# Patient Record
Sex: Female | Born: 1935
Health system: Southern US, Community
[De-identification: ages and names within clinical notes are randomized; demographics above are authoritative.]

## PROBLEM LIST (undated history)

## (undated) DIAGNOSIS — I739 Peripheral vascular disease, unspecified: Secondary | ICD-10-CM

## (undated) DIAGNOSIS — I509 Heart failure, unspecified: Secondary | ICD-10-CM

## (undated) DIAGNOSIS — I499 Cardiac arrhythmia, unspecified: Secondary | ICD-10-CM

## (undated) DIAGNOSIS — E119 Type 2 diabetes mellitus without complications: Secondary | ICD-10-CM

## (undated) DIAGNOSIS — I10 Essential (primary) hypertension: Secondary | ICD-10-CM

## (undated) DIAGNOSIS — H9190 Unspecified hearing loss, unspecified ear: Secondary | ICD-10-CM

## (undated) DIAGNOSIS — I82409 Acute embolism and thrombosis of unspecified deep veins of unspecified lower extremity: Secondary | ICD-10-CM

## (undated) DIAGNOSIS — T8859XA Other complications of anesthesia, initial encounter: Secondary | ICD-10-CM

## (undated) HISTORY — PX: SHOULDER SURGERY: SHX246

## (undated) HISTORY — DX: Acute embolism and thrombosis of unspecified deep veins of unspecified lower extremity: I82.409

## (undated) HISTORY — DX: Heart failure, unspecified: I50.9

## (undated) HISTORY — DX: Cardiac arrhythmia, unspecified: I49.9

## (undated) HISTORY — DX: Peripheral vascular disease, unspecified: I73.9

## (undated) HISTORY — PX: KNEE SURGERY: SHX244

---

## 2016-03-28 LAB — COLOGUARD

## 2017-12-20 ENCOUNTER — Encounter: Payer: Self-pay | Admitting: Emergency Medicine

## 2017-12-20 DIAGNOSIS — Z87891 Personal history of nicotine dependence: Secondary | ICD-10-CM | POA: Diagnosis not present

## 2017-12-20 DIAGNOSIS — Y929 Unspecified place or not applicable: Secondary | ICD-10-CM | POA: Diagnosis not present

## 2017-12-20 DIAGNOSIS — S0101XA Laceration without foreign body of scalp, initial encounter: Secondary | ICD-10-CM | POA: Insufficient documentation

## 2017-12-20 DIAGNOSIS — Y999 Unspecified external cause status: Secondary | ICD-10-CM | POA: Diagnosis not present

## 2017-12-20 DIAGNOSIS — I10 Essential (primary) hypertension: Secondary | ICD-10-CM | POA: Diagnosis not present

## 2017-12-20 DIAGNOSIS — Y9301 Activity, walking, marching and hiking: Secondary | ICD-10-CM | POA: Diagnosis not present

## 2017-12-20 DIAGNOSIS — S0990XA Unspecified injury of head, initial encounter: Secondary | ICD-10-CM | POA: Diagnosis present

## 2017-12-20 DIAGNOSIS — E119 Type 2 diabetes mellitus without complications: Secondary | ICD-10-CM | POA: Insufficient documentation

## 2017-12-20 DIAGNOSIS — W0110XA Fall on same level from slipping, tripping and stumbling with subsequent striking against unspecified object, initial encounter: Secondary | ICD-10-CM | POA: Insufficient documentation

## 2017-12-20 NOTE — ED Triage Notes (Signed)
Patient fell today  ~7pm by tripping over something in the bathroom.  Patient has hematoma on left side of head that bleeding is controlled at this time.  Pt denies headache.  Her neighbor is a Marine scientist that put a steri-strip on her wound and told her to go to the ER if it started bleeding again.  Around 10pm it started bleeding and they are here for evaluation.

## 2017-12-21 ENCOUNTER — Encounter: Payer: Self-pay | Admitting: Emergency Medicine

## 2017-12-21 ENCOUNTER — Emergency Department
Admission: EM | Admit: 2017-12-21 | Discharge: 2017-12-21 | Disposition: A | Discharge status: Home / Self Care | Payer: Medicare Other | Attending: Emergency Medicine | Admitting: Emergency Medicine

## 2017-12-21 DIAGNOSIS — S0990XA Unspecified injury of head, initial encounter: Secondary | ICD-10-CM

## 2017-12-21 DIAGNOSIS — S0101XA Laceration without foreign body of scalp, initial encounter: Secondary | ICD-10-CM

## 2017-12-21 DIAGNOSIS — W19XXXA Unspecified fall, initial encounter: Secondary | ICD-10-CM

## 2017-12-21 HISTORY — DX: Essential (primary) hypertension: I10

## 2017-12-21 HISTORY — DX: Type 2 diabetes mellitus without complications: E11.9

## 2017-12-21 HISTORY — DX: Unspecified hearing loss, unspecified ear: H91.90

## 2017-12-21 NOTE — Discharge Instructions (Signed)

## 2017-12-21 NOTE — ED Provider Notes (Signed)
[facility] Emergency Department Provider Note  ____________________________________________   First MD Initiated Contact with Patient 12/21/17 0038     (approximate)  I have reviewed the triage vital signs and the nursing notes.   HISTORY  Chief Complaint Fall    HPI Heidi Whitaker is a 83 y.o. female who is generally healthy for her age with no cardiac history and on no anticoagulation who fall about 6 hours ago that resulted in a small laceration on her scalp.  She is not certain about the exact circumstances of the fall, but it seemed to be a mechanical fall in the bathroom while she was getting up from the toilet.  She denies losing consciousness.  She does not have a headache or neck pain, and had some initial bleeding from the laceration that was fixed when a neighbor put a Steri-Strip on.  However after it started bleeding again her family brought her for evaluation.  The patient has been ambulatory without difficulty, eating and drinking, and denies any symptoms specifically including chest pain, shortness of breath, nausea, vomiting, abdominal pain, headache, changes, and neck pain.  They only came in because the wound started to bleed again.  Symptoms are mild, onset was acute, and it occurred about 6 hours ago.  Nothing in particular makes the symptoms better nor worse.  Past Medical History:  Diagnosis Date  . Diabetes mellitus without complication ([place])   . Hard of hearing   . Hypertension     There are no active problems to display for this patient.   Past Surgical History:  Procedure Laterality Date  . CESAREAN SECTION    . KNEE SURGERY    . SHOULDER SURGERY      Prior to Admission medications   Not on File    Allergies Patient has no known allergies.  History reviewed. No pertinent family history.  Social History Social History   Tobacco Use  . Smoking status: Former Research scientist (life sciences)  . Smokeless tobacco: Never Used  Substance  Use Topics  . Alcohol use: No    Frequency: Never  . Drug use: No    Review of Systems Constitutional: No fever/chills Eyes: No visual changes. Cardiovascular: Denies chest pain. Respiratory: Denies shortness of breath. Gastrointestinal: No abdominal pain.  No nausea, no vomiting.   Musculoskeletal: Slight pain in scalp but no global headache.  Negative for neck pain.  Negative for back pain. Integumentary: Small laceration to left side of scalp Neurological: Negative for headaches, focal weakness or numbness.   ____________________________________________   PHYSICAL EXAM:  VITAL SIGNS: ED Triage Vitals [12/20/17 2323]  Enc Vitals Group     BP (!) 139/57     Pulse Rate (!) 58     Resp 18     Temp (!) 97.5 F (36.4 C)     Temp Source Oral     SpO2 95 %     Weight      Height      Head Circumference      Peak Flow      Pain Score 0     Pain Loc      Pain Edu?      Excl. in [place]?     Constitutional: Alert and oriented. Well appearing and in no acute distress. Eyes: Conjunctivae are normal.  Head: 1 cm superficial laceration to the left side of the crown of her scalp.  Using a small amount of blood with a slight surrounding hematoma, minimal tenderness to  palpation. Nose: No congestion/rhinnorhea. Neck: No stridor.  No meningeal signs.  No cervical spine tenderness to palpation. Cardiovascular: Normal rate, regular rhythm. Good peripheral circulation.  Respiratory: Normal respiratory effort.  No retractions.  Musculoskeletal: No evidence of acute injury to her extremities, no gross deformities, ambulating without difficulty Neurologic:  Normal speech and language. No gross focal neurologic deficits are appreciated.  Skin:  Skin is warm, dry and intact except for laceration as described in head exam above Psychiatric: Mood and affect are normal. Speech and behavior are normal.  ____________________________________________   LABS (all labs ordered are listed, but only  abnormal results are displayed)  Labs Reviewed - No data to display ____________________________________________  EKG  None - EKG not ordered by ED physician ____________________________________________  RADIOLOGY   No results found.  ____________________________________________   PROCEDURES  Critical Care performed: No   Procedure(s) performed:   Marland KitchenMarland KitchenLaceration Repair Date/Time: 12/21/2017 12:59 AM Performed by: Hinda Kehr, MD Authorized by: Hinda Kehr, MD   Consent:    Consent obtained:  Verbal   Consent given by:  Patient Anesthesia (see MAR for exact dosages):    Anesthesia method:  None Laceration details:    Location:  Scalp   Scalp location:  Crown (left side of crown)   Length (cm):  1 Repair type:    Repair type:  Simple Exploration:    Contaminated: no   Treatment:    Area cleansed with:  Soap and water   Amount of cleaning:  Standard   Irrigation solution:  Tap water   Visualized foreign bodies/material removed: no   Skin repair:    Repair method:  Tissue adhesive Approximation:    Approximation:  Close Post-procedure details:    Dressing:  Open (no dressing)   Patient tolerance of procedure:  Tolerated well, no immediate complications     ____________________________________________   INITIAL IMPRESSION / ASSESSMENT AND PLAN / ED COURSE  As part of my medical decision making, I reviewed the following data within the [facility] notes reviewed and incorporated    Differential diagnosis includes, but is not limited to, acute intracranial injury/bleed secondary to her head, fall could be due to cardiogenic syncope, vasovagal episode, etc.  However the patient has been very well-appearing and asymptomatic for 6 hours since the fall.  She is on no anticoagulation and has no signs or symptoms of concussion with a GCS of 15.  The only reason they came in is because the wound continues to ooze some blood.  I cleaned  it off with soap and water, dried it, and applied some skin adhesive to try to keep it protected and from bleeding anymore.  We discussed head CT and/or cervical spine CT but I find no clinical reason to obtain any imaging and the patient and her family agree.  I gave my usual and customary return precautions.     ____________________________________________  FINAL CLINICAL IMPRESSION(S) / ED DIAGNOSES  Final diagnoses:  Fall, initial encounter  Minor head injury, initial encounter  Laceration of scalp, initial encounter     MEDICATIONS GIVEN DURING THIS VISIT:  Medications - No data to display   ED Discharge Orders    None       Note:  This document was prepared using Dragon voice recognition software and may include unintentional dictation errors.    Hinda Kehr, MD 12/21/17 [phone]

## 2017-12-21 NOTE — ED Notes (Signed)

## 2018-07-27 ENCOUNTER — Ambulatory Visit: Payer: Medicare Other | Admitting: Internal Medicine

## 2018-08-11 ENCOUNTER — Telehealth: Payer: Self-pay

## 2018-08-11 NOTE — Telephone Encounter (Signed)
Copied from [place] [id number]. Topic: Appointment Scheduling - Scheduling Inquiry for Clinic >> Aug 11, 2018 12:50 PM Margot Ables wrote: Pts daughter Reather Laurence MRN [id number] is wanting to trade her appt 08/12/18 w/Dr. Nicki Reaper. Ms. Draughon is scheduled to see Dr. Terese Door 09/30/18 as a new pt. The issue is the patient has 1 week left of her medications and her old doctor [place] has already "dismissed" her and "archived" her records. I did explain Dr. Nicki Reaper is not taking new pts. Jackelyn Poling is requesting call back from [place] to discuss. Call back # [phone].

## 2018-08-11 NOTE — Telephone Encounter (Signed)
Could she see the NP Lauren because we would need to get her medications in the chart and find out health history, really need to get more information as to what type of medications.

## 2018-08-12 ENCOUNTER — Encounter: Payer: Self-pay | Admitting: Family

## 2018-08-12 ENCOUNTER — Ambulatory Visit (INDEPENDENT_AMBULATORY_CARE_PROVIDER_SITE_OTHER): Payer: Medicare HMO | Admitting: Family

## 2018-08-12 VITALS — BP 146/64 | HR 69 | Temp 98.0°F | Resp 16 | Wt 181.5 lb

## 2018-08-12 DIAGNOSIS — R195 Other fecal abnormalities: Secondary | ICD-10-CM | POA: Diagnosis not present

## 2018-08-12 DIAGNOSIS — I10 Essential (primary) hypertension: Secondary | ICD-10-CM | POA: Diagnosis not present

## 2018-08-12 MED ORDER — METOPROLOL SUCCINATE ER 100 MG PO TB24: 100.0000 mg | ORAL_TABLET | Freq: Every day | ORAL | 1 refills | Status: DC

## 2018-08-12 NOTE — Assessment & Plan Note (Signed)
BP slightly elevated today.  We will go ahead and refill her metoprolol succinate.  She is establishing with Dr. Donia Pounds in October.  I advised her to continue to monitor her blood pressure and ensure that it is well controlled in the interim.

## 2018-08-12 NOTE — Assessment & Plan Note (Addendum)
Patient had records from prior PCP with her today; her prior PCP noted Cologuard positive in [place] . Patient declines GI referral today. Advised her to continue to have this conversation with Dr Aundra Dubin once she establishes in October 2019.

## 2018-08-12 NOTE — Progress Notes (Signed)
Subjective:    Patient ID: Heidi Whitaker, female    DOB: 1929/10/24, 84 y.o.   MRN: [id number]  CC: Heidi Whitaker is a 84 y.o. female who presents today to establish care.    HPI: Came from [place] and thought she had enough of all her prescriptions.  Had issue with [employer] and [employer].   HTN- Has 2 more weeks of metoprolol succinate ER 100 QD ; however which prompted visit today. She plans to establish care in our clinic in October.   Denies exertional chest pain or pressure, numbness or tingling radiating to left arm or jaw, palpitations, dizziness, frequent headaches, changes in vision, or shortness of breath.   DM- on glimepiride. Last a1c 7.6 05/2018.   Former smoker    No longer doing mammogram  Insomnia- takes trazodone as needed.   Cologuard positive 2017; declines GI referral.   CKD- crt 1.01 05/2018      HISTORY:  Past Medical History:  Diagnosis Date  . Diabetes mellitus without complication ([place])   . Hard of hearing   . Hypertension    Past Surgical History:  Procedure Laterality Date  . CESAREAN SECTION    . KNEE SURGERY    . SHOULDER SURGERY     No family history on file.  Allergies: Patient has no known allergies. Current Outpatient Medications on File Prior to Visit  Medication Sig Dispense Refill  . aspirin 81 MG chewable tablet Chew 81 mg by mouth daily.    . clobetasol cream (TEMOVATE) 0.05 % clobetasol 0.05 % topical cream    . glimepiride (AMARYL) 2 MG tablet Take 2 mg by mouth daily.    Marland Kitchen lisinopril (PRINIVIL,ZESTRIL) 20 MG tablet Take 10 mg by mouth daily.    Marland Kitchen lovastatin (MEVACOR) 20 MG tablet Take 20 mg by mouth daily.    . traZODone (DESYREL) 50 MG tablet Take 50 mg by mouth once.     No current facility-administered medications on file prior to visit.     Social History   Tobacco Use  . Smoking status: Former Research scientist (life sciences)  . Smokeless tobacco: Never Used  Substance Use Topics  . Alcohol use: No    Frequency: Never  .  Drug use: No    Review of Systems  Constitutional: Negative for chills and fever.  Respiratory: Negative for cough.   Cardiovascular: Negative for chest pain and palpitations.  Gastrointestinal: Negative for nausea and vomiting.      Objective:    BP (!) 146/64 (BP Location: Left Arm, Patient Position: Sitting, Cuff Size: Normal)   Pulse 69   Temp 98 F (36.7 C) (Oral)   Resp 16   Wt 181 lb 8 oz (82.3 kg)   SpO2 96%  BP Readings from Last 3 Encounters:  08/12/18 (!) 146/64  12/21/17 (!) 142/64   Wt Readings from Last 3 Encounters:  08/12/18 181 lb 8 oz (82.3 kg)    Physical Exam  Constitutional: She appears well-developed and well-nourished.  Eyes: Conjunctivae are normal.  Cardiovascular: Normal rate, regular rhythm, normal heart sounds and normal pulses.  Pulmonary/Chest: Effort normal and breath sounds normal. She has no wheezes. She has no rhonchi. She has no rales.  Neurological: She is alert.  Skin: Skin is warm and dry.  Psychiatric: She has a normal mood and affect. Her speech is normal and behavior is normal. Thought content normal.  Vitals reviewed.      Assessment & Plan:   Problem List Items Addressed This  Visit      Cardiovascular and Mediastinum   HTN (hypertension) - Primary    BP slightly elevated today.  We will go ahead and refill her metoprolol succinate.  She is establishing with Dr. Donia Pounds in October.  I advised her to continue to monitor her blood pressure and ensure that it is well controlled in the interim.       Relevant Medications   lisinopril (PRINIVIL,ZESTRIL) 20 MG tablet   lovastatin (MEVACOR) 20 MG tablet   aspirin 81 MG chewable tablet   metoprolol succinate (TOPROL-XL) 100 MG 24 hr tablet     Other   Positive colorectal cancer screening using Cologuard test    Patient had records from prior PCP with her today; her prior PCP noted Cologuard positive in [place] . Patient declines GI referral today. Advised her to continue  to have this conversation with Dr Aundra Dubin once she establishes in October 2019.           I have discontinued Orlean Borum's atenolol. I am also having her maintain her traZODone, glimepiride, lisinopril, lovastatin, clobetasol cream, aspirin, and metoprolol succinate.   Meds ordered this encounter  Medications  . DISCONTD: metoprolol succinate (TOPROL-XL) 100 MG 24 hr tablet    Sig: Take 1 tablet (100 mg total) by mouth daily. Take with or immediately following a meal.    Dispense:  90 tablet    Refill:  1  . metoprolol succinate (TOPROL-XL) 100 MG 24 hr tablet    Sig: Take 1 tablet (100 mg total) by mouth daily. Take with or immediately following a meal.    Dispense:  90 tablet    Refill:  1    Order Specific Question:   Supervising Provider    Answer:   Crecencio Mc [[id number]]    Return precautions given.   Risks, benefits, and alternatives of the medications and treatment plan prescribed today were discussed, and patient expressed understanding.   Education regarding symptom management and diagnosis given to patient on AVS.  Continue to follow with Burnard Hawthorne, FNP for routine health maintenance.   Heidi Whitaker and I agreed with plan.   Mable Paris, FNP

## 2018-08-12 NOTE — Patient Instructions (Signed)
Sent in metoprolol and also given you a paper copy  Please let me know if you need anything   Please also consider GI referral since your cologuard was positive

## 2018-08-17 NOTE — Progress Notes (Signed)
Called patient to follow up , she states she hasn't go blood pressure medication yet , it is not route.

## 2018-08-22 ENCOUNTER — Telehealth: Payer: Self-pay | Admitting: Family

## 2018-08-22 NOTE — Telephone Encounter (Signed)
Please call pt again Does she have toprol now?  How are BPs?

## 2018-08-22 NOTE — Telephone Encounter (Signed)
-----   Message from Johna Sheriff, [place] sent at 08/17/2018 12:10 PM EDT -----   ----- Message ----- From: Burnard Hawthorne, FNP Sent: 08/12/2018   4:14 PM EDT To: Johna Sheriff, [place] call next week- ensure pt has gotten metoprolol and also ask BP has been running. Ensure less than 140/90- if not, she needs sooner appt.

## 2018-09-06 NOTE — Telephone Encounter (Signed)
Spoke to daughter Jackelyn Poling patient did receive blood pressure medication. Daughter does have blood pressure machine to check blood pressure, however patient is refusing to let her check blood pressure. She has tried to explain the importance to her of monitoring blood pressure but she still refuses to let her check blood pressure. I informed daughter that I had called previously but patient didn't understand me .

## 2018-09-07 NOTE — Telephone Encounter (Signed)
Call daughter  Please ensure she comes to appt with [facility] in October  Reiterate checking BP; it was 146/64. Goal for her is less than 140/90 which she is close too however not quite  Ensure daughter aware of symptoms which are red flag and require emergency room or 911:  exertional chest pain or pressure, numbness or tingling radiating to left arm or jaw, palpitations, dizziness, frequent headaches, changes in vision, or shortness of breath.

## 2018-09-12 NOTE — Telephone Encounter (Signed)
Called and spoke with  Jackelyn Poling daughter per [facility] and advised for patient to keep appointment with Dr Aundra Dubin in October.  Patient is still refusing for her to check her blood pressure again advised her to keep trying to check blood pressure. Advised of symptoms red flag to go to ER as listed below.   Patients daughter Jackelyn Poling verbalized understanding.

## 2018-09-30 ENCOUNTER — Ambulatory Visit (INDEPENDENT_AMBULATORY_CARE_PROVIDER_SITE_OTHER): Payer: Medicare HMO | Admitting: Internal Medicine

## 2018-09-30 ENCOUNTER — Encounter: Payer: Self-pay | Admitting: Internal Medicine

## 2018-09-30 VITALS — BP 130/70 | HR 64 | Temp 98.6°F | Ht 68.0 in | Wt 181.1 lb

## 2018-09-30 DIAGNOSIS — K529 Noninfective gastroenteritis and colitis, unspecified: Secondary | ICD-10-CM | POA: Diagnosis not present

## 2018-09-30 DIAGNOSIS — Z23 Encounter for immunization: Secondary | ICD-10-CM

## 2018-09-30 DIAGNOSIS — H6123 Impacted cerumen, bilateral: Secondary | ICD-10-CM

## 2018-09-30 DIAGNOSIS — N183 Chronic kidney disease, stage 3 unspecified: Secondary | ICD-10-CM

## 2018-09-30 DIAGNOSIS — R040 Epistaxis: Secondary | ICD-10-CM | POA: Insufficient documentation

## 2018-09-30 DIAGNOSIS — R269 Unspecified abnormalities of gait and mobility: Secondary | ICD-10-CM | POA: Diagnosis not present

## 2018-09-30 DIAGNOSIS — E785 Hyperlipidemia, unspecified: Secondary | ICD-10-CM | POA: Diagnosis not present

## 2018-09-30 DIAGNOSIS — Z1159 Encounter for screening for other viral diseases: Secondary | ICD-10-CM

## 2018-09-30 DIAGNOSIS — E1122 Type 2 diabetes mellitus with diabetic chronic kidney disease: Secondary | ICD-10-CM | POA: Insufficient documentation

## 2018-09-30 DIAGNOSIS — Z0184 Encounter for antibody response examination: Secondary | ICD-10-CM

## 2018-09-30 DIAGNOSIS — G4700 Insomnia, unspecified: Secondary | ICD-10-CM | POA: Insufficient documentation

## 2018-09-30 DIAGNOSIS — L309 Dermatitis, unspecified: Secondary | ICD-10-CM | POA: Diagnosis not present

## 2018-09-30 DIAGNOSIS — I10 Essential (primary) hypertension: Secondary | ICD-10-CM | POA: Diagnosis not present

## 2018-09-30 DIAGNOSIS — D72829 Elevated white blood cell count, unspecified: Secondary | ICD-10-CM

## 2018-09-30 DIAGNOSIS — Z974 Presence of external hearing-aid: Secondary | ICD-10-CM | POA: Insufficient documentation

## 2018-09-30 DIAGNOSIS — E559 Vitamin D deficiency, unspecified: Secondary | ICD-10-CM

## 2018-09-30 DIAGNOSIS — Z1329 Encounter for screening for other suspected endocrine disorder: Secondary | ICD-10-CM

## 2018-09-30 MED ORDER — LISINOPRIL 10 MG PO TABS: 10.0000 mg | ORAL_TABLET | Freq: Every day | ORAL | 3 refills | Status: DC

## 2018-09-30 MED ORDER — CLOBETASOL PROPIONATE 0.05 % EX CREA: TOPICAL_CREAM | Freq: Two times a day (BID) | CUTANEOUS | 0 refills | Status: DC

## 2018-09-30 MED ORDER — CARBAMIDE PEROXIDE 6.5 % OT SOLN: 5.0000 [drp] | Freq: Two times a day (BID) | OTIC | 11 refills | Status: DC

## 2018-09-30 MED ORDER — TRAZODONE HCL 50 MG PO TABS: 50.0000 mg | ORAL_TABLET | Freq: Every evening | ORAL | 3 refills | Status: DC | PRN

## 2018-09-30 MED ORDER — METOPROLOL SUCCINATE ER 100 MG PO TB24: 100.0000 mg | ORAL_TABLET | Freq: Every day | ORAL | 3 refills | Status: DC

## 2018-09-30 MED ORDER — GLIMEPIRIDE 2 MG PO TABS: 2.0000 mg | ORAL_TABLET | Freq: Every day | ORAL | 3 refills | Status: DC

## 2018-09-30 MED ORDER — LOVASTATIN 20 MG PO TABS: 20.0000 mg | ORAL_TABLET | Freq: Every day | ORAL | 3 refills | Status: DC

## 2018-09-30 NOTE — Progress Notes (Signed)
Chief Complaint  Patient presents with  . Transitions Of Care   TOC moved from [place] lives with daughter and her husband Gust Rung   1. DM 2 A1C 6.7 05/31/18 on amaryl 2 mg qd  2. HTN/hld controlled on lis 10 mg qd, Toprol XL 100 mg qd mecavor 20 mg qhs  3 c/o nosebleeds x 2 recently nothing tried 4. Cerumen impaction b/l ears and wearing hearing aids both ears  5. Chronic diarrhea x 2 years 3-4 x per day brown loose and stool leakage. She has tried prn imodium with some relief at times w/u 12/2017 neg Cdiff and stool culture and O&P pt declines to see GI for now   Review of Systems  Constitutional: Negative for weight loss.  HENT: Positive for hearing loss and nosebleeds.        +ear wax b/l ears    Eyes: Negative for blurred vision.  Respiratory: Negative for shortness of breath.   Cardiovascular: Negative for chest pain.  Gastrointestinal: Positive for diarrhea.  Skin: Negative for rash.  Neurological: Negative for headaches.  Psychiatric/Behavioral: Positive for memory loss. Negative for depression.   Past Medical History:  Diagnosis Date  . Diabetes mellitus without complication ([place])   . Hard of hearing   . Hypertension    Past Surgical History:  Procedure Laterality Date  . CESAREAN SECTION    . KNEE SURGERY     left 2017/2018  . SHOULDER SURGERY     right   History reviewed. No pertinent family history. Social History   Socioeconomic History  . Marital status: Widowed    Spouse name: Not on file  . Number of children: Not on file  . Years of education: Not on file  . Highest education level: Not on file  Occupational History  . Not on file  Social Needs  . Financial resource strain: Not on file  . Food insecurity:    Worry: Not on file    Inability: Not on file  . Transportation needs:    Medical: Not on file    Non-medical: Not on file  Tobacco Use  . Smoking status: Former Research scientist (life sciences)  . Smokeless tobacco: Never Used  Substance and Sexual Activity  .  Alcohol use: No    Frequency: Never  . Drug use: No  . Sexual activity: Not on file  Lifestyle  . Physical activity:    Days per week: Not on file    Minutes per session: Not on file  . Stress: Not on file  Relationships  . Social connections:    Talks on phone: Not on file    Gets together: Not on file    Attends religious service: Not on file    Active member of club or organization: Not on file    Attends meetings of clubs or organizations: Not on file    Relationship status: Not on file  . Intimate partner violence:    Fear of current or ex partner: Not on file    Emotionally abused: Not on file    Physically abused: Not on file    Forced sexual activity: Not on file  Other Topics Concern  . Not on file  Social History Narrative   Retired Pharmacist, hospital    Current Meds  Medication Sig  . aspirin 81 MG chewable tablet Chew 81 mg by mouth daily.  . clobetasol cream (TEMOVATE) 0.05 % clobetasol 0.05 % topical cream  . glimepiride (AMARYL) 2 MG tablet Take 2 mg by  mouth daily.  Marland Kitchen lisinopril (PRINIVIL,ZESTRIL) 20 MG tablet Take 10 mg by mouth daily.  Marland Kitchen lovastatin (MEVACOR) 20 MG tablet Take 20 mg by mouth daily.  . metoprolol succinate (TOPROL-XL) 100 MG 24 hr tablet Take 1 tablet (100 mg total) by mouth daily. Take with or immediately following a meal.  . traZODone (DESYREL) 50 MG tablet Take 50 mg by mouth once.   No Known Allergies No results found for this or any previous visit (from the past 2160 hour(s)). Objective  Body mass index is 27.54 kg/m. Wt Readings from Last 3 Encounters:  09/30/18 181 lb 1.9 oz (82.2 kg)  08/12/18 181 lb 8 oz (82.3 kg)   Temp Readings from Last 3 Encounters:  09/30/18 98.6 F (37 C) (Oral)  08/12/18 98 F (36.7 C) (Oral)  12/20/17 (!) 97.5 F (36.4 C) (Oral)   BP Readings from Last 3 Encounters:  09/30/18 130/70  08/12/18 (!) 146/64  12/21/17 (!) 142/64   Pulse Readings from Last 3 Encounters:  09/30/18 64  08/12/18 69  12/21/17  80    Physical Exam  Constitutional: She is oriented to person, place, and time. Vital signs are normal. She appears well-developed and well-nourished. She is cooperative.  HENT:  Head: Normocephalic and atraumatic.  Mouth/Throat: Oropharynx is clear and moist and mucous membranes are normal.  B/l cerumen impaction   Eyes: Pupils are equal, round, and reactive to light. Conjunctivae are normal.  Cardiovascular: Normal rate, regular rhythm and normal heart sounds.  Pulmonary/Chest: Effort normal and breath sounds normal.  Neurological: She is alert and oriented to person, place, and time. Gait normal.  Skin: Skin is warm, dry and intact.  Psychiatric: She has a normal mood and affect. Her speech is normal and behavior is normal. Judgment and thought content normal. Cognition and memory are normal.  Nursing note and vitals reviewed.   Assessment   1. DM 2 A1C 6.7 05/31/18 on amaryl 2 mg qd  With CKD 3 Cr 1.0 and GFR 52.6 05/31/18 labs  2. HTN/HLD controlled on lis 10 mg qd  3 c/o nosebleeds x 2 recently  4. Cerumen impaction b/l ears and wearing hearing aids both ears  5. Chronic diarrhea infectious w/u neg 01/24/18 ? IBS D, lymphocytic colitis  6. HM Plan   1. sch fasting labs  Cont meds Eye exam due 5 or 05/2019  Check feet at f/u  on ACEI and statin  2. Cont meds  3. Disc ENT pt declines for now given info  4. Removed with currette b/l ear wax  Rx debrox drops b/l ears maintenance  5. Disc prn immodium  Will mail diet list and disc pre and probiotics  See letter from today with suggestions I.e increase fiber avoid lactose to see if helps  6.  sch fasting labs   Had flu shot today  utd prevnar pna 23 due 01/01/2019  Tdap and shingrix consider will disc in future again  Check MMR status  Never had colonoscopy cologuard + 03/28/16 see chart declined colonoscopy  No pap  mammo out of age window  dexa 01/21/14 normal   WBC 11.5 noted 05/31/18 labs   Reviewed CT ab/pelvis 02/11/18  b/l atrophic kidneys kidney right side and b/l kidney scarring, 1.3 cm gallstone, diverticulosis, aortic calcifications, MV calcification.   Rx rolling walker with seat and brakes today for abnormal gait  Provider: Dr. Olivia Mackie [facility]

## 2018-09-30 NOTE — Progress Notes (Signed)
Pre visit review using our clinic review tool, if applicable. No additional management support is needed unless otherwise documented below in the visit note. 

## 2018-09-30 NOTE — Patient Instructions (Addendum)
Nosebleed, Adult A nosebleed is when blood comes out of the nose. Nosebleeds are common. Usually, they are not a sign of a serious condition. Nosebleeds can happen if a small blood vessel in your nose starts to bleed or if the lining of your nose (mucous membrane) cracks. They are commonly caused by:  Allergies.  Colds.  Picking your nose.  Blowing your nose too hard.  An injury from sticking an object into your nose or getting hit in the nose.  Dry or cold air.  Less common causes of nosebleeds include:  Toxic fumes.  Something abnormal in the nose or in the air-filled spaces in the bones of the face (sinuses).  Growths in the nose, such as polyps.  Medicines or conditions that cause blood to clot slowly.  Certain illnesses or procedures that irritate or dry out the nasal passages.  Follow these instructions at home: When you have a nosebleed:  Sit down and tilt your head slightly forward.  Use a clean towel or tissue to pinch your nostrils under the bony part of your nose. After 10 minutes, let go of your nose and see if bleeding starts again. Do not release pressure before that time. If there is still bleeding, repeat the pinching and holding for 10 minutes until the bleeding stops.  Do not place tissues or gauze in the nose to stop bleeding.  Avoid lying down and avoid tilting your head backward. That may make blood collect in the throat and cause gagging or coughing.  Use a nasal spray decongestant to help with a nosebleed as told by your health care provider.  Do not use petroleum jelly or mineral oil in your nose. It can drip into your lungs. After a nosebleed:  Avoid blowing your nose or sniffing for a number of hours.  Avoid straining, lifting, or bending at the waist for several days. You may resume other normal activities as you are able.  Use saline spray or a humidifier as told by your health care provider.  Aspirinand blood thinners make bleeding  more likely. If you are prescribed these medicines and you suffer from nosebleeds: ? Ask your health care provider if you should stop taking the medicines or if you should adjust the dose. ? Do not stop taking medicines that your health care provider has recommended unless told by your health care provider.  If your nosebleed was caused by dry mucous membranes, use over-the-counter saline nasal spray or gel. This will keep the mucous membranes moist and allow them to heal. If you must use a lubricant: ? Choose one that is water-soluble. ? Use only as much as you need and use it only as often as needed. ? Do not lie down until several hours after you use it. Contact a health care provider if:  You have a fever.  You get nosebleeds often or more often than usual.  You bruise very easily.  You have a nosebleed from having something stuck in your nose.  You have bleeding in your mouth.  You vomit or cough up brown material.  You have a nosebleed after you start a new medicine. Get help right away if:  You have a nosebleed after a fall or a head injury.  Your nosebleed does not go away after 20 minutes.  You feel dizzy or weak.  You have unusual bleeding from other parts of your body.  You have unusual bruising on other parts of your body.  You become sweaty.  You vomit blood. This information is not intended to replace advice given to you by your health care provider. Make sure you discuss any questions you have with your health care provider. Document Released: 09/23/2005 Document Revised: 08/13/2016 Document Reviewed: 06/30/2016 Elsevier Interactive Patient Education  2018 [place], Adult The ears produce a substance called earwax that helps keep bacteria out of the ear and protects the skin in the ear canal. Occasionally, earwax can build up in the ear and cause discomfort or hearing loss. What increases the risk? This condition is more likely to  develop in people who:  Are female.  Are elderly.  Naturally produce more earwax.  Clean their ears often with cotton swabs.  Use earplugs often.  Use in-ear headphones often.  Wear hearing aids.  Have narrow ear canals.  Have earwax that is overly thick or sticky.  Have eczema.  Are dehydrated.  Have excess hair in the ear canal.  What are the signs or symptoms? Symptoms of this condition include:  Reduced or muffled hearing.  A feeling of fullness in the ear or feeling that the ear is plugged.  Fluid coming from the ear.  Ear pain.  Ear itch.  Ringing in the ear.  Coughing.  An obvious piece of earwax that can be seen inside the ear canal.  How is this diagnosed? This condition may be diagnosed based on:  Your symptoms.  Your medical history.  An ear exam. During the exam, your health care provider will look into your ear with an instrument called an otoscope.  You may have tests, including a hearing test. How is this treated? This condition may be treated by:  Using ear drops to soften the earwax.  Having the earwax removed by a health care provider. The health care provider may: ? Flush the ear with water. ? Use an instrument that has a loop on the end (curette). ? Use a suction device.  Surgery to remove the wax buildup. This may be done in severe cases.  Follow these instructions at home:  Take over-the-counter and prescription medicines only as told by your health care provider.  Do not put any objects, including cotton swabs, into your ear. You can clean the opening of your ear canal with a washcloth or facial tissue.  Follow instructions from your health care provider about cleaning your ears. Do not over-clean your ears.  Drink enough fluid to keep your urine clear or pale yellow. This will help to thin the earwax.  Keep all follow-up visits as told by your health care provider. If earwax builds up in your ears often or if you use  hearing aids, consider seeing your health care provider for routine, preventive ear cleanings. Ask your health care provider how often you should schedule your cleanings.  If you have hearing aids, clean them according to instructions from the manufacturer and your health care provider. Contact a health care provider if:  You have ear pain.  You develop a fever.  You have blood, pus, or other fluid coming from your ear.  You have hearing loss.  You have ringing in your ears that does not go away.  Your symptoms do not improve with treatment.  You feel like the room is spinning (vertigo). Summary  Earwax can build up in the ear and cause discomfort or hearing loss.  The most common symptoms of this condition include reduced or muffled hearing and a feeling of fullness in the ear  or feeling that the ear is plugged.  This condition may be diagnosed based on your symptoms, your medical history, and an ear exam.  This condition may be treated by using ear drops to soften the earwax or by having the earwax removed by a health care provider.  Do not put any objects, including cotton swabs, into your ear. You can clean the opening of your ear canal with a washcloth or facial tissue. This information is not intended to replace advice given to you by your health care provider. Make sure you discuss any questions you have with your health care provider. Document Released: 01/21/2005 Document Revised: 02/24/2017 Document Reviewed: 02/24/2017 Elsevier Interactive Patient Education  [employer].

## 2018-10-04 ENCOUNTER — Other Ambulatory Visit (INDEPENDENT_AMBULATORY_CARE_PROVIDER_SITE_OTHER): Payer: Medicare HMO

## 2018-10-04 DIAGNOSIS — I10 Essential (primary) hypertension: Secondary | ICD-10-CM | POA: Diagnosis not present

## 2018-10-04 DIAGNOSIS — E559 Vitamin D deficiency, unspecified: Secondary | ICD-10-CM | POA: Diagnosis not present

## 2018-10-04 DIAGNOSIS — N183 Chronic kidney disease, stage 3 (moderate): Secondary | ICD-10-CM

## 2018-10-04 DIAGNOSIS — E1122 Type 2 diabetes mellitus with diabetic chronic kidney disease: Secondary | ICD-10-CM | POA: Diagnosis not present

## 2018-10-04 DIAGNOSIS — Z1159 Encounter for screening for other viral diseases: Secondary | ICD-10-CM | POA: Diagnosis not present

## 2018-10-04 DIAGNOSIS — D72829 Elevated white blood cell count, unspecified: Secondary | ICD-10-CM | POA: Diagnosis not present

## 2018-10-04 DIAGNOSIS — K529 Noninfective gastroenteritis and colitis, unspecified: Secondary | ICD-10-CM

## 2018-10-04 DIAGNOSIS — Z1329 Encounter for screening for other suspected endocrine disorder: Secondary | ICD-10-CM

## 2018-10-04 DIAGNOSIS — Z0184 Encounter for antibody response examination: Secondary | ICD-10-CM

## 2018-10-04 LAB — LIPID PANEL
Cholesterol: 124 mg/dL (ref 0–200)
HDL: 37.6 mg/dL — AB (ref >39.00)
LDL Cholesterol: 63 mg/dL (ref 0–99)
NonHDL: 86.89
Total CHOL/HDL Ratio: 3
Triglycerides: 119 mg/dL (ref 0.0–149.0)
VLDL: 23.8 mg/dL (ref 0.0–40.0)

## 2018-10-04 LAB — COMPREHENSIVE METABOLIC PANEL
ALT: 6 U/L (ref 0–35)
AST: 12 U/L (ref 0–37)
Albumin: 3.7 g/dL (ref 3.5–5.2)
Alkaline Phosphatase: 46 U/L (ref 39–117)
BUN: 20 mg/dL (ref 6–23)
CHLORIDE: 106 meq/L (ref 96–112)
CO2: 25 meq/L (ref 19–32)
CREATININE: 0.99 mg/dL (ref 0.40–1.20)
Calcium: 9.4 mg/dL (ref 8.4–10.5)
GFR: 56.42 mL/min — ABNORMAL LOW (ref >60.00)
GLUCOSE: 126 mg/dL — AB (ref 70–99)
Potassium: 4 mEq/L (ref 3.5–5.1)
SODIUM: 140 meq/L (ref 135–145)
Total Bilirubin: 0.6 mg/dL (ref 0.2–1.2)
Total Protein: 6.8 g/dL (ref 6.0–8.3)

## 2018-10-04 LAB — VITAMIN D 25 HYDROXY (VIT D DEFICIENCY, FRACTURES): VITD: 25.72 ng/mL — ABNORMAL LOW (ref 30.00–100.00)

## 2018-10-04 LAB — CBC WITH DIFFERENTIAL/PLATELET
BASOS ABS: 0.1 10*3/uL (ref 0.0–0.1)
Basophils Relative: 0.5 % (ref 0.0–3.0)
Eosinophils Absolute: 1.3 10*3/uL — ABNORMAL HIGH (ref 0.0–0.7)
Eosinophils Relative: 12.1 % — ABNORMAL HIGH (ref 0.0–5.0)
HCT: 38 % (ref 36.0–46.0)
HEMOGLOBIN: 12.8 g/dL (ref 12.0–15.0)
Lymphocytes Relative: 32.8 % (ref 12.0–46.0)
Lymphs Abs: 3.5 10*3/uL (ref 0.7–4.0)
MCHC: 33.6 g/dL (ref 30.0–36.0)
MCV: 89.6 fl (ref 78.0–100.0)
MONO ABS: 1.3 10*3/uL — AB (ref 0.1–1.0)
Monocytes Relative: 11.8 % (ref 3.0–12.0)
NEUTROS ABS: 4.6 10*3/uL (ref 1.4–7.7)
Neutrophils Relative %: 42.8 % — ABNORMAL LOW (ref 43.0–77.0)
PLATELETS: 278 10*3/uL (ref 150.0–400.0)
RBC: 4.25 Mil/uL (ref 3.87–5.11)
RDW: 14 % (ref 11.5–15.5)
WBC: 10.8 10*3/uL — AB (ref 4.0–10.5)

## 2018-10-04 LAB — TSH: TSH: 3.27 u[IU]/mL (ref 0.35–4.50)

## 2018-10-04 LAB — HEMOGLOBIN A1C: HEMOGLOBIN A1C: 6.6 % — AB (ref 4.6–6.5)

## 2018-10-05 LAB — MICROALBUMIN / CREATININE URINE RATIO
Creatinine, Urine: 90 mg/dL (ref 20–275)
MICROALB UR: 6 mg/dL
MICROALB/CREAT RATIO: 67 ug/mg{creat} — AB (ref <30)

## 2018-10-05 LAB — URINALYSIS, ROUTINE W REFLEX MICROSCOPIC
BACTERIA UA: NONE SEEN /HPF
Bilirubin Urine: NEGATIVE
Glucose, UA: NEGATIVE
HYALINE CAST: NONE SEEN /LPF
KETONES UR: NEGATIVE
Nitrite: NEGATIVE
Specific Gravity, Urine: 1.012 (ref 1.001–1.03)

## 2018-10-05 LAB — MEASLES/MUMPS/RUBELLA IMMUNITY: Rubella: 22.4 index

## 2018-10-13 ENCOUNTER — Other Ambulatory Visit: Payer: Self-pay | Admitting: Internal Medicine

## 2018-10-13 DIAGNOSIS — R197 Diarrhea, unspecified: Secondary | ICD-10-CM

## 2018-10-13 DIAGNOSIS — K529 Noninfective gastroenteritis and colitis, unspecified: Secondary | ICD-10-CM

## 2018-10-13 MED ORDER — DIPHENOXYLATE-ATROPINE 2.5-0.025 MG PO TABS: 1.0000 | ORAL_TABLET | Freq: Four times a day (QID) | ORAL | 0 refills | Status: DC | PRN

## 2018-10-19 ENCOUNTER — Encounter: Payer: Self-pay | Admitting: *Deleted

## 2018-12-15 ENCOUNTER — Encounter: Payer: Self-pay | Admitting: Gastroenterology

## 2018-12-15 ENCOUNTER — Other Ambulatory Visit: Payer: Self-pay

## 2018-12-15 ENCOUNTER — Ambulatory Visit (INDEPENDENT_AMBULATORY_CARE_PROVIDER_SITE_OTHER): Payer: Medicare HMO | Admitting: Gastroenterology

## 2018-12-15 VITALS — BP 152/74 | HR 60 | Resp 17 | Ht 68.0 in | Wt 187.8 lb

## 2018-12-15 DIAGNOSIS — K529 Noninfective gastroenteritis and colitis, unspecified: Secondary | ICD-10-CM | POA: Diagnosis not present

## 2018-12-15 NOTE — Progress Notes (Signed)
Heidi Darby, MD [address]  [place]  [place], [place] [address]  Main: [phone]  Fax: [phone]    Gastroenterology Consultation  Referring Provider:     McLean-Scocuzza, Olivia Mackie * Primary Care Physician:  McLean-Scocuzza, Nino Glow, MD Primary Gastroenterologist:  Dr. Cephas Whitaker Reason for Consultation:     Chronic diarrhea        HPI:   Heidi Whitaker is a 84 y.o. pleasant Caucasian female referred by Dr. Terese Door, Nino Glow, MD  for consultation & management of chronic diarrhea.  Patient is hard of hearing but relatively healthy with no major medical comorbidities, functionally independent and accompanied by her daughter today.  Patient reports approximately 2 years history of nonbloody diarrhea, 4-5 times daily, with episodes of fecal incontinence.  She denies rectal bleeding, hematochezia, abdominal pain or cramps, bloating or distention or weight loss.  She had stool studies including C. difficile, stool cultures and ova and parasites performed by her PCP in 12/2017 which were unremarkable.  Patient tried Imodium which did not help.  She was prescribed Lomotil and tried only 2/day as she was afraid of leading to constipation.  She never had a colonoscopy as she was not willing to undergo due to prep.  Patient is currently living with her daughter.  Diarrhea is significantly affecting her quality of life.  No evidence of anemia, CMP normal, TSH normal.  Hemoglobin A1c 6.6 Her daughter reported that she had a positive Cologuard test on 03/28/2016, and patient did not want to undergo colonoscopy at that time  NSAIDs: None  Antiplts/Anticoagulants/Anti thrombotics: None  GI Procedures: None  Past Medical History:  Diagnosis Date  . Diabetes mellitus without complication ([place])   . Hard of hearing   . Hypertension     Past Surgical History:  Procedure Laterality Date  . CESAREAN SECTION    . KNEE SURGERY     left 2017/2018  . SHOULDER SURGERY     right    Current Outpatient Medications:  .  aspirin 81 MG chewable tablet, Chew 81 mg by mouth daily., Disp: , Rfl:  .  carbamide peroxide (DEBROX) 6.5 % OTIC solution, Place 5 drops into both ears 2 (two) times daily. X 4-7 days prn (Patient not taking: Reported on 12/15/2018), Disp: 15 mL, Rfl: 11 .  clobetasol cream (TEMOVATE) 0.05 %, Apply topically 2 (two) times daily. Not face or underarms use as needed (Patient not taking: Reported on 12/15/2018), Disp: 90 g, Rfl: 0 .  diphenoxylate-atropine (LOMOTIL) 2.5-0.025 MG tablet, Take 1 tablet by mouth 4 (four) times daily as needed for diarrhea or loose stools., Disp: 40 tablet, Rfl: 0 .  glimepiride (AMARYL) 2 MG tablet, Take 1 tablet (2 mg total) by mouth daily with breakfast. (Patient not taking: Reported on 12/15/2018), Disp: 90 tablet, Rfl: 3 .  lisinopril (PRINIVIL,ZESTRIL) 10 MG tablet, Take 1 tablet (10 mg total) by mouth daily. Do not cut in 1/2 dose reduced to 10 mg daily, Disp: 90 tablet, Rfl: 3 .  lovastatin (MEVACOR) 20 MG tablet, Take 1 tablet (20 mg total) by mouth daily at 6 PM., Disp: 90 tablet, Rfl: 3 .  metoprolol succinate (TOPROL-XL) 100 MG 24 hr tablet, Take 1 tablet (100 mg total) by mouth daily. Take with or immediately following a meal., Disp: 90 tablet, Rfl: 3 .  traZODone (DESYREL) 50 MG tablet, Take 1 tablet (50 mg total) by mouth at bedtime as needed for sleep. (Patient not taking: Reported on 12/15/2018), Disp:  90 tablet, Rfl: 3  No family history on file.   Social History   Tobacco Use  . Smoking status: Former Research scientist (life sciences)  . Smokeless tobacco: Never Used  Substance Use Topics  . Alcohol use: No    Frequency: Never  . Drug use: No    Allergies as of 12/15/2018  . (No Known Allergies)    Review of Systems:    All systems reviewed and negative except where noted in HPI.   Physical Exam:  BP (!) 152/74 (BP Location: Left Arm, Patient Position: Sitting, Cuff Size: Large)   Pulse 60   Resp 17   Ht 5\' 8"   (1.727 m)   Wt 187 lb 12.8 oz (85.2 kg)   BMI 28.55 kg/m  No LMP recorded. Patient is postmenopausal.  General:   Alert,  Well-developed, well-nourished, pleasant and cooperative in NAD Head:  Normocephalic and atraumatic. Eyes:  Sclera clear, no icterus.   Conjunctiva pink. Ears:  Normal auditory acuity. Nose:  No deformity, discharge, or lesions. Mouth:  No deformity or lesions,oropharynx pink & moist. Neck:  Supple; no masses or thyromegaly. Lungs:  Respirations even and unlabored.  Clear throughout to auscultation.   No wheezes, crackles, or rhonchi. No acute distress. Heart:  Regular rate and rhythm; no murmurs, clicks, rubs, or gallops. Abdomen:  Normal bowel sounds. Soft, non-tender and non-distended without masses, hepatosplenomegaly or hernias noted.  No guarding or rebound tenderness.   Rectal: Not performed Msk:  Symmetrical without gross deformities. Good, equal movement & strength bilaterally. Pulses:  Normal pulses noted. Extremities:  No clubbing or edema.  No cyanosis. Neurologic:  Alert and oriented x3;  grossly normal neurologically. Skin:  Intact without significant lesions or rashes. No jaundice. Lymph Nodes:  No significant cervical adenopathy. Psych:  Alert and cooperative. Normal mood and affect.  Imaging Studies: No abdominal imaging  Assessment and Plan:   [name] is a 84 y.o. Caucasian female with no significant past medical history, functionally independent with 2 years history of nonbloody diarrhea with no other constitutional symptoms and no associated GI symptoms.  Differentials include microscopic colitis or irritable bowel syndrome or lactose intolerance or bacterial overgrowth or pancreatic insufficiency or less likely inflammatory bowel disease or overflow diarrhea from colonic obstruction.  I recommend patient to undergo EGD and colonoscopy.  Initially, patient was hesitant to undergo but I tried to convince her the significance of  colonoscopy to rule out colon cancer which sometimes can also lead to chronic diarrhea.  She finally agreed to undergo endoscopic evaluation.  Further work-up Check pancreatic fecal elastase, fecal calprotectin levels Check H. pylori breath test, TTG IgA, total IgA EGD and colonoscopy with biopsies  In the meantime, Encouraged her to increase Lomotil to 4 times daily and see if it helps with diarrhea Discontinue fiber as it has no role in management of chronic diarrhea Avoid dairy products Trial of probiotics   Follow up in 4-6 weeks after above work-up   Heidi Darby, MD

## 2018-12-16 LAB — TISSUE TRANSGLUTAMINASE, IGA: Transglutaminase IgA: <2 U/mL (ref 0–3)

## 2018-12-16 LAB — IGA: IgA/Immunoglobulin A, Serum: 116 mg/dL (ref 64–422)

## 2018-12-17 LAB — H. PYLORI BREATH TEST: H pylori Breath Test: NEGATIVE

## 2019-01-03 ENCOUNTER — Telehealth: Payer: Self-pay | Admitting: Gastroenterology

## 2019-01-03 NOTE — Telephone Encounter (Signed)
PT  Daughter left vm regarding pt upcoming procedure 01/09/19 pt is getting cold feet and they have some questions please call pt daughter

## 2019-01-03 NOTE — Telephone Encounter (Signed)
Spoke with pt's daughter and as of now pt will continue on with procedure, will call if patient changes her mind.

## 2019-01-04 ENCOUNTER — Telehealth: Payer: Self-pay | Admitting: Gastroenterology

## 2019-01-04 NOTE — Telephone Encounter (Signed)
[name] called & has questions about the diet for the colonoscopy scheduled for 01-09-2019. She has more questions like if she has to take her hearing aids out & dentures out at the time of the procedure? Please call today or tomorrow so they can cancel if the patient decides to cancel she doesn't have to pay the cancellation fee.

## 2019-01-09 ENCOUNTER — Encounter
Admission: RE | Disposition: A | Discharge status: Home / Self Care | Payer: Self-pay | Source: Home / Self Care | Attending: Gastroenterology

## 2019-01-09 ENCOUNTER — Ambulatory Visit: Payer: Medicare HMO | Admitting: Certified Registered"

## 2019-01-09 ENCOUNTER — Ambulatory Visit
Admission: RE | Admit: 2019-01-09 | Discharge: 2019-01-09 | Disposition: A | Discharge status: Home / Self Care | Payer: Medicare HMO | Attending: Gastroenterology | Admitting: Gastroenterology

## 2019-01-09 DIAGNOSIS — Z7984 Long term (current) use of oral hypoglycemic drugs: Secondary | ICD-10-CM | POA: Diagnosis not present

## 2019-01-09 DIAGNOSIS — L538 Other specified erythematous conditions: Secondary | ICD-10-CM | POA: Insufficient documentation

## 2019-01-09 DIAGNOSIS — K2950 Unspecified chronic gastritis without bleeding: Secondary | ICD-10-CM | POA: Diagnosis not present

## 2019-01-09 DIAGNOSIS — K5669 Other partial intestinal obstruction: Secondary | ICD-10-CM | POA: Diagnosis not present

## 2019-01-09 DIAGNOSIS — K6289 Other specified diseases of anus and rectum: Secondary | ICD-10-CM | POA: Diagnosis not present

## 2019-01-09 DIAGNOSIS — K5730 Diverticulosis of large intestine without perforation or abscess without bleeding: Secondary | ICD-10-CM | POA: Insufficient documentation

## 2019-01-09 DIAGNOSIS — I129 Hypertensive chronic kidney disease with stage 1 through stage 4 chronic kidney disease, or unspecified chronic kidney disease: Secondary | ICD-10-CM | POA: Diagnosis not present

## 2019-01-09 DIAGNOSIS — K648 Other hemorrhoids: Secondary | ICD-10-CM | POA: Insufficient documentation

## 2019-01-09 DIAGNOSIS — H9190 Unspecified hearing loss, unspecified ear: Secondary | ICD-10-CM | POA: Insufficient documentation

## 2019-01-09 DIAGNOSIS — E1122 Type 2 diabetes mellitus with diabetic chronic kidney disease: Secondary | ICD-10-CM | POA: Diagnosis not present

## 2019-01-09 DIAGNOSIS — K529 Noninfective gastroenteritis and colitis, unspecified: Secondary | ICD-10-CM | POA: Diagnosis not present

## 2019-01-09 DIAGNOSIS — I10 Essential (primary) hypertension: Secondary | ICD-10-CM | POA: Diagnosis not present

## 2019-01-09 DIAGNOSIS — Z79899 Other long term (current) drug therapy: Secondary | ICD-10-CM | POA: Insufficient documentation

## 2019-01-09 DIAGNOSIS — N189 Chronic kidney disease, unspecified: Secondary | ICD-10-CM | POA: Diagnosis not present

## 2019-01-09 DIAGNOSIS — D120 Benign neoplasm of cecum: Secondary | ICD-10-CM

## 2019-01-09 DIAGNOSIS — K228 Other specified diseases of esophagus: Secondary | ICD-10-CM | POA: Diagnosis not present

## 2019-01-09 DIAGNOSIS — Z87891 Personal history of nicotine dependence: Secondary | ICD-10-CM | POA: Insufficient documentation

## 2019-01-09 DIAGNOSIS — Z7982 Long term (current) use of aspirin: Secondary | ICD-10-CM | POA: Insufficient documentation

## 2019-01-09 DIAGNOSIS — D128 Benign neoplasm of rectum: Secondary | ICD-10-CM | POA: Insufficient documentation

## 2019-01-09 DIAGNOSIS — K449 Diaphragmatic hernia without obstruction or gangrene: Secondary | ICD-10-CM | POA: Diagnosis not present

## 2019-01-09 DIAGNOSIS — K3189 Other diseases of stomach and duodenum: Secondary | ICD-10-CM | POA: Diagnosis not present

## 2019-01-09 DIAGNOSIS — E119 Type 2 diabetes mellitus without complications: Secondary | ICD-10-CM | POA: Diagnosis not present

## 2019-01-09 DIAGNOSIS — E785 Hyperlipidemia, unspecified: Secondary | ICD-10-CM | POA: Diagnosis not present

## 2019-01-09 DIAGNOSIS — Z7689 Persons encountering health services in other specified circumstances: Secondary | ICD-10-CM | POA: Diagnosis not present

## 2019-01-09 DIAGNOSIS — K259 Gastric ulcer, unspecified as acute or chronic, without hemorrhage or perforation: Secondary | ICD-10-CM | POA: Insufficient documentation

## 2019-01-09 DIAGNOSIS — K644 Residual hemorrhoidal skin tags: Secondary | ICD-10-CM | POA: Diagnosis not present

## 2019-01-09 DIAGNOSIS — K317 Polyp of stomach and duodenum: Secondary | ICD-10-CM | POA: Diagnosis not present

## 2019-01-09 DIAGNOSIS — D490 Neoplasm of unspecified behavior of digestive system: Secondary | ICD-10-CM

## 2019-01-09 DIAGNOSIS — K5790 Diverticulosis of intestine, part unspecified, without perforation or abscess without bleeding: Secondary | ICD-10-CM | POA: Diagnosis not present

## 2019-01-09 HISTORY — PX: ESOPHAGOGASTRODUODENOSCOPY (EGD) WITH PROPOFOL: SHX5813

## 2019-01-09 HISTORY — PX: COLONOSCOPY WITH PROPOFOL: SHX5780

## 2019-01-09 LAB — GLUCOSE, CAPILLARY
Glucose-Capillary: 109 mg/dL — ABNORMAL HIGH (ref 70–99)
Glucose-Capillary: 105 mg/dL — ABNORMAL HIGH (ref 70–99)

## 2019-01-09 SURGERY — ESOPHAGOGASTRODUODENOSCOPY (EGD) WITH PROPOFOL
Anesthesia: General

## 2019-01-09 MED ORDER — PROPOFOL 10 MG/ML IV BOLUS
INTRAVENOUS | Status: DC | PRN
  Administered 2019-01-09: 30 mg via INTRAVENOUS
  Administered 2019-01-09: 40 mg via INTRAVENOUS
  Administered 2019-01-09: 30 mg via INTRAVENOUS

## 2019-01-09 MED ORDER — LIDOCAINE HCL (CARDIAC) PF 100 MG/5ML IV SOSY
PREFILLED_SYRINGE | INTRAVENOUS | Status: DC | PRN
  Administered 2019-01-09: 60 mg via INTRATRACHEAL

## 2019-01-09 MED ORDER — SPOT INK MARKER SYRINGE KIT
PACK | SUBMUCOSAL | Status: DC | PRN
  Administered 2019-01-09: 3 mL via SUBMUCOSAL

## 2019-01-09 MED ORDER — PHENYLEPHRINE HCL 10 MG/ML IJ SOLN
INTRAMUSCULAR | Status: DC | PRN
  Administered 2019-01-09: 100 ug via INTRAVENOUS
  Administered 2019-01-09: 50 ug via INTRAVENOUS

## 2019-01-09 MED ORDER — PROPOFOL 10 MG/ML IV BOLUS
INTRAVENOUS | Status: AC
  Filled 2019-01-09: qty 40

## 2019-01-09 MED ORDER — EPHEDRINE SULFATE 50 MG/ML IJ SOLN
INTRAMUSCULAR | Status: DC | PRN
  Administered 2019-01-09 (×3): 5 mg via INTRAVENOUS

## 2019-01-09 MED ORDER — LIDOCAINE HCL (PF) 2 % IJ SOLN
INTRAMUSCULAR | Status: AC
  Filled 2019-01-09: qty 10

## 2019-01-09 MED ORDER — EPHEDRINE SULFATE 50 MG/ML IJ SOLN
INTRAMUSCULAR | Status: AC
  Filled 2019-01-09: qty 1

## 2019-01-09 MED ORDER — SODIUM CHLORIDE 0.9 % IV SOLN
INTRAVENOUS | Status: DC
  Administered 2019-01-09: 1000 mL via INTRAVENOUS

## 2019-01-09 MED ORDER — PROPOFOL 500 MG/50ML IV EMUL
INTRAVENOUS | Status: DC | PRN
  Administered 2019-01-09: 100 ug/kg/min via INTRAVENOUS

## 2019-01-09 NOTE — Op Note (Signed)
[facility] Gastroenterology Patient Name: Heidi Whitaker Procedure Date: 01/09/2019 9:44 AM MRN: [id number] Account #: [id number] Date of Birth: 07/23/1929 Admit Type: Outpatient Age: 85 Room: [facility] ENDO ROOM 2 Gender: Female Note Status: Finalized Procedure:            Colonoscopy Indications:          This is the patient's first colonoscopy, , Chronic                        diarrhea, Clinically significant diarrhea of                        unexplained origin, Positive Cologuard test in 2017 Providers:            Lin Landsman MD, MD Referring MD:         Nino Glow Mclean-Scocuzza MD, MD (Referring MD) Medicines:            Monitored Anesthesia Care Complications:        No immediate complications. Estimated blood loss:                        Minimal. Procedure:            Pre-Anesthesia Assessment:                       - Prior to the procedure, a History and Physical was                        performed, and patient medications and allergies were                        reviewed. The patient is competent. The risks and                        benefits of the procedure and the sedation options and                        risks were discussed with the patient. All questions                        were answered and informed consent was obtained.                        Patient identification and proposed procedure were                        verified by the physician, the nurse, the                        anesthesiologist, the anesthetist and the technician in                        the pre-procedure area in the procedure room in the                        endoscopy suite. Mental Status Examination: alert and                        oriented. Airway Examination: normal oropharyngeal  airway and neck mobility. Respiratory Examination:                        clear to auscultation. CV Examination: normal.                        Prophylactic  Antibiotics: The patient does not require                        prophylactic antibiotics. Prior Anticoagulants: The                        patient has taken no previous anticoagulant or                        antiplatelet agents. ASA Grade Assessment: III - A                        patient with severe systemic disease. After reviewing                        the risks and benefits, the patient was deemed in                        satisfactory condition to undergo the procedure. The                        anesthesia plan was to use monitored anesthesia care                        (MAC). Immediately prior to administration of                        medications, the patient was re-assessed for adequacy                        to receive sedatives. The heart rate, respiratory rate,                        oxygen saturations, blood pressure, adequacy of                        pulmonary ventilation, and response to care were                        monitored throughout the procedure. The physical status                        of the patient was re-assessed after the procedure.                       After obtaining informed consent, the colonoscope was                        passed under direct vision. Throughout the procedure,                        the patient's blood pressure, pulse, and oxygen  saturations were monitored continuously. The                        Colonoscope was introduced through the anus and                        advanced to the the terminal ileum, with identification                        of the appendiceal orifice and IC valve. The                        colonoscopy was performed without difficulty. The                        patient tolerated the procedure well. The quality of                        the bowel preparation was adequate to identify polyps 6                        mm and larger in size. Findings:      The perianal exam findings include  erythematous skin, likely from yeast       infection.      The terminal ileum appeared normal.      A greater than 50 mm polyp was found in the cecum, occupying atleast 50%       of the circumference of the mucosal surface. The polyp was granular       lateral spreading. Polypectomy was not attempted due to polyp size (too       large to be excised). This lesion was not biopsied      A frond-like/villous and polypoid partially obstructing large mass was       found in the mid rectum, aboout 15cm from anal verge. The mass was       partially circumferential (involving two-thirds of the lumen       circumference). No bleeding was present. This was biopsied with a cold       forceps for histology. Area was tattooed with an injection of Spot       (carbon black).      Normal mucosa was found in the sigmoid colon, in the descending colon,       in the transverse colon and in the ascending colon. Biopsies for       histology were taken with a cold forceps for evaluation of microscopic       colitis.      Many diverticula were found in the sigmoid colon. Impression:           - Erythematous skin, likely from yeast infection found                        on perianal exam.                       - The examined portion of the ileum was normal.                       - One greater than 50 mm polyp in the cecum. Resection  not attempted.                       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 Biopsied. Tattooed.                       - Normal mucosa in the sigmoid colon, in the descending                        colon, in the transverse colon and in the ascending                        colon. Biopsied.                       - Diverticulosis in the sigmoid colon.                       - Non-bleeding external and internal hemorrhoids. Recommendation:       - Discharge patient to home (with escort).                       -  Resume previous diet today.                       - Continue present medications.                       - Await pathology results.                       - Referral to oncology pending path results                       - Refer to advanced endoscopist for EMR/ESD of the                        cecal polyp Procedure Code(s):    --- Professional ---                       [phone], Colonoscopy, flexible; with directed submucosal                        injection(s), any substance                       [id number], Colonoscopy, flexible; with biopsy, single or                        multiple Diagnosis Code(s):    --- Professional ---                       K64.8, Other hemorrhoids                       D12.0, Benign neoplasm of cecum                       K56.690, Other partial intestinal obstruction  D49.0, Neoplasm of unspecified behavior of digestive                        system                       K52.9, Noninfective gastroenteritis and colitis,                        unspecified                       R19.7, Diarrhea, unspecified                       R19.5, Other fecal abnormalities                       K57.30, Diverticulosis of large intestine without                        perforation or abscess without bleeding CPT copyright 2018 American Medical [employer]. All rights reserved. The codes documented in this report are preliminary and upon coder review may  be revised to meet current compliance requirements. Dr. Ulyess Mort Lin Landsman MD, MD 01/09/2019 10:50:03 AM This report has been signed electronically. Number of Addenda: 0 Note Initiated On: 01/09/2019 9:44 AM Scope Withdrawal Time: 0 hours 21 minutes 8 seconds  Total Procedure Duration: 0 hours 24 minutes 28 seconds       [facility]

## 2019-01-09 NOTE — H&P (Signed)
Cephas Darby, MD [address]  [place], [place] [address]  Main: [phone]  Fax: [phone] Pager: [phone]  Primary Care Physician:  McLean-Scocuzza, Nino Glow, MD Primary Gastroenterologist:  Dr. Cephas Darby  Pre-Procedure History & Physical: HPI:  Heidi Whitaker is a 85 y.o. female is here for an endoscopy and colonoscopy.   Past Medical History:  Diagnosis Date  . Diabetes mellitus without complication ([facility])   . Hard of hearing   . Hypertension     Past Surgical History:  Procedure Laterality Date  . CESAREAN SECTION    . KNEE SURGERY     left 2017/2018  . SHOULDER SURGERY     right    Prior to Admission medications   Medication Sig Start Date End Date Taking? Authorizing Provider  aspirin 81 MG chewable tablet Chew 81 mg by mouth daily.   Yes [provider]  carbamide peroxide (DEBROX) 6.5 % OTIC solution Place 5 drops into both ears 2 (two) times daily. X 4-7 days prn 09/30/18  Yes McLean-Scocuzza, Nino Glow, MD  clobetasol cream (TEMOVATE) 0.05 % Apply topically 2 (two) times daily. Not face or underarms use as needed 09/30/18  Yes McLean-Scocuzza, Nino Glow, MD  diphenoxylate-atropine (LOMOTIL) 2.5-0.025 MG tablet Take 1 tablet by mouth 4 (four) times daily as needed for diarrhea or loose stools. 10/13/18  Yes McLean-Scocuzza, Nino Glow, MD  glimepiride (AMARYL) 2 MG tablet Take 1 tablet (2 mg total) by mouth daily with breakfast. 09/30/18  Yes McLean-Scocuzza, Nino Glow, MD  lisinopril (PRINIVIL,ZESTRIL) 10 MG tablet Take 1 tablet (10 mg total) by mouth daily. Do not cut in 1/2 dose reduced to 10 mg daily 09/30/18  Yes McLean-Scocuzza, Nino Glow, MD  lovastatin (MEVACOR) 20 MG tablet Take 1 tablet (20 mg total) by mouth daily at 6 PM. 09/30/18  Yes McLean-Scocuzza, Nino Glow, MD  metoprolol succinate (TOPROL-XL) 100 MG 24 hr tablet Take 1 tablet (100 mg total) by mouth daily. Take with or immediately following a meal. 09/30/18  Yes  McLean-Scocuzza, Nino Glow, MD  traZODone (DESYREL) 50 MG tablet Take 1 tablet (50 mg total) by mouth at bedtime as needed for sleep. 09/30/18  Yes McLean-Scocuzza, Nino Glow, MD    Allergies as of 12/15/2018  . (No Known Allergies)    No family history on file.  Social History   Socioeconomic History  . Marital status: Widowed    Spouse name: Not on file  . Number of children: Not on file  . Years of education: Not on file  . Highest education level: Not on file  Occupational History  . Not on file  Social Needs  . Financial resource strain: Not on file  . Food insecurity:    Worry: Not on file    Inability: Not on file  . Transportation needs:    Medical: Not on file    Non-medical: Not on file  Tobacco Use  . Smoking status: Former Research scientist (life sciences)  . Smokeless tobacco: Never Used  Substance and Sexual Activity  . Alcohol use: No    Frequency: Never  . Drug use: No  . Sexual activity: Not on file  Lifestyle  . Physical activity:    Days per week: Not on file    Minutes per session: Not on file  . Stress: Not on file  Relationships  . Social connections:    Talks on phone: Not on file    Gets together: Not on file  Attends religious service: Not on file    Active member of club or organization: Not on file    Attends meetings of clubs or organizations: Not on file    Relationship status: Not on file  . Intimate partner violence:    Fear of current or ex partner: Not on file    Emotionally abused: Not on file    Physically abused: Not on file    Forced sexual activity: Not on file  Other Topics Concern  . Not on file  Social History Narrative   Retired Pharmacist, hospital    2 daughters lives with [name]    1 son deceased    Widowed    Moved from [place]: See HPI, otherwise negative ROS  Physical Exam: BP (!) 163/84   Pulse 63   Temp (!) 96.6 F (35.9 C) (Tympanic)   Resp 20   Ht 5\' 8"  (1.727 m)   Wt 81.6 kg   SpO2 96%   BMI 27.37 kg/m  General:    Alert,  pleasant and cooperative in NAD Head:  Normocephalic and atraumatic. Neck:  Supple; no masses or thyromegaly. Lungs:  Clear throughout to auscultation.    Heart:  Regular rate and rhythm. Abdomen:  Soft, nontender and nondistended. Normal bowel sounds, without guarding, and without rebound.   Neurologic:  Alert and  oriented x4;  grossly normal neurologically.  Impression/Plan: DARIAN ACE is here for an endoscopy and colonoscopy to be performed for chronic diarrhea  Risks, benefits, limitations, and alternatives regarding  endoscopy and colonoscopy have been reviewed with the patient.  Questions have been answered.  All parties agreeable.   Sherri Sear, MD  01/09/2019, 9:04 AM

## 2019-01-09 NOTE — Anesthesia Preprocedure Evaluation (Addendum)
Anesthesia Evaluation  Patient identified by MRN, date of birth, ID band Patient awake    Reviewed: Allergy & Precautions, H&P , NPO status , Patient's Chart, lab work & pertinent test results  Airway Mallampati: II       Dental  (+) Edentulous Lower, Edentulous Upper   Pulmonary neg pulmonary ROS, former smoker,           Cardiovascular hypertension,      Neuro/Psych negative neurological ROS  negative psych ROS   GI/Hepatic negative GI ROS, Neg liver ROS,   Endo/Other  diabetes  Renal/GU CRFRenal disease  negative genitourinary   Musculoskeletal   Abdominal   Peds  Hematology negative hematology ROS (+)   Anesthesia Other Findings Past Medical History: No date: Diabetes mellitus without complication ([facility]) No date: Hard of hearing No date: Hypertension  Past Surgical History: No date: CESAREAN SECTION No date: KNEE SURGERY     Comment:  left 2017/2018 No date: SHOULDER SURGERY     Comment:  right  BMI    Body Mass Index:  27.37 kg/m      Reproductive/Obstetrics negative OB ROS                            Anesthesia Physical Anesthesia Plan  ASA: III  Anesthesia Plan: General   Post-op Pain Management:    Induction:   PONV Risk Score and Plan: Propofol infusion and TIVA  Airway Management Planned: Natural Airway and Nasal Cannula  Additional Equipment:   Intra-op Plan:   Post-operative Plan:   Informed Consent: I have reviewed the patients History and Physical, chart, labs and discussed the procedure including the risks, benefits and alternatives for the proposed anesthesia with the patient or authorized representative who has indicated his/her understanding and acceptance.   Dental Advisory Given  Plan Discussed with: Anesthesiologist  Anesthesia Plan Comments:         Anesthesia Quick Evaluation

## 2019-01-09 NOTE — Transfer of Care (Signed)
Immediate Anesthesia Transfer of Care Note  Patient: Heidi Whitaker  Procedure(s) Performed: ESOPHAGOGASTRODUODENOSCOPY (EGD) WITH PROPOFOL (N/A ) COLONOSCOPY WITH PROPOFOL (N/A )  Patient Location: Endoscopy Unit  Anesthesia Type:General  Level of Consciousness: awake  Airway & Oxygen Therapy: Patient Spontanous Breathing and Patient connected to nasal cannula oxygen  Post-op Assessment: Report given to RN and Post -op Vital signs reviewed and stable  Post vital signs: stable  Last Vitals:  Vitals Value Taken Time  BP    Temp    Pulse 76 01/09/2019 10:51 AM  Resp 25 01/09/2019 10:51 AM  SpO2 97 % 01/09/2019 10:51 AM  Vitals shown include unvalidated device data.  Last Pain:  Vitals:   01/09/19 0859  TempSrc: Tympanic  PainSc: 0-No pain         Complications: No apparent anesthesia complications

## 2019-01-09 NOTE — Op Note (Signed)
[facility] Patient Name: Heidi Whitaker Procedure Date: 01/09/2019 9:45 AM MRN: [id number] Account #: [id number] Date of Birth: [birth year] Admit Type: Outpatient Age: 85 Room: [facility] ENDO ROOM 2 Gender: Female Note Status: Finalized Procedure:            Upper GI endoscopy Indications:          Diarrhea Providers:            Lin Landsman MD, MD Referring MD:         Nino Glow Mclean-Scocuzza MD, MD (Referring MD) Medicines:            Monitored Anesthesia Care Complications:        No immediate complications. Estimated blood loss:                        Minimal. Procedure:            Pre-Anesthesia Assessment:                       - Prior to the procedure, a History and Physical was                        performed, and patient medications and allergies were                        reviewed. The patient is competent. The risks and                        benefits of the procedure and the sedation options and                        risks were discussed with the patient. All questions                        were answered and informed consent was obtained.                        Patient identification and proposed procedure were                        verified by the physician, the nurse, the                        anesthesiologist, the anesthetist and the technician in                        the pre-procedure area in the procedure room in the                        endoscopy suite. Mental Status Examination: alert and                        oriented. Airway Examination: normal oropharyngeal                        airway and neck mobility. Respiratory Examination:                        clear to auscultation. CV Examination: normal.  Prophylactic Antibiotics: The patient does not require                        prophylactic antibiotics. Prior Anticoagulants: The                        patient has taken no previous  anticoagulant or                        antiplatelet agents. ASA Grade Assessment: III - A                        patient with severe systemic disease. After reviewing                        the risks and benefits, the patient was deemed in                        satisfactory condition to undergo the procedure. The                        anesthesia plan was to use monitored anesthesia care                        (MAC). Immediately prior to administration of                        medications, the patient was re-assessed for adequacy                        to receive sedatives. The heart rate, respiratory rate,                        oxygen saturations, blood pressure, adequacy of                        pulmonary ventilation, and response to care were                        monitored throughout the procedure. The physical status                        of the patient was re-assessed after the procedure.                       After obtaining informed consent, the endoscope was                        passed under direct vision. Throughout the procedure,                        the patient's blood pressure, pulse, and oxygen                        saturations were monitored continuously. The Endoscope                        was introduced through the mouth, and advanced to the  second part of duodenum. The upper GI endoscopy was                        accomplished without difficulty. The patient tolerated                        the procedure fairly well. Findings:      The duodenal bulb and second portion of the duodenum were normal.      A few dispersed, 5 mm non-bleeding erosions were found in the prepyloric       region of the stomach. There were no stigmata of recent bleeding. One       erosion had raised edges, likely granulation tissue, this was located in       prepylorus, on lesser curve. Biopsies were taken with a cold forceps for       histology.      The gastric  body was normal. Biopsies were taken from antrum and body       with a cold forceps for Helicobacter pylori testing.      The cardia and gastric fundus were normal on retroflexion.      A small hiatal hernia was present.      Esophagogastric landmarks were identified: the gastroesophageal junction       was found at 30 cm from the incisors.      Localized abnormal appearing raised mucosa was found right below the       gastroesophageal junction in the hernial sac. Biopsies were taken with a       cold forceps for histology.      The examined esophagus was otherwise normal. Impression:           - Normal duodenal bulb and second portion of the                        duodenum.                       - Non-bleeding erosive gastropathy. Biopsied.                       - Normal gastric body. Biopsied.                       - Small hiatal hernia.                       - Esophagogastric landmarks identified.                       - Mucosal changes in the esophagus. Biopsied.                       - Normal esophagus. Recommendation:       - Await pathology results.                       - Use Prilosec (omeprazole) 20 mg PO BID for 4 weeks.                       - Proceed with colonoscopy as scheduled                       See colonoscopy report Procedure Code(s):    ---  Professional ---                       [phone], Esophagogastroduodenoscopy, flexible, transoral;                        with biopsy, single or multiple Diagnosis Code(s):    --- Professional ---                       K31.89, Other diseases of stomach and duodenum                       K44.9, Diaphragmatic hernia without obstruction or                        gangrene                       K22.8, Other specified diseases of esophagus                       R19.7, Diarrhea, unspecified CPT copyright 2018 American Medical Association. All rights reserved. The codes documented in this report are preliminary and upon coder review may   be revised to meet current compliance requirements. Dr. Ulyess Mort Lin Landsman MD, MD 01/09/2019 10:13:54 AM This report has been signed electronically. Number of Addenda: 0 Note Initiated On: 01/09/2019 9:45 AM      [facility]

## 2019-01-09 NOTE — Anesthesia Post-op Follow-up Note (Signed)
Anesthesia QCDR form completed.        

## 2019-01-10 NOTE — Anesthesia Postprocedure Evaluation (Signed)
Anesthesia Post Note  Patient: Heidi Whitaker  Procedure(s) Performed: ESOPHAGOGASTRODUODENOSCOPY (EGD) WITH PROPOFOL (N/A ) COLONOSCOPY WITH PROPOFOL (N/A )  Patient location during evaluation: PACU Anesthesia Type: General Level of consciousness: awake and alert Pain management: pain level controlled Vital Signs Assessment: post-procedure vital signs reviewed and stable Respiratory status: spontaneous breathing, nonlabored ventilation and respiratory function stable Cardiovascular status: blood pressure returned to baseline and stable Postop Assessment: no apparent nausea or vomiting Anesthetic complications: no     Last Vitals:  Vitals:   01/09/19 1058 01/09/19 1130  BP: 124/83 (!) 153/86  Pulse:    Resp:    Temp:    SpO2:      Last Pain:  Vitals:   01/10/19 0907  TempSrc:   PainSc: 0-No pain                 Durenda Hurt

## 2019-01-11 ENCOUNTER — Encounter: Payer: Self-pay | Admitting: Gastroenterology

## 2019-01-11 ENCOUNTER — Other Ambulatory Visit: Payer: Self-pay

## 2019-01-11 ENCOUNTER — Telehealth: Payer: Self-pay | Admitting: Gastroenterology

## 2019-01-11 DIAGNOSIS — C20 Malignant neoplasm of rectum: Secondary | ICD-10-CM

## 2019-01-11 LAB — SURGICAL PATHOLOGY

## 2019-01-11 NOTE — Telephone Encounter (Signed)
Communicated pathology results of the upper endoscopy and colonoscopy with patient's daughter.  The large polypoid sessile lesion in the rectum that was biopsied came back as tubulovillous adenoma with no evidence of dysplasia or malignancy.  This lesion is partially obstructing the rectum and not amenable for endoscopic removal.  Increased risk for perforation and bleeding Rest of the biopsies were unremarkable  My recommendations are CT abdomen and pelvis with contrast Refer to colorectal surgery in [facility] to evaluate for resection of the rectal lesion as well as LSP in the cecum  [name] expressed understanding of the plan  Cephas Darby, MD [address] [place]  [place], [place] [address]  Main: [phone]  Fax: [phone] Pager: [phone]

## 2019-01-11 NOTE — Progress Notes (Signed)
CT has been ordered and pt and daughter has been notified and verbalized understanding.

## 2019-01-16 ENCOUNTER — Other Ambulatory Visit: Payer: Self-pay

## 2019-01-16 DIAGNOSIS — D490 Neoplasm of unspecified behavior of digestive system: Secondary | ICD-10-CM

## 2019-01-19 ENCOUNTER — Ambulatory Visit
Admission: RE | Admit: 2019-01-19 | Discharge: 2019-01-19 | Disposition: A | Discharge status: Home / Self Care | Payer: Medicare HMO | Source: Ambulatory Visit | Attending: Gastroenterology | Admitting: Gastroenterology

## 2019-01-19 DIAGNOSIS — C20 Malignant neoplasm of rectum: Secondary | ICD-10-CM | POA: Diagnosis not present

## 2019-01-19 DIAGNOSIS — K8020 Calculus of gallbladder without cholecystitis without obstruction: Secondary | ICD-10-CM | POA: Diagnosis not present

## 2019-01-19 DIAGNOSIS — K5730 Diverticulosis of large intestine without perforation or abscess without bleeding: Secondary | ICD-10-CM | POA: Diagnosis not present

## 2019-01-19 LAB — POCT I-STAT CREATININE: Creatinine, Ser: 1.1 mg/dL — ABNORMAL HIGH (ref 0.44–1.00)

## 2019-01-19 MED ORDER — IOPAMIDOL (ISOVUE-300) INJECTION 61%
85.0000 mL | Freq: Once | INTRAVENOUS | Status: AC | PRN
  Administered 2019-01-19: 85 mL via INTRAVENOUS

## 2019-01-20 ENCOUNTER — Other Ambulatory Visit: Payer: Self-pay

## 2019-01-24 DIAGNOSIS — K635 Polyp of colon: Secondary | ICD-10-CM | POA: Diagnosis not present

## 2019-01-24 DIAGNOSIS — D128 Benign neoplasm of rectum: Secondary | ICD-10-CM | POA: Diagnosis not present

## 2019-01-31 ENCOUNTER — Ambulatory Visit: Payer: Medicare HMO | Admitting: Gastroenterology

## 2019-02-09 ENCOUNTER — Ambulatory Visit: Payer: Medicare HMO | Admitting: Internal Medicine

## 2019-02-09 DIAGNOSIS — K635 Polyp of colon: Secondary | ICD-10-CM | POA: Diagnosis not present

## 2019-02-09 DIAGNOSIS — D128 Benign neoplasm of rectum: Secondary | ICD-10-CM | POA: Diagnosis not present

## 2019-02-12 ENCOUNTER — Other Ambulatory Visit: Payer: Self-pay | Admitting: Internal Medicine

## 2019-02-12 ENCOUNTER — Telehealth: Payer: Self-pay | Admitting: Internal Medicine

## 2019-02-12 ENCOUNTER — Encounter: Payer: Self-pay | Admitting: Internal Medicine

## 2019-02-12 DIAGNOSIS — I10 Essential (primary) hypertension: Secondary | ICD-10-CM

## 2019-02-12 DIAGNOSIS — N183 Chronic kidney disease, stage 3 (moderate): Principal | ICD-10-CM

## 2019-02-12 DIAGNOSIS — E1122 Type 2 diabetes mellitus with diabetic chronic kidney disease: Secondary | ICD-10-CM

## 2019-02-12 MED ORDER — LISINOPRIL 20 MG PO TABS: 20.0000 mg | ORAL_TABLET | Freq: Every day | ORAL | 1 refills | Status: DC

## 2019-02-12 NOTE — Telephone Encounter (Signed)
Please call patient increase lisinopril 10 mg to 20 mg daily her blood pressure has been elevated since Ive seen her.   She has 10 mg so can take 2 of 10 mg pills=20 mg  When she gets her new pill it will be for 20 mg and she will need to only take 1 pill   [place]

## 2019-02-13 DIAGNOSIS — K635 Polyp of colon: Secondary | ICD-10-CM | POA: Diagnosis not present

## 2019-02-13 DIAGNOSIS — D128 Benign neoplasm of rectum: Secondary | ICD-10-CM | POA: Diagnosis not present

## 2019-02-13 DIAGNOSIS — K580 Irritable bowel syndrome with diarrhea: Secondary | ICD-10-CM | POA: Diagnosis not present

## 2019-02-13 DIAGNOSIS — R159 Full incontinence of feces: Secondary | ICD-10-CM | POA: Diagnosis not present

## 2019-02-13 NOTE — Telephone Encounter (Signed)
[name] called and is advised of message below

## 2019-02-14 ENCOUNTER — Other Ambulatory Visit: Payer: Self-pay | Admitting: Surgery

## 2019-02-14 ENCOUNTER — Ambulatory Visit: Payer: Self-pay | Admitting: Surgery

## 2019-02-14 DIAGNOSIS — D120 Benign neoplasm of cecum: Principal | ICD-10-CM

## 2019-02-14 DIAGNOSIS — K635 Polyp of colon: Secondary | ICD-10-CM

## 2019-02-15 ENCOUNTER — Ambulatory Visit: Admission: RE | Admit: 2019-02-15 | Payer: Medicare HMO | Source: Ambulatory Visit

## 2019-02-15 DIAGNOSIS — E1122 Type 2 diabetes mellitus with diabetic chronic kidney disease: Secondary | ICD-10-CM | POA: Diagnosis not present

## 2019-02-15 DIAGNOSIS — I208 Other forms of angina pectoris: Secondary | ICD-10-CM | POA: Diagnosis not present

## 2019-02-15 DIAGNOSIS — E669 Obesity, unspecified: Secondary | ICD-10-CM | POA: Diagnosis not present

## 2019-02-15 DIAGNOSIS — N183 Chronic kidney disease, stage 3 (moderate): Secondary | ICD-10-CM | POA: Diagnosis not present

## 2019-02-15 DIAGNOSIS — Z6830 Body mass index (BMI) 30.0-30.9, adult: Secondary | ICD-10-CM | POA: Diagnosis not present

## 2019-02-15 DIAGNOSIS — E785 Hyperlipidemia, unspecified: Secondary | ICD-10-CM | POA: Diagnosis not present

## 2019-02-15 DIAGNOSIS — I10 Essential (primary) hypertension: Secondary | ICD-10-CM | POA: Diagnosis not present

## 2019-02-15 DIAGNOSIS — R0602 Shortness of breath: Secondary | ICD-10-CM | POA: Diagnosis not present

## 2019-02-15 DIAGNOSIS — Z01818 Encounter for other preprocedural examination: Secondary | ICD-10-CM | POA: Diagnosis not present

## 2019-02-16 ENCOUNTER — Ambulatory Visit
Admission: RE | Admit: 2019-02-16 | Discharge: 2019-02-16 | Disposition: A | Discharge status: Home / Self Care | Payer: Medicare HMO | Source: Ambulatory Visit | Attending: Surgery | Admitting: Surgery

## 2019-02-16 DIAGNOSIS — K6289 Other specified diseases of anus and rectum: Secondary | ICD-10-CM | POA: Diagnosis not present

## 2019-02-16 DIAGNOSIS — D120 Benign neoplasm of cecum: Secondary | ICD-10-CM | POA: Insufficient documentation

## 2019-02-16 DIAGNOSIS — K635 Polyp of colon: Secondary | ICD-10-CM

## 2019-02-16 DIAGNOSIS — C19 Malignant neoplasm of rectosigmoid junction: Secondary | ICD-10-CM | POA: Diagnosis not present

## 2019-02-16 MED ORDER — GADOBUTROL 1 MMOL/ML IV SOLN
8.0000 mL | Freq: Once | INTRAVENOUS | Status: AC | PRN
  Administered 2019-02-16: 8 mL via INTRAVENOUS

## 2019-02-22 DIAGNOSIS — I208 Other forms of angina pectoris: Secondary | ICD-10-CM | POA: Diagnosis not present

## 2019-02-22 DIAGNOSIS — R0602 Shortness of breath: Secondary | ICD-10-CM | POA: Diagnosis not present

## 2019-02-22 DIAGNOSIS — Z01818 Encounter for other preprocedural examination: Secondary | ICD-10-CM | POA: Diagnosis not present

## 2019-03-06 DIAGNOSIS — D128 Benign neoplasm of rectum: Secondary | ICD-10-CM | POA: Diagnosis not present

## 2019-03-10 NOTE — Progress Notes (Signed)
03-10-19 Please send surgery orders. Pt is scheduled for her PAT appointment on 03-13-19. Thank you.

## 2019-03-10 NOTE — Patient Instructions (Addendum)
Heidi Whitaker  03/10/2019   Your procedure is scheduled on: 03-15-19    Report to [facility] Main  Entrance    Report to Admitting at 6:30 AM    Call this number if you have problems the morning of surgery [phone]    Remember: DRINK 2 PRESURGERY ENSURE DRINKS THE NIGHT BEFORE SURGERY AT  1000 PM AND 1 PRESURGERY DRINK THE DAY OF THE PROCEDURE 3 HOURS PRIOR TO SCHEDULED SURGERY. NO SOLIDS AFTER MIDNIGHT THE DAY PRIOR TO THE SURGERY. NOTHING BY MOUTH EXCEPT CLEAR LIQUIDS UNTIL THREE HOURS PRIOR TO SCHEDULED SURGERY. PLEASE FINISH PRESURGERY ENSURE DRINK PER SURGEON ORDER 3 HOURS PRIOR TO SCHEDULED SURGERY TIME WHICH NEEDS TO BE COMPLETED AT 5:30 AM.    CLEAR LIQUID DIET   Foods Allowed                                                                     Foods Excluded  Coffee and tea, regular and decaf                             liquids that you cannot  Plain Jell-O in any flavor                                             see through such as: Fruit ices (not with fruit pulp)                                     milk, soups, orange juice  Iced Popsicles                                    All solid food Carbonated beverages, regular and diet                                    Cranberry, grape and apple juices Sports drinks like Gatorade Lightly seasoned clear broth or consume(fat free) Sugar, honey syrup  Sample Menu Breakfast                                Lunch                                     Supper Cranberry juice                    Beef broth                            Chicken broth Jell-O  Grape juice                           Apple juice Coffee or tea                        Jell-O                                      Popsicle                                                Coffee or tea                        Coffee or  tea  _____________________________________________________________________     BRUSH YOUR TEETH MORNING OF SURGERY AND RINSE YOUR MOUTH OUT, NO CHEWING GUM CANDY OR MINTS.     Take these medicines the morning of surgery with A SIP OF WATER: Metoprolol (Toprol XL)   DO NOT TAKE ANY DIABETIC MEDICATIONS DAY OF YOUR SURGERY                               You may not have any metal on your body including hair pins and              piercings  Do not wear jewelry, make-up, lotions, powders or perfumes, deodorant             Do not wear nail polish.  Do not shave  48 hours prior to surgery.           Do not bring valuables to the hospital. [place].  Contacts, dentures or bridgework may not be worn into surgery.  Leave suitcase in the car. After surgery it may be brought to your room.     Patients discharged the day of surgery will not be allowed to drive home. IF YOU ARE HAVING SURGERY AND GOING HOME THE SAME DAY, YOU MUST HAVE AN ADULT TO DRIVE YOU HOME AND BE WITH YOU FOR 24 HOURS. YOU MAY GO HOME BY TAXI OR UBER OR ORTHERWISE, BUT AN ADULT MUST ACCOMPANY YOU HOME AND STAY WITH YOU FOR 24 HOURS.    Special Instructions: Please follow your surgery prep, per your surgeon's instructions              Please read over the following fact sheets you were given: _____________________________________________________________________  How to Manage Your Diabetes Before and After Surgery  Why is it important to control my blood sugar before and after surgery? . Improving blood sugar levels before and after surgery helps healing and can limit problems. . A way of improving blood sugar control is eating a healthy diet by: o  Eating less sugar and carbohydrates o  Increasing activity/exercise o  Talking with your doctor about reaching your blood sugar goals . High blood sugars (greater than 180 mg/dL) can raise your risk of infections and slow  your recovery, so you will need to focus on controlling your diabetes during the weeks before  surgery. . Make sure that the doctor who takes care of your diabetes knows about your planned surgery including the date and location.  How do I manage my blood sugar before surgery? . Check your blood sugar at least 4 times a day, starting 2 days before surgery, to make sure that the level is not too high or low. o Check your blood sugar the morning of your surgery when you wake up and every 2 hours until you get to the Short Stay unit. . If your blood sugar is less than 70 mg/dL, you will need to treat for low blood sugar: o Do not take insulin. o Treat a low blood sugar (less than 70 mg/dL) with  cup of clear juice (cranberry or apple), 4 glucose tablets, OR glucose gel. o Recheck blood sugar in 15 minutes after treatment (to make sure it is greater than 70 mg/dL). If your blood sugar is not greater than 70 mg/dL on recheck, call [phone] for further instructions. . Report your blood sugar to the short stay nurse when you get to Short Stay.  . If you are admitted to the hospital after surgery: o Your blood sugar will be checked by the staff and you will probably be given insulin after surgery (instead of oral diabetes medicines) to make sure you have good blood sugar levels. o The goal for blood sugar control after surgery is 80-180 mg/dL.   WHAT DO I DO ABOUT MY DIABETES MEDICATION?  Marland Kitchen Do not take oral diabetes medicines (pills) the morning of surgery.  . THE DAY BEFORE SURGERY, take only your morning dose of Glimepiride       Reviewed and Endorsed by [facility] Patient Education Committee, August 2015           [facility] - Preparing for Surgery Before surgery, you can play an important role.  Because skin is not sterile, your skin needs to be as free of germs as possible.  You can reduce the number of germs on your skin by washing with CHG (chlorahexidine gluconate) soap before  surgery.  CHG is an antiseptic cleaner which kills germs and bonds with the skin to continue killing germs even after washing. Please DO NOT use if you have an allergy to CHG or antibacterial soaps.  If your skin becomes reddened/irritated stop using the CHG and inform your nurse when you arrive at Short Stay. Do not shave (including legs and underarms) for at least 48 hours prior to the first CHG shower.  You may shave your face/neck. Please follow these instructions carefully:  1.  Shower with CHG Soap the night before surgery and the  morning of Surgery.  2.  If you choose to wash your hair, wash your hair first as usual with your  normal  shampoo.  3.  After you shampoo, rinse your hair and body thoroughly to remove the  shampoo.                           4.  Use CHG as you would any other liquid soap.  You can apply chg directly  to the skin and wash                       Gently with a scrungie or clean washcloth.  5.  Apply the CHG Soap to your body ONLY FROM THE NECK DOWN.   Do not use on face/ open  Wound or open sores. Avoid contact with eyes, ears mouth and genitals (private parts).                       Wash face,  Genitals (private parts) with your normal soap.             6.  Wash thoroughly, paying special attention to the area where your surgery  will be performed.  7.  Thoroughly rinse your body with warm water from the neck down.  8.  DO NOT shower/wash with your normal soap after using and rinsing off  the CHG Soap.                9.  Pat yourself dry with a clean towel.            10.  Wear clean pajamas.            11.  Place clean sheets on your bed the night of your first shower and do not  sleep with pets. Day of Surgery : Do not apply any lotions/deodorants the morning of surgery.  Please wear clean clothes to the hospital/surgery center.  FAILURE TO FOLLOW THESE INSTRUCTIONS MAY RESULT IN THE CANCELLATION OF YOUR SURGERY PATIENT  SIGNATURE_________________________________  NURSE SIGNATURE__________________________________  ________________________________________________________________________  WHAT IS A BLOOD TRANSFUSION? Blood Transfusion Information  A transfusion is the replacement of blood or some of its parts. Blood is made up of multiple cells which provide different functions.  Red blood cells carry oxygen and are used for blood loss replacement.  White blood cells fight against infection.  Platelets control bleeding.  Plasma helps clot blood.  Other blood products are available for specialized needs, such as hemophilia or other clotting disorders. BEFORE THE TRANSFUSION  Who gives blood for transfusions?   Healthy volunteers who are fully evaluated to make sure their blood is safe. This is blood bank blood. Transfusion therapy is the safest it has ever been in the practice of medicine. Before blood is taken from a donor, a complete history is taken to make sure that person has no history of diseases nor engages in risky social behavior (examples are intravenous drug use or sexual activity with multiple partners). The donor's travel history is screened to minimize risk of transmitting infections, such as malaria. The donated blood is tested for signs of infectious diseases, such as HIV and hepatitis. The blood is then tested to be sure it is compatible with you in order to minimize the chance of a transfusion reaction. If you or a relative donates blood, this is often done in anticipation of surgery and is not appropriate for emergency situations. It takes many days to process the donated blood. RISKS AND COMPLICATIONS Although transfusion therapy is very safe and saves many lives, the main dangers of transfusion include:   Getting an infectious disease.  Developing a transfusion reaction. This is an allergic reaction to something in the blood you were given. Every precaution is taken to prevent  this. The decision to have a blood transfusion has been considered carefully by your caregiver before blood is given. Blood is not given unless the benefits outweigh the risks. AFTER THE TRANSFUSION  Right after receiving a blood transfusion, you will usually feel much better and more energetic. This is especially true if your red blood cells have gotten low (anemic). The transfusion raises the level of the red blood cells which carry oxygen, and this usually causes an energy increase.  The nurse administering the transfusion will monitor you carefully for complications. HOME CARE INSTRUCTIONS  No special instructions are needed after a transfusion. You may find your energy is better. Speak with your caregiver about any limitations on activity for underlying diseases you may have. SEEK MEDICAL CARE IF:   Your condition is not improving after your transfusion.  You develop redness or irritation at the intravenous (IV) site. SEEK IMMEDIATE MEDICAL CARE IF:  Any of the following symptoms occur over the next 12 hours:  Shaking chills.  You have a temperature by mouth above 102 F (38.9 C), not controlled by medicine.  Chest, back, or muscle pain.  People around you feel you are not acting correctly or are confused.  Shortness of breath or difficulty breathing.  Dizziness and fainting.  You get a rash or develop hives.  You have a decrease in urine output.  Your urine turns a dark color or changes to pink, red, or brown. Any of the following symptoms occur over the next 10 days:  You have a temperature by mouth above 102 F (38.9 C), not controlled by medicine.  Shortness of breath.  Weakness after normal activity.  The white part of the eye turns yellow (jaundice).  You have a decrease in the amount of urine or are urinating less often.  Your urine turns a dark color or changes to pink, red, or brown. Document Released: 12/11/2000 Document Revised: 03/07/2012 Document  Reviewed: 07/30/2008 [facility] Patient Information 2014 [place], [place].  _______________________________________________________________________

## 2019-03-13 ENCOUNTER — Encounter (HOSPITAL_COMMUNITY)
Admission: RE | Admit: 2019-03-13 | Discharge: 2019-03-13 | Disposition: A | Discharge status: Home / Self Care | Payer: Medicare HMO | Source: Ambulatory Visit | Attending: Surgery | Admitting: Surgery

## 2019-03-13 ENCOUNTER — Encounter (HOSPITAL_COMMUNITY): Payer: Self-pay

## 2019-03-13 ENCOUNTER — Other Ambulatory Visit: Payer: Self-pay

## 2019-03-13 ENCOUNTER — Ambulatory Visit: Payer: Self-pay | Admitting: General Surgery

## 2019-03-13 DIAGNOSIS — Z01818 Encounter for other preprocedural examination: Secondary | ICD-10-CM | POA: Insufficient documentation

## 2019-03-13 DIAGNOSIS — I10 Essential (primary) hypertension: Secondary | ICD-10-CM | POA: Insufficient documentation

## 2019-03-13 LAB — COMPREHENSIVE METABOLIC PANEL
ALT: 12 U/L (ref 0–44)
AST: 19 U/L (ref 15–41)
Albumin: 4 g/dL (ref 3.5–5.0)
Alkaline Phosphatase: 46 U/L (ref 38–126)
Anion gap: 7 (ref 5–15)
BUN: 22 mg/dL (ref 8–23)
CHLORIDE: 105 mmol/L (ref 98–111)
CO2: 26 mmol/L (ref 22–32)
Calcium: 9.6 mg/dL (ref 8.9–10.3)
Creatinine, Ser: 1.06 mg/dL — ABNORMAL HIGH (ref 0.44–1.00)
GFR calc Af Amer: 55 mL/min — ABNORMAL LOW (ref >60)
GFR calc non Af Amer: 47 mL/min — ABNORMAL LOW (ref >60)
Glucose, Bld: 99 mg/dL (ref 70–99)
Potassium: 4.6 mmol/L (ref 3.5–5.1)
Sodium: 138 mmol/L (ref 135–145)
Total Bilirubin: 0.9 mg/dL (ref 0.3–1.2)
Total Protein: 7.4 g/dL (ref 6.5–8.1)

## 2019-03-13 LAB — CBC WITH DIFFERENTIAL/PLATELET
Abs Immature Granulocytes: 0.04 10*3/uL (ref 0.00–0.07)
Basophils Absolute: 0.1 10*3/uL (ref 0.0–0.1)
Basophils Relative: 0 %
Eosinophils Absolute: 0.6 10*3/uL — ABNORMAL HIGH (ref 0.0–0.5)
Eosinophils Relative: 5 %
HCT: 44.7 % (ref 36.0–46.0)
Hemoglobin: 13.7 g/dL (ref 12.0–15.0)
Immature Granulocytes: 0 %
Lymphocytes Relative: 29 %
Lymphs Abs: 3.5 10*3/uL (ref 0.7–4.0)
MCH: 29.2 pg (ref 26.0–34.0)
MCHC: 30.6 g/dL (ref 30.0–36.0)
MCV: 95.3 fL (ref 80.0–100.0)
MONOS PCT: 12 %
Monocytes Absolute: 1.4 10*3/uL — ABNORMAL HIGH (ref 0.1–1.0)
NEUTROS PCT: 54 %
Neutro Abs: 6.3 10*3/uL (ref 1.7–7.7)
Platelets: 280 10*3/uL (ref 150–400)
RBC: 4.69 MIL/uL (ref 3.87–5.11)
RDW: 13.9 % (ref 11.5–15.5)
WBC: 11.8 10*3/uL — ABNORMAL HIGH (ref 4.0–10.5)
nRBC: 0 % (ref 0.0–0.2)

## 2019-03-13 LAB — HEMOGLOBIN A1C
Hgb A1c MFr Bld: 6.6 % — ABNORMAL HIGH (ref 4.8–5.6)
Mean Plasma Glucose: 142.72 mg/dL

## 2019-03-13 LAB — GLUCOSE, CAPILLARY: Glucose-Capillary: 90 mg/dL (ref 70–99)

## 2019-03-13 LAB — ABO/RH: ABO/RH(D): O POS

## 2019-03-13 NOTE — Progress Notes (Addendum)
03/01/19 Cardiac Clearance on chart From Dr. [name]  02-15-19 EKG on chart  02-12-19 Surgical Clearance from [place] on chart

## 2019-03-13 NOTE — Progress Notes (Addendum)
PCP: Karlyn Agee Scocuzza  CARDIOLOGIST: Lujean Amel  INFO IN Epic: Current Labs  INFO ON CHART:  03/01/19 Cardiac Clearance on chart From Dr. [name]  02-15-19 EKG on chart  02-12-19 Surgical Clearance from [place] on chart  BLOOD THINNERS AND LAST DOSES: ____________________________________  PATIENT SYMPTOMS AT TIME OF PREOP:  Hx of HTN, DM, L BBB

## 2019-03-15 ENCOUNTER — Encounter (HOSPITAL_COMMUNITY): Admission: RE | Payer: Self-pay | Source: Home / Self Care

## 2019-03-15 ENCOUNTER — Inpatient Hospital Stay (HOSPITAL_COMMUNITY): Admission: RE | Admit: 2019-03-15 | Payer: Medicare HMO | Source: Home / Self Care | Admitting: Surgery

## 2019-03-15 LAB — TYPE AND SCREEN
ABO/RH(D): O POS
Antibody Screen: NEGATIVE

## 2019-03-15 SURGERY — RESECTION, RECTUM, LOW ANTERIOR, ROBOT-ASSISTED
Anesthesia: General

## 2019-04-25 ENCOUNTER — Ambulatory Visit: Payer: Medicare HMO | Admitting: Internal Medicine

## 2019-05-10 ENCOUNTER — Ambulatory Visit: Payer: Self-pay | Admitting: Surgery

## 2019-05-10 NOTE — Patient Instructions (Signed)
Heidi Whitaker   Your procedure is scheduled on: 05/17/19  Report to [facility] Main  Entrance Report to admitting at 9:00 AM   YOU NEED TO HAVE A COVID 19 TEST ON_5/15/20______,  THIS TEST MUST BE DONE BEFORE SURGERY, COME TO [place] ENTRANCE BETWEEN THE HOURS OF 900 AM AND 300 PM ON YOUR COVID TEST DATE.   Call this number if you have problems the morning of surgery [phone]   NO SOLID FOOD AFTER MIDNIGHT THE NIGHT PRIOR TO SURGERY.  NOTHING BY MOUTH EXCEPT CLEAR LIQUIDS UNTIL 3 HOURS PRIOR TO [place] SURGERY.  PLEASE FINISH G2  DRINK PER SURGEON ORDER 3 HOURS PRIOR TO SCHEDULED SURGERY TIME WHICH NEEDS TO BE COMPLETED AT 06:00 ____________. Marland Kitchen BRUSH YOUR TEETH MORNING OF SURGERY AND RINSE YOUR MOUTH OUT, NO CHEWING GUM CANDY OR MINTS.     Take these medicines the morning of surgery with A SIP OF WATER: Topprol [facility] - Preparing for Surgery Before surgery, you can play an important role.  Because skin is not sterile, your skin needs to be as free of germs as possible.  You can reduce the number of germs on your skin by washing with CHG (chlorahexidine gluconate) soap before surgery.  CHG is an antiseptic cleaner which kills germs and bonds with the skin to continue killing germs even after washing. Please DO NOT use if you have an allergy to CHG or antibacterial soaps.  If your skin becomes reddened/irritated stop using the CHG and inform your nurse when you arrive at Short Stay. Do not shave (including legs and underarms) for at least 48 hours prior to the first CHG shower.  You may shave your face/neck. Please follow these instructions carefully:  1.  Shower with CHG Soap the night before surgery and the  morning of Surgery.  2.  If you choose to wash your hair, wash your hair first as usual with your  normal  shampoo.  3.  After you shampoo, rinse your hair and body thoroughly to remove the  shampoo.                            4.  Use CHG as you would any other liquid soap.  You can apply chg directly  to the skin and wash                       Gently with a scrungie or clean washcloth.  5.  Apply the CHG Soap to your body ONLY FROM THE NECK DOWN.   Do not use on face/ open                           Wound or open sores. Avoid contact with eyes, ears mouth and genitals (private parts).                       Wash face,  Genitals (private parts) with your normal soap.             6.  Wash thoroughly, paying special attention to the area where your surgery  will be performed.  7.  Thoroughly rinse your body with warm water from the neck down.  8.  DO NOT shower/wash with your normal soap after using  and rinsing off  the CHG Soap.                9.  Pat yourself dry with a clean towel.            10.  Wear clean pajamas.            11.  Place clean sheets on your bed the night of your first shower and do not  sleep with pets. Day of Surgery : Do not apply any lotions/deodorants the morning of surgery.  Please wear clean clothes to the hospital/surgery center.  FAILURE TO FOLLOW THESE INSTRUCTIONS MAY RESULT IN THE CANCELLATION OF YOUR SURGERY PATIENT SIGNATURE_________________________________  NURSE SIGNATURE__________________________________  ________________________________________________________________________                                 Dennis Bast may not have any metal on your body including hair pins and              piercings  Do not wear jewelry, make-up, lotions, powders or perfumes, deodorant             Do not wear nail polish.  Do not shave  48 hours prior to surgery.              Men may shave face and neck.   Do not bring valuables to the hospital. [place].  Contacts, dentures or bridgework may not be worn into surgery.  Leave suitcase in the car. After surgery it may be brought to your room.     Patients discharged the day of surgery  will not be allowed to drive home. IF YOU ARE HAVING SURGERY AND GOING HOME THE SAME DAY, YOU MUST HAVE AN ADULT TO DRIVE YOU HOME AND BE WITH YOU FOR 24 HOURS. YOU MAY GO HOME BY TAXI OR UBER OR ORTHERWISE, BUT AN ADULT MUST ACCOMPANY YOU HOME AND STAY WITH YOU FOR 24 HOURS.  Name and phone number of your driver:  Special Instructions: N/A              Please read over the following fact sheets you were given: _____________________________________________________________________

## 2019-05-10 NOTE — H&P (Signed)
CC: Referred by Dr. Marius Ditch for endoscopically unresectable polyps in cecum and rectum.  HPI: Heidi Whitaker is a very pleasant 70yoF with hx of HTN, HLD, DM whom presented to Dr. Marius Ditch for evaluation of chronic diarrhea physical for the past couple of years. She also had a history of a positive cologuard test in 2017. She underwent colonoscopy on 01/09/2019 which was notable for: 1. Endoscopically unresectable polyp in the cecum-this was not biopsied or tattooed 2. endoscopically unresectable polyp in the rectum which was frond-like/villous and polypoid. This was read as being 15 cm from the anal verge and partially circumferential. this was however tattooed and biopsied. pathology returned tubulovillous adenoma without dysplasia or malignancy identified.  She that underwent CT scans of the abdomen and pelviswhich did not visualize the lesion seen in her colon and rectum. Diverticulosis noted. Gallstones noted.  Aside from diarrhea, she denies any symptoms. She has no history of abdominal pain, nausea, vomiting. She does note that her stool has been mucousy for the past couple of years. Her diarrhea also began suddenly approximately 2-3 years ago. She has no history of diarrhea prior to all this.  INTERVAL HX She obtained cardiac clearance. She underwent MRI pelvis for this rectal polypoid mass. This demonstrated findings of probable invasion of the muscularis propria and was read as possible T3N0/1 rectosigmoid mass. Given these findings in the distance up in the rectum at the rectosigmoid, there were concerns for doing a transanal excision. Potentially, the need for intraperitoneal involvement. This was discussed with my partner, Dr. Johney Maine, who is also present for her evaluation today. We presented her case at multidisciplinary tumor board conference. The consensus recommendation is to proceed with low anterior resection and given a cecal polyp, segmental colectomy of the sigmoid colon as  well. She returns today for follow-up. She denies any complaints today. She reports doing well. She is here today with her daughter  PMH: HTN (well controlled on oral antihypertensive); HLD (well controlled on statin), DM (well controlled on oral hypoglycemics)  PSH: C-section x3 via low midline incisions.  FHx: Denies FHx of malignancy  Social: Denies use of tobacco/EtOH/drugs. She is retired.  ROS: A comprehensive 10 system review of systems was completed with the patient and pertinent findings as noted above.  The patient is a 85 year old female.   Allergies Heidi Whitaker, [place]; 03/06/2019 9:35 AM) No Known Drug Allergies [01/24/2019]: Allergies Reconciled   Medication History Heidi Whitaker, CMA; 03/06/2019 9:35 AM) Metoprolol Succinate ER (100MG  Tablet ER 24HR, Oral) Active. Aspirin (81MG  Tablet, Oral) Active. Carbamide Peroxide (6.5% Solution, Otic) Active. Clobetasol Propionate (0.05% Cream, External) Active. Lomotil (2.5-0.025MG  Tablet, Oral) Active. Glimepiride (2MG  Tablet, Oral) Active. Lovastatin (20MG  Tablet, Oral) Active. Metoprolol Succinate (100MG  Tablet ER, Oral) Active. Desyrel (50MG  Tablet, Oral) Active. Lisinopril (20MG  Tablet, Oral) Active. Medications Reconciled    Review of Systems Heidi Gave M. Shonda Mandarino MD; 03/06/2019 10:17 AM) General Not Present- Chills and Fever. Skin Not Present- Coarse Hair and Cracked Lips. Respiratory Not Present- Decreased Exercise Tolerance and Dyspnea. Cardiovascular Not Present- Chest Pain and Shortness of Breath. Gastrointestinal Present- Chronic diarrhea. Not Present- Abdominal Pain, Nausea and Vomiting. Neurological Not Present- Decreased Memory and Difficulty Speaking. Psychiatric Not Present- Anxiety and Depression. Endocrine Not Present- Appetite Changes and Cold Intolerance. Hematology Not Present- Blood Clots and Blood Thinners. All other systems negative  Vitals Heidi Whitaker CMA; 03/06/2019 9:35  AM) 03/06/2019 9:35 AM Weight: 191.8 lb Height: 68in Body Surface Area: 2.01 m Body Mass Index: 29.16 kg/m  Temp.: 21F  Pulse: 68 (Regular)  BP: 160/70 (Sitting, Left Arm, Standard)       Physical Exam Heidi Gave M. Tinika Bucknam MD; 03/06/2019 10:18 AM) The physical exam findings are as follows: Note:Constitutional: No acute distress; conversant; no deformities Eyes: Moist conjunctiva; no lid lag; anicteric sclerae; pupils equal round and reactive to light Neck: Trachea midline; no palpable thyromegaly Lungs: Normal respiratory effort; no tactile fremitus CV: Regular rate and rhythm; no palpable thrill; no pitting edema GI: Abdomen soft, nontender, nondistended; no palpable hepatosplenomegaly Psychiatric: Appropriate affect; alert and oriented 3 Lymphatic: No palpable cervical or axillary lymphadenopathy  General Mental Status-Alert. General Appearance-Cooperative. Orientation-Oriented X4. Posture-Normal posture.  Integumentary Global Assessment Upon inspection and palpation of skin surfaces of the - Head/Face: no rashes, ulcers, lesions or evidence of photo damage. No palpable nodules or masses and Neck: no visible lesions or palpable masses.  Head and Neck Head-normocephalic, atraumatic with no lesions or palpable masses. Face Global Assessment - atraumatic. Thyroid Gland Characteristics - normal size and consistency.  Eye Eyeball - Bilateral-Extraocular movements intact. Sclera/Conjunctiva - Bilateral-No scleral icterus, No Discharge.  ENMT Nose and Sinuses External Inspection of the Nose - no deformities observed, no swelling present.  Chest and Lung Exam Palpation Palpation of the chest reveals - Non-tender. Auscultation Breath sounds - Normal.  Cardiovascular Auscultation Rhythm - Regular. Heart Sounds - S1 WNL and S2 WNL. Carotid arteries - No Carotid bruit.  Abdomen Inspection Inspection of the abdomen reveals - No Visible  peristalsis, No Abnormal pulsations and No Paradoxical movements. Palpation/Percussion Palpation and Percussion of the abdomen reveal - Soft, Non Tender, No Rebound tenderness, No Rigidity (guarding), No hepatosplenomegaly and No Palpable abdominal masses.  Female Genitourinary Sexual Maturity Tanner 5 - Adult hair pattern. Note: No vaginal bleeding nor discharge. no inguinal lymphadenopathy. No inguinal hernias.   Rectal Anorectal Exam External - Note: pruritus ani. Internal - Note: no palpable masses or dilated hemorrhoid tissue, small amount of fecal debris.  Peripheral Vascular Upper Extremity Palpation - Pulses bilaterally normal. Lower Extremity Palpation - Edema - Bilateral - No edema.  Neurologic Neurologic evaluation reveals -normal sensation and normal coordination.  Neuropsychiatric Mental status exam performed with findings of-able to articulate well with normal speech/language, rate, volume and coherence and thought content normal with ability to perform basic computations and apply abstract reasoning.  Musculoskeletal Normal Exam - Bilateral-Upper Extremity Strength Normal and Lower Extremity Strength Normal.  Lymphatic Head & Neck  General Head & Neck Lymphatics: Bilateral - Description - No Localized lymphadenopathy. Axillary  General Axillary Region: Bilateral - Description - No Localized lymphadenopathy. Femoral & Inguinal  Generalized Femoral & Inguinal Lymphatics: Left - Description - No Localized lymphadenopathy. Right - Description - No Localized lymphadenopathy.    Assessment & Plan Heidi Gave M. Marlena Barbato MD; 03/06/2019 10:22 AM) RECTAL ADENOMA (D12.8) Story: Ms. Hannon is a very pleasant 66yoF with hx of HTN, HLD, DM here today for re-evaluation of endoscopically unresectable polyp in cecum as well as a large polyp (bx thus far has shown TVA) in the rectum. Impression: -The anatomy and physiology of the GI tract was discussed at length  with the patient. The pathophysiology of colon cancers and polyps was discussed at length with associated pictures again today. Her daughter and Dr. Johney Maine was present as well. -We discussed robotic/laparoscopic and potential open techniques. We discussed sigmoid colectomy/low anterior section as well as ileocecectomy. We discussed on table colonoscopy. -The planned procedures, material risks (including, but not limited to, pain, bleeding, infection, scarring,  need for blood transfusion, damage to surrounding structures- blood vessels/nerves/viscus/organs, damage to ureter, urine leak, leak from anastomosis, need for additional procedures, need for stoma which may be permanent, hernia, DVT/PE, recurrence, discharge to skilled nursing facilities/rehab, pneumonia, heart attack, stroke, death) benefits and alternatives to surgery were discussed at length. The patient's and her daughter's questions were answered to their satisfaction, they voiced understanding and elected to proceed with surgery. Additionally, we discussed typical postoperative expectations and the recovery process.  Signed electronically by Ileana Roup, MD (03/06/2019 10:22 AM)

## 2019-05-11 ENCOUNTER — Other Ambulatory Visit (HOSPITAL_COMMUNITY)
Admission: RE | Admit: 2019-05-11 | Discharge: 2019-05-11 | Disposition: A | Discharge status: Home / Self Care | Payer: Medicare HMO | Source: Ambulatory Visit | Attending: Surgery | Admitting: Surgery

## 2019-05-11 ENCOUNTER — Ambulatory Visit: Payer: Self-pay | Admitting: Surgery

## 2019-05-11 ENCOUNTER — Encounter (HOSPITAL_COMMUNITY): Payer: Self-pay

## 2019-05-11 ENCOUNTER — Encounter (HOSPITAL_COMMUNITY)
Admission: RE | Admit: 2019-05-11 | Discharge: 2019-05-11 | Disposition: A | Discharge status: Home / Self Care | Payer: Medicare HMO | Source: Ambulatory Visit | Attending: Surgery | Admitting: Surgery

## 2019-05-11 ENCOUNTER — Other Ambulatory Visit: Payer: Self-pay

## 2019-05-11 DIAGNOSIS — Z1159 Encounter for screening for other viral diseases: Secondary | ICD-10-CM | POA: Diagnosis not present

## 2019-05-11 DIAGNOSIS — I447 Left bundle-branch block, unspecified: Secondary | ICD-10-CM | POA: Insufficient documentation

## 2019-05-11 DIAGNOSIS — K639 Disease of intestine, unspecified: Secondary | ICD-10-CM | POA: Insufficient documentation

## 2019-05-11 DIAGNOSIS — R001 Bradycardia, unspecified: Secondary | ICD-10-CM | POA: Diagnosis not present

## 2019-05-11 DIAGNOSIS — Z01818 Encounter for other preprocedural examination: Secondary | ICD-10-CM | POA: Diagnosis not present

## 2019-05-11 DIAGNOSIS — K629 Disease of anus and rectum, unspecified: Secondary | ICD-10-CM | POA: Diagnosis not present

## 2019-05-11 LAB — APTT: aPTT: 27 seconds (ref 24–36)

## 2019-05-11 LAB — CBC WITH DIFFERENTIAL/PLATELET
Abs Immature Granulocytes: 0.03 10*3/uL (ref 0.00–0.07)
Basophils Absolute: 0 10*3/uL (ref 0.0–0.1)
Basophils Relative: 0 %
Eosinophils Absolute: 0.9 10*3/uL — ABNORMAL HIGH (ref 0.0–0.5)
Eosinophils Relative: 8 %
HCT: 41.7 % (ref 36.0–46.0)
Hemoglobin: 13.6 g/dL (ref 12.0–15.0)
Immature Granulocytes: 0 %
Lymphocytes Relative: 32 %
Lymphs Abs: 3.6 10*3/uL (ref 0.7–4.0)
MCH: 31 pg (ref 26.0–34.0)
MCHC: 32.6 g/dL (ref 30.0–36.0)
MCV: 95 fL (ref 80.0–100.0)
Monocytes Absolute: 1.4 10*3/uL — ABNORMAL HIGH (ref 0.1–1.0)
Monocytes Relative: 13 %
Neutro Abs: 5.3 10*3/uL (ref 1.7–7.7)
Neutrophils Relative %: 47 %
Platelets: 275 10*3/uL (ref 150–400)
RBC: 4.39 MIL/uL (ref 3.87–5.11)
RDW: 13.3 % (ref 11.5–15.5)
WBC: 11.2 10*3/uL — ABNORMAL HIGH (ref 4.0–10.5)
nRBC: 0 % (ref 0.0–0.2)

## 2019-05-11 LAB — COMPREHENSIVE METABOLIC PANEL
ALT: 12 U/L (ref 0–44)
AST: 17 U/L (ref 15–41)
Albumin: 4 g/dL (ref 3.5–5.0)
Alkaline Phosphatase: 44 U/L (ref 38–126)
Anion gap: 6 (ref 5–15)
BUN: 21 mg/dL (ref 8–23)
CO2: 28 mmol/L (ref 22–32)
Calcium: 9.7 mg/dL (ref 8.9–10.3)
Chloride: 105 mmol/L (ref 98–111)
Creatinine, Ser: 1 mg/dL (ref 0.44–1.00)
GFR calc Af Amer: 59 mL/min — ABNORMAL LOW (ref >60)
GFR calc non Af Amer: 51 mL/min — ABNORMAL LOW (ref >60)
Glucose, Bld: 81 mg/dL (ref 70–99)
Potassium: 4.7 mmol/L (ref 3.5–5.1)
Sodium: 139 mmol/L (ref 135–145)
Total Bilirubin: 0.5 mg/dL (ref 0.3–1.2)
Total Protein: 7.2 g/dL (ref 6.5–8.1)

## 2019-05-11 LAB — PROTIME-INR
INR: 1 (ref 0.8–1.2)
Prothrombin Time: 13.2 seconds (ref 11.4–15.2)

## 2019-05-11 LAB — HEMOGLOBIN A1C
Hgb A1c MFr Bld: 6.7 % — ABNORMAL HIGH (ref 4.8–5.6)
Mean Plasma Glucose: 145.59 mg/dL

## 2019-05-11 LAB — GLUCOSE, CAPILLARY: Glucose-Capillary: 93 mg/dL (ref 70–99)

## 2019-05-11 MED ORDER — CHLORHEXIDINE GLUCONATE CLOTH 2 % EX PADS
6.0000 | MEDICATED_PAD | Freq: Once | CUTANEOUS | Status: DC
  Filled 2019-05-11: qty 6

## 2019-05-11 NOTE — Progress Notes (Signed)
PCP:Dr. Mclean-Scocazza  CARDIOLOGIST:Dr. Lujean Amel  INFO IN Epic:cardiac clearance 02/22/19,CT pelvis 02/22/19  INFO ON CHART:labs EKG.  Please review.  BLOOD THINNERS AND LAST DOSES:ASA stopped 05/11/19. Per Dr. Dema Severin office ____________________________________  PATIENT SYMPTOMS AT TIME OF PREOP:none Pt was confused about what meds to stop and which to take prior to surgery.  Daughter talked with office and myself about meds and PAT visit. Instructions for bowel prep and ERAS drinks were reviewed. Pt is diabetic so family told to give her G2 drinks not the pre surgery ensure. Family was told for her to continue taking Toprol XL up until DOS and am with sip of water on the AM of surgery and to hold her oral diabetic med.  The ostomy nurse will visit with patient on the day of surgery. Email was sent 05/11/19.

## 2019-05-12 LAB — NOVEL CORONAVIRUS, NAA (HOSP ORDER, SEND-OUT TO REF LAB; TAT 18-24 HRS): SARS-CoV-2, NAA: NOT DETECTED

## 2019-05-12 LAB — CEA: CEA: 3.2 ng/mL (ref 0.0–4.7)

## 2019-05-12 NOTE — Anesthesia Preprocedure Evaluation (Addendum)
Anesthesia Evaluation  Patient identified by MRN, date of birth, ID band Patient awake    Reviewed: Allergy & Precautions, H&P , NPO status , Patient's Chart, lab work & pertinent test results, reviewed documented beta blocker date and time   Airway Mallampati: I  TM Distance: >3 FB Neck ROM: full    Dental no notable dental hx. (+) Edentulous Upper, Edentulous Lower, Upper Dentures, Lower Dentures   Pulmonary former smoker,    Pulmonary exam normal breath sounds clear to auscultation       Cardiovascular Exercise Tolerance: Good hypertension, Pt. on medications and Pt. on home beta blockers negative cardio ROS   Rhythm:regular Rate:Normal  EKG: 05/11/2019 Rate 54 bpm Sinus bradycardia  Left axis deviation  Left bundle branch block   CV: Stress Test 02/22/2019 LVEF= 66 %  FINDINGS: Regional wall motion: demonstrates Normal overall left ventricular  function of the Global walls. The overall quality of the study is good.  Artifacts noted: yes Left ventricular cavity: normal.  Perfusion Analysis: SPECT images demonstrate small perfusion abnormality  of mild intensity is present in the apical region on the stress images.   Neuro/Psych negative neurological ROS  negative psych ROS   GI/Hepatic negative GI ROS, Neg liver ROS,   Endo/Other  negative endocrine ROSdiabetes, Type 2  Renal/GU CRFRenal disease  negative genitourinary   Musculoskeletal   Abdominal   Peds  Hematology negative hematology ROS (+)   Anesthesia Other Findings   Reproductive/Obstetrics negative OB ROS                          Anesthesia Physical Anesthesia Plan  ASA: III  Anesthesia Plan: General   Post-op Pain Management:    Induction: Intravenous  PONV Risk Score and Plan: 3 and Treatment may vary due to age or medical condition and Ondansetron  Airway Management Planned: Oral ETT and  LMA  Additional Equipment:   Intra-op Plan:   Post-operative Plan: Extubation in OR  Informed Consent: I have reviewed the patients History and Physical, chart, labs and discussed the procedure including the risks, benefits and alternatives for the proposed anesthesia with the patient or authorized representative who has indicated his/her understanding and acceptance.     Dental Advisory Given  Plan Discussed with: CRNA, Anesthesiologist and Surgeon  Anesthesia Plan Comments: (See PAT note 05/11/2019, Konrad Felix, PA-C)       Anesthesia Quick Evaluation

## 2019-05-12 NOTE — Progress Notes (Signed)
Anesthesia Chart Review   Case:  [id number] Date/Time:  05/17/19 1045   Procedures:      XI ROBOTIC ASSISTED LOWER ANTERIOR RESECTION (N/A )     LAPAROSCOPIC ILEOCECETOMY (N/A )     COLONOSCOPY (N/A )   Anesthesia type:  General   Pre-op diagnosis:  CECAL POLYP, RECTOSIGMOID MASS   Location:  [facility] ROOM 02 / [facility]   Surgeon:  Ileana Roup, MD      DISCUSSION: 85 yo former smoker with h/o HTN, DM II, LBBB, cecal polyp, rectosigmoid mass scheduled for above procedure 05/17/19 with Dr. Nadeen Landau.   Clearance received from cardiologist, Dr. Lujean Amel, 03/01/2019 which states, "Is at mild risk for cardiovascular complications from her upcoming procedure.  I would recommend that the patient continue their Beta Blocker (if on one) peroperatively including taking it with a small sip of water the morning of the surgery.  I would also recommend dilligent hemodynamic and cardiopulmonary monitoring."  Pt can proceed with planned procedure barring acute status change.  VS: BP (!) 158/70 (BP Location: Left Arm)   Pulse (!) 59   Temp 36.7 C (Oral)   Resp 18   Ht 5\' 4"  (1.626 m)   Wt 87.3 kg   SpO2 95%   BMI 33.04 kg/m   PROVIDERS: McLean-Scocuzza, Nino Glow, MD is PCP   Lujean Amel, MD is Cardiologist  LABS: Labs reviewed: Acceptable for surgery. (all labs ordered are listed, but only abnormal results are displayed)  Labs Reviewed  CBC WITH DIFFERENTIAL/PLATELET - Abnormal; Notable for the following components:      Result Value   WBC 11.2 (*)    Monocytes Absolute 1.4 (*)    Eosinophils Absolute 0.9 (*)    All other components within normal limits  COMPREHENSIVE METABOLIC PANEL - Abnormal; Notable for the following components:   GFR calc non Af Amer 51 (*)    GFR calc Af Amer 59 (*)    All other components within normal limits  HEMOGLOBIN A1C - Abnormal; Notable for the following components:   Hgb A1c MFr Bld 6.7 (*)    All other components within normal limits   NOVEL CORONAVIRUS, NAA (HOSPITAL ORDER, SEND-OUT TO REF LAB)  APTT  CEA  PROTIME-INR  GLUCOSE, CAPILLARY  TYPE AND SCREEN     IMAGES:   EKG: 05/11/2019 Rate 54 bpm Sinus bradycardia  Left axis deviation  Left bundle branch block   CV: Stress Test 02/22/2019 LVEF= 66 %  FINDINGS: Regional wall motion: demonstrates Normal overall left ventricular  function of the Global walls. The overall quality of the study is good.  Artifacts noted: yes Left ventricular cavity: normal.  Perfusion Analysis: SPECT images demonstrate small perfusion abnormality  of mild intensity is present in the apical region on the stress images. Past Medical History:  Diagnosis Date  . Diabetes mellitus without complication ([place])   . Hard of hearing   . Hypertension     Past Surgical History:  Procedure Laterality Date  . CESAREAN SECTION    . COLONOSCOPY WITH PROPOFOL N/A 01/09/2019   Procedure: COLONOSCOPY WITH PROPOFOL;  Surgeon: Lin Landsman, MD;  Location: [facility] ENDOSCOPY;  Service: Gastroenterology;  Laterality: N/A;  . ESOPHAGOGASTRODUODENOSCOPY (EGD) WITH PROPOFOL N/A 01/09/2019   Procedure: ESOPHAGOGASTRODUODENOSCOPY (EGD) WITH PROPOFOL;  Surgeon: Lin Landsman, MD;  [facility] END[facility]COPY;  Service: Gastroenterology;  Laterality: N/A;  . KNEE SURGERY     left 2017/2018  . SHOULDER SURGERY  right    MEDICATIONS: . aspirin 81 MG chewable tablet  . carbamide peroxide (DEBROX) 6.5 % OTIC solution  . clobetasol cream (TEMOVATE) 0.05 %  . diphenoxylate-atropine (LOMOTIL) 2.5-0.025 MG tablet  . glimepiride (AMARYL) 2 MG tablet  . lisinopril (PRINIVIL,ZESTRIL) 20 MG tablet  . lovastatin (MEVACOR) 20 MG tablet  . metoprolol succinate (TOPROL-XL) 100 MG 24 hr tablet  . traZODone (DESYREL) 50 MG tablet  . VITAMIN D PO   No current facility-administered medications for this encounter.     Maia Plan [facility] Pre-Surgical Testing [phone] 05/12/19  2:21 PM

## 2019-05-16 MED ORDER — BUPIVACAINE LIPOSOME 1.3 % IJ SUSP
20.0000 mL | Freq: Once | INTRAMUSCULAR | Status: DC
  Filled 2019-05-16: qty 20

## 2019-05-17 ENCOUNTER — Encounter (HOSPITAL_COMMUNITY): Payer: Self-pay | Admitting: *Deleted

## 2019-05-17 ENCOUNTER — Inpatient Hospital Stay (HOSPITAL_COMMUNITY): Payer: Medicare HMO | Admitting: Physician Assistant

## 2019-05-17 ENCOUNTER — Inpatient Hospital Stay (HOSPITAL_COMMUNITY): Payer: Medicare HMO | Admitting: Anesthesiology

## 2019-05-17 ENCOUNTER — Inpatient Hospital Stay (HOSPITAL_COMMUNITY)
Admission: RE | Admit: 2019-05-17 | Discharge: 2019-05-23 | DRG: 331 | Disposition: A | Discharge status: Home Health | Payer: Medicare HMO | Attending: Surgery | Admitting: Surgery

## 2019-05-17 ENCOUNTER — Encounter (HOSPITAL_COMMUNITY)
Admission: RE | Disposition: A | Discharge status: Home Health | Payer: Self-pay | Source: Home / Self Care | Attending: Surgery

## 2019-05-17 DIAGNOSIS — K6289 Other specified diseases of anus and rectum: Secondary | ICD-10-CM | POA: Diagnosis present

## 2019-05-17 DIAGNOSIS — K8020 Calculus of gallbladder without cholecystitis without obstruction: Secondary | ICD-10-CM | POA: Diagnosis not present

## 2019-05-17 DIAGNOSIS — I447 Left bundle-branch block, unspecified: Secondary | ICD-10-CM | POA: Diagnosis present

## 2019-05-17 DIAGNOSIS — D649 Anemia, unspecified: Secondary | ICD-10-CM | POA: Diagnosis not present

## 2019-05-17 DIAGNOSIS — Z79899 Other long term (current) drug therapy: Secondary | ICD-10-CM | POA: Diagnosis not present

## 2019-05-17 DIAGNOSIS — I10 Essential (primary) hypertension: Secondary | ICD-10-CM

## 2019-05-17 DIAGNOSIS — E1122 Type 2 diabetes mellitus with diabetic chronic kidney disease: Secondary | ICD-10-CM | POA: Diagnosis present

## 2019-05-17 DIAGNOSIS — N183 Chronic kidney disease, stage 3 (moderate): Secondary | ICD-10-CM | POA: Diagnosis present

## 2019-05-17 DIAGNOSIS — K529 Noninfective gastroenteritis and colitis, unspecified: Secondary | ICD-10-CM | POA: Diagnosis present

## 2019-05-17 DIAGNOSIS — K5730 Diverticulosis of large intestine without perforation or abscess without bleeding: Secondary | ICD-10-CM | POA: Diagnosis present

## 2019-05-17 DIAGNOSIS — Z1159 Encounter for screening for other viral diseases: Secondary | ICD-10-CM | POA: Diagnosis not present

## 2019-05-17 DIAGNOSIS — Z6834 Body mass index (BMI) 34.0-34.9, adult: Secondary | ICD-10-CM

## 2019-05-17 DIAGNOSIS — R001 Bradycardia, unspecified: Secondary | ICD-10-CM | POA: Diagnosis not present

## 2019-05-17 DIAGNOSIS — N189 Chronic kidney disease, unspecified: Secondary | ICD-10-CM | POA: Diagnosis not present

## 2019-05-17 DIAGNOSIS — E1169 Type 2 diabetes mellitus with other specified complication: Secondary | ICD-10-CM

## 2019-05-17 DIAGNOSIS — E785 Hyperlipidemia, unspecified: Secondary | ICD-10-CM | POA: Diagnosis not present

## 2019-05-17 DIAGNOSIS — D127 Benign neoplasm of rectosigmoid junction: Principal | ICD-10-CM | POA: Diagnosis present

## 2019-05-17 DIAGNOSIS — E669 Obesity, unspecified: Secondary | ICD-10-CM

## 2019-05-17 DIAGNOSIS — I129 Hypertensive chronic kidney disease with stage 1 through stage 4 chronic kidney disease, or unspecified chronic kidney disease: Secondary | ICD-10-CM | POA: Diagnosis not present

## 2019-05-17 DIAGNOSIS — D72828 Other elevated white blood cell count: Secondary | ICD-10-CM | POA: Diagnosis present

## 2019-05-17 DIAGNOSIS — Z87891 Personal history of nicotine dependence: Secondary | ICD-10-CM

## 2019-05-17 DIAGNOSIS — D72829 Elevated white blood cell count, unspecified: Secondary | ICD-10-CM | POA: Diagnosis not present

## 2019-05-17 DIAGNOSIS — R32 Unspecified urinary incontinence: Secondary | ICD-10-CM | POA: Diagnosis present

## 2019-05-17 DIAGNOSIS — D120 Benign neoplasm of cecum: Secondary | ICD-10-CM | POA: Diagnosis present

## 2019-05-17 DIAGNOSIS — H9190 Unspecified hearing loss, unspecified ear: Secondary | ICD-10-CM | POA: Diagnosis present

## 2019-05-17 DIAGNOSIS — I4891 Unspecified atrial fibrillation: Secondary | ICD-10-CM | POA: Diagnosis present

## 2019-05-17 HISTORY — PX: COLONOSCOPY: SHX5424

## 2019-05-17 HISTORY — PX: XI ROBOTIC ASSISTED LOWER ANTERIOR RESECTION: SHX6558

## 2019-05-17 LAB — GLUCOSE, CAPILLARY
Glucose-Capillary: 120 mg/dL — ABNORMAL HIGH (ref 70–99)
Glucose-Capillary: 200 mg/dL — ABNORMAL HIGH (ref 70–99)
Glucose-Capillary: 138 mg/dL — ABNORMAL HIGH (ref 70–99)

## 2019-05-17 LAB — CBC WITH DIFFERENTIAL/PLATELET
Abs Immature Granulocytes: 0.1 10*3/uL — ABNORMAL HIGH (ref 0.00–0.07)
Basophils Absolute: 0 10*3/uL (ref 0.0–0.1)
Basophils Relative: 0 %
Eosinophils Absolute: 0 10*3/uL (ref 0.0–0.5)
Eosinophils Relative: 0 %
HCT: 39.3 % (ref 36.0–46.0)
Hemoglobin: 12.2 g/dL (ref 12.0–15.0)
Immature Granulocytes: 1 %
Lymphocytes Relative: 8 %
Lymphs Abs: 1.5 10*3/uL (ref 0.7–4.0)
MCH: 29.5 pg (ref 26.0–34.0)
MCHC: 31 g/dL (ref 30.0–36.0)
MCV: 95.2 fL (ref 80.0–100.0)
Monocytes Absolute: 1.9 10*3/uL — ABNORMAL HIGH (ref 0.1–1.0)
Monocytes Relative: 11 %
Neutro Abs: 14.1 10*3/uL — ABNORMAL HIGH (ref 1.7–7.7)
Neutrophils Relative %: 80 %
Platelets: 236 10*3/uL (ref 150–400)
RBC: 4.13 MIL/uL (ref 3.87–5.11)
RDW: 13.2 % (ref 11.5–15.5)
WBC: 17.6 10*3/uL — ABNORMAL HIGH (ref 4.0–10.5)
nRBC: 0 % (ref 0.0–0.2)

## 2019-05-17 LAB — TYPE AND SCREEN
ABO/RH(D): O POS
Antibody Screen: NEGATIVE

## 2019-05-17 LAB — PHOSPHORUS: Phosphorus: 3.7 mg/dL (ref 2.5–4.6)

## 2019-05-17 LAB — COMPREHENSIVE METABOLIC PANEL
ALT: 11 U/L (ref 0–44)
AST: 19 U/L (ref 15–41)
Albumin: 3.7 g/dL (ref 3.5–5.0)
Alkaline Phosphatase: 39 U/L (ref 38–126)
Anion gap: 7 (ref 5–15)
BUN: 19 mg/dL (ref 8–23)
CO2: 25 mmol/L (ref 22–32)
Calcium: 8.8 mg/dL — ABNORMAL LOW (ref 8.9–10.3)
Chloride: 105 mmol/L (ref 98–111)
Creatinine, Ser: 1.04 mg/dL — ABNORMAL HIGH (ref 0.44–1.00)
GFR calc Af Amer: 56 mL/min — ABNORMAL LOW (ref >60)
GFR calc non Af Amer: 48 mL/min — ABNORMAL LOW (ref >60)
Glucose, Bld: 192 mg/dL — ABNORMAL HIGH (ref 70–99)
Potassium: 4.1 mmol/L (ref 3.5–5.1)
Sodium: 137 mmol/L (ref 135–145)
Total Bilirubin: 0.9 mg/dL (ref 0.3–1.2)
Total Protein: 6.6 g/dL (ref 6.5–8.1)

## 2019-05-17 LAB — MAGNESIUM: Magnesium: 2.1 mg/dL (ref 1.7–2.4)

## 2019-05-17 LAB — MRSA PCR SCREENING: MRSA by PCR: NEGATIVE

## 2019-05-17 LAB — TROPONIN I: Troponin I: <0.03 ng/mL (ref <0.03)

## 2019-05-17 SURGERY — RESECTION, RECTUM, LOW ANTERIOR, ROBOT-ASSISTED
Anesthesia: General | Site: Abdomen

## 2019-05-17 MED ORDER — SODIUM CHLORIDE 0.9 % IV SOLN: 2.0000 g | INTRAVENOUS | Status: DC

## 2019-05-17 MED ORDER — ALUM & MAG HYDROXIDE-SIMETH 200-200-20 MG/5ML PO SUSP: 30.0000 mL | Freq: Four times a day (QID) | ORAL | Status: DC | PRN

## 2019-05-17 MED ORDER — ONDANSETRON HCL 4 MG/2ML IJ SOLN: 4.0000 mg | Freq: Once | INTRAMUSCULAR | Status: DC | PRN

## 2019-05-17 MED ORDER — POLYETHYLENE GLYCOL 3350 17 GM/SCOOP PO POWD: 1.0000 | Freq: Once | ORAL | Status: DC

## 2019-05-17 MED ORDER — SODIUM CHLORIDE 0.9 % IR SOLN
Status: DC | PRN
  Administered 2019-05-17 (×2): 1000 mL

## 2019-05-17 MED ORDER — ASPIRIN 81 MG PO CHEW
81.0000 mg | CHEWABLE_TABLET | Freq: Every day | ORAL | Status: DC
  Administered 2019-05-18 – 2019-05-23 (×6): 81 mg via ORAL
  Filled 2019-05-17 (×6): qty 1

## 2019-05-17 MED ORDER — ROCURONIUM BROMIDE 10 MG/ML (PF) SYRINGE
PREFILLED_SYRINGE | INTRAVENOUS | Status: AC
  Filled 2019-05-17: qty 10

## 2019-05-17 MED ORDER — ROCURONIUM BROMIDE 10 MG/ML (PF) SYRINGE
PREFILLED_SYRINGE | INTRAVENOUS | Status: DC | PRN
  Administered 2019-05-17: 50 mg via INTRAVENOUS
  Administered 2019-05-17: 20 mg via INTRAVENOUS
  Administered 2019-05-17: 10 mg via INTRAVENOUS
  Administered 2019-05-17: 20 mg via INTRAVENOUS

## 2019-05-17 MED ORDER — METOPROLOL TARTRATE 5 MG/5ML IV SOLN
INTRAVENOUS | Status: DC | PRN
  Administered 2019-05-17: 2 mg via INTRAVENOUS
  Administered 2019-05-17: 1 mg via INTRAVENOUS

## 2019-05-17 MED ORDER — SODIUM CHLORIDE 0.9 % IV SOLN
2.0000 g | INTRAVENOUS | Status: AC
  Administered 2019-05-17: 2 g via INTRAVENOUS
  Filled 2019-05-17: qty 2

## 2019-05-17 MED ORDER — GABAPENTIN 300 MG PO CAPS
300.0000 mg | ORAL_CAPSULE | ORAL | Status: AC
  Administered 2019-05-17: 300 mg via ORAL
  Filled 2019-05-17: qty 1

## 2019-05-17 MED ORDER — LACTATED RINGERS IV SOLN
INTRAVENOUS | Status: DC
  Administered 2019-05-17: 10:00:00 via INTRAVENOUS

## 2019-05-17 MED ORDER — ORAL CARE MOUTH RINSE
15.0000 mL | Freq: Two times a day (BID) | OROMUCOSAL | Status: DC
  Administered 2019-05-18 – 2019-05-23 (×4): 15 mL via OROMUCOSAL

## 2019-05-17 MED ORDER — INSULIN ASPART 100 UNIT/ML ~~LOC~~ SOLN: 0.0000 [IU] | Freq: Every day | SUBCUTANEOUS | Status: DC

## 2019-05-17 MED ORDER — PRAVASTATIN SODIUM 20 MG PO TABS
20.0000 mg | ORAL_TABLET | Freq: Every day | ORAL | Status: DC
  Administered 2019-05-18 – 2019-05-22 (×5): 20 mg via ORAL
  Filled 2019-05-17 (×5): qty 1

## 2019-05-17 MED ORDER — DIPHENHYDRAMINE HCL 12.5 MG/5ML PO ELIX: 12.5000 mg | ORAL_SOLUTION | Freq: Four times a day (QID) | ORAL | Status: DC | PRN

## 2019-05-17 MED ORDER — TRAZODONE HCL 50 MG PO TABS: 50.0000 mg | ORAL_TABLET | Freq: Every evening | ORAL | Status: DC | PRN

## 2019-05-17 MED ORDER — OXYCODONE HCL 5 MG/5ML PO SOLN: 5.0000 mg | Freq: Once | ORAL | Status: DC | PRN

## 2019-05-17 MED ORDER — PHENYLEPHRINE 40 MCG/ML (10ML) SYRINGE FOR IV PUSH (FOR BLOOD PRESSURE SUPPORT)
PREFILLED_SYRINGE | INTRAVENOUS | Status: DC | PRN
  Administered 2019-05-17: 60 ug via INTRAVENOUS
  Administered 2019-05-17: 160 ug via INTRAVENOUS

## 2019-05-17 MED ORDER — TRAMADOL HCL 50 MG PO TABS: 50.0000 mg | ORAL_TABLET | Freq: Four times a day (QID) | ORAL | Status: DC | PRN

## 2019-05-17 MED ORDER — HYDRALAZINE HCL 20 MG/ML IJ SOLN: 10.0000 mg | INTRAMUSCULAR | Status: DC | PRN

## 2019-05-17 MED ORDER — OXYCODONE HCL 5 MG PO TABS: 5.0000 mg | ORAL_TABLET | Freq: Once | ORAL | Status: DC | PRN

## 2019-05-17 MED ORDER — LACTATED RINGERS IV SOLN
INTRAVENOUS | Status: DC
  Administered 2019-05-18 – 2019-05-19 (×2): via INTRAVENOUS

## 2019-05-17 MED ORDER — ESMOLOL HCL 100 MG/10ML IV SOLN
INTRAVENOUS | Status: DC | PRN
  Administered 2019-05-17: 50 mg via INTRAVENOUS

## 2019-05-17 MED ORDER — ONDANSETRON HCL 4 MG/2ML IJ SOLN: 4.0000 mg | Freq: Four times a day (QID) | INTRAMUSCULAR | Status: DC | PRN

## 2019-05-17 MED ORDER — ACETAMINOPHEN 325 MG PO TABS
650.0000 mg | ORAL_TABLET | Freq: Four times a day (QID) | ORAL | Status: DC
  Administered 2019-05-17 – 2019-05-23 (×20): 650 mg via ORAL
  Filled 2019-05-17 (×20): qty 2

## 2019-05-17 MED ORDER — ACETAMINOPHEN 325 MG PO TABS: 325.0000 mg | ORAL_TABLET | ORAL | Status: DC | PRN

## 2019-05-17 MED ORDER — ALVIMOPAN 12 MG PO CAPS
12.0000 mg | ORAL_CAPSULE | ORAL | Status: AC
  Administered 2019-05-17: 12 mg via ORAL
  Filled 2019-05-17: qty 1

## 2019-05-17 MED ORDER — LISINOPRIL 10 MG PO TABS: 20.0000 mg | ORAL_TABLET | Freq: Every day | ORAL | Status: DC

## 2019-05-17 MED ORDER — EPHEDRINE SULFATE 50 MG/ML IJ SOLN
INTRAMUSCULAR | Status: DC | PRN
  Administered 2019-05-17: 5 mg via INTRAVENOUS

## 2019-05-17 MED ORDER — FENTANYL CITRATE (PF) 100 MCG/2ML IJ SOLN
INTRAMUSCULAR | Status: AC
  Filled 2019-05-17: qty 2

## 2019-05-17 MED ORDER — LIDOCAINE 2% (20 MG/ML) 5 ML SYRINGE
INTRAMUSCULAR | Status: AC
  Filled 2019-05-17: qty 5

## 2019-05-17 MED ORDER — METOPROLOL SUCCINATE ER 100 MG PO TB24
100.0000 mg | ORAL_TABLET | Freq: Every day | ORAL | Status: DC
  Administered 2019-05-18 – 2019-05-23 (×6): 100 mg via ORAL
  Filled 2019-05-17 (×3): qty 1
  Filled 2019-05-17: qty 4
  Filled 2019-05-17 (×2): qty 1

## 2019-05-17 MED ORDER — INSULIN ASPART 100 UNIT/ML ~~LOC~~ SOLN
0.0000 [IU] | Freq: Three times a day (TID) | SUBCUTANEOUS | Status: DC
  Administered 2019-05-18: 2 [IU] via SUBCUTANEOUS
  Administered 2019-05-19 (×2): 1 [IU] via SUBCUTANEOUS
  Administered 2019-05-20 – 2019-05-22 (×4): 2 [IU] via SUBCUTANEOUS
  Administered 2019-05-22 – 2019-05-23 (×2): 1 [IU] via SUBCUTANEOUS

## 2019-05-17 MED ORDER — BUPIVACAINE-EPINEPHRINE (PF) 0.25% -1:200000 IJ SOLN
INTRAMUSCULAR | Status: AC
  Filled 2019-05-17: qty 30

## 2019-05-17 MED ORDER — HEPARIN SODIUM (PORCINE) 5000 UNIT/ML IJ SOLN
5000.0000 [IU] | Freq: Three times a day (TID) | INTRAMUSCULAR | Status: DC
  Administered 2019-05-18 – 2019-05-23 (×15): 5000 [IU] via SUBCUTANEOUS
  Filled 2019-05-17 (×16): qty 1

## 2019-05-17 MED ORDER — MEPERIDINE HCL 50 MG/ML IJ SOLN: 6.2500 mg | INTRAMUSCULAR | Status: DC | PRN

## 2019-05-17 MED ORDER — ALVIMOPAN 12 MG PO CAPS: 12.0000 mg | ORAL_CAPSULE | ORAL | Status: DC

## 2019-05-17 MED ORDER — OLOPATADINE HCL 0.1 % OP SOLN
1.0000 [drp] | Freq: Two times a day (BID) | OPHTHALMIC | Status: DC | PRN
  Administered 2019-05-18: 1 [drp] via OPHTHALMIC
  Filled 2019-05-17: qty 5

## 2019-05-17 MED ORDER — DIPHENHYDRAMINE HCL 50 MG/ML IJ SOLN: 12.5000 mg | Freq: Four times a day (QID) | INTRAMUSCULAR | Status: DC | PRN

## 2019-05-17 MED ORDER — NEOMYCIN SULFATE 500 MG PO TABS: 1000.0000 mg | ORAL_TABLET | ORAL | Status: DC

## 2019-05-17 MED ORDER — PROPOFOL 10 MG/ML IV BOLUS
INTRAVENOUS | Status: AC
  Filled 2019-05-17: qty 20

## 2019-05-17 MED ORDER — FENTANYL CITRATE (PF) 100 MCG/2ML IJ SOLN
25.0000 ug | INTRAMUSCULAR | Status: DC | PRN
  Administered 2019-05-17: 25 ug via INTRAVENOUS

## 2019-05-17 MED ORDER — SUGAMMADEX SODIUM 200 MG/2ML IV SOLN
INTRAVENOUS | Status: DC | PRN
  Administered 2019-05-17: 200 mg via INTRAVENOUS

## 2019-05-17 MED ORDER — ACETAMINOPHEN 160 MG/5ML PO SOLN: 325.0000 mg | ORAL | Status: DC | PRN

## 2019-05-17 MED ORDER — LACTATED RINGERS IV SOLN
INTRAVENOUS | Status: DC
  Administered 2019-05-17: 19:00:00 via INTRAVENOUS

## 2019-05-17 MED ORDER — HYDROMORPHONE HCL 1 MG/ML IJ SOLN: 0.5000 mg | INTRAMUSCULAR | Status: DC | PRN

## 2019-05-17 MED ORDER — ENSURE SURGERY PO LIQD
237.0000 mL | Freq: Two times a day (BID) | ORAL | Status: DC
  Administered 2019-05-22 – 2019-05-23 (×2): 237 mL via ORAL
  Filled 2019-05-17 (×12): qty 237

## 2019-05-17 MED ORDER — BUPIVACAINE LIPOSOME 1.3 % IJ SUSP
INTRAMUSCULAR | Status: DC | PRN
  Administered 2019-05-17: 20 mL

## 2019-05-17 MED ORDER — CHLORHEXIDINE GLUCONATE CLOTH 2 % EX PADS
6.0000 | MEDICATED_PAD | Freq: Every day | CUTANEOUS | Status: DC
  Administered 2019-05-17 – 2019-05-23 (×5): 6 via TOPICAL

## 2019-05-17 MED ORDER — METRONIDAZOLE 500 MG PO TABS: 1000.0000 mg | ORAL_TABLET | ORAL | Status: DC

## 2019-05-17 MED ORDER — ONDANSETRON HCL 4 MG PO TABS: 4.0000 mg | ORAL_TABLET | Freq: Four times a day (QID) | ORAL | Status: DC | PRN

## 2019-05-17 MED ORDER — ALVIMOPAN 12 MG PO CAPS
12.0000 mg | ORAL_CAPSULE | Freq: Two times a day (BID) | ORAL | Status: DC
  Administered 2019-05-18 (×2): 12 mg via ORAL
  Filled 2019-05-17 (×2): qty 1

## 2019-05-17 MED ORDER — HEPARIN SODIUM (PORCINE) 5000 UNIT/ML IJ SOLN
5000.0000 [IU] | Freq: Once | INTRAMUSCULAR | Status: AC
  Administered 2019-05-17: 5000 [IU] via SUBCUTANEOUS
  Filled 2019-05-17: qty 1

## 2019-05-17 MED ORDER — ACETAMINOPHEN 500 MG PO TABS
1000.0000 mg | ORAL_TABLET | ORAL | Status: AC
  Administered 2019-05-17: 1000 mg via ORAL
  Filled 2019-05-17: qty 2

## 2019-05-17 MED ORDER — BUPIVACAINE-EPINEPHRINE (PF) 0.25% -1:200000 IJ SOLN
INTRAMUSCULAR | Status: DC | PRN
  Administered 2019-05-17: 30 mL via PERINEURAL

## 2019-05-17 MED ORDER — FENTANYL CITRATE (PF) 100 MCG/2ML IJ SOLN
INTRAMUSCULAR | Status: DC | PRN
  Administered 2019-05-17 (×2): 50 ug via INTRAVENOUS
  Administered 2019-05-17 (×2): 25 ug via INTRAVENOUS
  Administered 2019-05-17 (×3): 50 ug via INTRAVENOUS

## 2019-05-17 MED ORDER — PROPOFOL 10 MG/ML IV BOLUS
INTRAVENOUS | Status: DC | PRN
  Administered 2019-05-17: 40 mg via INTRAVENOUS
  Administered 2019-05-17: 100 mg via INTRAVENOUS

## 2019-05-17 MED ORDER — LIDOCAINE 2% (20 MG/ML) 5 ML SYRINGE
INTRAMUSCULAR | Status: DC | PRN
  Administered 2019-05-17: 60 mg via INTRAVENOUS

## 2019-05-17 SURGICAL SUPPLY — 99 items
APPLIER CLIP 5 13 M/L LIGAMAX5 (MISCELLANEOUS)
APPLIER CLIP ROT 10 11.4 M/L (STAPLE)
BLADE EXTENDED COATED 6.5IN (ELECTRODE) ×3 IMPLANT
CANNULA REDUC XI 12-8 STAPL (CANNULA) ×1
CANNULA REDUCER 12-8 DVNC XI (CANNULA) ×2 IMPLANT
CELLS DAT CNTRL 66122 CELL SVR (MISCELLANEOUS) IMPLANT
CHLORAPREP W/TINT 26 (MISCELLANEOUS) ×3 IMPLANT
CLIP APPLIE 5 13 M/L LIGAMAX5 (MISCELLANEOUS) IMPLANT
CLIP APPLIE ROT 10 11.4 M/L (STAPLE) IMPLANT
CLIP VESOLOCK LG 6/CT PURPLE (CLIP) IMPLANT
CLIP VESOLOCK MED LG 6/CT (CLIP) IMPLANT
COVER SURGICAL LIGHT HANDLE (MISCELLANEOUS) ×6 IMPLANT
COVER TIP SHEARS 8 DVNC (MISCELLANEOUS) ×2 IMPLANT
COVER TIP SHEARS 8MM DA VINCI (MISCELLANEOUS) ×1
COVER WAND RF STERILE (DRAPES) IMPLANT
DECANTER SPIKE VIAL GLASS SM (MISCELLANEOUS) ×3 IMPLANT
DERMABOND ADVANCED (GAUZE/BANDAGES/DRESSINGS) ×1
DERMABOND ADVANCED .7 DNX12 (GAUZE/BANDAGES/DRESSINGS) ×2 IMPLANT
DEVICE TROCAR PUNCTURE CLOSURE (ENDOMECHANICALS) IMPLANT
DRAIN CHANNEL 19F RND (DRAIN) IMPLANT
DRAPE ARM DVNC X/XI (DISPOSABLE) ×8 IMPLANT
DRAPE COLUMN DVNC XI (DISPOSABLE) ×2 IMPLANT
DRAPE DA VINCI XI ARM (DISPOSABLE) ×4
DRAPE DA VINCI XI COLUMN (DISPOSABLE) ×1
DRAPE SURG IRRIG POUCH 19X23 (DRAPES) ×3 IMPLANT
DRSG OPSITE POSTOP 4X10 (GAUZE/BANDAGES/DRESSINGS) IMPLANT
DRSG OPSITE POSTOP 4X6 (GAUZE/BANDAGES/DRESSINGS) IMPLANT
DRSG OPSITE POSTOP 4X8 (GAUZE/BANDAGES/DRESSINGS) IMPLANT
DRSG TEGADERM 2-3/8X2-3/4 SM (GAUZE/BANDAGES/DRESSINGS) ×15 IMPLANT
DRSG TEGADERM 4X4.75 (GAUZE/BANDAGES/DRESSINGS) ×3 IMPLANT
ELECT PENCIL ROCKER SW 15FT (MISCELLANEOUS) ×3 IMPLANT
ELECT REM PT RETURN 15FT ADLT (MISCELLANEOUS) ×3 IMPLANT
ENDOLOOP SUT PDS II  0 18 (SUTURE)
ENDOLOOP SUT PDS II 0 18 (SUTURE) IMPLANT
EVACUATOR SILICONE 100CC (DRAIN) IMPLANT
GAUZE SPONGE 2X2 8PLY STRL LF (GAUZE/BANDAGES/DRESSINGS) ×2 IMPLANT
GAUZE SPONGE 4X4 12PLY STRL (GAUZE/BANDAGES/DRESSINGS) IMPLANT
GLOVE BIO SURGEON STRL SZ7.5 (GLOVE) ×9 IMPLANT
GLOVE INDICATOR 8.0 STRL GRN (GLOVE) ×9 IMPLANT
GOWN STRL REUS W/TWL XL LVL3 (GOWN DISPOSABLE) ×15 IMPLANT
GRASPER SUT TROCAR 14GX15 (MISCELLANEOUS) ×3 IMPLANT
HOLDER FOLEY CATH W/STRAP (MISCELLANEOUS) ×3 IMPLANT
IRRIG SUCT STRYKERFLOW 2 WTIP (MISCELLANEOUS)
IRRIGATION SUCT STRKRFLW 2 WTP (MISCELLANEOUS) IMPLANT
KIT PROCEDURE DA VINCI SI (MISCELLANEOUS)
KIT PROCEDURE DVNC SI (MISCELLANEOUS) IMPLANT
KIT TURNOVER KIT A (KITS) IMPLANT
NEEDLE INSUFFLATION 14GA 120MM (NEEDLE) ×3 IMPLANT
PACK CARDIOVASCULAR III (CUSTOM PROCEDURE TRAY) ×3 IMPLANT
PACK COLON (CUSTOM PROCEDURE TRAY) ×3 IMPLANT
PAD POSITIONING PINK XL (MISCELLANEOUS) ×3 IMPLANT
PORT LAP GEL ALEXIS MED 5-9CM (MISCELLANEOUS) ×3 IMPLANT
PROTECTOR NERVE ULNAR (MISCELLANEOUS) ×6 IMPLANT
RTRCTR WOUND ALEXIS 18CM MED (MISCELLANEOUS)
SCISSORS LAP 5X35 DISP (ENDOMECHANICALS) ×3 IMPLANT
SEAL CANN UNIV 5-8 DVNC XI (MISCELLANEOUS) ×8 IMPLANT
SEAL XI 5MM-8MM UNIVERSAL (MISCELLANEOUS) ×4
SEALER VESSEL DA VINCI XI (MISCELLANEOUS) ×1
SEALER VESSEL EXT DVNC XI (MISCELLANEOUS) ×2 IMPLANT
SLEEVE ADV FIXATION 5X100MM (TROCAR) IMPLANT
SOLUTION ELECTROLUBE (MISCELLANEOUS) ×3 IMPLANT
SPONGE GAUZE 2X2 STER 10/PKG (GAUZE/BANDAGES/DRESSINGS) ×1
STAPLER 45 BLU RELOAD XI (STAPLE) IMPLANT
STAPLER 45 BLUE RELOAD XI (STAPLE)
STAPLER 45 GREEN RELOAD XI (STAPLE) ×2
STAPLER 45 GRN RELOAD XI (STAPLE) ×4 IMPLANT
STAPLER CANNULA SEAL DVNC XI (STAPLE) ×2 IMPLANT
STAPLER CANNULA SEAL XI (STAPLE) ×1
STAPLER SHEATH (SHEATH) ×1
STAPLER SHEATH ENDOWRIST DVNC (SHEATH) ×2 IMPLANT
SURGILUBE 2OZ TUBE FLIPTOP (MISCELLANEOUS) ×3 IMPLANT
SUT MNCRL AB 4-0 PS2 18 (SUTURE) ×3 IMPLANT
SUT NOVA NAB DX-16 0-1 5-0 T12 (SUTURE) ×3 IMPLANT
SUT PDS AB 1 CTX 36 (SUTURE) IMPLANT
SUT PDS AB 1 TP1 96 (SUTURE) IMPLANT
SUT PROLENE 0 CT 2 (SUTURE) IMPLANT
SUT PROLENE 2 0 KS (SUTURE) IMPLANT
SUT PROLENE 2 0 SH DA (SUTURE) IMPLANT
SUT SILK 2 0 (SUTURE)
SUT SILK 2 0 SH CR/8 (SUTURE) IMPLANT
SUT SILK 2-0 18XBRD TIE 12 (SUTURE) IMPLANT
SUT SILK 3 0 (SUTURE) ×1
SUT SILK 3 0 SH CR/8 (SUTURE) ×3 IMPLANT
SUT SILK 3-0 18XBRD TIE 12 (SUTURE) ×2 IMPLANT
SUT V-LOC BARB 180 2/0GR6 GS22 (SUTURE)
SUT VIC AB 3-0 SH 18 (SUTURE) IMPLANT
SUT VIC AB 3-0 SH 27 (SUTURE)
SUT VIC AB 3-0 SH 27XBRD (SUTURE) IMPLANT
SUT VICRYL 0 UR6 27IN ABS (SUTURE) ×3 IMPLANT
SUTURE V-LC BRB 180 2/0GR6GS22 (SUTURE) IMPLANT
SYR 10ML LL (SYRINGE) ×3 IMPLANT
SYS LAPSCP GELPORT 120MM (MISCELLANEOUS)
SYSTEM LAPSCP GELPORT 120MM (MISCELLANEOUS) IMPLANT
TAPE UMBILICAL COTTON 1/8X30 (MISCELLANEOUS) ×3 IMPLANT
TOWEL OR NON WOVEN STRL DISP B (DISPOSABLE) ×3 IMPLANT
TRAY FOLEY MTR SLVR 16FR STAT (SET/KITS/TRAYS/PACK) ×3 IMPLANT
TROCAR ADV FIXATION 5X100MM (TROCAR) ×3 IMPLANT
TUBING CONNECTING 10 (TUBING) ×6 IMPLANT
TUBING INSUFFLATION 10FT LAP (TUBING) ×3 IMPLANT

## 2019-05-17 NOTE — Progress Notes (Signed)
Confirmed with MD that the COVID test done on 05/11/19 was still valid and MD confirmed that she did not need to be retested for this admission

## 2019-05-17 NOTE — Anesthesia Postprocedure Evaluation (Signed)
Anesthesia Post Note  Patient: Heidi Whitaker  Procedure(s) Performed: XI ROBOTIC ASSISTED LOWER ANTERIOR RESECTION WITH END COLOSTOMY (N/A Abdomen) FLEXIBLE SIGMOIDOSCOPY (N/A )     Patient location during evaluation: PACU Anesthesia Type: General Level of consciousness: awake and alert Pain management: pain level controlled Vital Signs Assessment: post-procedure vital signs reviewed and stable Respiratory status: spontaneous breathing, nonlabored ventilation, respiratory function stable and patient connected to nasal cannula oxygen Cardiovascular status: blood pressure returned to baseline and stable Postop Assessment: no apparent nausea or vomiting Anesthetic complications: no    Last Vitals:  Vitals:   05/17/19 1700 05/17/19 1715  BP: 100/69 (!) 120/56  Pulse: 72 71  Resp: 13 14  Temp:    SpO2: 98% 99%    Last Pain:  Vitals:   05/17/19 1715  TempSrc:   PainSc: 7                  [name]

## 2019-05-17 NOTE — Transfer of Care (Signed)
Immediate Anesthesia Transfer of Care Note  Patient: Heidi Whitaker  Procedure(s) Performed: XI ROBOTIC ASSISTED LOWER ANTERIOR RESECTION WITH END COLOSTOMY (N/A Abdomen) FLEXIBLE SIGMOIDOSCOPY (N/A )  Patient Location: PACU  Anesthesia Type:General  Level of Consciousness: awake and patient cooperative  Airway & Oxygen Therapy: Patient Spontanous Breathing and Patient connected to face mask  Post-op Assessment: Report given to RN and Post -op Vital signs reviewed and stable  Post vital signs: Reviewed and stable  Last Vitals:  Vitals Value Taken Time  BP 92/75 05/17/2019  4:49 PM  Temp    Pulse 72 05/17/2019  4:52 PM  Resp 11 05/17/2019  4:52 PM  SpO2 97 % 05/17/2019  4:52 PM  Vitals shown include unvalidated device data.  Last Pain:  Vitals:   05/17/19 0914  TempSrc: Oral         Complications: No apparent anesthesia complications

## 2019-05-17 NOTE — H&P (Signed)
CC: Here today for surgery  HPI: Heidi Whitaker is an 85 y.o. female who is here for Heidi Whitaker is a very pleasant 8yoF with hx of HTN, HLD, DM whom presented to Dr. Marius Ditch for evaluation of chronic diarrhea physical for the past couple of years. She also had a history of a positive cologuard test in 2017. She underwent colonoscopy on 01/09/2019 which was notable for: 1. Endoscopically unresectable polyp in the cecum-this was not biopsied or tattooed 2. endoscopically unresectable polyp in the rectum which was frond-like/villous and polypoid. This was read as being 15 cm from the anal verge and partially circumferential. this was however tattooed and biopsied. pathology returned tubulovillous adenoma without dysplasia or malignancy identified.  She that underwent CT scans of the abdomen and pelviswhich did not visualize the lesion seen in her colon and rectum. Diverticulosis noted. Gallstones noted.  Aside from diarrhea, she denied any symptoms related to this. She has no history of abdominal pain, nausea, vomiting. She does note that her stool has been mucousy for the past couple of years. Her diarrhea also began suddenly approximately 2-3 years ago. She has no history of diarrhea prior to all this.  She obtained cardiac clearance. She underwent MRI pelvis for this rectal polypoid mass. This demonstrated findings of probable invasion of the muscularis propria and was read as possible T3N0/1 rectosigmoid mass. Given these findings in the distance up in the rectum at the rectosigmoid, there were concerns for doing a transanal excision. Potentially, the need for intraperitoneal involvement. This was discussed with my partner, Dr. Johney Maine, who was present for her evaluation in the office. We presented her case at multidisciplinary tumor board conference. The consensus recommendation was to proceed with low anterior resection and NOT treat with neoadjuvant therapy. She was seen back  in the office with her daughter  PMH: HTN (well controlled on oral antihypertensive); HLD (well controlled on statin), DM (well controlled on oral hypoglycemics)  PSH: C-section x3 via low midline incisions.  FHx: Denies FHx of malignancy  Social: Denies use of tobacco/EtOH/drugs. She is retired.  ROS: A comprehensive 10 system review of systems was completed with the patient and pertinent findings as noted above.  Past Medical History:  Diagnosis Date  . Diabetes mellitus without complication ([place])   . Hard of hearing   . Hypertension     Past Surgical History:  Procedure Laterality Date  . CESAREAN SECTION    . COLONOSCOPY WITH PROPOFOL N/A 01/09/2019   Procedure: COLONOSCOPY WITH PROPOFOL;  Surgeon: Lin Landsman, MD;  Location: [facility] ENDOSCOPY;  Service: Gastroenterology;  Laterality: N/A;  . ESOPHAGOGASTRODUODENOSCOPY (EGD) WITH PROPOFOL N/A 01/09/2019   Procedure: ESOPHAGOGASTRODUODENOSCOPY (EGD) WITH PROPOFOL;  Surgeon: Lin Landsman, MD;  Location: [facility] ENDOSCOPY;  Service: Gastroenterology;  Laterality: N/A;  . KNEE SURGERY     left 2017/2018  . SHOULDER SURGERY     right    History reviewed. No pertinent family history.  Social:  reports that she has quit smoking. She quit after 4.00 years of use. She has never used smokeless tobacco. She reports that she does not drink alcohol or use drugs.  Allergies: No Known Allergies  Medications: I have reviewed the patient's current medications.  No results found for this or any previous visit (from the past 48 hour(s)).  No results found.  ROS - all of the below systems have been reviewed with the patient and positives are indicated with bold text General: chills, fever or night sweats Eyes:  blurry vision or double vision ENT: epistaxis or sore throat Allergy/Immunology: itchy/watery eyes or nasal congestion Hematologic/Lymphatic: bleeding problems, blood clots or swollen lymph nodes Endocrine:  temperature intolerance or unexpected weight changes Breast: new or changing breast lumps or nipple discharge Resp: cough, shortness of breath, or wheezing CV: chest pain or dyspnea on exertion GI: as per HPI GU: dysuria, trouble voiding, or hematuria MSK: joint pain or joint stiffness Neuro: TIA or stroke symptoms Derm: pruritus and skin lesion changes Psych: anxiety and depression  PE Blood pressure (!) 174/85, pulse 66, temperature 98.1 F (36.7 C), temperature source Oral, resp. rate 18, SpO2 98 %. Constitutional: NAD; conversant; no deformities Eyes: Moist conjunctiva; no lid lag; anicteric; PERRL Neck: Trachea midline; no thyromegaly Lungs: Normal respiratory effort; no tactile fremitus CV: RRR; no palpable thrills; no pitting edema GI: Abd soft, NT/ND; no palpable hepatosplenomegaly MSK: Normal gait; no clubbing/cyanosis Psychiatric: Appropriate affect; alert and oriented x3 Lymphatic: No palpable cervical or axillary lymphadenopathy  No results found for this or any previous visit (from the past 48 hour(s)).  No results found.   A/P: Heidi Whitaker is a very pleasant 80yoF with hx of HTN, HLD, DM here today for surgery for endoscopically unresectable polyp large polyp (bx thus far has shown TVA) in the rectum. She also has a carpeting polyp in her cecum  -I have had now multiple long discussions with her daughter, Heidi Whitaker and Heidi Whitaker. She has experienced incontinence of liquid stool and gas. She has had significant perianal excoriation/skin breakdown from all of this. We discussed that this component of her function will likely decline following a LAR and her incontinence will potentially worsen even if related to some degree to mucus. We also discussed potential for anastomotic complications in the pelvis which may necessitate an ileostomy. An alternative would be a permanent end colostomy which may actually improve her quality of life relative to proctectomy and  potential for LAR syndrome given her incontinence issues with liquid stool and gas. They are both in favor of an end colostomy for all of these reasons. We also discussed chemoXRT as a potential alternative with goal of organ preservation. She remains concerned about shuttling back and forth daily and the quality of life following all this if in fact she does not experience a complete response. -We discussed again the carpeting polypoid lesion in her cecum and performing a joint ileocecectomy. Both Debbie and Lundynn expressed that their goals at this point are to maintain as much quality of life as possible and if it's not causing issues to keep things in place. We discussed that this could contain foci of cancer as it was not biopsied and could additionally undergo malignant degeneration in future if it hasn't already. They both expressed understanding of this. We discussed the procedure of ileocecectomy, material risks of added procedure (including but not limited to pain, bleeding, infection, scarring, need for blood transfusion, damage to surrounding structures- blood vessels/nerves/viscus/organs, damage to ureter, urine leak, leak from anastomosis, need for additional procedures, need for ileostomy if a leak occurred, worsening of pre-existing medical conditions, AKI/renal issues, hernia, recurrence, pneumonia, heart attack, stroke, death - similar to complications of LAR). We discussed alternatives as noted above and risks therein. After considering these options have voiced that they would like the ileocecectomy NOT be performed today. They would like to observe this at this time recognizing risk of malignant transformation, spread, and potential need for additional surgeries down the road. -We will plan to proceed  with robotic-assisted low anterior resection with probable end colostomy. -The procedure as noted, material risks benefits and alternatives have been discussed again today. The patient and her  daughter's questions have been answered to their satisfaction, they voiced understanding and elected to proceed. -Debbie requested we update her at completion on her cell - [place]. Dema Severin, M.D. [facility]

## 2019-05-17 NOTE — Consult Note (Signed)
Medical Consultation   Heidi Whitaker  YPP:[id number]  DOB: 09/09/1929  DOA: 05/17/2019  PCP: McLean-Scocuzza, Nino Glow, MD   Outpatient Specialists:  Dr. Marius Ditch  Requesting physician: Dr. Nadeen Landau  Reason for consultation: Medical Management of Comorbid Conditions including HTN, HLD, Diabetes Mellitus   History of Present Illness:  Heidi Whitaker is an 85 y.o. female with a past medical history significant for but not limited to hypertension, hyperlipidemia, diabetes mellitus type 2 on oral hypoglycemics, along with obesity and chronic diarrhea who presented for an elective surgical procedure today.  Patient has been having chronic diarrhea for the past couple years and had a history of a positive Cologuard test in 2017 and underwent a colonoscopy earlier in January which was notable for an endoscopic clearly unresectable polyp in the cecum and endoscopic unresectable polyp in the rectum which was frond-like/villous and polypoid.  She underwent CT scans of the abdomen pelvis did not visualize lesion seen her colon and rectum.  She denied any abdominal pain, nausea or vomiting besides having chronic diarrhea and stated that her stools been mucousy in the past couple years.  She underwent cardiac clearance and underwent MRI pelvis for rectal polypoid mass and this demonstrated findings of probable invasion of the muscularis propria and was read as a rectosigmoid T3N 0/1 mass.  She presented to the general surgeon's office and the consensus was to proceed with a low anterior resection and not treated with neoadjuvant therapy.  He underwent a robotic assisted low anterior resection with a end colostomy today and was seen in the PACU at the request of Dr. Dema Severin for medical management for comorbid conditions.  After she had surgical intervention she underwent transient atrial fibrillation with RVR which was abated by a dose of IV metoprolol.  She is going to the stepdown  unit in stable condition.  Review of Systems:  Review of Systems  Constitutional: Negative.   HENT: Positive for hearing loss.   Eyes: Negative.   Respiratory: Negative.   Cardiovascular: Negative.   Gastrointestinal: Positive for abdominal pain.  Genitourinary: Negative.   Musculoskeletal: Negative.   Skin: Negative.   Neurological: Negative.   Endo/Heme/Allergies: Negative.   Psychiatric/Behavioral: Negative.   All other systems reviewed and are negative.  As per HPI otherwise 10 point review of systems negative.   Past Medical History: Past Medical History:  Diagnosis Date   Diabetes mellitus without complication ([place])    Hard of hearing    Hypertension    Past Surgical History: Past Surgical History:  Procedure Laterality Date   CESAREAN SECTION     COLONOSCOPY WITH PROPOFOL N/A 01/09/2019   Procedure: COLONOSCOPY WITH PROPOFOL;  Surgeon: Lin Landsman, MD;  Location: [facility] ENDOSCOPY;  Service: Gastroenterology;  Laterality: N/A;   ESOPHAGOGASTRODUODENOSCOPY (EGD) WITH PROPOFOL N/A 01/09/2019   Procedure: ESOPHAGOGASTRODUODENOSCOPY (EGD) WITH PROPOFOL;  Surgeon: Lin Landsman, MD;  Location: [facility] ENDOSCOPY;  Service: Gastroenterology;  Laterality: N/A;   KNEE SURGERY     left 2017/2018   SHOULDER SURGERY     right   Allergies:  No Known Allergies  Social History:  reports that she has quit smoking. She quit after 4.00 years of use. She has never used smokeless tobacco. She reports that she does not drink alcohol or use drugs.  Family History: Attempted but patient is too drowsy to respond and extremely hadr of hearing.  Physical Exam: Vitals:  05/17/19 0914 05/17/19 1649 05/17/19 1700  BP: (!) 174/85 92/75 100/69  Pulse: 66 73 72  Resp: 18 19 13   Temp: 98.1 F (36.7 C) (!) 97.5 F (36.4 C)   TempSrc: Oral    SpO2: 98% 97% 98%    Constitutional: Elderly Fatigued appearing obese Caucasian female who is awake but appears very drowsy not  in any acute distress. Eyes Anicteric sclera,  ENMT: external ears and nose appear normal, Extremely hard of Hearing. Lips appears normal, oropharynx mucosa, tongue, posterior pharynx appear normal  Neck: neck appears normal, no masses, normal ROM, no thyromegaly, no JVD  CVS: S1-S2 clear, no murmur rubs or gallops, no LE edema, normal pedal pulses  Respiratory:  Diminished to auscultation bilaterally, no wheezing, rales or rhonchi. Respiratory effort normal. No accessory muscle use. Wearing Supplemental O2 via [place] Abdomen: soft nontender, nondistended, normal bowel sounds, no hepatosplenomegaly, no hernias  Musculoskeletal: No cyanosis, clubbing or edema noted bilaterally Neuro: Cranial nerves II-XII intact, Psych: judgement and insight appear impaired, stable mood and affect, drowsy mental status Skin: Has a new colostomy in place.  Data reviewed:  I have personally reviewed following labs and imaging studies Labs:  CBC: Recent Labs  Lab 05/11/19 1054  WBC 11.2*  NEUTROABS 5.3  HGB 13.6  HCT 41.7  MCV 95.0  PLT 914    Basic Metabolic Panel: Recent Labs  Lab 05/11/19 1054  NA 139  K 4.7  CL 105  CO2 28  GLUCOSE 81  BUN 21  CREATININE 1.00  CALCIUM 9.7   GFR Estimated Creatinine Clearance: 42.4 mL/min (by C-G formula based on SCr of 1 mg/dL). Liver Function Tests: Recent Labs  Lab 05/11/19 1054  AST 17  ALT 12  ALKPHOS 44  BILITOT 0.5  PROT 7.2  ALBUMIN 4.0   No results for input(s): LIPASE, AMYLASE in the last 168 hours. No results for input(s): AMMONIA in the last 168 hours. Coagulation profile Recent Labs  Lab 05/11/19 1054  INR 1.0    Cardiac Enzymes: No results for input(s): CKTOTAL, CKMB, CKMBINDEX, TROPONINI in the last 168 hours. BNP: Invalid input(s): POCBNP CBG: Recent Labs  Lab 05/11/19 1017 05/17/19 0927 05/17/19 1648  GLUCAP 93 120* 200*   D-Dimer No results for input(s): DDIMER in the last 72 hours. Hgb A1c No results for  input(s): HGBA1C in the last 72 hours. Lipid Profile No results for input(s): CHOL, HDL, LDLCALC, TRIG, CHOLHDL, LDLDIRECT in the last 72 hours. Thyroid function studies No results for input(s): TSH, T4TOTAL, T3FREE, THYROIDAB in the last 72 hours.  Invalid input(s): FREET3 Anemia work up No results for input(s): VITAMINB12, FOLATE, FERRITIN, TIBC, IRON, RETICCTPCT in the last 72 hours. Urinalysis    Component Value Date/Time   COLORURINE YELLOW 10/04/2018 0818   APPEARANCEUR CLEAR 10/04/2018 0818   LABSPEC 1.012 10/04/2018 0818   PHURINE < OR = 5.0 10/04/2018 0818   GLUCOSEU NEGATIVE 10/04/2018 0818   HGBUR 1+ (A) 10/04/2018 0818   KETONESUR NEGATIVE 10/04/2018 0818   PROTEINUR TRACE (A) 10/04/2018 0818   NITRITE NEGATIVE 10/04/2018 0818   LEUKOCYTESUR 3+ (A) 10/04/2018 0818   Microbiology Recent Results (from the past 240 hour(s))  Novel Coronavirus, NAA (hospital order; send-out to ref lab)     Status: None   Collection Time: 05/11/19 11:43 AM  Result Value Ref Range Status   SARS-CoV-2, NAA NOT DETECTED NOT DETECTED Final    Comment: (NOTE) This test was developed and its performance characteristics determined by [employer].  This test has not been FDA cleared or approved. This test has been authorized by FDA under an Emergency Use Authorization (EUA). This test is only authorized for the duration of time the declaration that circumstances exist justifying the authorization of the emergency use of in vitro diagnostic tests for detection of SARS-CoV-2 virus and/or diagnosis of COVID-19 infection under section 564(b)(1) of the Act, 21 U.S.C. 381OFB-5(Z)(0), unless the authorization is terminated or revoked sooner. When diagnostic testing is negative, the possibility of a false negative result should be considered in the context of a patient's recent exposures and the presence of clinical signs and symptoms consistent with COVID-19. An individual without symptoms  of COVID-19 and who is not shedding SARS-CoV-2 virus would expect to have a negative (not detected) result in this assay. Performed  At: [facility] [address] [place], [place] [id number] Rush Farmer MD UM:[id number]    [place]  Final    Comment: Performed at [facility], [place] [address] [place], [place] [address]   Inpatient Medications:   Scheduled Meds:  bupivacaine liposome  20 mL Infiltration Once   Continuous Infusions:  lactated ringers 50 mL/hr at 05/17/19 7619   Radiological Exams on Admission: No results found.  Impression/Recommendations Active Problems:   Rectal mass  Rectal Mass s/p Robotic LOA with End Colostomy -POD0 -Further Care per General Surgery including Pain Control with Oxyxodone and Fentanyl  -DVT Prophylaxis per Surgery   Hypertension -Blood Pressure appears Stable currently at 108/59 -Antihypertensives have been held but are planning to be resumed in AM -Currently Holding Lisinopril and to be resumed in AM  -Metoprolol 100 mg po Daily to be resumed in AM per General Surgery -Continue to Monitor BP carefully  HLD -Agree with Resuming Lovastatin substitution with Pravastatin 20 mg po Daily  Diabetes Mellitus Type 2 -Currently holding Glimperide 1 tab (2 mg po Daily) -Agree with Starting on   Transient A Fib with RVR now in NSR -Converted to NSR after given a dose of IV Metoprolol -Continue to Monitor on Telemetry -Likely related to Surgical Intervention -CHA2DS2-VASc of 5 (Age, Sex, HTN, Diabetes) so is at a high risk for CVA -Since it was related to Surgical intervention and short-lived will hold off on Starting Anticoagulation currently, especially since she just had surgery -IF patient goes back into Atrial Fibrillation will need Anticoagulation -Cycle Cardiac Troponin's  -Will need to review ECHO done for Pre-operative Clearance -Will need to Request Records from [facility]    -Recent Stress Test showed LVEF of 66%; Read as intermediate to Low risk Scan as there was borderline normal myocardial perfusion as there was a small area of apical defect possibly apical thinning and no clear evidence of ischemia  -Will hold off on Anticoagulation currently and patient will need to follow up with Cardiology as an outpatient for further workup -Resume Home Metoprolol in AM  -If goes back into Atrial Fibrillation here will need Inpatient ECHO and Cardiac Consultation.   Obesity -Estimated body mass index is 33.04 kg/m as calculated from the following:   Height as of 05/11/19: 5\' 4"  (1.626 m).   Weight as of 05/11/19: 87.3 kg. -Weight Loss and Dietary Counseling Given -Will need Nutritionist Evaluation when Diet is advanced.   Thank you for this consultation.  Our [facility] hospitalist team will follow the patient with you.  Time Spent: [place] D.O. [facility] 05/17/2019, 5:10 PM

## 2019-05-17 NOTE — Op Note (Addendum)
05/17/2019  4:46 PM  PATIENT:  Heidi Whitaker  85 y.o. female  Patient Care Team: McLean-Scocuzza, Nino Glow, MD as PCP - General (Internal Medicine)  PRE-OPERATIVE DIAGNOSIS: Rectal mass  POST-OPERATIVE DIAGNOSIS:  Same  PROCEDURE: 1. Robotic assisted low anterior resection with end colostomy 2. Flexible sigmoidoscopy  SURGEON:  Sharon [place]ie Dillingham, MD  ASSISTANT: Leighton Ruff, MD  ANESTHESIA:   general  COUNTS:  Sponge, needle and instrument counts were reported correct x2 at the conclusion of the operation.  EBL: 50 mL  DRAINS: None  SPECIMEN: Rectum and distal sigmoid colon  COMPLICATIONS: None  FINDINGS: Flex sig used to confirm location of tattoo and lesion in rectum. The cecum appeared normal. The liver and peritoneum appeared normal. She had significant skin breakdown around the anus, buttocks and vagina related to her liquid incontinence. The distal margin was grossly 3 cm. Given her advanced age, incontinence (including substantial skin breakdown) and concerns for toleration of any anastomotic leaks, the decision was made to proceed with end colostomy as previously discussed.  DISPOSITION: PACU in satisfactory condition  INDICATION: Heidi Whitaker is an 85 y.o. female who is here for Heidi Whitaker is a very pleasant 48yoF with hx of HTN, HLD, DM whom presented to Dr. Marius Ditch for evaluation of chronic diarrhea physical for the past couple of years. She also had a history of a positive cologuard test in 2017. She underwent colonoscopy on 01/09/2019 which was notable for: 1. Endoscopically unresectable polyp in the cecum-this was not biopsied or tattooed 2. endoscopically unresectable polyp in the rectum which was frond-like/villous and polypoid. This was read as being 15 cm from the anal verge and partially circumferential. this was however tattooed and biopsied. pathology returned tubulovillous adenoma without dysplasia or malignancy identified.  She that  underwent CT scans of the abdomen and pelviswhich did not visualize the lesion seen in her colon and rectum. Diverticulosis noted. Gallstones noted.  Aside from diarrhea, she denied any symptoms related to this. She has no history of abdominal pain, nausea, vomiting. She does note that her stool has been mucousy for the past couple of years. Her diarrhea also began suddenly approximately 2-3 years ago. She has no history of diarrhea prior to all this.  She obtained cardiac clearance. She underwent MRI pelvis for this rectal polypoid mass. This demonstrated findings of probable invasion of the muscularis propria and was read as possible T3N0/1 rectosigmoid mass. Given these findings in the distance up in the rectum at the rectosigmoid, there were concerns for doing a transanal excision. Potentially, the need for intraperitoneal involvement. We presented her case at multidisciplinary tumor board conference. The consensus recommendation was to proceed with low anterior resection and NOT treat with neoadjuvant therapy. She was seen back in the office with her daughter. Discussions ultimately regarding final plans. Ultimately, we planned robotic assisted LAR with end colostomy. The lesion in the cecum was planned for observation after discussing surgical options - focusing on minimizing further procedural related risks - please refer to documentation elsewhere for details regarding this discussion.  DESCRIPTION: The patient was identified in preop holding and taken to the OR where she was placed on the operating room table. SCDs were placed. General endotracheal anesthesia was induced without difficulty. She was then positioned in lithotomy with Allen stirrups. Arms were tucked. Pressure points were padded. A foley catheter was placed under sterile conditions. She was then prepped and draped in the usual sterile fashion. A surgical timeout was performed indicating the  correct patient, procedure,  positioning and need for preoperative antibiotics.   An OG tube was in place and to suction by anesthesia.  A Veress needle was used to access the peritoneal cavity at Palmer's point.  Intraperitoneal location was confirmed by aspiration and saline drop test.  The peritoneal cavity then insufflated CO2 to a maximum pressure of 15 mmHg.  In the right upper quadrant, an 8 mm robotic trocar was carefully placed.  The laparoscope was inserted in the peritoneal cavity and intraperitoneal inspection failed no evidence of trocar site or Veress needle site complications.  Bilateral transversus abdominis plane blocks were then created using a dilute mixture of Exparel and Marcaine.  Following this, 3 additional 8 mm trochars were placed across the abdomen under direct visualization.  A 5 mm Hassan trocar was placed in the right lower quadrant.  An additional 12 mm robotic trochars placed at the planned colostomy site.  She was then placed in Trendelenburg.  Small bowel was swept out of the pelvis.  The sigmoid and rectum were inspected.  Initially, no tattoo was identifiable.  For this reason, the decision was made to perform a flexible sigmoidoscopy  The sigmoid/descending junction was then gently occluded.  I then went below and performed a flexible sigmoidoscopy to confirm location of the lesion.  Sigmoidoscope was inserted into the rectum and passed under direct visualization up into the mid sigmoid colon.  This was then carefully withdrawn.  Within the proximal rectum, this lesion is clear identified.  There was evidence of tattoo approximately 3 cm distal to this lesion.  This was fairly subtle.  The rectum was desufflated and the sigmoidoscope was removed.  I then scrubbed back in.  The robot was docked.  I then went to the console.  The rectosigmoid colon was grasped and elevated anteriorly.  The peritoneum overlying the sacral promontory the right side was incised.  The avascular plane between the fascia  propria of the rectum and the presacral fascia was identified.  This plane was developed and into the pelvis.  Working posteriorly first, the hypogastric nerves were cleared identified and preserved.  After developing this plane into the midportion of the pelvis, attention was turned to identification of the IMA pedicle and left ureter.  The peritoneum overlying the pedicle was incised.  The left ureter was not clearly identifiable in this location as she had a fairly wide pelvis.  For this reason, a lateral to medial approach was selected.  The rectosigmoid colon was then retracted medially.  The intersigmoid fossa was identified -peritoneum at this location was incised.  The sigmoid was then reflected medially.  The left ureter was clearly identified and preserved free of injury.  The sigmoid mesocolon was carefully dissected free of the left ureter until the entire IMA pedicle and then medialized and the left ureter was well out of our plane of dissection.  The gonadal vessels were lateral to all this and also preserved free of injury.  The IMA pedicle was then circumferentially dissected.  Approximately 1 cm from its takeoff from the aorta, it was ligated using the vessel sealer.  The IMA pedicle was then observed and noted to be hemostatic.  The remaining sigmoid and proximal half of the descending colon was then mobilized along the Lareina Espino line of Toldt.  Following this, the mesentery was reflected medially and completely mobilized.  Given the anticipated colostomy, splenic flexure was left in situ.  Attention was then turned to continuing the mesorectal dissection.  Working posteriorly first, the avascular plane between the fascia propria of the rectum and presacral fascia was developed.  Again protecting the hypogastric nerves.  Plan continue down into the deep pelvis.  The tattoo was identifiable.  The peritoneum laterally was then incised.  Care again taken to protect both the left and right ureters during  this dissection.  After completing dissection laterally, the anterior portion of the peritoneal reflection was incised.  This plane dissection had continued from the lateral portion onto the anterior portion.  Care was taken to preserve the vagina free of injury.  The polypoid lesion was located in the proximal portion of the rectum.  The tattoo was noted to be at least 3 cm below the lesion.  A tattoo was therefore selected as the site for the distal margin.  The mesorectum of this level was then cleared using the vessel sealer.  The rectum at this level had been skeletonized.  This was then divided using 2 firings of the 45 mm robotic stapler with a green load.  The stump was inspected and noted to be intact and hemostatic.  Attention was then turned to the proximal point of division.  The IMA pedicle was identified and preserved as part of the specimen.  The mesentery was divided using the vessel sealer out to the level of the planned point of transection. There was a strong pulse in the marginal at this level. This corresponded to the proximal sigmoid colon. The mesentery was reinspected and noted to be hemostatic. The pelvis was hemostatic. At this point, attention was turned to extraction of the specimen.  The robot was undocked. The 12 mm trocar was removed.  The disc of skin at this location was excised.  The subcutaneous tissue was excised at this location as well.  The rectus muscle was identified and the trocar fasciotomy was extended medially and laterally.  An Alexis wound protector was placed at the site. The specimen was eviscerated. The proximal point of transection identified. A clamp was applied at this level and the specimen transected and passed off, open end is proximal. The abdomen was reinsufflated and colostomy orientation confirmed such that there was no twisting or tension which there was none.  Trochars were removed under direct visualization.  Port sites were hemostatic.  The abdomen  had been desufflated.  Trocar sites were then closed using 4-0 Monocryl subcuticular sutures and Dermabond applied.  Two sutures were placed laterally in the rectus fascia at the colostomy site using #1 novafil as the colostomy site was somewhat loose. Following this, it was appropriately snug but not constricting. The colostomy was then partially Brooked using 3-0 vicryl sutures.  An ostomy appliance was then placed.  She was then awakened from anesthesia, extubated, and transferred to a stretcher for transport to PACU in satisfactory condition.

## 2019-05-17 NOTE — Anesthesia Procedure Notes (Signed)
Procedure Name: Intubation Date/Time: 05/17/2019 12:38 PM Performed by: Claudia Desanctis, CRNA Pre-anesthesia Checklist: Patient identified, Emergency Drugs available, Suction available and Patient being monitored Patient Re-evaluated:Patient Re-evaluated prior to induction Oxygen Delivery Method: Circle system utilized Preoxygenation: Pre-oxygenation with 100% oxygen Induction Type: IV induction Ventilation: Mask ventilation without difficulty Laryngoscope Size: 2 and Miller Grade View: Grade I Tube type: Oral Tube size: 7.0 mm Number of attempts: 1 Airway Equipment and Method: Stylet Placement Confirmation: ETT inserted through vocal cords under direct vision,  positive ETCO2 and breath sounds checked- equal and bilateral Secured at: 21 cm Tube secured with: Tape Dental Injury: Teeth and Oropharynx as per pre-operative assessment

## 2019-05-17 NOTE — Consult Note (Signed)
[facility] Nurse requested for preoperative stoma site marking  Discussed surgical procedure and stoma creation with patient.  Explained role of the [place] nurse team. Demonstrated pouch appearance.  Answered patient questions.   Examined patient sitting on the bedside  in order to place the marking in the patient's visual field, away from any creases or abdominal contour issues and within the rectus muscle.  Attempted to mark below the patient's belt line. There are significant creases which occur above and below the stoma site markings when patient sits forward, and should be avoided if possible.  Marked for colostomy in the LLQ  __5__ cm to the left of the umbilicus and ___1_QX below the umbilicus.  Marked for ileostomy in the RLQ  _5___cm to the right of the umbilicus and  __4__ cm above/below the umbilicus.  Patient's abdomen cleansed with CHG wipes at site markings, allowed to air dry prior to marking. Pt plans for surgery to occur this am.   [facility] Nurse team will follow up on Fri with patient after surgery for continue ostomy care and teaching if ostomy creation is performed.   Julien Girt MSN, RN, [place], [place], [place]

## 2019-05-18 ENCOUNTER — Encounter (HOSPITAL_COMMUNITY): Payer: Self-pay | Admitting: Surgery

## 2019-05-18 ENCOUNTER — Other Ambulatory Visit: Payer: Self-pay

## 2019-05-18 DIAGNOSIS — N183 Chronic kidney disease, stage 3 (moderate): Secondary | ICD-10-CM

## 2019-05-18 DIAGNOSIS — D72829 Elevated white blood cell count, unspecified: Secondary | ICD-10-CM

## 2019-05-18 DIAGNOSIS — D649 Anemia, unspecified: Secondary | ICD-10-CM

## 2019-05-18 LAB — CBC WITH DIFFERENTIAL/PLATELET
Abs Immature Granulocytes: 0.05 10*3/uL (ref 0.00–0.07)
Basophils Absolute: 0 10*3/uL (ref 0.0–0.1)
Basophils Relative: 0 %
Eosinophils Absolute: 0.1 10*3/uL (ref 0.0–0.5)
Eosinophils Relative: 0 %
HCT: 37.7 % (ref 36.0–46.0)
Hemoglobin: 11.6 g/dL — ABNORMAL LOW (ref 12.0–15.0)
Immature Granulocytes: 0 %
Lymphocytes Relative: 18 %
Lymphs Abs: 2.7 10*3/uL (ref 0.7–4.0)
MCH: 29.4 pg (ref 26.0–34.0)
MCHC: 30.8 g/dL (ref 30.0–36.0)
MCV: 95.4 fL (ref 80.0–100.0)
Monocytes Absolute: 1.9 10*3/uL — ABNORMAL HIGH (ref 0.1–1.0)
Monocytes Relative: 13 %
Neutro Abs: 10.5 10*3/uL — ABNORMAL HIGH (ref 1.7–7.7)
Neutrophils Relative %: 69 %
Platelets: 224 10*3/uL (ref 150–400)
RBC: 3.95 MIL/uL (ref 3.87–5.11)
RDW: 13.2 % (ref 11.5–15.5)
WBC: 15.3 10*3/uL — ABNORMAL HIGH (ref 4.0–10.5)
nRBC: 0 % (ref 0.0–0.2)

## 2019-05-18 LAB — GLUCOSE, CAPILLARY
Glucose-Capillary: 105 mg/dL — ABNORMAL HIGH (ref 70–99)
Glucose-Capillary: 167 mg/dL — ABNORMAL HIGH (ref 70–99)
Glucose-Capillary: 101 mg/dL — ABNORMAL HIGH (ref 70–99)
Glucose-Capillary: 114 mg/dL — ABNORMAL HIGH (ref 70–99)

## 2019-05-18 LAB — COMPREHENSIVE METABOLIC PANEL
ALT: 12 U/L (ref 0–44)
AST: 18 U/L (ref 15–41)
Albumin: 3.2 g/dL — ABNORMAL LOW (ref 3.5–5.0)
Alkaline Phosphatase: 37 U/L — ABNORMAL LOW (ref 38–126)
Anion gap: 6 (ref 5–15)
BUN: 17 mg/dL (ref 8–23)
CO2: 25 mmol/L (ref 22–32)
Calcium: 8.5 mg/dL — ABNORMAL LOW (ref 8.9–10.3)
Chloride: 105 mmol/L (ref 98–111)
Creatinine, Ser: 1.1 mg/dL — ABNORMAL HIGH (ref 0.44–1.00)
GFR calc Af Amer: 52 mL/min — ABNORMAL LOW (ref >60)
GFR calc non Af Amer: 45 mL/min — ABNORMAL LOW (ref >60)
Glucose, Bld: 140 mg/dL — ABNORMAL HIGH (ref 70–99)
Potassium: 4.7 mmol/L (ref 3.5–5.1)
Sodium: 136 mmol/L (ref 135–145)
Total Bilirubin: 0.8 mg/dL (ref 0.3–1.2)
Total Protein: 5.9 g/dL — ABNORMAL LOW (ref 6.5–8.1)

## 2019-05-18 LAB — MAGNESIUM: Magnesium: 2 mg/dL (ref 1.7–2.4)

## 2019-05-18 LAB — TROPONIN I
Troponin I: <0.03 ng/mL (ref <0.03)
Troponin I: <0.03 ng/mL (ref <0.03)

## 2019-05-18 LAB — PHOSPHORUS: Phosphorus: 3.9 mg/dL (ref 2.5–4.6)

## 2019-05-18 NOTE — Consult Note (Signed)
WOC follow-up: Pt had colostomy surgery performed yesterday. Progress notes indicate ostomy pouch is intact with a good seal and no stool or flatus. [facility] team is working remotely today and will plan to demonstrate pouch change and perform first post-op teaching session tomorrow, 5/22. Julien Girt MSN, RN, [place], [place], [place]

## 2019-05-18 NOTE — Evaluation (Signed)
Physical Therapy Evaluation Patient Details Name: Heidi Whitaker MRN: [id number] DOB: Apr 07, 1929 Today's Date: 05/18/2019   History of Present Illness  s/p colostomy due to rectal mass.  PMH:  HTN and DM  Clinical Impression  Pt s/p colostomy and presenting with functional mobility limitations 2* generalized weakness, post op pain and mild balance deficits.  Pt should progress to dc home with family assist.    Follow Up Recommendations Home health PT;Supervision/Assistance - 24 hour    Equipment Recommendations  None recommended by PT    Recommendations for Other Services       Precautions / Restrictions Precautions Precautions: Fall Restrictions Weight Bearing Restrictions: No      Mobility  Bed Mobility Overal bed mobility: Needs Assistance Bed Mobility: Supine to Sit     Supine to sit: Min assist;+2 for physical assistance;+2 for safety/equipment;Mod assist     General bed mobility comments: attempted rolling, but pt had too much pain.  Brought HOB up and mod A for sitting EOB  Transfers Overall transfer level: Needs assistance Equipment used: Rolling walker (2 wheeled) Transfers: Sit to/from Stand Sit to Stand: Min assist;+2 safety/equipment         General transfer comment: assist to rise and steady from raised bed.  Took a few steps to step to chair  Ambulation/Gait Ambulation/Gait assistance: Min assist;+2 physical assistance;+2 safety/equipment Gait Distance (Feet): 4 Feet Assistive device: Rolling walker (2 wheeled) Gait Pattern/deviations: Step-to pattern;Decreased step length - right;Decreased step length - left;Shuffle;Trunk flexed Gait velocity: decr   General Gait Details: cues for posture and position from [employer]            Wheelchair Mobility    Modified Rankin (Stroke Patients Only)       Balance Overall balance assessment: Needs assistance Sitting-balance support: No upper extremity supported;Feet supported Sitting  balance-Leahy Scale: Fair     Standing balance support: Bilateral upper extremity supported Standing balance-Leahy Scale: Poor                               Pertinent Vitals/Pain Pain Assessment: Faces Faces Pain Scale: Hurts whole lot Pain Location: abdomen with movement Pain Descriptors / Indicators: Aching Pain Intervention(s): Limited activity within patient's tolerance;Monitored during session    [place] expects to be discharged to:: Private residence Living Arrangements: Children Available Help at Discharge: Family Type of Home: House Home Access: Stairs to enter Entrance Stairs-Rails: Right;Left;Can reach both Entrance Stairs-Number of Steps: 4-5 can reach both handrails Home Layout: Two level Home Equipment: Walker - 2 wheels;Cane - single point;Toilet riser;Grab bars - tub/shower Additional Comments: daughter home during the day.  Son in Sports coach and grandson also there    Prior Function Level of Independence: Independent               Journalist, newspaper        Extremity/Trunk Assessment   Upper Extremity Assessment Upper Extremity Assessment: Generalized weakness    Lower Extremity Assessment Lower Extremity Assessment: Generalized weakness       Communication   Communication: HOH  Cognition Arousal/Alertness: Awake/alert Behavior During Therapy: WFL for tasks assessed/performed Overall Cognitive Status: Within Functional Limits for tasks assessed                                        General Comments  Exercises     Assessment/Plan    PT Assessment Patient needs continued PT services  PT Problem List Decreased strength;Decreased activity tolerance;Decreased balance;Decreased mobility;Decreased knowledge of use of DME;Pain       PT Treatment Interventions DME instruction;Gait training;Stair training;Functional mobility training;Therapeutic activities;Therapeutic exercise;Patient/family  education    PT Goals (Current goals can be found in the Care Plan section)  Acute Rehab PT Goals Patient Stated Goal: less pain; return to PLOF PT Goal Formulation: With patient Time For Goal Achievement: 06/01/19 Potential to Achieve Goals: Good    Frequency Min 3X/week   Barriers to discharge        Co-evaluation PT/OT/SLP Co-Evaluation/Treatment: Yes Reason for Co-Treatment: For patient/therapist safety PT goals addressed during session: Mobility/safety with mobility OT goals addressed during session: ADL's and self-care       AM-PAC PT "6 Clicks" Mobility  Outcome Measure Help needed turning from your back to your side while in a flat bed without using bedrails?: A Lot Help needed moving from lying on your back to sitting on the side of a flat bed without using bedrails?: A Lot Help needed moving to and from a bed to a chair (including a wheelchair)?: A Lot Help needed standing up from a chair using your arms (e.g., wheelchair or bedside chair)?: A Little Help needed to walk in hospital room?: A Little Help needed climbing 3-5 steps with a railing? : A Lot 6 Click Score: 14    End of Session Equipment Utilized During Treatment: Gait belt Activity Tolerance: Patient limited by pain Patient left: in chair;with call bell/phone within reach;with chair alarm set Nurse Communication: Mobility status PT Visit Diagnosis: Unsteadiness on feet (R26.81);Muscle weakness (generalized) (M62.81);Pain;Difficulty in walking, not elsewhere classified (R26.2) Pain - part of body: (abdomen)    Time: 1105-1130 PT Time Calculation (min) (ACUTE ONLY): 25 min   Charges:   PT Evaluation $PT Eval Low Complexity: 1 Low          Debe Coder PT Acute Rehabilitation Services Pager [phone] Office [phone]   Tacari Repass 05/18/2019, 2:04 PM

## 2019-05-18 NOTE — Progress Notes (Signed)
PROGRESS NOTE    Heidi Whitaker  DDU:[id number] DOB: 05-07-1929 DOA: 05/17/2019 PCP: McLean-Scocuzza, Nino Glow, MD  Brief Narrative:  Heidi Whitaker is an 85 y.o. female with a past medical history significant for but not limited to hypertension, hyperlipidemia, diabetes mellitus type 2 on oral hypoglycemics, along with obesity and chronic diarrhea who presented for an elective surgical procedure today.  Patient has been having chronic diarrhea for the past couple years and had a history of a positive Cologuard test in 2017 and underwent a colonoscopy earlier in January which was notable for an endoscopic clearly unresectable polyp in the cecum and endoscopic unresectable polyp in the rectum which was frond-like/villous and polypoid.  She underwent CT scans of the abdomen pelvis did not visualize lesion seen her colon and rectum.  She denied any abdominal pain, nausea or vomiting besides having chronic diarrhea and stated that her stools been mucousy in the past couple years.  She underwent cardiac clearance and underwent MRI pelvis for rectal polypoid mass and this demonstrated findings of probable invasion of the muscularis propria and was read as a rectosigmoid T3N 0/1 mass.  She presented to the general surgeon's office and the consensus was to proceed with a low anterior resection and not treated with neoadjuvant therapy.  She underwent a robotic assisted low anterior resection with a end colostomy yesterday and was seen in the PACU at the request of Dr. Dema Severin for medical management for comorbid conditions.  After she had surgical intervention she underwent transient atrial fibrillation with RVR which was abated by a dose of IV metoprolol.  She went to the stepdown unit in stable condition.  Currently POD1 and being transferred to Medical Floor.   Assessment & Plan:   Active Problems:   Rectal mass  Rectal Mass s/p Robotic LOA with End Colostomy -POD1 -Further Care per General  Surgery including Pain Control with Oxyxodone and Fentanyl  -Diet Advancement per General Surgery and advancing to [place] -[place] nurse consulted   -DVT Prophylaxis per Surgery   Hypertension -Blood Pressure appears Stable currently at 110/74 -Antihypertensives have been held but are planning to be resumed -Currently Holding Lisinopril again today due to slight bump in Cr -Metoprolol 100 mg po Daily to be resumed this AM per General Surgery -Continue to Monitor BP carefully  HLD -Agree with Resuming Lovastatin substitution with Pravastatin 20 mg po Daily  Diabetes Mellitus Type 2 -Currently holding Glimperide 1 tab (2 mg po Daily) -Agree with Starting on Sensitive Novolog SSI AC/HS -CBG's currently ranging from 120-200 -Continue to Monitor and Trend CBG's; This AM Blood Glucose was 140 on CMP   Transient A Fib with RVR now in NSR -Converted to NSR after given a dose of IV Metoprolol -Continue to Monitor on Telemetry -Likely related to Surgical Intervention -CHA2DS2-VASc of 5 (Age, Sex, HTN, Diabetes) so is at a high risk for CVA -Since it was related to Surgical intervention and short-lived will hold off on Starting Anticoagulation currently, especially since she just had surgery -IF patient goes back into Atrial Fibrillation will need Anticoagulation -Cycle Cardiac Troponin's; Troponin was <0.03 x2  -Will need to review ECHO done for Pre-operative Clearance -Will need to Request Records from [facility]  -Recent Stress Test showed LVEF of 66%; Read as intermediate to Low risk Scan as there was borderline normal myocardial perfusion as there was a small area of apical defect possibly apical thinning and no clear evidence of ischemia  -Will hold off on Anticoagulation currently  and patient will need to follow up with Cardiology as an outpatient for further workup -Resumed Home Metoprolol this AM  -If goes back into Atrial Fibrillation here will need Inpatient ECHO and Cardiac  Consultation.   Obesity -Estimated body mass index is 34.17 kg/m as calculated from the following:   Height as of this encounter: 5\' 4"  (1.626 m).   Weight as of this encounter: 90.3 kg. -Weight Loss and Dietary Counseling Given -Will need Nutritionist Evaluation when Diet is advanced.   CKD Stage 3 -Cr Slightly worse than yesterday; BUN/Cr went from 21/1.00 -> 17/1.10 -BP fairly well controlled so will hold Lisinopril at this time; Avoid Hypotension -C/w Lactated Ringers at 75 mL/hr for now and D/C per General Surgery Protocol -Continue to monitor and Trend Renal Fxn -Repeat CMP in AM and if Cr improved resume Lisinopril   Normocytic Anemia -Hgb/Hct went from 13.6/41.7 -> 11.6/37.7 -Likely expected Post-operative Drop -Check Anemia Panel in the AM -Continue to Monitor for S/Sx of Bleeding -Repeat CBC in AM  Leukocytosis -Patient's WBC went from 11.2 -> 17.6 -> 15.3 -Trending down now after Surgery -Continue to Monitor and Trend WBC -Repeat CBC in AM   DVT prophylaxis: Heparin 5,000 units sq q8h Code Status: FULL CODE Family Communication: No Family present at bedside  Disposition Plan: Per Primary Team   Consultants:  [facility] is Primary    Procedures:  1. Robotic assisted low anterior resection with end colostomy 2. Flexible sigmoidoscopy   Antimicrobials:  Anti-infectives (From admission, onward)   Start     Dose/Rate Route Frequency Ordered Stop   05/17/19 1400  neomycin (MYCIFRADIN) tablet 1,000 mg  Status:  Discontinued     1,000 mg Oral 3 times per day 05/17/19 0842 05/17/19 0849   05/17/19 1400  metroNIDAZOLE (FLAGYL) tablet 1,000 mg  Status:  Discontinued     1,000 mg Oral 3 times per day 05/17/19 0842 05/17/19 0849   05/17/19 0900  cefoTEtan (CEFOTAN) 2 g in sodium chloride 0.9 % 100 mL IVPB     2 g 200 mL/hr over 30 Minutes Intravenous On call to O.R. 05/17/19 0845 05/17/19 1303   05/17/19 0845  cefoTEtan (CEFOTAN) 2 g in sodium chloride  0.9 % 100 mL IVPB  Status:  Discontinued     2 g 200 mL/hr over 30 Minutes Intravenous On call to O.R. 05/17/19 9381 05/17/19 0843   05/17/19 0845  cefoTEtan (CEFOTAN) 2 g in sodium chloride 0.9 % 100 mL IVPB  Status:  Discontinued     2 g 200 mL/hr over 30 Minutes Intravenous On call to O.R. 05/17/19 0175 05/17/19 1025     Subjective: Seen and examined at bedside she is doing better today.  Denying chest pain, lightheadedness or dizziness.  No nausea or vomiting.  She is not as drowsy today and she was able to interact and answer questions appropriately.  She is still slightly hard of hearing.  States that she is from [place].  No other concerns or complaints at this time.  Objective: Vitals:   05/18/19 0400 05/18/19 0500 05/18/19 0600 05/18/19 0700  BP: (!) 108/49 (!) 111/39 110/74   Pulse: (!) 55 (!) 54 (!) 53   Resp: 20 18 17    Temp: 97.6 F (36.4 C)     TempSrc: Axillary     SpO2: 95% 92% 92% 93%  Weight:      Height:        Intake/Output Summary (Last 24 hours) at 05/18/2019  4656 Last data filed at 05/18/2019 0600 Gross per 24 hour  Intake 2135.85 ml  Output 820 ml  Net 1315.85 ml   Filed Weights   05/17/19 1839  Weight: 90.3 kg   Examination: Physical Exam:  Constitutional: WN/WD obese Caucasian female in NAD and appears calm Eyes: Lids and conjunctivae normal, sclerae anicteric  ENMT: External Ears, Nose appear normal. Hard of hearing  Neck: Appears normal, supple, no cervical masses, normal ROM, no appreciable thyromegaly; no JVD Respiratory: Mildly diminished to auscultation bilaterally, no wheezing, rales, rhonchi or crackles. Normal respiratory effort and patient is not tachypenic. No accessory muscle use.  Cardiovascular: RRR, no murmurs / rubs / gallops. S1 and S2 auscultated. No appreciable extremity edema. 2+ pedal pulses. No carotid bruits.  Abdomen: Soft, Mildly tender, Distended due to body habitus. No masses palpated. No appreciable  hepatosplenomegaly. Bowel sounds present. Abdominal incisions appear C/D/I and has a LLQ colostomy with ostomy pouch with some dark liquid stool GU: Deferred. Musculoskeletal: No clubbing / cyanosis of digits/nails. No joint deformity upper and lower extremities.  Skin: No rashes, lesions, ulcers on a limited skin evaluation. No induration; Warm and dry.  Neurologic: CN 2-12 grossly intact with no focal deficits. Romberg sign and cerebellar reflexes not assessed.  Psychiatric: Normal judgment and insight. Alert and oriented x 3. Normal mood and appropriate affect.   Data Reviewed: I have personally reviewed following labs and imaging studies  CBC: Recent Labs  Lab 05/11/19 1054 05/17/19 1929 05/18/19 0014  WBC 11.2* 17.6* 15.3*  NEUTROABS 5.3 14.1* 10.5*  HGB 13.6 12.2 11.6*  HCT 41.7 39.3 37.7  MCV 95.0 95.2 95.4  PLT 275 236 812   Basic Metabolic Panel: Recent Labs  Lab 05/11/19 1054 05/17/19 1929 05/18/19 0014  NA 139 137 136  K 4.7 4.1 4.7  CL 105 105 105  CO2 28 25 25   GLUCOSE 81 192* 140*  BUN 21 19 17   CREATININE 1.00 1.04* 1.10*  CALCIUM 9.7 8.8* 8.5*  MG  --  2.1 2.0  PHOS  --  3.7 3.9   GFR: Estimated Creatinine Clearance: 39.2 mL/min (A) (by C-G formula based on SCr of 1.1 mg/dL (H)). Liver Function Tests: Recent Labs  Lab 05/11/19 1054 05/17/19 1929 05/18/19 0014  AST 17 19 18   ALT 12 11 12   ALKPHOS 44 39 37*  BILITOT 0.5 0.9 0.8  PROT 7.2 6.6 5.9*  ALBUMIN 4.0 3.7 3.2*   No results for input(s): LIPASE, AMYLASE in the last 168 hours. No results for input(s): AMMONIA in the last 168 hours. Coagulation Profile: Recent Labs  Lab 05/11/19 1054  INR 1.0   Cardiac Enzymes: Recent Labs  Lab 05/17/19 1929 05/18/19 0014  TROPONINI <0.03 <0.03   BNP (last 3 results) No results for input(s): PROBNP in the last 8760 hours. HbA1C: No results for input(s): HGBA1C in the last 72 hours. CBG: Recent Labs  Lab 05/11/19 1017 05/17/19 0927  05/17/19 1648 05/17/19 2205  GLUCAP 93 120* 200* 138*   Lipid Profile: No results for input(s): CHOL, HDL, LDLCALC, TRIG, CHOLHDL, LDLDIRECT in the last 72 hours. Thyroid Function Tests: No results for input(s): TSH, T4TOTAL, FREET4, T3FREE, THYROIDAB in the last 72 hours. Anemia Panel: No results for input(s): VITAMINB12, FOLATE, FERRITIN, TIBC, IRON, RETICCTPCT in the last 72 hours. Sepsis Labs: No results for input(s): PROCALCITON, LATICACIDVEN in the last 168 hours.  Recent Results (from the past 240 hour(s))  Novel Coronavirus, NAA (hospital order; send-out to ref lab)  Status: None   Collection Time: 05/11/19 11:43 AM  Result Value Ref Range Status   SARS-CoV-2, NAA NOT DETECTED NOT DETECTED Final    Comment: (NOTE) This test was developed and its performance characteristics determined by [employer]. This test has not been FDA cleared or approved. This test has been authorized by FDA under an Emergency Use Authorization (EUA). This test is only authorized for the duration of time the declaration that circumstances exist justifying the authorization of the emergency use of in vitro diagnostic tests for detection of SARS-CoV-2 virus and/or diagnosis of COVID-19 infection under section 564(b)(1) of the Act, 21 U.S.C. 706CBJ-6(E)(8), unless the authorization is terminated or revoked sooner. When diagnostic testing is negative, the possibility of a false negative result should be considered in the context of a patient's recent exposures and the presence of clinical signs and symptoms consistent with COVID-19. An individual without symptoms of COVID-19 and who is not shedding SARS-CoV-2 virus would expect to have a negative (not detected) result in this assay. Performed  At: [facility] [address] [place], [place] [id number] Rush Farmer MD VP:[id number]    [place]  Final    Comment: Performed at [facility],  [place] [address], [place], [place] [address]  MRSA PCR Screening     Status: None   Collection Time: 05/17/19  6:50 PM  Result Value Ref Range Status   MRSA by PCR NEGATIVE NEGATIVE Final    Comment:        The GeneXpert MRSA Assay (FDA approved for NASAL specimens only), is one component of a comprehensive MRSA colonization surveillance program. It is not intended to diagnose MRSA infection nor to guide or monitor treatment for MRSA infections. Performed at [facility], [place] [address], [place], [place] [address]     Radiology Studies: No results found.  Scheduled Meds: . acetaminophen  650 mg Oral Q6H  . alvimopan  12 mg Oral BID  . aspirin  81 mg Oral Daily  . Chlorhexidine Gluconate Cloth  6 each Topical Daily  . feeding supplement  237 mL Oral BID BM  . heparin injection (subcutaneous)  5,000 Units Subcutaneous Q8H  . insulin aspart  0-5 Units Subcutaneous QHS  . insulin aspart  0-9 Units Subcutaneous TID WC  . lisinopril  20 mg Oral Daily  . mouth rinse  15 mL Mouth Rinse BID  . metoprolol succinate  100 mg Oral Daily  . pravastatin  20 mg Oral q1800   Continuous Infusions: . lactated ringers 75 mL/hr at 05/18/19 0600    LOS: 1 day   Kerney Elbe, DO [facility] PAGER is on [place]  If 7PM-7AM, please contact night-coverage [email or web address] Password [facility] 05/18/2019, 7:34 AM

## 2019-05-18 NOTE — Progress Notes (Addendum)
Subjective No acute events. Feeling quite well. Denies complaints. Pain well controlled. Denies n/v.  Objective: Vital signs in last 24 hours: Temp:  [97.4 F (36.3 C)-98.1 F (36.7 C)] 97.6 F (36.4 C) (05/21 0400) Pulse Rate:  [53-73] 53 (05/21 0600) Resp:  [9-24] 17 (05/21 0600) BP: (92-174)/(39-85) 110/74 (05/21 0600) SpO2:  [91 %-100 %] 93 % (05/21 0700) Weight:  [90.3 kg] 90.3 kg (05/20 1839)    Intake/Output from previous day: 05/20 0701 - 05/21 0700 In: 2135.9 [I.V.:2135.9] Out: 34 [Urine:770; Blood:50] Intake/Output this shift: No intake/output data recorded.  Gen: NAD, comfortable CV: RRR Pulm: Normal work of breathing Abd: Soft, NT/ND; incisions c/d/i with dermabond in place. Colostomy pink; sweat/mucous in bag. Ext: SCDs in place  Lab Results: CBC  Recent Labs    05/17/19 1929 05/18/19 0014  WBC 17.6* 15.3*  HGB 12.2 11.6*  HCT 39.3 37.7  PLT 236 224   BMET Recent Labs    05/17/19 1929 05/18/19 0014  NA 137 136  K 4.1 4.7  CL 105 105  CO2 25 25  GLUCOSE 192* 140*  BUN 19 17  CREATININE 1.04* 1.10*  CALCIUM 8.8* 8.5*   PT/INR No results for input(s): LABPROT, INR in the last 72 hours. ABG No results for input(s): PHART, HCO3 in the last 72 hours.  Invalid input(s): PCO2, PO2  Studies/Results:  Anti-infectives: Anti-infectives (From admission, onward)   Start     Dose/Rate Route Frequency Ordered Stop   05/17/19 1400  neomycin (MYCIFRADIN) tablet 1,000 mg  Status:  Discontinued     1,000 mg Oral 3 times per day 05/17/19 0842 05/17/19 0849   05/17/19 1400  metroNIDAZOLE (FLAGYL) tablet 1,000 mg  Status:  Discontinued     1,000 mg Oral 3 times per day 05/17/19 0842 05/17/19 0849   05/17/19 0900  cefoTEtan (CEFOTAN) 2 g in sodium chloride 0.9 % 100 mL IVPB     2 g 200 mL/hr over 30 Minutes Intravenous On call to O.R. 05/17/19 0845 05/17/19 1303   05/17/19 0845  cefoTEtan (CEFOTAN) 2 g in sodium chloride 0.9 % 100 mL IVPB  Status:   Discontinued     2 g 200 mL/hr over 30 Minutes Intravenous On call to O.R. 05/17/19 8546 05/17/19 0843   05/17/19 0845  cefoTEtan (CEFOTAN) 2 g in sodium chloride 0.9 % 100 mL IVPB  Status:  Discontinued     2 g 200 mL/hr over 30 Minutes Intravenous On call to O.R. 05/17/19 0842 05/17/19 0843       Assessment/Plan: Patient Active Problem List   Diagnosis Date Noted  . Rectal mass 05/17/2019  . Insomnia 09/30/2018  . Abnormal gait 09/30/2018  . Bilateral impacted cerumen 09/30/2018  . Chronic diarrhea 09/30/2018  . Type 2 diabetes mellitus with stage 3 chronic kidney disease, without long-term current use of insulin ([place]) 09/30/2018  . Hyperlipidemia 09/30/2018  . Nosebleed 09/30/2018  . Does use hearing aid 09/30/2018  . Positive colorectal cancer screening using Cologuard test 08/12/2018  . HTN (hypertension) 08/12/2018   XI ROBOTIC ASSISTED LOWER ANTERIOR RESECTION WITH END COLOSTOMY FLEXIBLE SIGMOIDOSCOPY 05/17/2019  POD#1  -Recovering well; no issues with Afib/rvr overnight -UOP adequate last 12hrs -Advance to clear liquids; if tolerates, will advance to full liquids -WOCN to see -PT/OT -Continue foley today due to pelvic dissection -Transfer to telemetry -Medicine following for assistance in management of comorbidities - appreciate their assistance in her care   LOS: 1 day   Sharon [place]. Dema Severin, M.D.  [facility]

## 2019-05-18 NOTE — Evaluation (Signed)
Occupational Therapy Evaluation Patient Details Name: Heidi Whitaker MRN: [id number] DOB: 1929-02-19 Today's Date: 05/18/2019    History of Present Illness s/p colostomy due to rectal mass.  PMH:  HTN and DM   Clinical Impression   This 85 year old female was admitted for the above sx. She recently moved here from [place] to live with daughter as her husband died.  She is usually independent/mod I for adls.  She uses a toilet riser at baseline. Pt was limited by pain. She could not tolerating rolling to get OOB and needed mod A for bed mobility and min +2 safety for transfer. She needs up to total A for LB dressing due to pain. Will follow in acute setting with supervision to min A level goals. Daughter can assist as needed    Follow Up Recommendations  Home health OT;Supervision/Assistance - 24 hour    Equipment Recommendations  (tba has toilet riser)    Recommendations for Other Services       Precautions / Restrictions Precautions Precautions: Fall Restrictions Weight Bearing Restrictions: No      Mobility Bed Mobility Overal bed mobility: Needs Assistance             General bed mobility comments: attempted rolling, but pt had too much pain.  Brought HOB up and mod A for sitting EOB  Transfers Overall transfer level: Needs assistance Equipment used: Rolling walker (2 wheeled) Transfers: Sit to/from Stand Sit to Stand: Min assist;+2 safety/equipment         General transfer comment: assist to rise and steady from raised bed.  Took a few steps to step to chair    Balance                                           ADL either performed or assessed with clinical judgement   ADL Overall ADL's : Needs assistance/impaired Eating/Feeding: Independent   Grooming: Set up   Upper Body Bathing: Minimal assistance   Lower Body Bathing: Maximal assistance   Upper Body Dressing : Minimal assistance   Lower Body Dressing: Total assistance    Toilet Transfer: Minimal assistance;+2 for safety/equipment;Stand-pivot;RW(to chair)   Toileting- Clothing Manipulation and Hygiene: Maximal assistance         General ADL Comments: adls limited by pain. She usually crosses legs for adls     Vision         Perception     Praxis      Pertinent Vitals/Pain Pain Assessment: Faces Faces Pain Scale: Hurts whole lot Pain Location: abdomen with movement Pain Descriptors / Indicators: Aching Pain Intervention(s): Limited activity within patient's tolerance;Monitored during session;Repositioned     Hand Dominance     Extremity/Trunk Assessment Upper Extremity Assessment Upper Extremity Assessment: Generalized weakness           Communication Communication Communication: HOH   Cognition Arousal/Alertness: Awake/alert Behavior During Therapy: WFL for tasks assessed/performed Overall Cognitive Status: Within Functional Limits for tasks assessed                                     General Comments       Exercises     Shoulder Instructions      Home Living Family/patient expects to be discharged to:: Private residence Living Arrangements: Children Available Help  at Discharge: Family Type of Home: House Home Access: Stairs to enter [employer] of Steps: 4-5 can reach both handrails   Home Layout: Two level Alternate Level Stairs-Number of Steps: 16   Bathroom Shower/Tub: Walk-in shower         Home Equipment: Environmental consultant - 2 wheels;Cane - single point;Toilet riser;Grab bars - tub/shower   Additional Comments: daughter home during the day.  Son in Sports coach and grandson also there      Prior Functioning/Environment Level of Independence: Independent                 OT Problem List: Decreased strength;Decreased activity tolerance;Impaired balance (sitting and/or standing);Decreased knowledge of use of DME or AE;Decreased knowledge of precautions;Pain      OT  Treatment/Interventions: Self-care/ADL training;Energy conservation;DME and/or AE instruction;Therapeutic activities;Patient/family education;Balance training    OT Goals(Current goals can be found in the care plan section) Acute Rehab OT Goals Patient Stated Goal: less pain; return to PLOF OT Goal Formulation: With patient Time For Goal Achievement: 06/01/19 Potential to Achieve Goals: Good ADL Goals Pt Will Perform Grooming: with supervision;standing Pt Will Transfer to Toilet: with supervision;ambulating(toilet with riser) Pt Will Perform Toileting - Clothing Manipulation and hygiene: with supervision;sit to/from stand Additional ADL Goal #1: pt will perform adl with set up/min A, sit to stand Additional ADL Goal #2: pt will get up from flat bed with min A  OT Frequency: Min 2X/week   Barriers to D/C:            Co-evaluation PT/OT/SLP Co-Evaluation/Treatment: Yes Reason for Co-Treatment: For patient/therapist safety PT goals addressed during session: Mobility/safety with mobility OT goals addressed during session: ADL's and self-care      AM-PAC OT "6 Clicks" Daily Activity     Outcome Measure Help from another person eating meals?: None Help from another person taking care of personal grooming?: A Little Help from another person toileting, which includes using toliet, bedpan, or urinal?: A Lot Help from another person bathing (including washing, rinsing, drying)?: A Lot Help from another person to put on and taking off regular upper body clothing?: A Little Help from another person to put on and taking off regular lower body clothing?: Total 6 Click Score: 15   End of Session Nurse Communication: Mobility status  Activity Tolerance: Patient limited by pain Patient left: in chair;with call bell/phone within reach;with chair alarm set  OT Visit Diagnosis: Muscle weakness (generalized) (M62.81)                Time: 1610-9604 OT Time Calculation (min): 24 min Charges:   OT General Charges $OT Visit: 1 Visit OT Evaluation $OT Eval Low Complexity: [place], OTR/L Acute Rehabilitation Services [phone] [facility] pager [phone] office 05/18/2019  [place] 05/18/2019, 1:26 PM

## 2019-05-19 LAB — GLUCOSE, CAPILLARY
Glucose-Capillary: 105 mg/dL — ABNORMAL HIGH (ref 70–99)
Glucose-Capillary: 127 mg/dL — ABNORMAL HIGH (ref 70–99)
Glucose-Capillary: 137 mg/dL — ABNORMAL HIGH (ref 70–99)
Glucose-Capillary: 118 mg/dL — ABNORMAL HIGH (ref 70–99)

## 2019-05-19 LAB — CBC WITH DIFFERENTIAL/PLATELET
Abs Immature Granulocytes: 0.05 10*3/uL (ref 0.00–0.07)
Basophils Absolute: 0 10*3/uL (ref 0.0–0.1)
Basophils Relative: 0 %
Eosinophils Absolute: 0.5 10*3/uL (ref 0.0–0.5)
Eosinophils Relative: 5 %
HCT: 34.6 % — ABNORMAL LOW (ref 36.0–46.0)
Hemoglobin: 11.2 g/dL — ABNORMAL LOW (ref 12.0–15.0)
Immature Granulocytes: 1 %
Lymphocytes Relative: 30 %
Lymphs Abs: 3.3 10*3/uL (ref 0.7–4.0)
MCH: 30.8 pg (ref 26.0–34.0)
MCHC: 32.4 g/dL (ref 30.0–36.0)
MCV: 95.1 fL (ref 80.0–100.0)
Monocytes Absolute: 1.4 10*3/uL — ABNORMAL HIGH (ref 0.1–1.0)
Monocytes Relative: 13 %
Neutro Abs: 5.8 10*3/uL (ref 1.7–7.7)
Neutrophils Relative %: 51 %
Platelets: 195 10*3/uL (ref 150–400)
RBC: 3.64 MIL/uL — ABNORMAL LOW (ref 3.87–5.11)
RDW: 13.3 % (ref 11.5–15.5)
WBC: 11.1 10*3/uL — ABNORMAL HIGH (ref 4.0–10.5)
nRBC: 0 % (ref 0.0–0.2)

## 2019-05-19 LAB — COMPREHENSIVE METABOLIC PANEL
ALT: 11 U/L (ref 0–44)
AST: 20 U/L (ref 15–41)
Albumin: 3.2 g/dL — ABNORMAL LOW (ref 3.5–5.0)
Alkaline Phosphatase: 34 U/L — ABNORMAL LOW (ref 38–126)
Anion gap: 6 (ref 5–15)
BUN: 12 mg/dL (ref 8–23)
CO2: 24 mmol/L (ref 22–32)
Calcium: 8.6 mg/dL — ABNORMAL LOW (ref 8.9–10.3)
Chloride: 108 mmol/L (ref 98–111)
Creatinine, Ser: 0.95 mg/dL (ref 0.44–1.00)
GFR calc Af Amer: >60 mL/min (ref >60)
GFR calc non Af Amer: 54 mL/min — ABNORMAL LOW (ref >60)
Glucose, Bld: 105 mg/dL — ABNORMAL HIGH (ref 70–99)
Potassium: 4.1 mmol/L (ref 3.5–5.1)
Sodium: 138 mmol/L (ref 135–145)
Total Bilirubin: 0.8 mg/dL (ref 0.3–1.2)
Total Protein: 6.1 g/dL — ABNORMAL LOW (ref 6.5–8.1)

## 2019-05-19 LAB — RETICULOCYTES
Immature Retic Fract: 7.9 % (ref 2.3–15.9)
RBC.: 3.64 MIL/uL — ABNORMAL LOW (ref 3.87–5.11)
Retic Count, Absolute: 39.3 10*3/uL (ref 19.0–186.0)
Retic Ct Pct: 1.1 % (ref 0.4–3.1)

## 2019-05-19 LAB — IRON AND TIBC
Iron: 33 ug/dL (ref 28–170)
Saturation Ratios: 14 % (ref 10.4–31.8)
TIBC: 240 ug/dL — ABNORMAL LOW (ref 250–450)
UIBC: 207 ug/dL

## 2019-05-19 LAB — FERRITIN: Ferritin: 101 ng/mL (ref 11–307)

## 2019-05-19 LAB — PHOSPHORUS: Phosphorus: 2.1 mg/dL — ABNORMAL LOW (ref 2.5–4.6)

## 2019-05-19 LAB — FOLATE: Folate: 19.2 ng/mL (ref >5.9)

## 2019-05-19 LAB — VITAMIN B12: Vitamin B-12: 302 pg/mL (ref 180–914)

## 2019-05-19 LAB — MAGNESIUM: Magnesium: 2.1 mg/dL (ref 1.7–2.4)

## 2019-05-19 MED ORDER — LISINOPRIL 20 MG PO TABS
20.0000 mg | ORAL_TABLET | Freq: Every day | ORAL | Status: DC
  Administered 2019-05-19 – 2019-05-23 (×5): 20 mg via ORAL
  Filled 2019-05-19 (×5): qty 1

## 2019-05-19 MED ORDER — K PHOS MONO-SOD PHOS DI & MONO 155-852-130 MG PO TABS
500.0000 mg | ORAL_TABLET | Freq: Two times a day (BID) | ORAL | Status: AC
  Administered 2019-05-19 (×2): 500 mg via ORAL
  Filled 2019-05-19 (×2): qty 2

## 2019-05-19 MED ORDER — TRAMADOL HCL 50 MG PO TABS: 50.0000 mg | ORAL_TABLET | Freq: Four times a day (QID) | ORAL | Status: DC | PRN

## 2019-05-19 MED ORDER — TRAMADOL HCL 50 MG PO TABS: 50.0000 mg | ORAL_TABLET | Freq: Four times a day (QID) | ORAL | 0 refills | Status: AC | PRN

## 2019-05-19 NOTE — Progress Notes (Signed)
Physical Therapy Treatment Patient Details Name: Heidi Whitaker MRN: [id number] DOB: 29-Jul-1929 Today's Date: 05/19/2019    History of Present Illness s/p colostomy due to rectal mass.  PMH:  HTN and DM    PT Comments    POD # 2 Assisted OOB to amb.  General bed mobility comments: required assist due to recent ABD surgery. General transfer comment: assist to rise and steady from raised bed.  Safety with turns and hand placement prior to sit.  Increased pain due to ABD.  General Gait Details: tolerated an increased distance.  Used 4 ww for seated rest break.  Increased ABD pain with sit to stand and stand to sit.   FACETIMED daughter during session to show pt amb and give update.  Pt lives with daughter.  Pt and daughter worried about pt getting up to her second floor room.  18 steps but does have a landing. Idea to place a chair if needed 1/2 way.     Follow Up Recommendations  Home health PT;Supervision/Assistance - 24 hour     Equipment Recommendations  None recommended by PT(has a walker )    Recommendations for Other Services       Precautions / Restrictions Precautions Precautions: Fall Precaution Comments: recent ABD surgery/new Colostomy  Restrictions Weight Bearing Restrictions: No    Mobility  Bed Mobility Overal bed mobility: Needs Assistance Bed Mobility: Supine to Sit     Supine to sit: Min assist;Mod assist     General bed mobility comments: required assist due to recent ABD surgery.   Transfers Overall transfer level: Needs assistance Equipment used: 4-wheeled walker Transfers: Sit to/from [facility] Sit to Stand: Min guard;Min assist Stand pivot transfers: Min guard;Min assist       General transfer comment: assist to rise and steady from raised bed.  Safety with turns and hand placement prior to sit.  Increased pain due to ABD.    Ambulation/Gait Ambulation/Gait assistance: Min guard;Min assist Gait Distance (Feet): 150  Feet(75 feet x 2 one seated rest break) Assistive device: 4-wheeled walker Gait Pattern/deviations: Step-to pattern;Step-through pattern;Trunk flexed Gait velocity: decr   General Gait Details: tolerated an increased distance.  Used 4 ww for seated rest break.  Increased ABD pain with sit to stand and stand to sit.     Stairs             Wheelchair Mobility    Modified Rankin (Stroke Patients Only)       Balance                                            Cognition Arousal/Alertness: Awake/alert Behavior During Therapy: WFL for tasks assessed/performed Overall Cognitive Status: Within Functional Limits for tasks assessed                                 General Comments: pleasant and sharpe      Exercises      General Comments        Pertinent Vitals/Pain Pain Assessment: Faces Faces Pain Scale: Hurts even more Pain Location: abdomen with movement Pain Descriptors / Indicators: Aching;Tender Pain Intervention(s): Monitored during session    Home Living                      Prior  Function            PT Goals (current goals can now be found in the care plan section) Progress towards PT goals: Progressing toward goals    Frequency    Min 3X/week      PT Plan Current plan remains appropriate    Co-evaluation              AM-PAC PT "6 Clicks" Mobility   Outcome Measure  Help needed turning from your back to your side while in a flat bed without using bedrails?: A Lot Help needed moving from lying on your back to sitting on the side of a flat bed without using bedrails?: A Lot Help needed moving to and from a bed to a chair (including a wheelchair)?: A Little Help needed standing up from a chair using your arms (e.g., wheelchair or bedside chair)?: A Little Help needed to walk in hospital room?: A Little Help needed climbing 3-5 steps with a railing? : A Little 6 Click Score: 16    End of Session  Equipment Utilized During Treatment: Gait belt Activity Tolerance: Patient tolerated treatment well Patient left: in chair;with call bell/phone within reach;with chair alarm set Nurse Communication: Mobility status PT Visit Diagnosis: Unsteadiness on feet (R26.81);Muscle weakness (generalized) (M62.81);Pain;Difficulty in walking, not elsewhere classified (R26.2)     Time: 2197-5883 PT Time Calculation (min) (ACUTE ONLY): 45 min  Charges:  $Gait Training: 23-37 mins $Therapeutic Activity: 8-22 mins                     Rica Koyanagi  PTA Acute  Rehabilitation Services Pager      [phone] Office      [phone]

## 2019-05-19 NOTE — Care Management (Signed)
Patient will stay with dtr @ d/c. Heidi Whitaker [address] [place] [phone].

## 2019-05-19 NOTE — TOC Initial Note (Signed)
Transition of Care [facility]) - Initial/Assessment Note    Patient Details  Name: Heidi Whitaker MRN: [id number] Date of Birth: [birth year]  [facility] CM/SW Contact:    Dessa Phi, RN Phone Number: 05/19/2019, 11:25 AM  Clinical Narrative:  CM referral for HHC-spoke to patient/dtr(Deborah) in rm about d/c plans-informed of recc-home w/HHC-HHRN-ostomy,PT/OT-therapy. Patient/dtr chose [facility] -rep Cheryl aware, & following for d/c-already has [place] orders, & face to face. WOc-has already provided teaching,& ordered ostomy supplies. Patient/dtr had concerns about transport home-informed of PTAR non emergency transp(the insurance may or may not cover the service-they will bill) dtr prefers to have therapy work with patient since they have steps 15 in total to negotiate so patient can be where she needs to be once @ home.[name] along with family will plan to transport home in vehicle to avoid a possible bill from insurance if PTAR is not covered. Patient will have her son in law to contact insurance company to find out coverage for transport.[name] has a put a note for PT to please work with patient to be able to negotiate steps. Informed patient/dtr that if they decided @ d/c to transport by PTAR that could be arranged.               Expected Discharge Plan: [place] Barriers to Discharge: Continued Medical Work up   Patient Goals and CMS Choice Patient states their goals for this hospitalization and ongoing recovery are:: go home CMS Medicare.gov Compare Post Acute Care list provided to:: Patient Choice offered to / list presented to : Patient  Expected Discharge Plan and Services Expected Discharge Plan: [place]   Discharge Planning Services: CM Consult   Living arrangements for the past 2 months: Single Family Home                           HH Arranged: RN, PT, OT [facility] Agency: [place] Date Nittany: 05/19/19 Time HH Agency Contacted: [phone] Representative spoke with at [place]: Sharmon Revere  Prior Living Arrangements/Services Living arrangements for the past 2 months: [place] with:: Adult Children Patient language and need for interpreter reviewed:: Yes Do you feel safe going back to the place where you live?: Yes      Need for Family Participation in Patient Care: No (Comment) Care giver support system in place?: Yes (comment) Current home services: DME(rw,toilet riser) Criminal Activity/Legal Involvement Pertinent to Current Situation/Hospitalization: No - Comment as needed  Activities of Daily Living Home Assistive Devices/Equipment: Cane (specify quad or straight), Hearing aid, Walker (specify type) ADL Screening (condition at time of admission) Patient's cognitive ability adequate to safely complete daily activities?: Yes Is the patient deaf or have difficulty hearing?: Yes(hard of hearing) Does the patient have difficulty seeing, even when wearing glasses/contacts?: No Does the patient have difficulty concentrating, remembering, or making decisions?: No Patient able to express need for assistance with ADLs?: Yes Does the patient have difficulty dressing or bathing?: No Independently performs ADLs?: Yes (appropriate for developmental age) Does the patient have difficulty walking or climbing stairs?: No Weakness of Legs: None Weakness of Arms/Hands: None  Permission Sought/Granted Permission sought to share information with : Case Manager Permission granted to share information with : Yes, Verbal Permission Granted  Share Information with NAME: Reather Laurence  Permission granted to share info w AGENCY: [facility]  Permission granted to share info w  Relationship: dtr  Permission granted to share info w Contact Information: [phone]  Emotional Assessment Appearance:: Well-Groomed Attitude/Demeanor/Rapport: Gracious Affect (typically  observed): Accepting Orientation: : Oriented to Self, Oriented to Place, Oriented to  Time, Oriented to Situation(HOH)   Psych Involvement: No (comment)  Admission diagnosis:  CECAL POLYP, RECTOSIGMOID MASS Patient Active Problem List   Diagnosis Date Noted  . Rectal mass 05/17/2019  . Insomnia 09/30/2018  . Abnormal gait 09/30/2018  . Bilateral impacted cerumen 09/30/2018  . Chronic diarrhea 09/30/2018  . Type 2 diabetes mellitus with stage 3 chronic kidney disease, without long-term current use of insulin ([place]) 09/30/2018  . Hyperlipidemia 09/30/2018  . Nosebleed 09/30/2018  . Does use hearing aid 09/30/2018  . Positive colorectal cancer screening using Cologuard test 08/12/2018  . HTN (hypertension) 08/12/2018   PCP:  McLean-Scocuzza, Nino Glow, MD Pharmacy:   [place], [address] [place] [address] Phone: [phone] Fax: [phone]  [id number] - [place], [place] AT [place] [address] [place] [address] Phone: [phone] Fax: [phone]     Social Determinants of Health ([place]) Interventions    Readmission Risk Interventions No flowsheet data found.

## 2019-05-19 NOTE — Progress Notes (Signed)
Subjective No acute events. Feeling well. Denies complaints. Pain well controlled. Denies n/v - tolerating full liquids without n/v. Working with therapies  Objective: Vital signs in last 24 hours: Temp:  [98.1 F (36.7 C)-98.8 F (37.1 C)] 98.6 F (37 C) (05/22 0542) Pulse Rate:  [62-68] 66 (05/22 0542) Resp:  [15-18] 18 (05/22 0542) BP: (124-144)/(62-70) 144/62 (05/22 0542) SpO2:  [87 %-93 %] 87 % (05/22 0542) Weight:  [88.9 kg] 88.9 kg (05/22 0542) Last BM Date: 05/18/19(small stool in colostomy)  Intake/Output from previous day: 05/21 0701 - 05/22 0700 In: 676.2 [P.O.:120; I.V.:556.2] Out: 2050 [Urine:1900; Stool:150] Intake/Output this shift: No intake/output data recorded.  Gen: NAD, comfortable CV: RRR Pulm: Normal work of breathing Abd: Soft, NT/ND; incisions c/d/i with dermabond in place. Colostomy pink; gas and stool in appliance Ext: SCDs in place  Lab Results: CBC  Recent Labs    05/18/19 0014 05/19/19 0603  WBC 15.3* 11.1*  HGB 11.6* 11.2*  HCT 37.7 34.6*  PLT 224 195   BMET Recent Labs    05/18/19 0014 05/19/19 0603  NA 136 138  K 4.7 4.1  CL 105 108  CO2 25 24  GLUCOSE 140* 105*  BUN 17 12  CREATININE 1.10* 0.95  CALCIUM 8.5* 8.6*   PT/INR No results for input(s): LABPROT, INR in the last 72 hours. ABG No results for input(s): PHART, HCO3 in the last 72 hours.  Invalid input(s): PCO2, PO2  Studies/Results:  Anti-infectives: Anti-infectives (From admission, onward)   Start     Dose/Rate Route Frequency Ordered Stop   05/17/19 1400  neomycin (MYCIFRADIN) tablet 1,000 mg  Status:  Discontinued     1,000 mg Oral 3 times per day 05/17/19 0842 05/17/19 0849   05/17/19 1400  metroNIDAZOLE (FLAGYL) tablet 1,000 mg  Status:  Discontinued     1,000 mg Oral 3 times per day 05/17/19 0842 05/17/19 0849   05/17/19 0900  cefoTEtan (CEFOTAN) 2 g in sodium chloride 0.9 % 100 mL IVPB     2 g 200 mL/hr over 30 Minutes Intravenous On call to O.R.  05/17/19 0845 05/17/19 1303   05/17/19 0845  cefoTEtan (CEFOTAN) 2 g in sodium chloride 0.9 % 100 mL IVPB  Status:  Discontinued     2 g 200 mL/hr over 30 Minutes Intravenous On call to O.R. 05/17/19 9417 05/17/19 0843   05/17/19 0845  cefoTEtan (CEFOTAN) 2 g in sodium chloride 0.9 % 100 mL IVPB  Status:  Discontinued     2 g 200 mL/hr over 30 Minutes Intravenous On call to O.R. 05/17/19 0842 05/17/19 0843       Assessment/Plan: Patient Active Problem List   Diagnosis Date Noted  . Rectal mass 05/17/2019  . Insomnia 09/30/2018  . Abnormal gait 09/30/2018  . Bilateral impacted cerumen 09/30/2018  . Chronic diarrhea 09/30/2018  . Type 2 diabetes mellitus with stage 3 chronic kidney disease, without long-term current use of insulin ([place]) 09/30/2018  . Hyperlipidemia 09/30/2018  . Nosebleed 09/30/2018  . Does use hearing aid 09/30/2018  . Positive colorectal cancer screening using Cologuard test 08/12/2018  . HTN (hypertension) 08/12/2018   XI ROBOTIC ASSISTED LOWER ANTERIOR RESECTION WITH END COLOSTOMY FLEXIBLE SIGMOIDOSCOPY 05/17/2019  POD#2  -Recovering well; no issues with Afib/rvr since OR -Advance to soft diet -WOCN following - apprecaite assistance -PT/OT - recommending home health PT/OT -D/C foley -Medicine following for assistance in management of comorbidities - appreciate their assistance in her care -Home PT/OT and a skilled nurse  requested (new colostomy). Will need family to attend colostomy changing/teaching prior to discharge   LOS: 2 days   Sharon Mt. Dema Severin, M.D. [facility]

## 2019-05-19 NOTE — Consult Note (Addendum)
[place] Nurse ostomy follow-up consult note Stoma type/location: Colostomy surgery performed 5/20.  Stomal assessment/size: Stoma is red and viable, 1 1/2 inches and slightly oval, flush with skin level, located with a crease at 3:00 o'clock and 9:00 o'clock Peristomal assessment: Intact skin surrounding Output: Small amt brown unformed stool  Ostomy pouching: 1pc.  Education provided:  Current pouch is leaking behind the barrier.  Added barrier ring to attempt to maintain a seal.  Daughter at the bedside foa a teaching session.  Demonstrated pouch application using one piece convex pouch and a barrier ring.  Pt was able to open and close velcro to empty.  Educational materials left at the bedside. 5 sets of barrier rings and convex pouches ordered to the room for staff nurse use.  Reviewed pouching routines and ordering supplies.   A WOC team member will see again on Monday for further teaching if patient is still in the hospital at that time.  Recommend home health assistance for further teaching after discharge, please order if desired.  Enrolled patient in [place] program: Yes Julien Girt MSN, RN, [place], [place], [place]

## 2019-05-19 NOTE — Progress Notes (Signed)
PROGRESS NOTE    Heidi Whitaker  JJH:[id number] DOB: 1929/10/02 DOA: 05/17/2019 PCP: McLean-Scocuzza, Nino Glow, MD  Brief Narrative:  Heidi Whitaker is an 85 y.o. female with a past medical history significant for but not limited to hypertension, hyperlipidemia, diabetes mellitus type 2 on oral hypoglycemics, along with obesity and chronic diarrhea who presented for an elective surgical procedure today.  Patient has been having chronic diarrhea for the past couple years and had a history of a positive Cologuard test in 2017 and underwent a colonoscopy earlier in January which was notable for an endoscopic clearly unresectable polyp in the cecum and endoscopic unresectable polyp in the rectum which was frond-like/villous and polypoid.  She underwent CT scans of the abdomen pelvis did not visualize lesion seen her colon and rectum.  She denied any abdominal pain, nausea or vomiting besides having chronic diarrhea and stated that her stools been mucousy in the past couple years.  She underwent cardiac clearance and underwent MRI pelvis for rectal polypoid mass and this demonstrated findings of probable invasion of the muscularis propria and was read as a rectosigmoid T3N 0/1 mass.  She presented to the general surgeon's office and the consensus was to proceed with a low anterior resection and not treated with neoadjuvant therapy.  She underwent a robotic assisted low anterior resection with a end colostomy on and was seen in the PACU at the request of Dr. Dema Severin for medical management for comorbid conditions.  After she had surgical intervention she underwent transient atrial fibrillation with RVR which was abated by a dose of IV metoprolol.  She went to the stepdown unit in stable condition.  Currently POD2 and was transferred to Medical Floor.  Diet is being advanced and she is improving.  PT OT evaluating and recommending home health.  Foley catheter is to be discontinued today and patient will have  a colostomy teaching by the [facility] Nurse  Assessment & Plan:   Active Problems:   Rectal mass  Rectal Mass s/p Robotic LOA with End Colostomy -POD2 -Further Care per General Surgery including Pain Control with Oxyxodone and Fentanyl  -Diet Advancement per General Surgery and advancing to Soft Today -WOC nurse consulted   -DVT Prophylaxis per Surgery  -PT/OT recommending Home Health  Hypertension -Blood Pressure slightly elevated today at 144/62 -Antihypertensives had been held but are planning to be resumed -Was Holding Lisinopril again today due to slight bump in Cr but now that Cr is improved will resume Lisinopril 20 mg po Daily today  -Metoprolol 100 mg po Daily to be resumed this AM per General Surgery -Continue to Monitor BP carefully  HLD -Agree with Resuming Lovastatin substitution with Pravastatin 20 mg po Daily  Diabetes Mellitus Type 2 -Currently holding Glimperide 1 tab (2 mg po Daily) -Agree with Starting on Sensitive Novolog SSI AC/HS -CBG's currently ranging from 105-167 -Continue to Monitor and Trend CBG's; This AM Blood Glucose was 140 on CMP   Transient A Fib with RVR now in NSR -Converted to NSR after given a dose of IV Metoprolol -Continue to Monitor on Telemetry -Likely related to Surgical Intervention -CHA2DS2-VASc of 5 (Age, Sex, HTN, Diabetes) so is at a high risk for CVA -Since it was related to Surgical intervention and short-lived will hold off on Starting Anticoagulation currently, especially since she just had surgery -IF patient goes back into Atrial Fibrillation will need Anticoagulation -Cycle Cardiac Troponin's; Troponin was <0.03 x2  -Will need to review ECHO done for Pre-operative Clearance -  Will need to Request Records from [facility] showed LVEF of 66%; Read as intermediate to Low risk Scan as there was borderline normal myocardial perfusion as there was a small area of apical defect possibly apical thinning and  no clear evidence of ischemia  -Will hold off on Anticoagulation currently and patient will need to follow up with Cardiology as an outpatient for further workup -Resumed Home Metoprolol yesterday AM  -If goes back into Atrial Fibrillation here will need Inpatient ECHO and Cardiac Consultation.  -Follow up with Cardiology in the Outpatient setting after D/C  Obesity -Estimated body mass index is 33.64 kg/m as calculated from the following:   Height as of this encounter: 5\' 4"  (1.626 m).   Weight as of this encounter: 88.9 kg. -Weight Loss and Dietary Counseling Given -Will need Nutritionist Evaluation when Diet is advanced.   CKD Stage 3 -Cr improved now; BUN/Cr went from 21/1.00 -> 17/1.10 -> 12/0.95 -BP fairly well controlled so will hold Lisinopril at this time; Avoid Hypotension -IVF with Lactated Ringers at 75 mL/hr now discontinued  -Continue to monitor and Trend Renal Fxn -Repeat CMP in AM and since Cr improved resuming Lisinopril   Normocytic Anemia -Hgb/Hct went from 13.6/41.7 -> 11.6/37.7 -> 11.2/34.6 -Likely expected Post-operative Drop -Checked Anemia Panel with iron level of 33, U IBC of 2 7, TIBC of 240, saturation was 14%, ferritin level 101, folate of 19.2, and vitamin B12 of 302, -Continue to Monitor for S/Sx of Bleeding -Repeat CBC in AM  Leukocytosis -Patient's WBC went from 11.2 -> 17.6 -> 15.3 -> 11.1 -Trending down now after Surgery -Continue to Monitor and Trend WBC -Repeat CBC in AM   Hypophosphatemia -Patient's phosphorus level this morning was 2.1 -Replete with K-Phos Neutral 500 mg p.o. twice daily x2 doses -Continue to monitor replete as necessary -Repeat Phos Level in a.m.  DVT prophylaxis: Heparin 5,000 units sq q8h Code Status: FULL CODE Family Communication: No Family present at bedside  Disposition Plan: Per Primary Team   Consultants:  [facility] is Primary    Procedures:  1. Robotic assisted low anterior resection with  end colostomy 2. Flexible sigmoidoscopy   Antimicrobials:  Anti-infectives (From admission, onward)   Start     Dose/Rate Route Frequency Ordered Stop   05/17/19 1400  neomycin (MYCIFRADIN) tablet 1,000 mg  Status:  Discontinued     1,000 mg Oral 3 times per day 05/17/19 0842 05/17/19 0849   05/17/19 1400  metroNIDAZOLE (FLAGYL) tablet 1,000 mg  Status:  Discontinued     1,000 mg Oral 3 times per day 05/17/19 0842 05/17/19 0849   05/17/19 0900  cefoTEtan (CEFOTAN) 2 g in sodium chloride 0.9 % 100 mL IVPB     2 g 200 mL/hr over 30 Minutes Intravenous On call to O.R. 05/17/19 0845 05/17/19 1303   05/17/19 0845  cefoTEtan (CEFOTAN) 2 g in sodium chloride 0.9 % 100 mL IVPB  Status:  Discontinued     2 g 200 mL/hr over 30 Minutes Intravenous On call to O.R. 05/17/19 9937 05/17/19 0843   05/17/19 0845  cefoTEtan (CEFOTAN) 2 g in sodium chloride 0.9 % 100 mL IVPB  Status:  Discontinued     2 g 200 mL/hr over 30 Minutes Intravenous On call to O.R. 05/17/19 1696 05/17/19 0843     Subjective: Seen and examined at bedside and states that she had some abdominal soreness especially when she is tried to stand up or turn.  No chest pain, lightheadedness or dizziness.  States that the colostomy is not what she wanted but it had to be done.  No other concerns or complaints at this time.  Objective: Vitals:   05/18/19 1038 05/18/19 1330 05/18/19 2116 05/19/19 0542  BP: 124/62 129/70 (!) 142/64 (!) 144/62  Pulse: 62 68 63 66  Resp: 15 16 18 18   Temp: 98.8 F (37.1 C) 98.1 F (36.7 C) 98.3 F (36.8 C) 98.6 F (37 C)  TempSrc: Oral Oral Oral Oral  SpO2: 91% 93% 92% (!) 87%  Weight:    88.9 kg  Height:        Intake/Output Summary (Last 24 hours) at 05/19/2019 1241 Last data filed at 05/19/2019 0600 Gross per 24 hour  Intake 676.18 ml  Output 2050 ml  Net -1373.82 ml   Filed Weights   05/17/19 1839 05/19/19 0542  Weight: 90.3 kg 88.9 kg   Examination: Physical Exam:  Constitutional:  Well-nourished, well-developed obese Caucasian female currently no acute distress appears calm and appears comfortable watching television. Eyes: Lids and conjunctive normal.  Sclera anicteric ENMT: External ears nose appear normal.  Patient is hard of hearing Neck: Appears supple no JVD Respiratory: Mildly diminished but no appreciable wheezing, rales, rhonchi.  Patient is not tachypneic or using any accessory muscles to breathe. Cardiovascular: Regular rate and rhythm.  No appreciable murmurs, rubs, gallops. Abdomen: Soft, mildly tender.  Distended secondary body habitus.  Bowel sounds present.  Abdominal incisions appear clean dry intact and has a left lower quadrant colostomy with ostomy pouch with having some dark liquid stool and mildly soft stool GU: Deferred Musculoskeletal: No contractures or cyanosis. Skin: Skin is warm and dry no appreciable rashes or lesions.  Abdominal incisions appear clean dry intact and she has a colostomy in place Neurologic: Cranial nerves II through XII grossly intact no appreciable focal deficits.  Romberg sign cerebellar reflexes were not assessed Psychiatric: Normal judgment and insight.  Patient is awake, alert, oriented.  Has a pleasant mood and affect  Data Reviewed: I have personally reviewed following labs and imaging studies  CBC: Recent Labs  Lab 05/17/19 1929 05/18/19 0014 05/19/19 0603  WBC 17.6* 15.3* 11.1*  NEUTROABS 14.1* 10.5* 5.8  HGB 12.2 11.6* 11.2*  HCT 39.3 37.7 34.6*  MCV 95.2 95.4 95.1  PLT 236 224 233   Basic Metabolic Panel: Recent Labs  Lab 05/17/19 1929 05/18/19 0014 05/19/19 0603  NA 137 136 138  K 4.1 4.7 4.1  CL 105 105 108  CO2 25 25 24   GLUCOSE 192* 140* 105*  BUN 19 17 12   CREATININE 1.04* 1.10* 0.95  CALCIUM 8.8* 8.5* 8.6*  MG 2.1 2.0 2.1  PHOS 3.7 3.9 2.1*   GFR: Estimated Creatinine Clearance: 45.1 mL/min (by C-G formula based on SCr of 0.95 mg/dL). Liver Function Tests: Recent Labs  Lab 05/17/19  1929 05/18/19 0014 05/19/19 0603  AST 19 18 20   ALT 11 12 11   ALKPHOS 39 37* 34*  BILITOT 0.9 0.8 0.8  PROT 6.6 5.9* 6.1*  ALBUMIN 3.7 3.2* 3.2*   No results for input(s): LIPASE, AMYLASE in the last 168 hours. No results for input(s): AMMONIA in the last 168 hours. Coagulation Profile: No results for input(s): INR, PROTIME in the last 168 hours. Cardiac Enzymes: Recent Labs  Lab 05/17/19 1929 05/18/19 0014 05/18/19 0804  TROPONINI <0.03 <0.03 <0.03   BNP (last 3 results) No results for input(s): PROBNP in the last 8760 hours. HbA1C: No results  for input(s): HGBA1C in the last 72 hours. CBG: Recent Labs  Lab 05/18/19 1148 05/18/19 1616 05/18/19 2116 05/19/19 0744 05/19/19 1131  GLUCAP 167* 101* 114* 105* 127*   Lipid Profile: No results for input(s): CHOL, HDL, LDLCALC, TRIG, CHOLHDL, LDLDIRECT in the last 72 hours. Thyroid Function Tests: No results for input(s): TSH, T4TOTAL, FREET4, T3FREE, THYROIDAB in the last 72 hours. Anemia Panel: Recent Labs    05/19/19 0603  VITAMINB12 302  FOLATE 19.2  FERRITIN 101  TIBC 240*  IRON 33  RETICCTPCT 1.1   Sepsis Labs: No results for input(s): PROCALCITON, LATICACIDVEN in the last 168 hours.  Recent Results (from the past 240 hour(s))  Novel Coronavirus, NAA (hospital order; send-out to ref lab)     Status: None   Collection Time: 05/11/19 11:43 AM  Result Value Ref Range Status   SARS-CoV-2, NAA NOT DETECTED NOT DETECTED Final    Comment: (NOTE) This test was developed and its performance characteristics determined by [employer]. This test has not been FDA cleared or approved. This test has been authorized by FDA under an Emergency Use Authorization (EUA). This test is only authorized for the duration of time the declaration that circumstances exist justifying the authorization of the emergency use of in vitro diagnostic tests for detection of SARS-CoV-2 virus and/or diagnosis of COVID-19 infection  under section 564(b)(1) of the Act, 21 U.S.C. 128NOM-7(E)(7), unless the authorization is terminated or revoked sooner. When diagnostic testing is negative, the possibility of a false negative result should be considered in the context of a patient's recent exposures and the presence of clinical signs and symptoms consistent with COVID-19. An individual without symptoms of COVID-19 and who is not shedding SARS-CoV-2 virus would expect to have a negative (not detected) result in this assay. Performed  At: [facility] [address] [place], [place] [id number] Rush Farmer MD EZ:[phone]    [place]  Final    Comment: Performed at [facility], [place] [address], [place], [place] [address]  MRSA PCR Screening     Status: None   Collection Time: 05/17/19  6:50 PM  Result Value Ref Range Status   MRSA by PCR NEGATIVE NEGATIVE Final    Comment:        The GeneXpert MRSA Assay (FDA approved for NASAL specimens only), is one component of a comprehensive MRSA colonization surveillance program. It is not intended to diagnose MRSA infection nor to guide or monitor treatment for MRSA infections. Performed at [facility], [place] [address], [place], [place] [address]     Radiology Studies: No results found.  Scheduled Meds: . acetaminophen  650 mg Oral Q6H  . aspirin  81 mg Oral Daily  . Chlorhexidine Gluconate Cloth  6 each Topical Daily  . feeding supplement  237 mL Oral BID BM  . heparin injection (subcutaneous)  5,000 Units Subcutaneous Q8H  . insulin aspart  0-5 Units Subcutaneous QHS  . insulin aspart  0-9 Units Subcutaneous TID WC  . lisinopril  20 mg Oral Daily  . mouth rinse  15 mL Mouth Rinse BID  . metoprolol succinate  100 mg Oral Daily  . phosphorus  500 mg Oral BID  . pravastatin  20 mg Oral q1800   Continuous Infusions:   LOS: 2 days   Kerney Elbe, DO [facility] PAGER is on AMION  If  7PM-7AM, please contact night-coverage [email or web address] Password [facility] 05/19/2019, 12:41 PM

## 2019-05-19 NOTE — Discharge Instructions (Signed)
POST OP INSTRUCTIONS AFTER COLON SURGERY  1. DIET: Be sure to include lots of fluids daily to stay hydrated - 64oz of water per day (8, 8 oz glasses).  Avoid fast food or heavy meals for the first couple of weeks as your are more likely to get nauseated. Avoid raw/uncooked fruits or vegetables for the first 4 weeks (its ok to have these if they are blended into smoothie form). If you have fruits/vegetables, make sure they are cooked until soft enough to mash on the roof of your mouth and chew your food well. Otherwise, diet as tolerated.  2. Take your usually prescribed home medications unless otherwise directed.  3. PAIN CONTROL: a. Pain is best controlled by a usual combination of three different methods TOGETHER: i. Ice/Heat ii. Over the counter pain medication iii. Prescription pain medication b. Most patients will experience some swelling and bruising around the surgical site.  Ice packs or heating pads (30-60 minutes up to 6 times a day) will help. Some people prefer to use ice alone, heat alone, alternating between ice & heat.  Experiment to what works for you.  Swelling and bruising can take several weeks to resolve.   c. It is helpful to take an over-the-counter pain medication regularly for the first few weeks: i. Acetaminophen (Tylenol) - you may take 650mg  every 6 hours as needed. If you are taking a narcotic pain medication that has acetaminophen in it, do not take over the counter tylenol at the same time. d. A  prescription for pain medication should be given to you upon discharge.  Take your pain medication as prescribed if your pain is not adequatly controlled with the over-the-counter pain reliefs mentioned above.  4. Avoid getting constipated.  Between the surgery and the pain medications, it is common to experience some constipation.  Increasing fluid intake and taking a fiber supplement (such as Metamucil, Citrucel, FiberCon, MiraLax, etc) 1-2 times a day regularly will usually  help prevent this problem from occurring.  A mild laxative (prune juice, Milk of Magnesia, MiraLax, etc) should be taken according to package directions if there are no bowel movements after 48 hours.    5. Dressing: Your incisions are covered in Dermabond which is like sterile superglue for the skin. This will come off on it's own in a couple weeks. It is waterproof and you may bathe normally starting the day after your surgery in a shower. Avoid baths/pools/lakes/oceans until your wounds have fully healed. 6. Colostomy care - as instructed in the hospital by our wound ostomy continence nurses. You typically need to empty the bag a couple times per day or "burp" when filling up with gas. The bag itself is typically changed a couple times per week. To help with the transition process, a home health nurse has also been arranged to assist in ostomy care while you are learning.  7. ACTIVITIES as tolerated:   a. Avoid heavy lifting (>10lbs or 1 gallon of milk) for the next 6 weeks. b. You may resume regular daily activities as tolerated--such as daily self-care, walking, climbing stairs--gradually increasing activities as tolerated.  If you can walk 30 minutes without difficulty, it is safe to try more intense activity such as jogging, treadmill, bicycling, low-impact aerobics.  c. DO NOT PUSH THROUGH PAIN.  Let pain be your guide: If it hurts to do something, don't do it. d. Dennis Bast may drive when you are no longer taking prescription pain medication, you can comfortably wear a seatbelt, and  you can safely maneuver your car and apply brakes.  8. FOLLOW UP in our office a. Please call [facility] at [phone] to set up an appointment to see your surgeon in the office for a follow-up appointment approximately 2 weeks after your surgery. b. Make sure that you call for this appointment the day you arrive home to insure a convenient appointment time.  9. If you have disability or family leave forms that need to be  completed, you may have them completed by your primary care physician's office; for return to work instructions, please ask our office staff and they will be happy to assist you in obtaining this documentation   When to call us [phone]: 1. Poor pain control 2. Reactions / problems with new medications (rash/itching, etc)  3. Fever over 101.5 F (38.5 C) 4. Inability to urinate 5. Nausea/vomiting 6. Worsening swelling or bruising 7. Continued bleeding from incision. 8. Increased pain, redness, or drainage from the incision  The clinic staff is available to answer your questions during regular business hours (8:30am-5pm).  Please dont hesitate to call and ask to speak to one of our nurses for clinical concerns.   A surgeon from [facility] is always on call at the hospitals   If you have a medical emergency, go to the nearest emergency room or call 911.  [facility], [place] [address] [place], [place], [place]  [address] MAIN: [phone] FAX: [phone] [email or web address]

## 2019-05-20 LAB — GLUCOSE, CAPILLARY
Glucose-Capillary: 109 mg/dL — ABNORMAL HIGH (ref 70–99)
Glucose-Capillary: 169 mg/dL — ABNORMAL HIGH (ref 70–99)
Glucose-Capillary: 119 mg/dL — ABNORMAL HIGH (ref 70–99)
Glucose-Capillary: 162 mg/dL — ABNORMAL HIGH (ref 70–99)

## 2019-05-20 LAB — BASIC METABOLIC PANEL
Anion gap: 7 (ref 5–15)
BUN: 13 mg/dL (ref 8–23)
CO2: 25 mmol/L (ref 22–32)
Calcium: 8.5 mg/dL — ABNORMAL LOW (ref 8.9–10.3)
Chloride: 107 mmol/L (ref 98–111)
Creatinine, Ser: 0.86 mg/dL (ref 0.44–1.00)
GFR calc Af Amer: >60 mL/min (ref >60)
GFR calc non Af Amer: >60 mL/min (ref >60)
Glucose, Bld: 115 mg/dL — ABNORMAL HIGH (ref 70–99)
Potassium: 3.6 mmol/L (ref 3.5–5.1)
Sodium: 139 mmol/L (ref 135–145)

## 2019-05-20 LAB — PHOSPHORUS: Phosphorus: 3 mg/dL (ref 2.5–4.6)

## 2019-05-20 LAB — MAGNESIUM: Magnesium: 1.9 mg/dL (ref 1.7–2.4)

## 2019-05-20 NOTE — Progress Notes (Signed)
Physical Therapy Treatment Patient Details Name: ZELIE ASBILL MRN: [id number] DOB: February 20, 1929 Today's Date: 05/20/2019    History of Present Illness s/p colostomy due to rectal mass.  PMH:  HTN and DM    PT Comments    Progressing with mobility. Will continue to progress ambulation. Will also plan to practice stair negotiation prior to d/c.    Follow Up Recommendations  Home health PT;Supervision/Assistance - 24 hour     Equipment Recommendations  None recommended by PT    Recommendations for Other Services       Precautions / Restrictions Precautions Precautions: Fall Precaution Comments: recent ABD surgery/new Colostomy  Restrictions Weight Bearing Restrictions: No    Mobility  Bed Mobility               General bed mobility comments: oob in recliner  Transfers Overall transfer level: Needs assistance Equipment used: Rolling walker (2 wheeled) Transfers: Sit to/from Stand Sit to Stand: Min assist         General transfer comment: Small amount of assist to power up. VCs safety, technique, hand placement.   Ambulation/Gait Ambulation/Gait assistance: Min guard Gait Distance (Feet): 125 Feet Assistive device: Rolling walker (2 wheeled) Gait Pattern/deviations: Step-through pattern;Decreased stride length     General Gait Details: close guard for safety. fair gait speed. Pt was fatigued after ~100 feet. Dyspnea 2/4.    Stairs             Wheelchair Mobility    Modified Rankin (Stroke Patients Only)       Balance Overall balance assessment: Needs assistance         Standing balance support: Bilateral upper extremity supported Standing balance-Leahy Scale: Poor                              Cognition Arousal/Alertness: Awake/alert Behavior During Therapy: WFL for tasks assessed/performed Overall Cognitive Status: Within Functional Limits for tasks assessed                                         Exercises      General Comments        Pertinent Vitals/Pain Pain Assessment: Faces Faces Pain Scale: Hurts even more Pain Location: abdomen with movement Pain Descriptors / Indicators: Sore;Grimacing Pain Intervention(s): Monitored during session    Home Living                      Prior Function            PT Goals (current goals can now be found in the care plan section) Progress towards PT goals: Progressing toward goals    Frequency    Min 3X/week      PT Plan Current plan remains appropriate    Co-evaluation              AM-PAC PT "6 Clicks" Mobility   Outcome Measure  Help needed turning from your back to your side while in a flat bed without using bedrails?: A Lot Help needed moving from lying on your back to sitting on the side of a flat bed without using bedrails?: A Lot Help needed moving to and from a bed to a chair (including a wheelchair)?: A Little Help needed standing up from a chair using your arms (e.g., wheelchair or bedside chair)?: A  Little Help needed to walk in hospital room?: A Little Help needed climbing 3-5 steps with a railing? : A Little 6 Click Score: 16    End of Session   Activity Tolerance: Patient tolerated treatment well Patient left: in chair;with call bell/phone within reach;with chair alarm set   PT Visit Diagnosis: Unsteadiness on feet (R26.81);Muscle weakness (generalized) (M62.81);Pain;Difficulty in walking, not elsewhere classified (R26.2) Pain - part of body: (abdomen)     Time: 7185-5015 PT Time Calculation (min) (ACUTE ONLY): 10 min  Charges:  $Gait Training: 8-22 mins                        Weston Anna, [place] Pager: [phone] Office: [phone]

## 2019-05-20 NOTE — Progress Notes (Signed)
3 Days Post-Op   Subjective/Chief Complaint: Complains of pain at incisiion. Otherwise ok   Objective: Vital signs in last 24 hours: Temp:  [98.2 F (36.8 C)-98.6 F (37 C)] 98.2 F (36.8 C) (05/23 0518) Pulse Rate:  [52-60] 52 (05/23 0518) Resp:  [16-20] 20 (05/23 0518) BP: (101-137)/(55-64) 137/64 (05/23 0518) SpO2:  [93 %-96 %] 93 % (05/23 0518) Weight:  [88.9 kg] 88.9 kg (05/23 0518) Last BM Date: 05/18/19(small stool in colostomy)  Intake/Output from previous day: 05/22 0701 - 05/23 0700 In: 290 [P.O.:290] Out: 975 [Urine:975] Intake/Output this shift: No intake/output data recorded.  General appearance: alert and cooperative Resp: clear to auscultation bilaterally Cardio: regular rate and rhythm GI: soft, mild tenderness. ostomy pink and productive  Lab Results:  Recent Labs    05/18/19 0014 05/19/19 0603  WBC 15.3* 11.1*  HGB 11.6* 11.2*  HCT 37.7 34.6*  PLT 224 195   BMET Recent Labs    05/19/19 0603 05/20/19 0523  NA 138 139  K 4.1 3.6  CL 108 107  CO2 24 25  GLUCOSE 105* 115*  BUN 12 13  CREATININE 0.95 0.86  CALCIUM 8.6* 8.5*   PT/INR No results for input(s): LABPROT, INR in the last 72 hours. ABG No results for input(s): PHART, HCO3 in the last 72 hours.  Invalid input(s): PCO2, PO2  Studies/Results: No results found.  Anti-infectives: Anti-infectives (From admission, onward)   Start     Dose/Rate Route Frequency Ordered Stop   05/17/19 1400  neomycin (MYCIFRADIN) tablet 1,000 mg  Status:  Discontinued     1,000 mg Oral 3 times per day 05/17/19 0842 05/17/19 0849   05/17/19 1400  metroNIDAZOLE (FLAGYL) tablet 1,000 mg  Status:  Discontinued     1,000 mg Oral 3 times per day 05/17/19 0175 05/17/19 0849   05/17/19 0900  cefoTEtan (CEFOTAN) 2 g in sodium chloride 0.9 % 100 mL IVPB     2 g 200 mL/hr over 30 Minutes Intravenous On call to O.R. 05/17/19 0845 05/17/19 1303   05/17/19 0845  cefoTEtan (CEFOTAN) 2 g in sodium chloride 0.9 %  100 mL IVPB  Status:  Discontinued     2 g 200 mL/hr over 30 Minutes Intravenous On call to O.R. 05/17/19 1025 05/17/19 0843   05/17/19 0845  cefoTEtan (CEFOTAN) 2 g in sodium chloride 0.9 % 100 mL IVPB  Status:  Discontinued     2 g 200 mL/hr over 30 Minutes Intravenous On call to O.R. 05/17/19 8527 05/17/19 0843      Assessment/Plan: s/p Procedure(s): XI ROBOTIC ASSISTED LOWER ANTERIOR RESECTION WITH END COLOSTOMY (N/A) FLEXIBLE SIGMOIDOSCOPY (N/A) Advance diet  Ostomy teaching Patient Active Problem List   Diagnosis Date Noted  . Rectal mass 05/17/2019  . Insomnia 09/30/2018  . Abnormal gait 09/30/2018  . Bilateral impacted cerumen 09/30/2018  . Chronic diarrhea 09/30/2018  . Type 2 diabetes mellitus with stage 3 chronic kidney disease, without long-term current use of insulin ([place]) 09/30/2018  . Hyperlipidemia 09/30/2018  . Nosebleed 09/30/2018  . Does use hearing aid 09/30/2018  . Positive colorectal cancer screening using Cologuard test 08/12/2018  . HTN (hypertension) 08/12/2018   XI ROBOTIC ASSISTED LOWER ANTERIOR RESECTION WITH END COLOSTOMY FLEXIBLE SIGMOIDOSCOPY 05/17/2019  POD#3  -Recovering well; no issues with Afib/rvr since OR -Advance to soft diet -WOCN following - apprecaite assistance -PT/OT - recommending home health PT/OT -D/C foley -Medicine following for assistance in management of comorbidities - appreciate their assistance in her care -Home  PT/OT and a skilled nurse requested (new colostomy). Will need family to attend colostomy changing/teaching prior to discharge   LOS: 3 days    Autumn Messing III 05/20/2019

## 2019-05-20 NOTE — Progress Notes (Signed)
PROGRESS NOTE    Heidi Whitaker  ZOX:[id number] DOB: 09-27-1929 DOA: 05/17/2019 PCP: McLean-Scocuzza, Nino Glow, MD  Brief Narrative:  Heidi Whitaker is an 85 y.o. female with a past medical history significant for but not limited to hypertension, hyperlipidemia, diabetes mellitus type 2 on oral hypoglycemics, along with obesity and chronic diarrhea who presented for an elective surgical procedure today.  Patient has been having chronic diarrhea for the past couple years and had a history of a positive Cologuard test in 2017 and underwent a colonoscopy earlier in January which was notable for an endoscopic clearly unresectable polyp in the cecum and endoscopic unresectable polyp in the rectum which was frond-like/villous and polypoid.  She underwent CT scans of the abdomen pelvis did not visualize lesion seen her colon and rectum.  She denied any abdominal pain, nausea or vomiting besides having chronic diarrhea and stated that her stools been mucousy in the past couple years.  She underwent cardiac clearance and underwent MRI pelvis for rectal polypoid mass and this demonstrated findings of probable invasion of the muscularis propria and was read as a rectosigmoid T3N 0/1 mass.  She presented to the general surgeon's office and the consensus was to proceed with a low anterior resection and not treated with neoadjuvant therapy.  She underwent a robotic assisted low anterior resection with a end colostomy on and was seen in the PACU at the request of Dr. Dema Severin for medical management for comorbid conditions.  After she had surgical intervention she underwent transient atrial fibrillation with RVR which was abated by a dose of IV metoprolol.  She went to the stepdown unit in stable condition.  Currently POD3 and was transferred to Medical Floor.  Diet is being advanced and she is improving.  PT OT evaluating and recommending home health.  Foley catheter discontinued today and patient will have a  colostomy teaching by the [facility] Nurse. Diet advanced to Soft Yesterday and patient tolerating it well  Assessment & Plan:   Active Problems:   Rectal mass  Rectal Mass s/p Robotic LOA with End Colostomy -POD3 -Further Care per General Surgery including Pain Control with Oxyxodone and Fentanyl  -Diet Advancement per General Surgery and advancing to Soft Today -WOC nurse consulted   -DVT Prophylaxis per Surgery  -PT/OT recommending Home Health and anticipate D/C Home in the next few days   Hypertension -Blood Pressure slightly elevated yesterday at 144/62 but this afternoon was 102/52 -Antihypertensives had been held but are planning to be resumed -Was Holding Lisinopril again today due to slight bump in Cr but now that Cr is improved will resume Lisinopril 20 mg po Daily today  -Metoprolol 100 mg po Daily to be resumed this AM per General Surgery -Continue to Monitor BP carefully  HLD -Agree with Resuming Lovastatin substitution with Pravastatin 20 mg po Daily  Diabetes Mellitus Type 2 -Currently holding Glimperide 1 tab (2 mg po Daily) -Agree with Starting on Sensitive Novolog SSI AC/HS -CBG's currently ranging from 109-169 -Continue to Monitor and Trend CBG's; This AM Blood Glucose was 115 on BMP   Transient A Fib with RVR now in NSR -Converted to NSR after given a dose of IV Metoprolol -Continue to Monitor on Telemetry -Likely related to Surgical Intervention -CHA2DS2-VASc of 5 (Age, Sex, HTN, Diabetes) so is at a high risk for CVA -Since it was related to Surgical intervention and short-lived will hold off on Starting Anticoagulation currently, especially since she just had surgery -IF patient goes back into  Atrial Fibrillation will need Anticoagulation -Cycle Cardiac Troponin's; Troponin was <0.03 x2  -Will need to review ECHO done for Pre-operative Clearance -Will need to Request Records from [facility] showed LVEF of 66%; Read as  intermediate to Low risk Scan as there was borderline normal myocardial perfusion as there was a small area of apical defect possibly apical thinning and no clear evidence of ischemia  -Will hold off on Anticoagulation currently and patient will need to follow up with Cardiology as an outpatient for further workup -Resumed Home Metoprolol yesterday AM  -If goes back into Atrial Fibrillation here will need Inpatient ECHO and Cardiac Consultation.  -Follow up with Cardiology in the Outpatient setting after D/C  Obesity -Estimated body mass index is 33.64 kg/m as calculated from the following:   Height as of this encounter: 5\' 4"  (1.626 m).   Weight as of this encounter: 88.9 kg. -Weight Loss and Dietary Counseling Given -Will need Nutritionist Evaluation when Diet is advanced.   CKD Stage 3 -Cr improved now; BUN/Cr went from 21/1.00 -> 17/1.10 -> 12/0.95 -> 13/0.86 -BP fairly well controlled so will hold Lisinopril at this time; Avoid Hypotension -IVF Hydration now stopped  -Continue to monitor and Trend Renal Fxn -Repeat CMP in AM and since Cr improved resuming Lisinopril   Normocytic Anemia -Hgb/Hct went from 13.6/41.7 -> 11.6/37.7 -> 11.2/34.6 and was not checked today  -Likely expected Post-operative Drop -Checked Anemia Panel with iron level of 33, U IBC of 2 7, TIBC of 240, saturation was 14%, ferritin level 101, folate of 19.2, and vitamin B12 of 302, -Continue to Monitor for S/Sx of Bleeding -Repeat CBC in AM  Leukocytosis -Patient's WBC went from 11.2 -> 17.6 -> 15.3 -> 11.1; Repeat Not done today  -Trending down now after Surgery -Continue to Monitor and Trend WBC -Repeat CBC in AM   Hypophosphatemia -Patient's phosphorus level this morning was 3.0 -Continue to monitor replete as necessary -Repeat Phos Level in a.m.  Asymptomatic Bradycardia -Currently not symptomatic -Will need to watch carefully as patient is on Metoprolol with Holding parameters  -HR ranging  from 56-78  DVT prophylaxis: Heparin 5,000 units sq q8h Code Status: FULL CODE Family Communication: No Family present at bedside  Disposition Plan: Per Primary Team and anticipating D/C in next 1-2 days   Consultants:  [facility] is Primary    Procedures:  1. Robotic assisted low anterior resection with end colostomy 2. Flexible sigmoidoscopy   Antimicrobials:  Anti-infectives (From admission, onward)   Start     Dose/Rate Route Frequency Ordered Stop   05/17/19 1400  neomycin (MYCIFRADIN) tablet 1,000 mg  Status:  Discontinued     1,000 mg Oral 3 times per day 05/17/19 0842 05/17/19 0849   05/17/19 1400  metroNIDAZOLE (FLAGYL) tablet 1,000 mg  Status:  Discontinued     1,000 mg Oral 3 times per day 05/17/19 0842 05/17/19 0849   05/17/19 0900  cefoTEtan (CEFOTAN) 2 g in sodium chloride 0.9 % 100 mL IVPB     2 g 200 mL/hr over 30 Minutes Intravenous On call to O.R. 05/17/19 0845 05/17/19 1303   05/17/19 0845  cefoTEtan (CEFOTAN) 2 g in sodium chloride 0.9 % 100 mL IVPB  Status:  Discontinued     2 g 200 mL/hr over 30 Minutes Intravenous On call to O.R. 05/17/19 7106 05/17/19 0843   05/17/19 0845  cefoTEtan (CEFOTAN) 2 g in sodium chloride 0.9 % 100 mL IVPB  Status:  Discontinued     2 g 200 mL/hr over 30 Minutes Intravenous On call to O.R. 05/17/19 3785 05/17/19 8850     Subjective: Seen and examined at bedside and states that her abdomen is only sore when she turns or attempts to stand up.  This hurts when she moves.  No chest pain, lightheadedness or dizziness.  No nausea or vomiting.  Tolerating her diet well but complaining about the food this morning and how it was cold.  No other concerns or complaints at this time.  Objective: Vitals:   05/19/19 2156 05/20/19 0518 05/20/19 1008 05/20/19 1328  BP: 116/60 137/64  (!) 102/52  Pulse: (!) 58 (!) 52 78 (!) 56  Resp: 18 20  14   Temp: 98.3 F (36.8 C) 98.2 F (36.8 C)  98.2 F (36.8 C)  TempSrc: Oral Oral  Oral   SpO2: 93% 93% 98% 97%  Weight:  88.9 kg    Height:        Intake/Output Summary (Last 24 hours) at 05/20/2019 1428 Last data filed at 05/20/2019 1100 Gross per 24 hour  Intake 290 ml  Output 1225 ml  Net -935 ml   Filed Weights   05/17/19 1839 05/19/19 0542 05/20/19 0518  Weight: 90.3 kg 88.9 kg 88.9 kg   Examination: Physical Exam:  Constitutional: Well-nourished, well-developed obese Caucasian female currently no acute distress appears calm and comfortable watching television and complaining about the food Eyes: Lids and conjunctive are normal.  Sclera anicteric ENMT: External ears nose appear normal.  Grossly normal hearing.  Patient is hard of hearing Neck: Appears supple no JVD Respiratory: Slightly diminished to auscultation bilaterally no appreciable wheezing, rales, rhonchi. Cardiovascular: Slightly bradycardic but regular rhythm.  No appreciable murmurs, rubs, gallops. Abdomen: Soft, mildly tender, distended secondary body habitus.  Bowel sounds present.  Abdominal incisions appear clean dry and intact.  Has a left lower quadrant colostomy with ostomy pouch having some air in it along with some dark liquid stool GU: Deferred Musculoskeletal: No contractures or cyanosis. Skin: Skin is warm and dry no appreciable rashes or lesions on limited skin evaluation.  Abdominal incisions appear clean dry and intact.  She has a colostomy in place with a bag with air and stool Neurologic: Cranial nerves II through XII grossly intact no appreciable focal deficits.  Romberg sign is cerebellar reflexes were not assessed Psychiatric: Normal judgment and insight.  Patient is awake, alert, oriented.  Has a pleasant mood and affect.  Data Reviewed: I have personally reviewed following labs and imaging studies  CBC: Recent Labs  Lab 05/17/19 1929 05/18/19 0014 05/19/19 0603  WBC 17.6* 15.3* 11.1*  NEUTROABS 14.1* 10.5* 5.8  HGB 12.2 11.6* 11.2*  HCT 39.3 37.7 34.6*  MCV 95.2 95.4 95.1   PLT 236 224 277   Basic Metabolic Panel: Recent Labs  Lab 05/17/19 1929 05/18/19 0014 05/19/19 0603 05/20/19 0523  NA 137 136 138 139  K 4.1 4.7 4.1 3.6  CL 105 105 108 107  CO2 25 25 24 25   GLUCOSE 192* 140* 105* 115*  BUN 19 17 12 13   CREATININE 1.04* 1.10* 0.95 0.86  CALCIUM 8.8* 8.5* 8.6* 8.5*  MG 2.1 2.0 2.1 1.9  PHOS 3.7 3.9 2.1* 3.0   GFR: Estimated Creatinine Clearance: 49.8 mL/min (by C-G formula based on SCr of 0.86 mg/dL). Liver Function Tests: Recent Labs  Lab 05/17/19 1929 05/18/19 0014 05/19/19 0603  AST 19 18 20   ALT 11 12 11   ALKPHOS 39 37*  34*  BILITOT 0.9 0.8 0.8  PROT 6.6 5.9* 6.1*  ALBUMIN 3.7 3.2* 3.2*   No results for input(s): LIPASE, AMYLASE in the last 168 hours. No results for input(s): AMMONIA in the last 168 hours. Coagulation Profile: No results for input(s): INR, PROTIME in the last 168 hours. Cardiac Enzymes: Recent Labs  Lab 05/17/19 1929 05/18/19 0014 05/18/19 0804  TROPONINI <0.03 <0.03 <0.03   BNP (last 3 results) No results for input(s): PROBNP in the last 8760 hours. HbA1C: No results for input(s): HGBA1C in the last 72 hours. CBG: Recent Labs  Lab 05/19/19 1131 05/19/19 1625 05/19/19 2151 05/20/19 0736 05/20/19 1203  GLUCAP 127* 137* 118* 109* 169*   Lipid Profile: No results for input(s): CHOL, HDL, LDLCALC, TRIG, CHOLHDL, LDLDIRECT in the last 72 hours. Thyroid Function Tests: No results for input(s): TSH, T4TOTAL, FREET4, T3FREE, THYROIDAB in the last 72 hours. Anemia Panel: Recent Labs    05/19/19 0603  VITAMINB12 302  FOLATE 19.2  FERRITIN 101  TIBC 240*  IRON 33  RETICCTPCT 1.1   Sepsis Labs: No results for input(s): PROCALCITON, LATICACIDVEN in the last 168 hours.  Recent Results (from the past 240 hour(s))  Novel Coronavirus, NAA (hospital order; send-out to ref lab)     Status: None   Collection Time: 05/11/19 11:43 AM  Result Value Ref Range Status   SARS-CoV-2, NAA NOT DETECTED NOT  DETECTED Final    Comment: (NOTE) This test was developed and its performance characteristics determined by [employer]. This test has not been FDA cleared or approved. This test has been authorized by FDA under an Emergency Use Authorization (EUA). This test is only authorized for the duration of time the declaration that circumstances exist justifying the authorization of the emergency use of in vitro diagnostic tests for detection of SARS-CoV-2 virus and/or diagnosis of COVID-19 infection under section 564(b)(1) of the Act, 21 U.S.C. 397QBH-4(L)(9), unless the authorization is terminated or revoked sooner. When diagnostic testing is negative, the possibility of a false negative result should be considered in the context of a patient's recent exposures and the presence of clinical signs and symptoms consistent with COVID-19. An individual without symptoms of COVID-19 and who is not shedding SARS-CoV-2 virus would expect to have a negative (not detected) result in this assay. Performed  At: [facility] [address] [place], [place] [id number] Rush Farmer MD DZ:[id number]    [place]  Final    Comment: Performed at [facility], [place] [address] [place], [place] [address]  MRSA PCR Screening     Status: None   Collection Time: 05/17/19  6:50 PM  Result Value Ref Range Status   MRSA by PCR NEGATIVE NEGATIVE Final    Comment:        The GeneXpert MRSA Assay (FDA approved for NASAL specimens only), is one component of a comprehensive MRSA colonization surveillance program. It is not intended to diagnose MRSA infection nor to guide or monitor treatment for MRSA infections. Performed at [facility], [place] [address], [place], [place] [address]     Radiology Studies: No results found.  Scheduled Meds: . acetaminophen  650 mg Oral Q6H  . aspirin  81 mg Oral Daily  . Chlorhexidine Gluconate Cloth  6 each  Topical Daily  . feeding supplement  237 mL Oral BID BM  . heparin injection (subcutaneous)  5,000 Units Subcutaneous Q8H  . insulin aspart  0-5 Units Subcutaneous QHS  . insulin aspart  0-9 Units Subcutaneous TID WC  . lisinopril  20 mg Oral Daily  . mouth rinse  15 mL Mouth Rinse BID  . metoprolol succinate  100 mg Oral Daily  . pravastatin  20 mg Oral q1800   Continuous Infusions:   LOS: 3 days   Kerney Elbe, DO [facility] PAGER is on [place]  If 7PM-7AM, please contact night-coverage [email or web address] Password [email or web address] 05/20/2019, 2:28 PM

## 2019-05-21 DIAGNOSIS — E119 Type 2 diabetes mellitus without complications: Secondary | ICD-10-CM

## 2019-05-21 DIAGNOSIS — E1169 Type 2 diabetes mellitus with other specified complication: Secondary | ICD-10-CM

## 2019-05-21 DIAGNOSIS — E669 Obesity, unspecified: Secondary | ICD-10-CM

## 2019-05-21 LAB — BASIC METABOLIC PANEL
Anion gap: 6 (ref 5–15)
BUN: 14 mg/dL (ref 8–23)
CO2: 25 mmol/L (ref 22–32)
Calcium: 8.4 mg/dL — ABNORMAL LOW (ref 8.9–10.3)
Chloride: 107 mmol/L (ref 98–111)
Creatinine, Ser: 0.84 mg/dL (ref 0.44–1.00)
GFR calc Af Amer: >60 mL/min (ref >60)
GFR calc non Af Amer: >60 mL/min (ref >60)
Glucose, Bld: 118 mg/dL — ABNORMAL HIGH (ref 70–99)
Potassium: 3.6 mmol/L (ref 3.5–5.1)
Sodium: 138 mmol/L (ref 135–145)

## 2019-05-21 LAB — GLUCOSE, CAPILLARY
Glucose-Capillary: 120 mg/dL — ABNORMAL HIGH (ref 70–99)
Glucose-Capillary: 157 mg/dL — ABNORMAL HIGH (ref 70–99)
Glucose-Capillary: 152 mg/dL — ABNORMAL HIGH (ref 70–99)
Glucose-Capillary: 117 mg/dL — ABNORMAL HIGH (ref 70–99)

## 2019-05-21 NOTE — Progress Notes (Signed)
4 Days Post-Op   Subjective/Chief Complaint: Complains of pain at incisiion. Otherwise ok   Objective: Vital signs in last 24 hours: Temp:  [98.2 F (36.8 C)-98.7 F (37.1 C)] 98.5 F (36.9 C) (05/24 0533) Pulse Rate:  [56-78] 65 (05/24 0533) Resp:  [14-20] 20 (05/24 0533) BP: (102-150)/(52-72) 142/60 (05/24 0533) SpO2:  [91 %-98 %] 91 % (05/24 0533) Weight:  [88.5 kg] 88.5 kg (05/24 0500) Last BM Date: 05/18/19(small stool in colostomy)  Intake/Output from previous day: 05/23 0701 - 05/24 0700 In: 660 [P.O.:660] Out: 850 [Urine:800; Stool:50] Intake/Output this shift: No intake/output data recorded.  General appearance: alert and cooperative Resp: clear to auscultation bilaterally Cardio: regular rate and rhythm GI: soft, mild tenderness. ostomy pink and productive  Lab Results:  Recent Labs    05/19/19 0603  WBC 11.1*  HGB 11.2*  HCT 34.6*  PLT 195   BMET Recent Labs    05/20/19 0523 05/21/19 0456  NA 139 138  K 3.6 3.6  CL 107 107  CO2 25 25  GLUCOSE 115* 118*  BUN 13 14  CREATININE 0.86 0.84  CALCIUM 8.5* 8.4*   PT/INR No results for input(s): LABPROT, INR in the last 72 hours. ABG No results for input(s): PHART, HCO3 in the last 72 hours.  Invalid input(s): PCO2, PO2  Studies/Results: No results found.  Anti-infectives: Anti-infectives (From admission, onward)   Start     Dose/Rate Route Frequency Ordered Stop   05/17/19 1400  neomycin (MYCIFRADIN) tablet 1,000 mg  Status:  Discontinued     1,000 mg Oral 3 times per day 05/17/19 0842 05/17/19 0849   05/17/19 1400  metroNIDAZOLE (FLAGYL) tablet 1,000 mg  Status:  Discontinued     1,000 mg Oral 3 times per day 05/17/19 1610 05/17/19 0849   05/17/19 0900  cefoTEtan (CEFOTAN) 2 g in sodium chloride 0.9 % 100 mL IVPB     2 g 200 mL/hr over 30 Minutes Intravenous On call to O.R. 05/17/19 0845 05/17/19 1303   05/17/19 0845  cefoTEtan (CEFOTAN) 2 g in sodium chloride 0.9 % 100 mL IVPB  Status:   Discontinued     2 g 200 mL/hr over 30 Minutes Intravenous On call to O.R. 05/17/19 9604 05/17/19 0843   05/17/19 0845  cefoTEtan (CEFOTAN) 2 g in sodium chloride 0.9 % 100 mL IVPB  Status:  Discontinued     2 g 200 mL/hr over 30 Minutes Intravenous On call to O.R. 05/17/19 5409 05/17/19 0843      Assessment/Plan: s/p Procedure(s): XI ROBOTIC ASSISTED LOWER ANTERIOR RESECTION WITH END COLOSTOMY (N/A) FLEXIBLE SIGMOIDOSCOPY (N/A)  Ostomy teaching Patient Active Problem List   Diagnosis Date Noted  . Rectal mass 05/17/2019  . Insomnia 09/30/2018  . Abnormal gait 09/30/2018  . Bilateral impacted cerumen 09/30/2018  . Chronic diarrhea 09/30/2018  . Type 2 diabetes mellitus with stage 3 chronic kidney disease, without long-term current use of insulin ([place]) 09/30/2018  . Hyperlipidemia 09/30/2018  . Nosebleed 09/30/2018  . Does use hearing aid 09/30/2018  . Positive colorectal cancer screening using Cologuard test 08/12/2018  . HTN (hypertension) 08/12/2018   XI ROBOTIC ASSISTED LOWER ANTERIOR RESECTION WITH END COLOSTOMY FLEXIBLE SIGMOIDOSCOPY 05/17/2019  POD#4  -Recovering well; no issues with Afib/rvr since OR -Cont soft diet -WOCN following - apprecaite assistance -PT/OT - recommending home health PT/OT -Medicine following for assistance in management of comorbidities - appreciate their assistance in her care -Home PT/OT and a skilled nurse requested (new colostomy). Will need  family to attend colostomy changing/teaching prior to discharge. -pt needs more ostomy teaching and PT/OT prior to d/c   LOS: 4 days    Rosario Adie [id number]

## 2019-05-21 NOTE — Progress Notes (Signed)
PROGRESS NOTE    Heidi Whitaker  ZOX:[id number] DOB: 03-18-1929 DOA: 05/17/2019 PCP: McLean-Scocuzza, Nino Glow, MD  Brief Narrative:  Heidi Whitaker is an 85 y.o. female with a past medical history significant for but not limited to hypertension, hyperlipidemia, diabetes mellitus type 2 on oral hypoglycemics, along with obesity and chronic diarrhea who presented for an elective surgical procedure today.  Patient has been having chronic diarrhea for the past couple years and had a history of a positive Cologuard test in 2017 and underwent a colonoscopy earlier in January which was notable for an endoscopic clearly unresectable polyp in the cecum and endoscopic unresectable polyp in the rectum which was frond-like/villous and polypoid.  She underwent CT scans of the abdomen pelvis did not visualize lesion seen her colon and rectum.  She denied any abdominal pain, nausea or vomiting besides having chronic diarrhea and stated that her stools been mucousy in the past couple years.  She underwent cardiac clearance and underwent MRI pelvis for rectal polypoid mass and this demonstrated findings of probable invasion of the muscularis propria and was read as a rectosigmoid T3N 0/1 mass.  She presented to the general surgeon's office and the consensus was to proceed with a low anterior resection and not treated with neoadjuvant therapy.  She underwent a robotic assisted low anterior resection with a end colostomy on and was seen in the PACU at the request of Dr. Dema Severin for medical management for comorbid conditions.  After she had surgical intervention she underwent transient atrial fibrillation with RVR which was abated by a dose of IV metoprolol.  She went to the stepdown unit in stable condition.  Currently POD3 and was transferred to Medical Floor.  Diet is being advanced and she is improving.  PT OT evaluating and recommending home health.  Foley catheter discontinued today and patient will have a  colostomy teaching by the [place] Nurse. Diet advanced to Soft Yesterday and patient tolerating it well  Assessment & Plan:   Active Problems:   Rectal mass  Rectal Mass s/p Robotic LOA with End Colostomy -POD4 -Further Care per General Surgery including Pain Control with Oxyxodone and Fentanyl  -Diet Advancement per General Surgery and remains on Soft -WOC nurse consulted; general surgery feels that she needs more ostomy teaching and PT OT prior to discharge -DVT Prophylaxis per Surgery  -PT/OT recommending Home Health and anticipate D/C Home in the next few days when cleared by General Surgery.  Hypertension -Blood Pressure over 60 -Antihypertensives had been held but have now been resumed  -Was Holding Lisinopril again today due to slight bump in Cr but now that Cr is improved will resume Lisinopril 20 mg po Daily  -Metoprolol 100 mg po Daily to be resumed this AM per General Surgery -Continue to Monitor BP carefully  HLD -Agree with Resuming Lovastatin substitution with Pravastatin 20 mg po Daily  Diabetes Mellitus Type 2 -Currently holding Glimperide 1 tab (2 mg po Daily) -Agree with Starting on Sensitive Novolog SSI AC/HS -CBG's currently ranging from 120-169 -Continue to Monitor and Trend CBG's; This AM Blood Glucose was 115 on BMP   Transient A Fib with RVR now in NSR -Converted to NSR after given a dose of IV Metoprolol -Continue to Monitor on Telemetry -Likely related to Surgical Intervention -CHA2DS2-VASc of 5 (Age, Sex, HTN, Diabetes) so is at a high risk for CVA -Since it was related to Surgical intervention and short-lived will hold off on Starting Anticoagulation currently, especially since she just  had surgery -IF patient goes back into Atrial Fibrillation will need Anticoagulation -Cycle Cardiac Troponin's; Troponin was <0.03 x2  -Will need to review ECHO done for Pre-operative Clearance -Will need to Request Records from [place]  showed LVEF of 66%; Read as intermediate to Low risk Scan as there was borderline normal myocardial perfusion as there was a small area of apical defect possibly apical thinning and no clear evidence of ischemia  -Will hold off on Anticoagulation currently and patient will need to follow up with Cardiology as an outpatient for further workup -Resumed Home Metoprolol yesterday AM  -If goes back into Atrial Fibrillation here will need Inpatient ECHO and Cardiac Consultation.  -Follow up with Cardiology in the Outpatient setting after D/C  Obesity -Estimated body mass index is 33.47 kg/m as calculated from the following:   Height as of this encounter: 5\' 4"  (1.626 m).   Weight as of this encounter: 88.5 kg. -Weight Loss and Dietary Counseling Given -Will need Nutritionist Evaluation when Diet is advanced.   CKD Stage 3 -Cr improved now; BUN/Cr went from 21/1.00 -> 17/1.10 -> 12/0.95 -> 13/0.86 -> 14/0.84 -BP fairly well controlled so will hold Lisinopril at this time; Avoid Hypotension -IVF Hydration now stopped  -Continue to monitor and Trend Renal Fxn -Repeat CMP in AM and since Cr improved resuming Lisinopril   Normocytic Anemia -Hgb/Hct went from 13.6/41.7 -> 11.6/37.7 -> 11.2/34.6 and was not checked again today  -Likely expected Post-operative Drop -Checked Anemia Panel with iron level of 33, U IBC of 2 7, TIBC of 240, saturation was 14%, ferritin level 101, folate of 19.2, and vitamin B12 of 302, -Continue to Monitor for S/Sx of Bleeding -Recommend Repeating CBC in AM  Leukocytosis -Patient's WBC went from 11.2 -> 17.6 -> 15.3 -> 11.1; Repeat Not done again today  -Trending down now after Surgery -Continue to Monitor and Trend WBC -Recommending Repeating CBC in AM   Hypophosphatemia -Patient's phosphorus level yesterday morning was 3.0 -Continue to monitor replete as necessary -Repeat Phos Level in a.m.  Asymptomatic Bradycardia -Currently not symptomatic -Will need to  watch carefully as patient is on Metoprolol with Holding parameters  -HR ranging from 56-78   DVT prophylaxis: Heparin 5,000 units sq q8h Code Status: FULL CODE Family Communication: No Family present at bedside  Disposition Plan: Per Primary Team and anticipating D/C in next 1-2 days after more Ostomy teaching and PT/OT  Consultants:  [facility] is Primary    Procedures:  1. Robotic assisted low anterior resection with end colostomy 2. Flexible sigmoidoscopy   Antimicrobials:  Anti-infectives (From admission, onward)   Start     Dose/Rate Route Frequency Ordered Stop   05/17/19 1400  neomycin (MYCIFRADIN) tablet 1,000 mg  Status:  Discontinued     1,000 mg Oral 3 times per day 05/17/19 0842 05/17/19 0849   05/17/19 1400  metroNIDAZOLE (FLAGYL) tablet 1,000 mg  Status:  Discontinued     1,000 mg Oral 3 times per day 05/17/19 0842 05/17/19 0849   05/17/19 0900  cefoTEtan (CEFOTAN) 2 g in sodium chloride 0.9 % 100 mL IVPB     2 g 200 mL/hr over 30 Minutes Intravenous On call to O.R. 05/17/19 0845 05/17/19 1303   05/17/19 0845  cefoTEtan (CEFOTAN) 2 g in sodium chloride 0.9 % 100 mL IVPB  Status:  Discontinued     2 g 200 mL/hr over 30 Minutes Intravenous On call to O.R. 05/17/19 4128 05/17/19 7867  05/17/19 0845  cefoTEtan (CEFOTAN) 2 g in sodium chloride 0.9 % 100 mL IVPB  Status:  Discontinued     2 g 200 mL/hr over 30 Minutes Intravenous On call to O.R. 05/17/19 7829 05/17/19 0843     Subjective: Seen and examined at bedside and had no real complaints except that she has abdominal soreness and pain when her she moves or turns.  No chest pain, lightheadedness or dizziness.  No other concerns complaints at this time and is feeling well.  States that General Surgery is planning on discharging her Tuesday because she needs to work with physical therapy to work on stairs and needs more teaching.  Objective: Vitals:   05/20/19 1328 05/20/19 2044 05/21/19 0500 05/21/19  0533  BP: (!) 102/52 (!) 150/72  (!) 142/60  Pulse: (!) 56 (!) 58  65  Resp: 14 18  20   Temp: 98.2 F (36.8 C) 98.7 F (37.1 C)  98.5 F (36.9 C)  TempSrc: Oral Oral  Oral  SpO2: 97% 94%  91%  Weight:   88.5 kg   Height:        Intake/Output Summary (Last 24 hours) at 05/21/2019 1302 Last data filed at 05/21/2019 0540 Gross per 24 hour  Intake 420 ml  Output 600 ml  Net -180 ml   Filed Weights   05/19/19 0542 05/20/19 0518 05/21/19 0500  Weight: 88.9 kg 88.9 kg 88.5 kg   Examination: Physical Exam:  Constitutional: Well-nourished, well-developed obese Caucasian female currently no acute distress appears calm and comfortable doing her crossword puzzles Respiratory: Clear to auscultation bilaterally diffuse wheezing as well as rhonchi.  Patient is not tachypneic or using any accessory muscles to breathe Cardiovascular: Regular rate and rhythm.  No appreciable murmurs Abdomen: Soft, still mildly tender.  Distended secondary body habitus.  Bowel sounds present and has abdominal incisions that are very clean dry and intact.  Has a left lower quadrant colostomy with ostomy pouch having air along with stool GU: Deferred Neurologic: Cranial nerves II through XII grossly intact no appreciable focal deficits except that she is somewhat hard of hearing Psychiatric: Pleasant mood and affect.  Intact judgment and insight.  Data Reviewed: I have personally reviewed following labs and imaging studies  CBC: Recent Labs  Lab 05/17/19 1929 05/18/19 0014 05/19/19 0603  WBC 17.6* 15.3* 11.1*  NEUTROABS 14.1* 10.5* 5.8  HGB 12.2 11.6* 11.2*  HCT 39.3 37.7 34.6*  MCV 95.2 95.4 95.1  PLT 236 224 562   Basic Metabolic Panel: Recent Labs  Lab 05/17/19 1929 05/18/19 0014 05/19/19 0603 05/20/19 0523 05/21/19 0456  NA 137 136 138 139 138  K 4.1 4.7 4.1 3.6 3.6  CL 105 105 108 107 107  CO2 25 25 24 25 25   GLUCOSE 192* 140* 105* 115* 118*  BUN 19 17 12 13 14   CREATININE 1.04* 1.10*  0.95 0.86 0.84  CALCIUM 8.8* 8.5* 8.6* 8.5* 8.4*  MG 2.1 2.0 2.1 1.9  --   PHOS 3.7 3.9 2.1* 3.0  --    GFR: Estimated Creatinine Clearance: 50.8 mL/min (by C-G formula based on SCr of 0.84 mg/dL). Liver Function Tests: Recent Labs  Lab 05/17/19 1929 05/18/19 0014 05/19/19 0603  AST 19 18 20   ALT 11 12 11   ALKPHOS 39 37* 34*  BILITOT 0.9 0.8 0.8  PROT 6.6 5.9* 6.1*  ALBUMIN 3.7 3.2* 3.2*   No results for input(s): LIPASE, AMYLASE in the last 168 hours. No results for input(s): AMMONIA in the  last 168 hours. Coagulation Profile: No results for input(s): INR, PROTIME in the last 168 hours. Cardiac Enzymes: Recent Labs  Lab 05/17/19 1929 05/18/19 0014 05/18/19 0804  TROPONINI <0.03 <0.03 <0.03   BNP (last 3 results) No results for input(s): PROBNP in the last 8760 hours. HbA1C: No results for input(s): HGBA1C in the last 72 hours. CBG: Recent Labs  Lab 05/20/19 1203 05/20/19 1658 05/20/19 2039 05/21/19 0742 05/21/19 1200  GLUCAP 169* 119* 162* 120* 157*   Lipid Profile: No results for input(s): CHOL, HDL, LDLCALC, TRIG, CHOLHDL, LDLDIRECT in the last 72 hours. Thyroid Function Tests: No results for input(s): TSH, T4TOTAL, FREET4, T3FREE, THYROIDAB in the last 72 hours. Anemia Panel: Recent Labs    05/19/19 0603  VITAMINB12 302  FOLATE 19.2  FERRITIN 101  TIBC 240*  IRON 33  RETICCTPCT 1.1   Sepsis Labs: No results for input(s): PROCALCITON, LATICACIDVEN in the last 168 hours.  Recent Results (from the past 240 hour(s))  MRSA PCR Screening     Status: None   Collection Time: 05/17/19  6:50 PM  Result Value Ref Range Status   MRSA by PCR NEGATIVE NEGATIVE Final    Comment:        The GeneXpert MRSA Assay (FDA approved for NASAL specimens only), is one component of a comprehensive MRSA colonization surveillance program. It is not intended to diagnose MRSA infection nor to guide or monitor treatment for MRSA infections. Performed at [facility], [place] [address], [place], [place] [address]     Radiology Studies: No results found.  Scheduled Meds: . acetaminophen  650 mg Oral Q6H  . aspirin  81 mg Oral Daily  . Chlorhexidine Gluconate Cloth  6 each Topical Daily  . feeding supplement  237 mL Oral BID BM  . heparin injection (subcutaneous)  5,000 Units Subcutaneous Q8H  . insulin aspart  0-5 Units Subcutaneous QHS  . insulin aspart  0-9 Units Subcutaneous TID WC  . lisinopril  20 mg Oral Daily  . mouth rinse  15 mL Mouth Rinse BID  . metoprolol succinate  100 mg Oral Daily  . pravastatin  20 mg Oral q1800   Continuous Infusions:   LOS: 4 days   Kerney Elbe, DO [facility] PAGER is on [place]  If 7PM-7AM, please contact night-coverage [email or web address] Password [email or web address] 05/21/2019, 1:02 PM

## 2019-05-21 NOTE — Progress Notes (Signed)
Physical Therapy Treatment Patient Details Name: Heidi Whitaker MRN: [id number] DOB: February 21, 1929 Today's Date: 05/21/2019    History of Present Illness s/p colostomy due to rectal mass.  PMH:  HTN and DM    PT Comments    Progressing with mobility. Focused on stair training this session. Min assist to steady. Pt was fatigued at end of session.    Follow Up Recommendations  Home health PT;Supervision/Assistance - 24 hour     Equipment Recommendations  None recommended by PT    Recommendations for Other Services       Precautions / Restrictions Precautions Precautions: Fall Precaution Comments: recent ABD surgery/new Colostomy  Restrictions Weight Bearing Restrictions: No    Mobility  Bed Mobility               General bed mobility comments: oob in recliner  Transfers Overall transfer level: Needs assistance Equipment used: Rolling walker (2 wheeled) Transfers: Sit to/from Stand Sit to Stand: Min assist         General transfer comment: Small amount of assist to power up. VCs safety, technique, hand placement.   Ambulation/Gait Ambulation/Gait assistance: Min guard Gait Distance (Feet): 15 Feet Assistive device: Rolling walker (2 wheeled) Gait Pattern/deviations: Step-through pattern;Decreased stride length     General Gait Details: short walk recliner<>stairwell.   Stairs Stairs: Yes Stairs assistance: Min assist Stair Management: Step to pattern;Forwards;One rail Left Number of Stairs: 10 General stair comments: Assist to steady while going up and down stairs. Standing rest break needed between ascending and descending.    Wheelchair Mobility    Modified Rankin (Stroke Patients Only)       Balance Overall balance assessment: Needs assistance         Standing balance support: Bilateral upper extremity supported Standing balance-Leahy Scale: Poor                              Cognition Arousal/Alertness:  Awake/alert Behavior During Therapy: WFL for tasks assessed/performed Overall Cognitive Status: Within Functional Limits for tasks assessed                                 General Comments: pleasant and sharp      Exercises      General Comments        Pertinent Vitals/Pain Pain Assessment: Faces Faces Pain Scale: Hurts little more Pain Location: abdomen with movement Pain Descriptors / Indicators: Sore;Grimacing Pain Intervention(s): Monitored during session;Repositioned    Home Living                      Prior Function            PT Goals (current goals can now be found in the care plan section) Progress towards PT goals: Progressing toward goals    Frequency    Min 3X/week      PT Plan Current plan remains appropriate    Co-evaluation              AM-PAC PT "6 Clicks" Mobility   Outcome Measure  Help needed turning from your back to your side while in a flat bed without using bedrails?: A Lot Help needed moving from lying on your back to sitting on the side of a flat bed without using bedrails?: A Lot Help needed moving to and from a bed to a chair (including  a wheelchair)?: A Little Help needed standing up from a chair using your arms (e.g., wheelchair or bedside chair)?: A Little Help needed to walk in hospital room?: A Little Help needed climbing 3-5 steps with a railing? : A Little 6 Click Score: 16    End of Session Equipment Utilized During Treatment: Gait belt Activity Tolerance: Patient tolerated treatment well(fatigued after stair training) Patient left: in chair;with call bell/phone within reach;with chair alarm set   PT Visit Diagnosis: Unsteadiness on feet (R26.81);Muscle weakness (generalized) (M62.81);Pain;Difficulty in walking, not elsewhere classified (R26.2) Pain - part of body: (abdomen)     Time: 7255-0016 PT Time Calculation (min) (ACUTE ONLY): 17 min  Charges:  $Gait Training: 8-22 mins                         Weston Anna, [place] Pager: [phone] Office: [phone]

## 2019-05-22 DIAGNOSIS — I4891 Unspecified atrial fibrillation: Secondary | ICD-10-CM | POA: Diagnosis not present

## 2019-05-22 DIAGNOSIS — D72829 Elevated white blood cell count, unspecified: Secondary | ICD-10-CM | POA: Diagnosis not present

## 2019-05-22 DIAGNOSIS — K6289 Other specified diseases of anus and rectum: Secondary | ICD-10-CM | POA: Diagnosis not present

## 2019-05-22 DIAGNOSIS — I10 Essential (primary) hypertension: Secondary | ICD-10-CM | POA: Diagnosis not present

## 2019-05-22 DIAGNOSIS — E785 Hyperlipidemia, unspecified: Secondary | ICD-10-CM | POA: Diagnosis not present

## 2019-05-22 DIAGNOSIS — D649 Anemia, unspecified: Secondary | ICD-10-CM | POA: Diagnosis not present

## 2019-05-22 DIAGNOSIS — N183 Chronic kidney disease, stage 3 (moderate): Secondary | ICD-10-CM | POA: Diagnosis not present

## 2019-05-22 DIAGNOSIS — E669 Obesity, unspecified: Secondary | ICD-10-CM | POA: Diagnosis not present

## 2019-05-22 DIAGNOSIS — E1169 Type 2 diabetes mellitus with other specified complication: Secondary | ICD-10-CM | POA: Diagnosis not present

## 2019-05-22 LAB — CBC WITH DIFFERENTIAL/PLATELET
Abs Immature Granulocytes: 0.03 10*3/uL (ref 0.00–0.07)
Basophils Absolute: 0 10*3/uL (ref 0.0–0.1)
Basophils Relative: 0 %
Eosinophils Absolute: 1.2 10*3/uL — ABNORMAL HIGH (ref 0.0–0.5)
Eosinophils Relative: 11 %
HCT: 34.3 % — ABNORMAL LOW (ref 36.0–46.0)
Hemoglobin: 11 g/dL — ABNORMAL LOW (ref 12.0–15.0)
Immature Granulocytes: 0 %
Lymphocytes Relative: 31 %
Lymphs Abs: 3.2 10*3/uL (ref 0.7–4.0)
MCH: 30.6 pg (ref 26.0–34.0)
MCHC: 32.1 g/dL (ref 30.0–36.0)
MCV: 95.3 fL (ref 80.0–100.0)
Monocytes Absolute: 1.2 10*3/uL — ABNORMAL HIGH (ref 0.1–1.0)
Monocytes Relative: 12 %
Neutro Abs: 4.8 10*3/uL (ref 1.7–7.7)
Neutrophils Relative %: 46 %
Platelets: 227 10*3/uL (ref 150–400)
RBC: 3.6 MIL/uL — ABNORMAL LOW (ref 3.87–5.11)
RDW: 13.4 % (ref 11.5–15.5)
WBC: 10.5 10*3/uL (ref 4.0–10.5)
nRBC: 0 % (ref 0.0–0.2)

## 2019-05-22 LAB — GLUCOSE, CAPILLARY
Glucose-Capillary: 113 mg/dL — ABNORMAL HIGH (ref 70–99)
Glucose-Capillary: 154 mg/dL — ABNORMAL HIGH (ref 70–99)
Glucose-Capillary: 146 mg/dL — ABNORMAL HIGH (ref 70–99)
Glucose-Capillary: 122 mg/dL — ABNORMAL HIGH (ref 70–99)

## 2019-05-22 LAB — BASIC METABOLIC PANEL
Anion gap: 9 (ref 5–15)
BUN: 15 mg/dL (ref 8–23)
CO2: 24 mmol/L (ref 22–32)
Calcium: 8.7 mg/dL — ABNORMAL LOW (ref 8.9–10.3)
Chloride: 107 mmol/L (ref 98–111)
Creatinine, Ser: 0.91 mg/dL (ref 0.44–1.00)
GFR calc Af Amer: >60 mL/min (ref >60)
GFR calc non Af Amer: 57 mL/min — ABNORMAL LOW (ref >60)
Glucose, Bld: 121 mg/dL — ABNORMAL HIGH (ref 70–99)
Potassium: 3.6 mmol/L (ref 3.5–5.1)
Sodium: 140 mmol/L (ref 135–145)

## 2019-05-22 LAB — PHOSPHORUS: Phosphorus: 2.8 mg/dL (ref 2.5–4.6)

## 2019-05-22 LAB — MAGNESIUM: Magnesium: 2 mg/dL (ref 1.7–2.4)

## 2019-05-22 NOTE — TOC Progression Note (Signed)
Transition of Care [facility]) - Progression Note    Patient Details  Name: Heidi Whitaker MRN: [id number] Date of Birth: 23-Dec-1929  [facility] CM/SW Contact  Cj Edgell, Juliann Pulse, RN Phone Number: 05/22/2019, 1:00 PM  Clinical Narrative:  Faxed to office 3n1 order for patient to [facility] for d/c tomorrow.     Expected Discharge Plan: [place] Barriers to Discharge: Continued Medical Work up  Expected Discharge Plan and Services Expected Discharge Plan: [place]   Discharge Planning Services: CM Consult   Living arrangements for the past 2 months: Single Family Home                 DME Arranged: 3-N-1 DME Agency: [facility] Date DME Agency Contacted: 05/22/19 Time DME Agency Contacted: 1300 Representative spoke with at DME Agency: faxed w/cofirmation to office [phone] HH Arranged: RN, PT, OT HH Agency: [place] Date [place]: 05/19/19 Time [place]: 9147 Representative spoke with at [place]: [place] ([place]) Interventions    Readmission Risk Interventions No flowsheet data found.

## 2019-05-22 NOTE — Progress Notes (Signed)
Physical Therapy Treatment Patient Details Name: Heidi Whitaker MRN: [id number] DOB: 01/26/1929 Today's Date: 05/22/2019    History of Present Illness Pt is an 85 year old s/p colostomy due to rectal mass.  PMH:  HTN and DM    PT Comments    Pt ambulated in hallway and tolerated a good distance.  Pt left up in recliner end of session.  Pt reports she would like to practice stairs once more prior to d/c.    Follow Up Recommendations  Home health PT;Supervision/Assistance - 24 hour     Equipment Recommendations  None recommended by PT    Recommendations for Other Services       Precautions / Restrictions Precautions Precautions: Fall Precaution Comments: recent ABD surgery/new Colostomy  Restrictions Weight Bearing Restrictions: No    Mobility  Bed Mobility               General bed mobility comments: pt OOB in recliner with nursing  Transfers Overall transfer level: Needs assistance Equipment used: Rolling walker (2 wheeled) Transfers: Sit to/from Stand Sit to Stand: Min guard         General transfer comment: min/guard for safety, cues for hand placement  Ambulation/Gait Ambulation/Gait assistance: Min guard Gait Distance (Feet): 200 Feet Assistive device: Rolling walker (2 wheeled) Gait Pattern/deviations: Step-through pattern;Decreased stride length Gait velocity: decr   General Gait Details: verbal cues for RW positioning and posture, tolerated distance well   Stairs             Wheelchair Mobility    Modified Rankin (Stroke Patients Only)       Balance                                            Cognition Arousal/Alertness: Awake/alert Behavior During Therapy: WFL for tasks assessed/performed Overall Cognitive Status: Within Functional Limits for tasks assessed                                        Exercises      General Comments        Pertinent Vitals/Pain Pain Assessment:  Faces Faces Pain Scale: Hurts a little bit Pain Location: abdomen with movement Pain Descriptors / Indicators: Tender;Sore Pain Intervention(s): Monitored during session;Limited activity within patient's tolerance    Home Living                      Prior Function            PT Goals (current goals can now be found in the care plan section) Progress towards PT goals: Progressing toward goals    Frequency    Min 3X/week      PT Plan Current plan remains appropriate    Co-evaluation              AM-PAC PT "6 Clicks" Mobility   Outcome Measure  Help needed turning from your back to your side while in a flat bed without using bedrails?: A Lot Help needed moving from lying on your back to sitting on the side of a flat bed without using bedrails?: A Lot Help needed moving to and from a bed to a chair (including a wheelchair)?: A Little Help needed standing up from a chair using your  arms (e.g., wheelchair or bedside chair)?: A Little Help needed to walk in hospital room?: A Little Help needed climbing 3-5 steps with a railing? : A Little 6 Click Score: 16    End of Session Equipment Utilized During Treatment: Gait belt Activity Tolerance: Patient tolerated treatment well Patient left: in chair;with call bell/phone within reach;with chair alarm set Nurse Communication: Mobility status PT Visit Diagnosis: Unsteadiness on feet (R26.81);Muscle weakness (generalized) (M62.81);Difficulty in walking, not elsewhere classified (R26.2)     Time: 5956-3875 PT Time Calculation (min) (ACUTE ONLY): 14 min  Charges:  $Gait Training: 8-22 mins                    Carmelia Bake, PT, DPT Acute Rehabilitation Services Office: [phone] Pager: [phone]  Trena Platt 05/22/2019, 1:45 PM

## 2019-05-22 NOTE — Progress Notes (Signed)
FINAL CONSULT PROGRESS NOTE    DONYEA Whitaker  NLG:[id number] DOB: 1929-12-06 DOA: 05/17/2019 PCP: McLean-Scocuzza, Nino Glow, MD  Brief Narrative:  Heidi Whitaker is an 85 y.o. female with a past medical history significant for but not limited to hypertension, hyperlipidemia, diabetes mellitus type 2 on oral hypoglycemics, along with obesity and chronic diarrhea who presented for an elective surgical procedure today.  Patient has been having chronic diarrhea for the past couple years and had a history of a positive Cologuard test in 2017 and underwent a colonoscopy earlier in January which was notable for an endoscopic clearly unresectable polyp in the cecum and endoscopic unresectable polyp in the rectum which was frond-like/villous and polypoid.  She underwent CT scans of the abdomen pelvis did not visualize lesion seen her colon and rectum.  She denied any abdominal pain, nausea or vomiting besides having chronic diarrhea and stated that her stools been mucousy in the past couple years.  She underwent cardiac clearance and underwent MRI pelvis for rectal polypoid mass and this demonstrated findings of probable invasion of the muscularis propria and was read as a rectosigmoid T3N 0/1 mass.  She presented to the general surgeon's office and the consensus was to proceed with a low anterior resection and not treated with neoadjuvant therapy.  She underwent a robotic assisted low anterior resection with a end colostomy on and was seen in the PACU at the request of Dr. Dema Severin for medical management for comorbid conditions.  After she had surgical intervention she underwent transient atrial fibrillation with RVR which was abated by a dose of IV metoprolol.  She went to the stepdown unit in stable condition.  Currently POD5 and was transferred to Medical Floor.  Diet is being advanced and she is improving.  PT OT evaluating and recommending home health.  Foley catheter discontinued today and patient will  have a colostomy teaching by the [facility] Nurse. Diet advanced to Soft patient is tolerating quite well  Patient is medically stable at this time and internal medicine will sign off.  Please do not hesitate to reconsult [place] if any further questions arise or if assistance is needed and medically managing this patient.  Assessment & Plan:   Active Problems:   Rectal mass   Diabetes mellitus type 2 in obese [facility])  Rectal Mass s/p Robotic LOA with End Colostomy -POD5 -Further Care per General Surgery including Pain Control with Oxyxodone and Fentanyl  -Diet Advancement per General Surgery and remains on Soft -WOC nurse consulted; general surgery feels that she needs more ostomy teaching and PT OT prior to discharge -DVT Prophylaxis per Surgery  -PT/OT recommending Home Health and anticipate D/C Home in the next few days when cleared by General Surgery. -Patient is Medically Stable and will Sign Off Case. Further Disposition and Discharge planning per General Surgery   Hypertension -Blood Pressure was 146/67 -Antihypertensives had been held but have now been resumed  -Was Holding Lisinopril again today due to slight bump in Cr but now that Cr is improved resumed Lisinopril 20 mg po Daily  -Metoprolol 100 mg po Daily to be resumed this AM per General Surgery -Continue to Monitor BP carefully  HLD -Agree with Resuming Lovastatin substitution with Pravastatin 20 mg po Daily  Diabetes Mellitus Type 2 -Currently holding Glimperide 1 tab (2 mg po Daily) -Agree with Starting on Sensitive Novolog SSI AC/HS -CBG's currently ranging from 113-157 -Continue to Monitor and Trend CBG's; This AM Blood Glucose was 121 on BMP  Transient A Fib with RVR now in NSR -Converted to NSR after given a dose of IV Metoprolol -Continue to Monitor on Telemetry -Likely related to Surgical Intervention -CHA2DS2-VASc of 5 (Age, Sex, HTN, Diabetes) so is at a high risk for CVA -Since it was related to Surgical  intervention and short-lived will hold off on Starting Anticoagulation currently, especially since she just had surgery -IF patient goes back into Atrial Fibrillation will need Anticoagulation -Cycle Cardiac Troponin's; Troponin was <0.03 x2  -Will need to review ECHO done for Pre-operative Clearance -Will need to Request Records from [place] showed LVEF of 66%; Read as intermediate to Low risk Scan as there was borderline normal myocardial perfusion as there was a small area of apical defect possibly apical thinning and no clear evidence of ischemia  -Will hold off on Anticoagulation currently and patient will need to follow up with Cardiology as an outpatient for further workup -Resumed Home Metoprolol yesterday AM  -If goes back into Atrial Fibrillation here will need Inpatient ECHO and Cardiac Consultation.  -Recommending Follow up with Cardiology in the Outpatient setting after D/C  Obesity -Estimated body mass index is 33.47 kg/m as calculated from the following:   Height as of this encounter: 5\' 4"  (1.626 m).   Weight as of this encounter: 88.5 kg. -Weight Loss and Dietary Counseling Given -Will need Nutritionist Evaluation when Diet is advanced.   CKD Stage 3 -Cr improved now; BUN/Cr went from 21/1.00 -> 17/1.10 -> 12/0.95 -> 13/0.86 -> 14/0.84 -> 15/0.91 -BP fairly well controlled so will hold Lisinopril at this time; Avoid Hypotension -IVF Hydration now stopped  -Continue to monitor and Trend Renal Fxn -Repeat CMP as an outpatient   Normocytic Anemia -Hgb/Hct went from 13.6/41.7 -> 11.6/37.7 -> 11.2/34.6 -> 11.0/34.3 -Likely expected Post-operative Drop -Checked Anemia Panel with iron level of 33, U IBC of 2 7, TIBC of 240, saturation was 14%, ferritin level 101, folate of 19.2, and vitamin B12 of 302, -Continue to Monitor for S/Sx of Bleeding -Recommend Repeating CBC in AM  Leukocytosis -Patient's WBC went from 11.2 -> 17.6 -> 15.3 -> 11.1  -> 10.5 -Trending down now after Surgery -Continue to Monitor and Trend WBC -Recommending Repeating CBC as an outpatient   Hypophosphatemia -Patient's phosphorus level yesterday morning was 2.0 -Continue to monitor replete as necessary -Recommend repeating Phos Level as an outpatient   Asymptomatic Bradycardia -Currently not symptomatic -Will need to watch carefully as patient is on Metoprolol with Holding parameters  -HR ranging from 55-56 -Continue to Monitor Closely   DVT prophylaxis: Heparin 5,000 units sq q8h Code Status: FULL CODE Family Communication: No Family present at bedside  Disposition Plan: Per Primary Team and anticipating D/C in next 1-2 days after more Ostomy teaching and PT/OT; Internal Medicine will sign off the case  Consultants:  TRH (SIGN OFF 05/22/2019) General Surgery is Primary    Procedures:  1. Robotic assisted low anterior resection with end colostomy 2. Flexible sigmoidoscopy   Antimicrobials:  Anti-infectives (From admission, onward)   Start     Dose/Rate Route Frequency Ordered Stop   05/17/19 1400  neomycin (MYCIFRADIN) tablet 1,000 mg  Status:  Discontinued     1,000 mg Oral 3 times per day 05/17/19 0842 05/17/19 0849   05/17/19 1400  metroNIDAZOLE (FLAGYL) tablet 1,000 mg  Status:  Discontinued     1,000 mg Oral 3 times per day 05/17/19 0842 05/17/19 0849   05/17/19 0900  cefoTEtan (CEFOTAN) 2  g in sodium chloride 0.9 % 100 mL IVPB     2 g 200 mL/hr over 30 Minutes Intravenous On call to O.R. 05/17/19 0845 05/17/19 1303   05/17/19 0845  cefoTEtan (CEFOTAN) 2 g in sodium chloride 0.9 % 100 mL IVPB  Status:  Discontinued     2 g 200 mL/hr over 30 Minutes Intravenous On call to O.R. 05/17/19 1478 05/17/19 0843   05/17/19 0845  cefoTEtan (CEFOTAN) 2 g in sodium chloride 0.9 % 100 mL IVPB  Status:  Discontinued     2 g 200 mL/hr over 30 Minutes Intravenous On call to O.R. 05/17/19 2956 05/17/19 0843     Subjective: Seen and examined at  bedside and had no complaints but states that she is only sore when she moves.  Denies chest pain, lightheadedness or dizziness.  Wants to work on ambulating more and walking up and down stairs.  States that she also does not know really how to change her colostomy bag yet but she has been shown a couple times but she thinks she needs more practice.  No other concerns complaints at this time.  Objective: Vitals:   05/21/19 0533 05/21/19 1343 05/21/19 2038 05/22/19 0629  BP: (!) 142/60 114/86 (!) 141/68 (!) 146/67  Pulse: 65 (!) 55 (!) 55 (!) 56  Resp: 20 18  18   Temp: 98.5 F (36.9 C) 98.5 F (36.9 C) 98 F (36.7 C) 98.1 F (36.7 C)  TempSrc: Oral Oral Oral Oral  SpO2: 91% 93% 98% 91%  Weight:      Height:        Intake/Output Summary (Last 24 hours) at 05/22/2019 1128 Last data filed at 05/22/2019 0900 Gross per 24 hour  Intake 240 ml  Output 450 ml  Net -210 ml   Filed Weights   05/19/19 0542 05/20/19 0518 05/21/19 0500  Weight: 88.9 kg 88.9 kg 88.5 kg   Examination: Physical Exam:  Constitutional: Well-nourished, well-developed obese Caucasian female currently no acute distress Respiratory: Clear to auscultation bilaterally with no appreciable wheezing, rales, rhonchi.  Patient not tachypneic wheezing accessory muscle breathe Cardiovascular: Regular rate and rhythm but slightly on the slower side.  No appreciable murmurs noted Abdomen: Soft, mildly tender.  Distended secondary body habitus.  Bowel sounds present and has a abdominal incisions that are clean, dry, intact.  Has a left lower quadrant colostomy with area of brownish stool GU: Deferred Neurologic: Cranial nerves II through XII gross intact no appreciable focal deficits.  She is still somewhat hard of hearing Psychiatric: Pleasant mood and affect.  Intact judgment insight   Data Reviewed: I have personally reviewed following labs and imaging studies  CBC: Recent Labs  Lab 05/17/19 1929 05/18/19 0014 05/19/19  0603 05/22/19 0523  WBC 17.6* 15.3* 11.1* 10.5  NEUTROABS 14.1* 10.5* 5.8 4.8  HGB 12.2 11.6* 11.2* 11.0*  HCT 39.3 37.7 34.6* 34.3*  MCV 95.2 95.4 95.1 95.3  PLT 236 224 195 213   Basic Metabolic Panel: Recent Labs  Lab 05/17/19 1929 05/18/19 0014 05/19/19 0603 05/20/19 0523 05/21/19 0456 05/22/19 0523  NA 137 136 138 139 138 140  K 4.1 4.7 4.1 3.6 3.6 3.6  CL 105 105 108 107 107 107  CO2 25 25 24 25 25 24   GLUCOSE 192* 140* 105* 115* 118* 121*  BUN 19 17 12 13 14 15   CREATININE 1.04* 1.10* 0.95 0.86 0.84 0.91  CALCIUM 8.8* 8.5* 8.6* 8.5* 8.4* 8.7*  MG 2.1 2.0 2.1 1.9  --  2.0  PHOS 3.7 3.9 2.1* 3.0  --  2.8   GFR: Estimated Creatinine Clearance: 46.9 mL/min (by C-G formula based on SCr of 0.91 mg/dL). Liver Function Tests: Recent Labs  Lab 05/17/19 1929 05/18/19 0014 05/19/19 0603  AST 19 18 20   ALT 11 12 11   ALKPHOS 39 37* 34*  BILITOT 0.9 0.8 0.8  PROT 6.6 5.9* 6.1*  ALBUMIN 3.7 3.2* 3.2*   No results for input(s): LIPASE, AMYLASE in the last 168 hours. No results for input(s): AMMONIA in the last 168 hours. Coagulation Profile: No results for input(s): INR, PROTIME in the last 168 hours. Cardiac Enzymes: Recent Labs  Lab 05/17/19 1929 05/18/19 0014 05/18/19 0804  TROPONINI <0.03 <0.03 <0.03   BNP (last 3 results) No results for input(s): PROBNP in the last 8760 hours. HbA1C: No results for input(s): HGBA1C in the last 72 hours. CBG: Recent Labs  Lab 05/21/19 0742 05/21/19 1200 05/21/19 1622 05/21/19 2040 05/22/19 0727  GLUCAP 120* 157* 152* 117* 113*   Lipid Profile: No results for input(s): CHOL, HDL, LDLCALC, TRIG, CHOLHDL, LDLDIRECT in the last 72 hours. Thyroid Function Tests: No results for input(s): TSH, T4TOTAL, FREET4, T3FREE, THYROIDAB in the last 72 hours. Anemia Panel: No results for input(s): VITAMINB12, FOLATE, FERRITIN, TIBC, IRON, RETICCTPCT in the last 72 hours. Sepsis Labs: No results for input(s): PROCALCITON,  LATICACIDVEN in the last 168 hours.  Recent Results (from the past 240 hour(s))  MRSA PCR Screening     Status: None   Collection Time: 05/17/19  6:50 PM  Result Value Ref Range Status   MRSA by PCR NEGATIVE NEGATIVE Final    Comment:        The GeneXpert MRSA Assay (FDA approved for NASAL specimens only), is one component of a comprehensive MRSA colonization surveillance program. It is not intended to diagnose MRSA infection nor to guide or monitor treatment for MRSA infections. Performed at [facility], [place] [address], [place], [place] [address]     Radiology Studies: No results found.  Scheduled Meds: . acetaminophen  650 mg Oral Q6H  . aspirin  81 mg Oral Daily  . Chlorhexidine Gluconate Cloth  6 each Topical Daily  . feeding supplement  237 mL Oral BID BM  . heparin injection (subcutaneous)  5,000 Units Subcutaneous Q8H  . insulin aspart  0-5 Units Subcutaneous QHS  . insulin aspart  0-9 Units Subcutaneous TID WC  . lisinopril  20 mg Oral Daily  . mouth rinse  15 mL Mouth Rinse BID  . metoprolol succinate  100 mg Oral Daily  . pravastatin  20 mg Oral q1800   Continuous Infusions:   LOS: 5 days   Kerney Elbe, DO [facility] PAGER is on [place]  If 7PM-7AM, please contact night-coverage [email or web address] Password [facility] 05/22/2019, 11:28 AM

## 2019-05-22 NOTE — Plan of Care (Signed)
  Problem: Pain Managment: Goal: General experience of comfort will improve Outcome: Completed/Met

## 2019-05-22 NOTE — Progress Notes (Signed)
Occupational Therapy Treatment Patient Details Name: SIMMONE CAPE MRN: [id number] DOB: 09/01/1929 Today's Date: 05/22/2019    History of present illness Pt is an 85 year old s/p colostomy due to rectal mass.  PMH:  HTN and DM   OT comments  Fatigued quickly  Follow Up Recommendations  Home health OT;Supervision/Assistance - 24 hour    Equipment Recommendations  3 in 1 bedside commode(tba has toilet riser)    Recommendations for Other Services      Precautions / Restrictions Precautions Precautions: Fall Precaution Comments: recent ABD surgery/new Colostomy  Restrictions Weight Bearing Restrictions: No       Mobility Bed Mobility               General bed mobility comments: pt OOB in recliner with nursing  Transfers Overall transfer level: Needs assistance Equipment used: Rolling walker (2 wheeled) Transfers: Sit to/from Stand Sit to Stand: Mod assist         General transfer comment: cues for hand placement        ADL either performed or assessed with clinical judgement   ADL Overall ADL's : Needs assistance/impaired Eating/Feeding: Independent   Grooming: Set up;Sitting                       Toileting- Clothing Manipulation and Hygiene: Moderate assistance;Sit to/from stand;Cueing for sequencing;Cueing for safety         General ADL Comments: pt fatigued this OT session               Cognition Arousal/Alertness: Awake/alert Behavior During Therapy: WFL for tasks assessed/performed Overall Cognitive Status: Within Functional Limits for tasks assessed                                                     Pertinent Vitals/ Pain       Pain Assessment: Faces Faces Pain Scale: Hurts little more Pain Location: abdomen with movement Pain Descriptors / Indicators: Tender;Sore Pain Intervention(s): Repositioned;Monitored during session     Prior Functioning/Environment              Frequency  Min  2X/week        Progress Toward Goals  OT Goals(current goals can now be found in the care plan section)  Progress towards OT goals: Progressing toward goals     Plan Discharge plan remains appropriate       AM-PAC OT "6 Clicks" Daily Activity     Outcome Measure   Help from another person eating meals?: None Help from another person taking care of personal grooming?: A Little Help from another person toileting, which includes using toliet, bedpan, or urinal?: A Lot Help from another person bathing (including washing, rinsing, drying)?: A Lot Help from another person to put on and taking off regular upper body clothing?: A Little Help from another person to put on and taking off regular lower body clothing?: A Lot 6 Click Score: 16    End of Session Equipment Utilized During Treatment: Rolling walker  OT Visit Diagnosis: Muscle weakness (generalized) (M62.81)   Activity Tolerance Patient limited by fatigue   Patient Left in chair;with call bell/phone within reach;with chair alarm set   Nurse Communication Mobility status        Time: 3662-9476 OT Time Calculation (min): 12 min  Charges: OT  General Charges $OT Visit: 1 Visit OT Treatments $Self Care/Home Management : 8-22 mins  Kari Baars, [place] [phone] Office- [phone], Edwena Felty D 05/22/2019, 2:09 PM

## 2019-05-22 NOTE — Consult Note (Signed)
[place] Nurse ostomy follow up Patient receiving care in [place].  Her daughter is not available today, however, did receive teaching session last Friday with Julien Girt. Stoma type/location: RUQ colostomy Stomal assessment/size: red, moist, 1.5 inches top to bottom, 1 3/4 inches side to side. Sits down in a crease.  Peristomal assessment: Intact  Treatment options for stomal/peristomal skin: barrier rings Output: soft pudding consistency brown stool  Ostomy pouching: 2pc. Flat 2 3/4 inches with barrier rings.  However, in the future she may need convexity and a barrier ring. Education provided: Patient emptied her pouch, cleaned the tail, closed the pouch, then removed the entire pouching system with minimal assistance.  She observed new pouch placement and asked appropriate questions.  She did not have a device with which to record the teaching session.  The [place] nurses will be working remotely tomorrow.  I am providing her with [employer] and an educational booklet for her and her daughter. Enrolled patient in [place] Start Discharge program: Yes previously by D. Barbie Haggis. Additional pouching supplies of the convex type used on Friday of last week ordered for discharge purposes, along with barrier rings. Kellie Simmering # [id number], barrier rings Kellie Simmering [phone]. Val Riles, RN, MSN, CWOCN, CNS-BC, pager [phone]

## 2019-05-22 NOTE — Progress Notes (Signed)
5 Days Post-Op   Subjective/Chief Complaint: No complaints other than fatigue.  Worked with PT/OT yesterday   Objective: Vital signs in last 24 hours: Temp:  [98 F (36.7 C)-98.5 F (36.9 C)] 98.1 F (36.7 C) (05/25 0629) Pulse Rate:  [55-56] 56 (05/25 0629) Resp:  [18] 18 (05/25 0629) BP: (114-146)/(67-86) 146/67 (05/25 0629) SpO2:  [91 %-98 %] 91 % (05/25 0629) Last BM Date: 05/21/19  Intake/Output from previous day: 05/24 0701 - 05/25 0700 In: 360 [P.O.:360] Out: 300 [Urine:200; Stool:100] Intake/Output this shift: No intake/output data recorded.  General appearance: alert and cooperative Resp: clear to auscultation bilaterally Cardio: regular rate and rhythm GI: soft, mild tenderness. ostomy pink and productive  Lab Results:  Recent Labs    05/22/19 0523  WBC 10.5  HGB 11.0*  HCT 34.3*  PLT 227   BMET Recent Labs    05/21/19 0456 05/22/19 0523  NA 138 140  K 3.6 3.6  CL 107 107  CO2 25 24  GLUCOSE 118* 121*  BUN 14 15  CREATININE 0.84 0.91  CALCIUM 8.4* 8.7*   PT/INR No results for input(s): LABPROT, INR in the last 72 hours. ABG No results for input(s): PHART, HCO3 in the last 72 hours.  Invalid input(s): PCO2, PO2  Studies/Results: No results found.  Anti-infectives: Anti-infectives (From admission, onward)   Start     Dose/Rate Route Frequency Ordered Stop   05/17/19 1400  neomycin (MYCIFRADIN) tablet 1,000 mg  Status:  Discontinued     1,000 mg Oral 3 times per day 05/17/19 0842 05/17/19 0849   05/17/19 1400  metroNIDAZOLE (FLAGYL) tablet 1,000 mg  Status:  Discontinued     1,000 mg Oral 3 times per day 05/17/19 3710 05/17/19 0849   05/17/19 0900  cefoTEtan (CEFOTAN) 2 g in sodium chloride 0.9 % 100 mL IVPB     2 g 200 mL/hr over 30 Minutes Intravenous On call to O.R. 05/17/19 0845 05/17/19 1303   05/17/19 0845  cefoTEtan (CEFOTAN) 2 g in sodium chloride 0.9 % 100 mL IVPB  Status:  Discontinued     2 g 200 mL/hr over 30 Minutes  Intravenous On call to O.R. 05/17/19 6269 05/17/19 0843   05/17/19 0845  cefoTEtan (CEFOTAN) 2 g in sodium chloride 0.9 % 100 mL IVPB  Status:  Discontinued     2 g 200 mL/hr over 30 Minutes Intravenous On call to O.R. 05/17/19 4854 05/17/19 0843      Assessment/Plan: s/p Procedure(s): XI ROBOTIC ASSISTED LOWER ANTERIOR RESECTION WITH END COLOSTOMY (N/A) FLEXIBLE SIGMOIDOSCOPY (N/A)  Ostomy teaching Patient Active Problem List   Diagnosis Date Noted  . Rectal mass 05/17/2019  . Insomnia 09/30/2018  . Abnormal gait 09/30/2018  . Bilateral impacted cerumen 09/30/2018  . Chronic diarrhea 09/30/2018  . Type 2 diabetes mellitus with stage 3 chronic kidney disease, without long-term current use of insulin ([place]) 09/30/2018  . Hyperlipidemia 09/30/2018  . Nosebleed 09/30/2018  . Does use hearing aid 09/30/2018  . Positive colorectal cancer screening using Cologuard test 08/12/2018  . HTN (hypertension) 08/12/2018   XI ROBOTIC ASSISTED LOWER ANTERIOR RESECTION WITH END COLOSTOMY FLEXIBLE SIGMOIDOSCOPY 05/17/2019  POD#5  -Recovering well; no issues with Afib/rvr since OR -Cont soft diet -WOCN following - apprecaite assistance.  Need teaching with pt's daughter prior to d/c -PT/OT - recommending home health PT/OT, 24 hr supervision -Medicine following for assistance in management of comorbidities - appreciate their assistance in her care -Home PT/OT and a skilled nurse requested (  new colostomy). Will need family to attend colostomy changing/teaching prior to discharge. -pt needs more ostomy teaching prior to d/c   LOS: 5 days    Rosario Adie [id number]/93/7342

## 2019-05-22 NOTE — Progress Notes (Signed)
Physical Therapy Treatment Patient Details Name: Heidi Whitaker MRN: [id number] DOB: 1929-01-17 Today's Date: 05/22/2019    History of Present Illness Pt is an 85 year old s/p colostomy due to rectal mass.  PMH:  HTN and DM    PT Comments    Pt ambulated in hallway and practiced stairs again this afternoon.  Pt min/guard for mobility at this time.  Pt reports her bedroom is upstairs but she plans to limit stair use and has her daughter run up/down for "things."  Pt anticipates d/c home hopefully tomorrow.   Follow Up Recommendations  Home health PT;Supervision/Assistance - 24 hour     Equipment Recommendations  None recommended by PT    Recommendations for Other Services       Precautions / Restrictions Precautions Precautions: Fall Precaution Comments: recent ABD surgery/new Colostomy  Restrictions Weight Bearing Restrictions: No    Mobility  Bed Mobility               General bed mobility comments: pt OOB in recliner   Transfers Overall transfer level: Needs assistance Equipment used: Rolling walker (2 wheeled) Transfers: Sit to/from Stand Sit to Stand: Min guard         General transfer comment: min/guard for safety  Ambulation/Gait Ambulation/Gait assistance: Min guard Gait Distance (Feet): 160 Feet Assistive device: Rolling walker (2 wheeled) Gait Pattern/deviations: Step-through pattern;Decreased stride length Gait velocity: decr   General Gait Details: verbal cues for RW positioning    Stairs Stairs: Yes Stairs assistance: Min guard Stair Management: Step to pattern;Forwards;One rail Left Number of Stairs: 10 General stair comments: min/guard for safety, no physical assist required, standing rest break required between ascending and descending   Wheelchair Mobility    Modified Rankin (Stroke Patients Only)       Balance                                            Cognition Arousal/Alertness:  Awake/alert Behavior During Therapy: WFL for tasks assessed/performed Overall Cognitive Status: Within Functional Limits for tasks assessed                                        Exercises      General Comments        Pertinent Vitals/Pain Pain Assessment: No/denies pain Faces Pain Scale: Hurts little more Pain Location: abdomen with movement Pain Descriptors / Indicators: Tender;Sore Pain Intervention(s): Repositioned;Monitored during session    Home Living                      Prior Function            PT Goals (current goals can now be found in the care plan section) Progress towards PT goals: Progressing toward goals    Frequency    Min 3X/week      PT Plan Current plan remains appropriate    Co-evaluation              AM-PAC PT "6 Clicks" Mobility   Outcome Measure  Help needed turning from your back to your side while in a flat bed without using bedrails?: A Little Help needed moving from lying on your back to sitting on the side of a flat bed without using bedrails?: A  Little Help needed moving to and from a bed to a chair (including a wheelchair)?: A Little Help needed standing up from a chair using your arms (e.g., wheelchair or bedside chair)?: A Little Help needed to walk in hospital room?: A Little Help needed climbing 3-5 steps with a railing? : A Little 6 Click Score: 18    End of Session Equipment Utilized During Treatment: Gait belt Activity Tolerance: Patient tolerated treatment well Patient left: in chair;with call bell/phone within reach Nurse Communication: Mobility status PT Visit Diagnosis: Unsteadiness on feet (R26.81);Muscle weakness (generalized) (M62.81);Difficulty in walking, not elsewhere classified (R26.2)     Time: 9037-9558 PT Time Calculation (min) (ACUTE ONLY): 12 min  Charges:  $Gait Training: 8-22 mins                    Carmelia Bake, PT, DPT Acute Rehabilitation Services Office:  [phone] Pager: [phone]  Trena Platt 05/22/2019, 4:19 PM

## 2019-05-23 DIAGNOSIS — K6289 Other specified diseases of anus and rectum: Secondary | ICD-10-CM | POA: Diagnosis not present

## 2019-05-23 LAB — GLUCOSE, CAPILLARY
Glucose-Capillary: 120 mg/dL — ABNORMAL HIGH (ref 70–99)
Glucose-Capillary: 124 mg/dL — ABNORMAL HIGH (ref 70–99)

## 2019-05-23 NOTE — Progress Notes (Signed)
Physical Therapy Treatment Patient Details Name: Heidi Whitaker MRN: [id number] DOB: November 03, 1929 Today's Date: 05/23/2019    History of Present Illness Pt is an 85 year old s/p colostomy due to rectal mass.  PMH:  HTN and DM    PT Comments    Pt ambulated in hallway and reports being ready for d/c home today.    Follow Up Recommendations  Home health PT;Supervision for mobility/OOB     Equipment Recommendations  None recommended by PT    Recommendations for Other Services       Precautions / Restrictions Precautions Precautions: Fall Precaution Comments: recent ABD surgery/new Colostomy  Restrictions Weight Bearing Restrictions: No    Mobility  Bed Mobility               General bed mobility comments: pt OOB in recliner   Transfers Overall transfer level: Needs assistance Equipment used: Rolling walker (2 wheeled) Transfers: Sit to/from Stand Sit to Stand: Supervision            Ambulation/Gait Ambulation/Gait assistance: Min guard;Supervision Gait Distance (Feet): 240 Feet Assistive device: Rolling walker (2 wheeled) Gait Pattern/deviations: Step-through pattern;Decreased stride length     General Gait Details: verbal cues for RW positioning, posture   Stairs             Wheelchair Mobility    Modified Rankin (Stroke Patients Only)       Balance                                            Cognition Arousal/Alertness: Awake/alert Behavior During Therapy: WFL for tasks assessed/performed Overall Cognitive Status: Within Functional Limits for tasks assessed                                        Exercises      General Comments        Pertinent Vitals/Pain Pain Assessment: No/denies pain    Home Living                      Prior Function            PT Goals (current goals can now be found in the care plan section) Progress towards PT goals: Progressing toward goals     Frequency    Min 3X/week      PT Plan Current plan remains appropriate    Co-evaluation              AM-PAC PT "6 Clicks" Mobility   Outcome Measure  Help needed turning from your back to your side while in a flat bed without using bedrails?: A Little Help needed moving from lying on your back to sitting on the side of a flat bed without using bedrails?: A Little Help needed moving to and from a bed to a chair (including a wheelchair)?: A Little Help needed standing up from a chair using your arms (e.g., wheelchair or bedside chair)?: A Little Help needed to walk in hospital room?: A Little Help needed climbing 3-5 steps with a railing? : A Little 6 Click Score: 18    End of Session   Activity Tolerance: Patient tolerated treatment well Patient left: in chair;with call bell/phone within reach Nurse Communication: Mobility status PT Visit Diagnosis:  Muscle weakness (generalized) (M62.81);Difficulty in walking, not elsewhere classified (R26.2)     Time: 7048-8891 PT Time Calculation (min) (ACUTE ONLY): 12 min  Charges:  $Gait Training: 8-22 mins                    Carmelia Bake, PT, DPT Acute Rehabilitation Services Office: [phone] Pager: [phone]  Heidi Whitaker 05/23/2019, 12:22 PM

## 2019-05-23 NOTE — TOC Transition Note (Signed)
Transition of Care [facility]) - CM/SW Discharge Note   Patient Details  Name: SANAIYAH KIRCHHOFF MRN: [id number] Date of Birth: 1928-12-30  [facility]) CM/SW Contact:  Dessa Phi, RN Phone Number: 05/23/2019, 10:09 AM   Clinical Narrative:  Spoke to dtr(on phone) & patient(HOH) about 3n1, & transportation-they have declined 3n1 since its an out of pocket cost, & dtr can get it on her own. They have declined PTAR-madw aware of risks & benefits of PTAR-but they they may receive a bill depending on the coverage through their insurance. Family will transport home on own.[facility] rep Malachy Mood aware of d/c today. No further CM needs.     Final next level of care: [place] Barriers to Discharge: Continued Medical Work up   Patient Goals and CMS Choice Patient states their goals for this hospitalization and ongoing recovery are:: go home CMS Medicare.gov Compare Post Acute Care list provided to:: Patient Choice offered to / list presented to : Patient  Discharge Placement                       Discharge Plan and Services   Discharge Planning Services: CM Consult            DME Arranged: 3-N-1 DME Agency: [employer] Date DME Agency Contacted: 05/22/19 Time DME Agency Contacted: 1300 Representative spoke with at DME Agency: faxed w/cofirmation to office [phone] HH Arranged: RN, PT, OT HH Agency: [place] Date [place]: 05/19/19 Time [place]: [phone] Representative spoke with at [place]: [place]) Interventions     Readmission Risk Interventions No flowsheet data found.

## 2019-05-23 NOTE — Discharge Summary (Signed)
Patient ID: Heidi Whitaker MRN: [id number] DOB/AGE: 05-Feb-1929 85 y.o.  Admit date: 05/17/2019 Discharge date: 05/23/2019  Discharge Diagnoses Patient Active Problem List   Diagnosis Date Noted  . Diabetes mellitus type 2 in obese ([place])   . Rectal mass 05/17/2019  . Insomnia 09/30/2018  . Abnormal gait 09/30/2018  . Bilateral impacted cerumen 09/30/2018  . Chronic diarrhea 09/30/2018  . Type 2 diabetes mellitus with stage 3 chronic kidney disease, without long-term current use of insulin ([place]) 09/30/2018  . Hyperlipidemia 09/30/2018  . Nosebleed 09/30/2018  . Does use hearing aid 09/30/2018  . Positive colorectal cancer screening using Cologuard test 08/12/2018  . HTN (hypertension) 08/12/2018    Consultants Internal medicine  Procedures Robot assisted low anterior resection with end colostomy (05/17/2019)  Hospital Course: She was taken to the OR on 05/17/2019 and underwent the above procedure. She did have a brief course of afib/rvr intraoperatively which resolved with lopressor. Postoperatively she was admitted to stepdown for close monitoring. Medicine was consulted for assistance in management of medical comorbidities. She recovered appropriately. She was transferred to telemetry POD#1. She had no further noted atrial fibrillation events. Her colostomy began to function and her diet as advanced. She worked with PT/OT/WOCN. Home health was recommended for PT/OT and additionally we got her a home health RN for assistance in her newly created stoma. Her daughter was able to attend a teaching session as well. On 05/23/2019 she was doing well, tolerating diet, pain well controlled on oral analgesics and with good stoma function. She was deemed stable for discharge home.  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 - ELEVEN MORPHOLOGICALLY BENIGN LYMPH NODES.  This was  discussed with both the patient and her daughter. All of their questions were answered. We discussed surveillance plans for the carpeting polyp in cecum as well - as they had agreed they would prefer to observe rather than subject to double colectomy at this time  Allergies as of 05/23/2019   No Known Allergies     Medication List    TAKE these medications   aspirin 81 MG chewable tablet Chew 81 mg by mouth daily.   carbamide peroxide 6.5 % OTIC solution Commonly known as:  Debrox Place 5 drops into both ears 2 (two) times daily. X 4-7 days prn   clobetasol cream 0.05 % Commonly known as:  TEMOVATE Apply topically 2 (two) times daily. Not face or underarms use as needed   diphenoxylate-atropine 2.5-0.025 MG tablet Commonly known as:  Lomotil Take 1 tablet by mouth 4 (four) times daily as needed for diarrhea or loose stools.   glimepiride 2 MG tablet Commonly known as:  AMARYL Take 1 tablet (2 mg total) by mouth daily with breakfast.   lisinopril 20 MG tablet Commonly known as:  ZESTRIL Take 1 tablet (20 mg total) by mouth daily. Do not cut in 1/2 dose increased dose to 20 mg   lovastatin 20 MG tablet Commonly known as:  MEVACOR Take 1 tablet (20 mg total) by mouth daily at 6 PM.   metoprolol succinate 100 MG 24 hr tablet Commonly known as:  TOPROL-XL Take 1 tablet (100 mg total) by mouth daily. Take with or immediately following a meal.   traMADol 50 MG tablet Commonly known as:  Ultram Take 1 tablet (50 mg total) by mouth every 6 (six) hours as needed for up to 7 days (postop pain not controlled with  tylenol).   traZODone 50 MG tablet Commonly known as:  DESYREL Take 1 tablet (50 mg total) by mouth at bedtime as needed for sleep.   VITAMIN D PO Take 4,000 Units by mouth daily.            Durable Medical Equipment  (From admission, onward)         Start     Ordered   05/22/19 1254  For home use only DME 3 n 1  Once     05/22/19 1254            Follow-up Information    [facility] [place] Follow up.   Why:  HH nursing/physical therapy/occupational therapy Contact information: [place] [address] [place] Dema Severin, M.D. [facility]

## 2019-05-24 ENCOUNTER — Telehealth: Payer: Self-pay | Admitting: Internal Medicine

## 2019-05-24 NOTE — Telephone Encounter (Signed)
I do not do colostomy bad orders this would be the surgeon who did her surgery  Please call the surgeon  Inform her daughter   Thanks Leonard

## 2019-05-24 NOTE — Telephone Encounter (Signed)
Copied from [place] [id number]. Topic: General - Inquiry >> May 24, 2019  9:48 AM Margot Ables wrote: Reason for CRM: Pt has appt 7/28 but came home yesterday from the hospital after colon surgery. She was told that for any assist devices the PCP must send orders. Pt daughter, [name], pt is needing colostomy bags (wondering if they can get disposable to throw away when changing as pt is having a hard time), seal for the colostomy bag adaptsaring, a bed rail to help her get in and out of her queen size bed. Please advise how to proceed. She was told with orders the medical supply companies would be able to bill insurance so they don't have to order online.   She is hoping to get these items from  [facility] Address: [address], [place], [place] [address] Phone: [phone]  Pt would not be able to do a telephone visit/virtual.   Pt already has order from the hospital for a bedside commode. Pt has a walker from her husband before he passed.

## 2019-05-25 DIAGNOSIS — K5790 Diverticulosis of intestine, part unspecified, without perforation or abscess without bleeding: Secondary | ICD-10-CM | POA: Diagnosis not present

## 2019-05-25 DIAGNOSIS — Z483 Aftercare following surgery for neoplasm: Secondary | ICD-10-CM | POA: Diagnosis not present

## 2019-05-25 DIAGNOSIS — Z9049 Acquired absence of other specified parts of digestive tract: Secondary | ICD-10-CM | POA: Diagnosis not present

## 2019-05-25 DIAGNOSIS — E1122 Type 2 diabetes mellitus with diabetic chronic kidney disease: Secondary | ICD-10-CM | POA: Diagnosis not present

## 2019-05-25 DIAGNOSIS — G4700 Insomnia, unspecified: Secondary | ICD-10-CM | POA: Diagnosis not present

## 2019-05-25 DIAGNOSIS — H9190 Unspecified hearing loss, unspecified ear: Secondary | ICD-10-CM | POA: Diagnosis not present

## 2019-05-25 DIAGNOSIS — N183 Chronic kidney disease, stage 3 (moderate): Secondary | ICD-10-CM | POA: Diagnosis not present

## 2019-05-25 DIAGNOSIS — Z433 Encounter for attention to colostomy: Secondary | ICD-10-CM | POA: Diagnosis not present

## 2019-05-25 DIAGNOSIS — I129 Hypertensive chronic kidney disease with stage 1 through stage 4 chronic kidney disease, or unspecified chronic kidney disease: Secondary | ICD-10-CM | POA: Diagnosis not present

## 2019-05-25 DIAGNOSIS — E785 Hyperlipidemia, unspecified: Secondary | ICD-10-CM | POA: Diagnosis not present

## 2019-05-25 NOTE — Telephone Encounter (Signed)
Left message for patient to return call back. PEC may give and obtain information.  

## 2019-05-25 NOTE — Telephone Encounter (Signed)
Pt son was given the info

## 2019-05-26 DIAGNOSIS — N183 Chronic kidney disease, stage 3 (moderate): Secondary | ICD-10-CM | POA: Diagnosis not present

## 2019-05-26 DIAGNOSIS — Z433 Encounter for attention to colostomy: Secondary | ICD-10-CM | POA: Diagnosis not present

## 2019-05-26 DIAGNOSIS — E785 Hyperlipidemia, unspecified: Secondary | ICD-10-CM | POA: Diagnosis not present

## 2019-05-26 DIAGNOSIS — H9190 Unspecified hearing loss, unspecified ear: Secondary | ICD-10-CM | POA: Diagnosis not present

## 2019-05-26 DIAGNOSIS — Z9049 Acquired absence of other specified parts of digestive tract: Secondary | ICD-10-CM | POA: Diagnosis not present

## 2019-05-26 DIAGNOSIS — E1122 Type 2 diabetes mellitus with diabetic chronic kidney disease: Secondary | ICD-10-CM | POA: Diagnosis not present

## 2019-05-26 DIAGNOSIS — G4700 Insomnia, unspecified: Secondary | ICD-10-CM | POA: Diagnosis not present

## 2019-05-26 DIAGNOSIS — I129 Hypertensive chronic kidney disease with stage 1 through stage 4 chronic kidney disease, or unspecified chronic kidney disease: Secondary | ICD-10-CM | POA: Diagnosis not present

## 2019-05-26 DIAGNOSIS — K5790 Diverticulosis of intestine, part unspecified, without perforation or abscess without bleeding: Secondary | ICD-10-CM | POA: Diagnosis not present

## 2019-05-26 DIAGNOSIS — Z483 Aftercare following surgery for neoplasm: Secondary | ICD-10-CM | POA: Diagnosis not present

## 2019-05-29 DIAGNOSIS — Z483 Aftercare following surgery for neoplasm: Secondary | ICD-10-CM | POA: Diagnosis not present

## 2019-05-29 DIAGNOSIS — E785 Hyperlipidemia, unspecified: Secondary | ICD-10-CM | POA: Diagnosis not present

## 2019-05-29 DIAGNOSIS — G4700 Insomnia, unspecified: Secondary | ICD-10-CM | POA: Diagnosis not present

## 2019-05-29 DIAGNOSIS — I129 Hypertensive chronic kidney disease with stage 1 through stage 4 chronic kidney disease, or unspecified chronic kidney disease: Secondary | ICD-10-CM | POA: Diagnosis not present

## 2019-05-29 DIAGNOSIS — H9190 Unspecified hearing loss, unspecified ear: Secondary | ICD-10-CM | POA: Diagnosis not present

## 2019-05-29 DIAGNOSIS — Z433 Encounter for attention to colostomy: Secondary | ICD-10-CM | POA: Diagnosis not present

## 2019-05-29 DIAGNOSIS — K5790 Diverticulosis of intestine, part unspecified, without perforation or abscess without bleeding: Secondary | ICD-10-CM | POA: Diagnosis not present

## 2019-05-29 DIAGNOSIS — E1122 Type 2 diabetes mellitus with diabetic chronic kidney disease: Secondary | ICD-10-CM | POA: Diagnosis not present

## 2019-05-29 DIAGNOSIS — N183 Chronic kidney disease, stage 3 (moderate): Secondary | ICD-10-CM | POA: Diagnosis not present

## 2019-05-29 DIAGNOSIS — Z9049 Acquired absence of other specified parts of digestive tract: Secondary | ICD-10-CM | POA: Diagnosis not present

## 2019-06-01 DIAGNOSIS — I129 Hypertensive chronic kidney disease with stage 1 through stage 4 chronic kidney disease, or unspecified chronic kidney disease: Secondary | ICD-10-CM | POA: Diagnosis not present

## 2019-06-01 DIAGNOSIS — G4700 Insomnia, unspecified: Secondary | ICD-10-CM | POA: Diagnosis not present

## 2019-06-01 DIAGNOSIS — E1122 Type 2 diabetes mellitus with diabetic chronic kidney disease: Secondary | ICD-10-CM | POA: Diagnosis not present

## 2019-06-01 DIAGNOSIS — K5790 Diverticulosis of intestine, part unspecified, without perforation or abscess without bleeding: Secondary | ICD-10-CM | POA: Diagnosis not present

## 2019-06-01 DIAGNOSIS — Z9049 Acquired absence of other specified parts of digestive tract: Secondary | ICD-10-CM | POA: Diagnosis not present

## 2019-06-01 DIAGNOSIS — N183 Chronic kidney disease, stage 3 (moderate): Secondary | ICD-10-CM | POA: Diagnosis not present

## 2019-06-01 DIAGNOSIS — Z433 Encounter for attention to colostomy: Secondary | ICD-10-CM | POA: Diagnosis not present

## 2019-06-01 DIAGNOSIS — E785 Hyperlipidemia, unspecified: Secondary | ICD-10-CM | POA: Diagnosis not present

## 2019-06-01 DIAGNOSIS — H9190 Unspecified hearing loss, unspecified ear: Secondary | ICD-10-CM | POA: Diagnosis not present

## 2019-06-01 DIAGNOSIS — Z483 Aftercare following surgery for neoplasm: Secondary | ICD-10-CM | POA: Diagnosis not present

## 2019-06-02 ENCOUNTER — Other Ambulatory Visit: Payer: Self-pay

## 2019-06-02 ENCOUNTER — Emergency Department: Payer: Medicare HMO

## 2019-06-02 ENCOUNTER — Ambulatory Visit: Payer: Self-pay | Admitting: Internal Medicine

## 2019-06-02 ENCOUNTER — Emergency Department
Admission: EM | Admit: 2019-06-02 | Discharge: 2019-06-03 | Disposition: A | Discharge status: Home / Self Care | Payer: Medicare HMO | Attending: Emergency Medicine | Admitting: Emergency Medicine

## 2019-06-02 ENCOUNTER — Encounter: Payer: Self-pay | Admitting: Radiology

## 2019-06-02 DIAGNOSIS — R2243 Localized swelling, mass and lump, lower limb, bilateral: Secondary | ICD-10-CM

## 2019-06-02 DIAGNOSIS — I82432 Acute embolism and thrombosis of left popliteal vein: Secondary | ICD-10-CM | POA: Insufficient documentation

## 2019-06-02 DIAGNOSIS — Z79899 Other long term (current) drug therapy: Secondary | ICD-10-CM | POA: Diagnosis not present

## 2019-06-02 DIAGNOSIS — R0600 Dyspnea, unspecified: Secondary | ICD-10-CM | POA: Diagnosis not present

## 2019-06-02 DIAGNOSIS — I129 Hypertensive chronic kidney disease with stage 1 through stage 4 chronic kidney disease, or unspecified chronic kidney disease: Secondary | ICD-10-CM | POA: Insufficient documentation

## 2019-06-02 DIAGNOSIS — I82442 Acute embolism and thrombosis of left tibial vein: Secondary | ICD-10-CM | POA: Diagnosis not present

## 2019-06-02 DIAGNOSIS — I82452 Acute embolism and thrombosis of left peroneal vein: Secondary | ICD-10-CM | POA: Diagnosis not present

## 2019-06-02 DIAGNOSIS — Z87891 Personal history of nicotine dependence: Secondary | ICD-10-CM | POA: Insufficient documentation

## 2019-06-02 DIAGNOSIS — E1122 Type 2 diabetes mellitus with diabetic chronic kidney disease: Secondary | ICD-10-CM | POA: Insufficient documentation

## 2019-06-02 DIAGNOSIS — Z7984 Long term (current) use of oral hypoglycemic drugs: Secondary | ICD-10-CM | POA: Insufficient documentation

## 2019-06-02 DIAGNOSIS — R0602 Shortness of breath: Secondary | ICD-10-CM | POA: Diagnosis not present

## 2019-06-02 DIAGNOSIS — R609 Edema, unspecified: Secondary | ICD-10-CM | POA: Diagnosis not present

## 2019-06-02 DIAGNOSIS — N183 Chronic kidney disease, stage 3 (moderate): Secondary | ICD-10-CM | POA: Insufficient documentation

## 2019-06-02 LAB — TROPONIN I: Troponin I: <0.03 ng/mL (ref <0.03)

## 2019-06-02 LAB — CBC WITH DIFFERENTIAL/PLATELET
Abs Immature Granulocytes: 0.03 10*3/uL (ref 0.00–0.07)
Basophils Absolute: 0.1 10*3/uL (ref 0.0–0.1)
Basophils Relative: 1 %
Eosinophils Absolute: 1.1 10*3/uL — ABNORMAL HIGH (ref 0.0–0.5)
Eosinophils Relative: 10 %
HCT: 37.4 % (ref 36.0–46.0)
Hemoglobin: 12.1 g/dL (ref 12.0–15.0)
Immature Granulocytes: 0 %
Lymphocytes Relative: 31 %
Lymphs Abs: 3.6 10*3/uL (ref 0.7–4.0)
MCH: 30 pg (ref 26.0–34.0)
MCHC: 32.4 g/dL (ref 30.0–36.0)
MCV: 92.8 fL (ref 80.0–100.0)
Monocytes Absolute: 1.4 10*3/uL — ABNORMAL HIGH (ref 0.1–1.0)
Monocytes Relative: 12 %
Neutro Abs: 5.4 10*3/uL (ref 1.7–7.7)
Neutrophils Relative %: 46 %
Platelets: 381 10*3/uL (ref 150–400)
RBC: 4.03 MIL/uL (ref 3.87–5.11)
RDW: 13.7 % (ref 11.5–15.5)
WBC: 11.7 10*3/uL — ABNORMAL HIGH (ref 4.0–10.5)
nRBC: 0 % (ref 0.0–0.2)

## 2019-06-02 LAB — BASIC METABOLIC PANEL
Anion gap: 8 (ref 5–15)
BUN: 18 mg/dL (ref 8–23)
CO2: 25 mmol/L (ref 22–32)
Calcium: 9.4 mg/dL (ref 8.9–10.3)
Chloride: 105 mmol/L (ref 98–111)
Creatinine, Ser: 0.95 mg/dL (ref 0.44–1.00)
GFR calc Af Amer: >60 mL/min (ref >60)
GFR calc non Af Amer: 54 mL/min — ABNORMAL LOW (ref >60)
Glucose, Bld: 166 mg/dL — ABNORMAL HIGH (ref 70–99)
Potassium: 4 mmol/L (ref 3.5–5.1)
Sodium: 138 mmol/L (ref 135–145)

## 2019-06-02 MED ORDER — IOHEXOL 350 MG/ML SOLN
75.0000 mL | Freq: Once | INTRAVENOUS | Status: AC | PRN
  Administered 2019-06-02: 75 mL via INTRAVENOUS

## 2019-06-02 NOTE — ED Notes (Signed)
Patient transported to Ultrasound 

## 2019-06-02 NOTE — Telephone Encounter (Signed)
[name] called and advised of the recommendation from Dr. Terese Door below. She says her mother will not go to the UC or ED. She says that she's not SOB resting and talking, but when she gets up her breathing is different and you can tell it's difficult for her to catch her breath. She says even when she's sleeping her breathing is different. She says the swelling has no redness and the home health nurse mentioned calling to get the patient a water pill for a few days for the swelling. She asked is Dr. Aundra Dubin not going to see her at all? I advised of the recommendation again, then I advised I will call the office to see if there is anything else. I called the office and spoke to Mildred, Therapist, sports and she asked to speak to the patient's daughter, the call was connected successfully.

## 2019-06-02 NOTE — Telephone Encounter (Signed)
For swelling and sob she needs to take her to urgent care or emergency room wearing a mask given she was just in hospital concerned with blood clot   If she has redness could be yeast so otc monistat for vaginal region and otc clotrimazole for rectum can help with Desitin or a butt paste for rectal region   [place]

## 2019-06-02 NOTE — ED Triage Notes (Signed)
Patient reports had surgery 2 weeks ago to remove a tumor.  Reports bilateral leg swelling for approximately a week.  Reports was instructed by her doctor to come be evaluated.

## 2019-06-02 NOTE — Telephone Encounter (Signed)
Daughter called back and wanted patient seen in office advised patient daughter of PCP advice and that she needs to be seen in the ER or UC reiterated the Urgency in patient being seen daughter stated ok To UC or ER. That she should be evaluated immediately.

## 2019-06-02 NOTE — Telephone Encounter (Signed)
Unable to leave message for patient to return call back, due to phone kept ringing. PEC maygive results and obtain information.

## 2019-06-02 NOTE — ED Provider Notes (Signed)
[facility] Emergency Department Provider Note  ____________________________________________   First MD Initiated Contact with Patient 06/02/19 2321     (approximate)  I have reviewed the triage vital signs and the nursing notes.   HISTORY  Chief Complaint Leg Swelling   HPI ESMA KILTS is a 85 y.o. female with below list of previous medical conditions presents to the emergency department with painless bilateral lower extremity swelling x1 week.  Patient states that she had surgery performed 2 weeks ago to "remove a tumor".  Patient does admit to some dyspnea with exertion however not at rest.  Patient denies any chest pain.     Past Medical History:  Diagnosis Date   Diabetes mellitus without complication ([place])    Hard of hearing    Hypertension     Patient Active Problem List   Diagnosis Date Noted   Diabetes mellitus type 2 in obese ([place])    Rectal mass 05/17/2019   Insomnia 09/30/2018   Abnormal gait 09/30/2018   Bilateral impacted cerumen 09/30/2018   Chronic diarrhea 09/30/2018   Type 2 diabetes mellitus with stage 3 chronic kidney disease, without long-term current use of insulin ([place]) 09/30/2018   Hyperlipidemia 09/30/2018   Nosebleed 09/30/2018   Does use hearing aid 09/30/2018   Positive colorectal cancer screening using Cologuard test 08/12/2018   HTN (hypertension) 08/12/2018    Past Surgical History:  Procedure Laterality Date   CESAREAN SECTION     COLONOSCOPY N/A 05/17/2019   Procedure: FLEXIBLE SIGMOIDOSCOPY;  Surgeon: Ileana Roup, MD;  Location: [facility];  Service: General;  Laterality: N/A;   COLONOSCOPY WITH PROPOFOL N/A 01/09/2019   Procedure: COLONOSCOPY WITH PROPOFOL;  Surgeon: Lin Landsman, MD;  Location: [place];  Service: Gastroenterology;  Laterality: N/A;   ESOPHAGOGASTRODUODENOSCOPY (EGD) WITH PROPOFOL N/A 01/09/2019   Procedure: ESOPHAGOGASTRODUODENOSCOPY (EGD) WITH  PROPOFOL;  Surgeon: Lin Landsman, MD;  Location: [facility];  Service: Gastroenterology;  Laterality: N/A;   KNEE SURGERY     left 2017/2018   SHOULDER SURGERY     right   XI ROBOTIC ASSISTED LOWER ANTERIOR RESECTION N/A 05/17/2019   Procedure: XI ROBOTIC ASSISTED LOWER ANTERIOR RESECTION WITH END COLOSTOMY;  Surgeon: Ileana Roup, MD;  Location: [facility];  Service: General;  Laterality: N/A;    Prior to Admission medications   Medication Sig Start Date End Date Taking? Authorizing Provider  aspirin 81 MG chewable tablet Chew 81 mg by mouth daily.    [provider]  carbamide peroxide (DEBROX) 6.5 % OTIC solution Place 5 drops into both ears 2 (two) times daily. X 4-7 days prn Patient not taking: Reported on 03/08/2019 09/30/18   McLean-Scocuzza, Nino Glow, MD  clobetasol cream (TEMOVATE) 0.05 % Apply topically 2 (two) times daily. Not face or underarms use as needed Patient not taking: Reported on 03/08/2019 09/30/18   McLean-Scocuzza, Nino Glow, MD  diphenoxylate-atropine (LOMOTIL) 2.5-0.025 MG tablet Take 1 tablet by mouth 4 (four) times daily as needed for diarrhea or loose stools. Patient not taking: Reported on 03/08/2019 10/13/18   McLean-Scocuzza, Nino Glow, MD  glimepiride (AMARYL) 2 MG tablet Take 1 tablet (2 mg total) by mouth daily with breakfast. 09/30/18   McLean-Scocuzza, Nino Glow, MD  lisinopril (PRINIVIL,ZESTRIL) 20 MG tablet Take 1 tablet (20 mg total) by mouth daily. Do not cut in 1/2 dose increased dose to 20 mg 02/12/19   McLean-Scocuzza, Nino Glow, MD  lovastatin (MEVACOR) 20 MG tablet Take 1 tablet (  20 mg total) by mouth daily at 6 PM. 09/30/18   McLean-Scocuzza, Nino Glow, MD  metoprolol succinate (TOPROL-XL) 100 MG 24 hr tablet Take 1 tablet (100 mg total) by mouth daily. Take with or immediately following a meal. 09/30/18   McLean-Scocuzza, Nino Glow, MD  Rivaroxaban 15 & 20 MG TBPK Take as directed on package: Start with one 15mg  tablet by mouth twice a day  with food. On Day 22, switch to one 20mg  tablet once a day with food. 06/03/19   Gregor Hams, MD  traZODone (DESYREL) 50 MG tablet Take 1 tablet (50 mg total) by mouth at bedtime as needed for sleep. 09/30/18   McLean-Scocuzza, Nino Glow, MD  VITAMIN D PO Take 4,000 Units by mouth daily.     [provider]    Allergies Patient has no known allergies.  No family history on file.  Social History Social History   Tobacco Use   Smoking status: Former Smoker    Years: 4.00   Smokeless tobacco: Never Used   Tobacco comment: 3 per day  Substance Use Topics   Alcohol use: No    Frequency: Never   Drug use: No    Review of Systems Constitutional: No fever/chills Eyes: No visual changes. ENT: No sore throat. Cardiovascular: Denies chest pain. Respiratory: Positive for shortness of breath. Gastrointestinal: No abdominal pain.  No nausea, no vomiting.  No diarrhea.  No constipation. Genitourinary: Negative for dysuria. Musculoskeletal: Negative for neck pain.  Negative for back pain.  Positive for bilateral lower extremity swelling Integumentary: Negative for rash. Neurological: Negative for headaches, focal weakness or numbness.   ____________________________________________   PHYSICAL EXAM:  VITAL SIGNS: ED Triage Vitals  Enc Vitals Group     BP 06/02/19 1955 (!) 160/56     Pulse Rate 06/02/19 1955 67     Resp 06/02/19 1955 18     Temp 06/02/19 1955 98.4 F (36.9 C)     Temp Source 06/02/19 1955 Oral     SpO2 06/02/19 1955 97 %     Weight --      Height --      Head Circumference --      Peak Flow --      Pain Score 06/02/19 1956 0     Pain Loc --      Pain Edu? --      Excl. in [place]? --     Constitutional: Alert and oriented. Well appearing and in no acute distress. Eyes: Conjunctivae are normal.  Mouth/Throat: Mucous membranes are moist. Oropharynx non-erythematous. Neck: No stridor.   Cardiovascular: Normal rate, regular rhythm. Good peripheral  circulation. Grossly normal heart sounds. Respiratory: Normal respiratory effort.  No retractions. No audible wheezing. Gastrointestinal: Soft and nontender. No distention.  Musculoskeletal:  Bilateral lower extremity nonpitting edema left worse than right Neurologic:  Normal speech and language. No gross focal neurologic deficits are appreciated.  Skin:  Skin is warm, dry and intact. No rash noted. Psychiatric: Mood and affect are normal. Speech and behavior are normal.  ____________________________________________   LABS (all labs ordered are listed, but only abnormal results are displayed)  Labs Reviewed  CBC WITH DIFFERENTIAL/PLATELET - Abnormal; Notable for the following components:      Result Value   WBC 11.7 (*)    Monocytes Absolute 1.4 (*)    Eosinophils Absolute 1.1 (*)    All other components within normal limits  BASIC METABOLIC PANEL - Abnormal; Notable for the following components:  Glucose, Bld 166 (*)    GFR calc non Af Amer 54 (*)    All other components within normal limits  TROPONIN I     ____________________________________________  RADIOLOGY I, Vado N Dwon Sky, personally viewed and evaluated these images (plain radiographs) as part of my medical decision making, as well as reviewing the written report by the radiologist.  ED MD interpretation: No PE.  Official radiology report(s): Ct Angio Chest Pe W And/or Wo Contrast  Result Date: 06/03/2019 CLINICAL DATA:  Leg swelling with shortness of breath. EXAM: CT ANGIOGRAPHY CHEST WITH CONTRAST TECHNIQUE: Multidetector CT imaging of the chest was performed using the standard protocol during bolus administration of intravenous contrast. Multiplanar CT image reconstructions and MIPs were obtained to evaluate the vascular anatomy. CONTRAST:  1mL OMNIPAQUE IOHEXOL 350 MG/ML SOLN COMPARISON:  None. FINDINGS: Cardiovascular: Satisfactory opacification of the pulmonary arteries to the segmental level. No evidence of  pulmonary embolism. The heart size is moderately enlarged. The main pulmonary artery is not significantly dilated. There are mitral valve calcifications with left atrial enlargement. There is reflux of contrast into the IVC. Aortic calcifications are noted. Mediastinum/Nodes: No enlarged mediastinal, hilar, or axillary lymph nodes. Thyroid gland, trachea, and esophagus demonstrate no significant findings. Lungs/Pleura: Lungs are clear. No pleural effusion or pneumothorax. Upper Abdomen: There is a small hiatal hernia. Musculoskeletal: No chest wall abnormality. No acute or significant osseous findings. Review of the MIP images confirms the above findings. IMPRESSION: 1. No PE. 2. Cardiac enlargement with reflux of contrast into the IVC consistent with underlying cardiac dysfunction. Aortic Atherosclerosis (ICD10-I70.0). Electronically Signed   By: Constance Holster M.D.   On: 06/03/2019 00:27   US Venous Img Lower Bilateral  Result Date: 06/02/2019 CLINICAL DATA:  Bilateral lower extremity swelling EXAM: BILATERAL LOWER EXTREMITY VENOUS DOPPLER ULTRASOUND TECHNIQUE: Gray-scale sonography with graded compression, as well as color Doppler and duplex ultrasound were performed to evaluate the lower extremity deep venous systems from the level of the common femoral vein and including the common femoral, femoral, profunda femoral, popliteal and calf veins including the posterior tibial, peroneal and gastrocnemius veins when visible. The superficial great saphenous vein was also interrogated. Spectral Doppler was utilized to evaluate flow at rest and with distal augmentation maneuvers in the common femoral, femoral and popliteal veins. COMPARISON:  None. FINDINGS: RIGHT LOWER EXTREMITY Common Femoral Vein: No evidence of thrombus. Normal compressibility, respiratory phasicity and response to augmentation. Saphenofemoral Junction: No evidence of thrombus. Normal compressibility and flow on color Doppler imaging.  Profunda Femoral Vein: No evidence of thrombus. Normal compressibility and flow on color Doppler imaging. Femoral Vein: No evidence of thrombus. Normal compressibility, respiratory phasicity and response to augmentation. Popliteal Vein: No evidence of thrombus. Normal compressibility, respiratory phasicity and response to augmentation. Calf Veins: No evidence of thrombus. Normal compressibility and flow on color Doppler imaging. Superficial Great Saphenous Vein: No evidence of thrombus. Normal compressibility. Venous Reflux: A small amount of reflux is noted in the right common femoral vein. Other Findings:  None. LEFT LOWER EXTREMITY Common Femoral Vein: No evidence of thrombus. Normal compressibility, respiratory phasicity and response to augmentation. Saphenofemoral Junction: No evidence of thrombus. Normal compressibility and flow on color Doppler imaging. Profunda Femoral Vein: No evidence of thrombus. Normal compressibility and flow on color Doppler imaging. Femoral Vein: No evidence of thrombus. Normal compressibility, respiratory phasicity and response to augmentation. Popliteal Vein: There is nonocclusive thrombus in the distal popliteal vein. Calf Veins: There is occlusive thrombus in the posterior tibial  and peroneal veins. Superficial Great Saphenous Vein: No evidence of thrombus. Normal compressibility. Venous Reflux: A small amount reflux is noted in the left common femoral vein. Other Findings:  None. IMPRESSION: 1. Nonocclusive DVT within the left popliteal vein. There is occlusive thrombus within the posterior tibial and peroneal veins on the left. 2. No DVT identified involving the right lower extremity. Electronically Signed   By: Constance Holster M.D.   On: 06/02/2019 22:25     Procedures   ____________________________________________   INITIAL IMPRESSION / MDM / [place] / ED COURSE  As part of my medical decision making, I reviewed the following data within the  electronic MEDICAL RECORD NUMBER  85 year old female presented with above-stated history and physical exam concerning for possible lower extremity DVT.  Ultrasound revealed a left popliteal DVT.  Patient started on Xarelto after having a discussion with Dr. Ninfa Linden general surgery.  Patient will follow-up with primary care provider for further outpatient evaluation.  Patient advised of bleeding risks associated with anticoagulation.  *KAENA SANTORI was evaluated in Emergency Department on 06/03/2019 for the symptoms described in the history of present illness. She was evaluated in the context of the global COVID-19 pandemic, which necessitated consideration that the patient might be at risk for infection with the SARS-CoV-2 virus that causes COVID-19. Institutional protocols and algorithms that pertain to the evaluation of patients at risk for COVID-19 are in a state of rapid change based on information released by regulatory bodies including the CDC and federal and state organizations. These policies and algorithms were followed during the patient's care in the ED.  Some ED evaluations and interventions may be delayed as a result of limited staffing during the pandemic.*    ____________________________________________  FINAL CLINICAL IMPRESSION(S) / ED DIAGNOSES  Final diagnoses:  Acute deep vein thrombosis (DVT) of popliteal vein of left lower extremity (HCC)     MEDICATIONS GIVEN DURING THIS VISIT:  Medications  Rivaroxaban (XARELTO) tablet 15 mg (has no administration in time range)  iohexol (OMNIPAQUE) 350 MG/ML injection 75 mL (75 mLs Intravenous Contrast Given 06/02/19 2350)     ED Discharge Orders         Ordered    Rivaroxaban 15 & 20 MG TBPK     06/03/19 0108           Note:  This document was prepared using Dragon voice recognition software and may include unintentional dictation errors.   Gregor Hams, MD 06/03/19 [phone]

## 2019-06-02 NOTE — Telephone Encounter (Signed)
Pt.'s daughter reports pt. Has swelling to both ankles - left goes up to the knee. Right side just the ankle. Has some shortness of breath with exertion. Also has redness to her vaginal and rectum. Using Desitin, but it is not helping. Daughter is unsure if there is any odor to skin area. Please advise daughter, Jackelyn Poling. [phone]. She also called the surgeon about colostomy supplies and was told they would come from her PCP.  Answer Assessment - Initial Assessment Questions 1. ONSET: "When did the swelling start?" (e.g., minutes, hours, days)     Started 1 week ago 2. LOCATION: "What part of the leg is swollen?"  "Are both legs swollen or just one leg?"     Both ankles and legs 3. SEVERITY: "How bad is the swelling?" (e.g., localized; mild, moderate, severe)  - Localized - small area of swelling localized to one leg  - MILD pedal edema - swelling limited to foot and ankle, pitting edema < 1/4 inch (6 mm) deep, rest and elevation eliminate most or all swelling  - MODERATE edema - swelling of lower leg to knee, pitting edema > 1/4 inch (6 mm) deep, rest and elevation only partially reduce swelling  - SEVERE edema - swelling extends above knee, facial or hand swelling present      Moderate 4. REDNESS: "Does the swelling look red or infected?"     No 5. PAIN: "Is the swelling painful to touch?" If so, ask: "How painful is it?"   (Scale 1-10; mild, moderate or severe)     No 6. FEVER: "Do you have a fever?" If so, ask: "What is it, how was it measured, and when did it start?"      No 7. CAUSE: "What do you think is causing the leg swelling?"     Unsure 8. MEDICAL HISTORY: "Do you have a history of heart failure, kidney disease, liver failure, or cancer?"     Kidney disease 9. RECURRENT SYMPTOM: "Have you had leg swelling before?" If so, ask: "When was the last time?" "What happened that time?"     No 10. OTHER SYMPTOMS: "Do you have any other symptoms?" (e.g., chest pain, difficulty  breathing)       No 11. PREGNANCY: "Is there any chance you are pregnant?" "When was your last menstrual period?"       No  Protocols used: LEG SWELLING AND EDEMA-A-AH

## 2019-06-02 NOTE — ED Notes (Signed)
Pt back from US at this time.

## 2019-06-03 DIAGNOSIS — N183 Chronic kidney disease, stage 3 (moderate): Secondary | ICD-10-CM | POA: Diagnosis not present

## 2019-06-03 DIAGNOSIS — E1122 Type 2 diabetes mellitus with diabetic chronic kidney disease: Secondary | ICD-10-CM | POA: Diagnosis not present

## 2019-06-03 DIAGNOSIS — Z7984 Long term (current) use of oral hypoglycemic drugs: Secondary | ICD-10-CM | POA: Diagnosis not present

## 2019-06-03 DIAGNOSIS — Z79899 Other long term (current) drug therapy: Secondary | ICD-10-CM | POA: Diagnosis not present

## 2019-06-03 DIAGNOSIS — Z87891 Personal history of nicotine dependence: Secondary | ICD-10-CM | POA: Diagnosis not present

## 2019-06-03 DIAGNOSIS — I82432 Acute embolism and thrombosis of left popliteal vein: Secondary | ICD-10-CM | POA: Diagnosis not present

## 2019-06-03 DIAGNOSIS — I129 Hypertensive chronic kidney disease with stage 1 through stage 4 chronic kidney disease, or unspecified chronic kidney disease: Secondary | ICD-10-CM | POA: Diagnosis not present

## 2019-06-03 DIAGNOSIS — R0600 Dyspnea, unspecified: Secondary | ICD-10-CM | POA: Diagnosis not present

## 2019-06-03 MED ORDER — RIVAROXABAN 15 MG PO TABS
15.0000 mg | ORAL_TABLET | Freq: Once | ORAL | Status: AC
  Administered 2019-06-03: 15 mg via ORAL
  Filled 2019-06-03: qty 1

## 2019-06-03 MED ORDER — RIVAROXABAN (XARELTO) VTE STARTER PACK (15 & 20 MG): ORAL_TABLET | ORAL | 0 refills | Status: DC

## 2019-06-03 NOTE — ED Notes (Signed)
Pt assisted with urination.

## 2019-06-05 ENCOUNTER — Telehealth: Payer: Self-pay | Admitting: *Deleted

## 2019-06-05 NOTE — Telephone Encounter (Signed)
Copied from [place] [id number]. Topic: Quick Communication - Home Health Verbal Orders >> Jun 05, 2019  9:54 AM Scherrie Gerlach wrote: Caller/Agency: Cecille Rubin ///[facility] Callback Number: [phone] Pt has declined OT referral.  She states pt seems to be doing good from OT perspective Nothing further needed

## 2019-06-06 ENCOUNTER — Telehealth: Payer: Self-pay

## 2019-06-06 NOTE — Telephone Encounter (Signed)
havent we already done this? Copied from [place] [phone]. Topic: Appointment Scheduling - Scheduling Inquiry for Clinic >> Jun 06, 2019 12:51 PM Alanda Slim E wrote: Reason for CRM: Pt needs an appt for a hospital follow up.  / please advise

## 2019-06-06 NOTE — Telephone Encounter (Signed)
FYI

## 2019-06-07 ENCOUNTER — Other Ambulatory Visit: Payer: Self-pay

## 2019-06-07 ENCOUNTER — Telehealth: Payer: Self-pay | Admitting: Internal Medicine

## 2019-06-07 DIAGNOSIS — H9190 Unspecified hearing loss, unspecified ear: Secondary | ICD-10-CM | POA: Diagnosis not present

## 2019-06-07 DIAGNOSIS — G4700 Insomnia, unspecified: Secondary | ICD-10-CM | POA: Diagnosis not present

## 2019-06-07 DIAGNOSIS — Z9049 Acquired absence of other specified parts of digestive tract: Secondary | ICD-10-CM | POA: Diagnosis not present

## 2019-06-07 DIAGNOSIS — Z433 Encounter for attention to colostomy: Secondary | ICD-10-CM | POA: Diagnosis not present

## 2019-06-07 DIAGNOSIS — I129 Hypertensive chronic kidney disease with stage 1 through stage 4 chronic kidney disease, or unspecified chronic kidney disease: Secondary | ICD-10-CM | POA: Diagnosis not present

## 2019-06-07 DIAGNOSIS — N183 Chronic kidney disease, stage 3 (moderate): Secondary | ICD-10-CM | POA: Diagnosis not present

## 2019-06-07 DIAGNOSIS — Z483 Aftercare following surgery for neoplasm: Secondary | ICD-10-CM | POA: Diagnosis not present

## 2019-06-07 DIAGNOSIS — E1122 Type 2 diabetes mellitus with diabetic chronic kidney disease: Secondary | ICD-10-CM | POA: Diagnosis not present

## 2019-06-07 DIAGNOSIS — E785 Hyperlipidemia, unspecified: Secondary | ICD-10-CM | POA: Diagnosis not present

## 2019-06-07 DIAGNOSIS — K5790 Diverticulosis of intestine, part unspecified, without perforation or abscess without bleeding: Secondary | ICD-10-CM | POA: Diagnosis not present

## 2019-06-07 NOTE — Telephone Encounter (Signed)
She can make f/u in the office  I rec for her blood clot in the leg see see a blood specialist to determine hoe long she needs to be on the xarelto for a blood clot  -does she want referral  She must wear a mask if she comes into office and her daughter as well with no sx's of COVID 19   [place]

## 2019-06-07 NOTE — Telephone Encounter (Signed)
Pt already scheduled

## 2019-06-07 NOTE — Telephone Encounter (Signed)
Pt went to the ED on Friday, 06/02/2019 for blood clot behind the left knee, pt on blood thinner. Pt can not do virtual or telephone visit because she can not hear. Pt's want come into the office. Please call daughter, Jackelyn Poling her daughter at [phone].

## 2019-06-07 NOTE — Telephone Encounter (Signed)
Copied from [place] [phone]. Topic: Quick Communication - See Telephone Encounter >> Jun 07, 2019  4:44 PM Valla Leaver wrote: CRM for notification. See Telephone encounter for: 06/07/19. [place], RN w/ [facility] calling to notify Dr. Aundra Dubin that Nystatin powder is needed for stoma and needs a script for a walker with wheels that has a pouch on it for resting.

## 2019-06-07 NOTE — Telephone Encounter (Signed)
Left message for patients daughter to return call back.  

## 2019-06-08 ENCOUNTER — Ambulatory Visit (INDEPENDENT_AMBULATORY_CARE_PROVIDER_SITE_OTHER): Payer: Medicare HMO | Admitting: Internal Medicine

## 2019-06-08 ENCOUNTER — Ambulatory Visit: Payer: Medicare HMO | Admitting: Internal Medicine

## 2019-06-08 ENCOUNTER — Encounter: Payer: Self-pay | Admitting: Internal Medicine

## 2019-06-08 VITALS — BP 134/70 | HR 64 | Temp 98.0°F | Ht 64.0 in | Wt 193.2 lb

## 2019-06-08 DIAGNOSIS — R319 Hematuria, unspecified: Secondary | ICD-10-CM

## 2019-06-08 DIAGNOSIS — I824Z2 Acute embolism and thrombosis of unspecified deep veins of left distal lower extremity: Secondary | ICD-10-CM

## 2019-06-08 DIAGNOSIS — D72829 Elevated white blood cell count, unspecified: Secondary | ICD-10-CM

## 2019-06-08 DIAGNOSIS — E559 Vitamin D deficiency, unspecified: Secondary | ICD-10-CM

## 2019-06-08 DIAGNOSIS — I10 Essential (primary) hypertension: Secondary | ICD-10-CM

## 2019-06-08 DIAGNOSIS — E669 Obesity, unspecified: Secondary | ICD-10-CM

## 2019-06-08 DIAGNOSIS — K6289 Other specified diseases of anus and rectum: Secondary | ICD-10-CM

## 2019-06-08 DIAGNOSIS — E1169 Type 2 diabetes mellitus with other specified complication: Secondary | ICD-10-CM | POA: Diagnosis not present

## 2019-06-08 DIAGNOSIS — R600 Localized edema: Secondary | ICD-10-CM | POA: Diagnosis not present

## 2019-06-08 DIAGNOSIS — I4891 Unspecified atrial fibrillation: Secondary | ICD-10-CM | POA: Diagnosis not present

## 2019-06-08 DIAGNOSIS — E1122 Type 2 diabetes mellitus with diabetic chronic kidney disease: Secondary | ICD-10-CM | POA: Diagnosis not present

## 2019-06-08 DIAGNOSIS — G4734 Idiopathic sleep related nonobstructive alveolar hypoventilation: Secondary | ICD-10-CM

## 2019-06-08 DIAGNOSIS — N183 Chronic kidney disease, stage 3 (moderate): Secondary | ICD-10-CM

## 2019-06-08 MED ORDER — FUROSEMIDE 20 MG PO TABS: 20.0000 mg | ORAL_TABLET | Freq: Every day | ORAL | 2 refills | Status: DC | PRN

## 2019-06-08 MED ORDER — LISINOPRIL 20 MG PO TABS: 20.0000 mg | ORAL_TABLET | Freq: Every day | ORAL | 3 refills | Status: DC

## 2019-06-08 NOTE — Progress Notes (Addendum)
Chief Complaint  Patient presents with  . Follow-up   ED f/u with daughter [name] whom she lives seen in ED 06/02/2019 + DVT  1. DVT left ower ext daughter has noted b/l leg edema  [place] 06/02/19  IMPRESSION: 1. Nonocclusive DVT within the left popliteal vein. There is occlusive thrombus within the posterior tibial and peroneal veins on the left. 2. No DVT identified involving the right lower extremity.  She is on xarelto 15 mg bid and will start 20 mg qd after this but medication is expensive $>300 per month with limited income  2. 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 Afib with RVR noted with surgery unclear if having multiple episodes of Afib Chads2 score 3 5.9 % risk of event if no coumadin/anticoagulation  Reviewed stress test 02/24/19 EF 66%   4. DM 2 A1C 6.7 05/11/2019 metformin has given diarrhea in the past per daughter on amaryl 2 mg in am and tolerating   5.HTN controlled today on lis 20 mg qd, toprol xl 100 mg qd  6. Vitamin D def taking 4000 IU vitamin D otc per day   7. Elevated WBC 11.7 will repeat in 2 weeks and check urine due to blood in urine daughter c/w elevation   8. Per daughter qhs her moms o2 is dropping but with deep breath better O2 today RA 94%   Review of Systems  Constitutional: Negative for weight loss.  HENT: Positive for hearing loss.   Eyes: Negative for blurred vision.  Respiratory: Negative for shortness of breath.        At night per daughter O2 drops but improves with deep breath   Cardiovascular: Positive for leg swelling. Negative for chest pain.  Gastrointestinal: Negative for abdominal pain.  Musculoskeletal: Negative for falls.  Skin: Negative for rash.  Neurological: Negative for headaches.  Psychiatric/Behavioral: Negative for  depression.   Past Medical History:  Diagnosis Date  . Diabetes mellitus without complication ([place])   . DVT (deep venous thrombosis) ([place])    06/02/2019  . Hard of hearing   . Hypertension    Past Surgical History:  Procedure Laterality Date  . CESAREAN SECTION    . COLONOSCOPY N/A 05/17/2019   Procedure: FLEXIBLE SIGMOIDOSCOPY;  Surgeon: Ileana Roup, MD;  Location: [facility];  Service: General;  Laterality: N/A;  . COLONOSCOPY WITH PROPOFOL N/A 01/09/2019   Procedure: COLONOSCOPY WITH PROPOFOL;  Surgeon: Lin Landsman, MD;  [facility] END[facility]COPY;  Service: Gastroenterology;  Laterality: N/A;  . ESOPHAGOGASTRODUODENOSCOPY (EGD) WITH PROPOFOL N/A 01/09/2019   Procedure: ESOPHAGOGASTRODUODENOSCOPY (EGD) WITH PROPOFOL;  Surgeon: Lin Landsman, MD;  Location: [facility] ENDOSCOPY;  Service: Gastroenterology;  Laterality: N/A;  . KNEE SURGERY     left 2017/2018  . SHOULDER SURGERY     right  . XI ROBOTIC ASSISTED LOWER ANTERIOR RESECTION N/A 05/17/2019   Procedure: XI ROBOTIC ASSISTED LOWER ANTERIOR RESECTION WITH END COLOSTOMY;  Surgeon: Ileana Roup, MD;  Location: [facility];  Service: General;  Laterality: N/A;   History reviewed. No pertinent family history. Social History   Socioeconomic History  . Marital status: Widowed    Spouse name: Not on file  . Number of children: Not on file  . Years of education: Not on file  . Highest  education level: Not on file  Occupational History  . Not on file  Social Needs  . Financial resource strain: Not on file  . Food insecurity    Worry: Not on file    Inability: Not on file  . Transportation needs    Medical: Not on file    Non-medical: Not on file  Tobacco Use  . Smoking status: Former Smoker    Years: 4.00  . Smokeless tobacco: Never Used  . Tobacco comment: 3 per day  Substance and Sexual Activity  . Alcohol use: No    Frequency: Never  . Drug use: No  . Sexual activity: Not on file  Lifestyle  .  Physical activity    Days per week: Not on file    Minutes per session: Not on file  . Stress: Not on file  Relationships  . Social Herbalist on phone: Not on file    Gets together: Not on file    Attends religious service: Not on file    Active member of club or organization: Not on file    Attends meetings of clubs or organizations: Not on file    Relationship status: Not on file  . Intimate partner violence    Fear of current or ex partner: Not on file    Emotionally abused: Not on file    Physically abused: Not on file    Forced sexual activity: Not on file  Other Topics Concern  . Not on file  Social History Narrative   Retired Pharmacist, hospital    2 daughters lives with [name]    1 son deceased    Widowed    Moved from [place]   Current Meds  Medication Sig  . aspirin 81 MG chewable tablet Chew 81 mg by mouth daily.  . Cholecalciferol 100 MCG (4000 UT) CAPS Take by mouth.  Marland Kitchen glimepiride (AMARYL) 2 MG tablet Take 1 tablet (2 mg total) by mouth daily with breakfast.  . lisinopril (ZESTRIL) 20 MG tablet Take 1 tablet (20 mg total) by mouth daily. Do not cut in 1/2 dose increased dose to 20 mg  . lovastatin (MEVACOR) 20 MG tablet Take 1 tablet (20 mg total) by mouth daily at 6 PM.  . metoprolol succinate (TOPROL-XL) 100 MG 24 hr tablet Take 1 tablet (100 mg total) by mouth daily. Take with or immediately following a meal.  . Rivaroxaban 15 & 20 MG TBPK Take as directed on package: Start with one 36m tablet by mouth twice a day with food. On Day 22, switch to one 253mtablet once a day with food.  . [DISCONTINUED] lisinopril (PRINIVIL,ZESTRIL) 20 MG tablet Take 1 tablet (20 mg total) by mouth daily. Do not cut in 1/2 dose increased dose to 20 mg   Allergies  Allergen Reactions  . Metformin And Related     Diarrhea    Recent Results (from the past 2160 hour(s))  Glucose, capillary     Status: None   Collection Time: 03/13/19  8:19 AM  Result Value Ref Range    Glucose-Capillary 90 70 - 99 mg/dL  CBC WITH DIFFERENTIAL     Status: Abnormal   Collection Time: 03/13/19  9:05 AM  Result Value Ref Range   WBC 11.8 (H) 4.0 - 10.5 K/uL   RBC 4.69 3.87 - 5.11 MIL/uL   Hemoglobin 13.7 12.0 - 15.0 g/dL   HCT 44.7 36.0 - 46.0 %   MCV 95.3 80.0 - 100.0 fL  MCH 29.2 26.0 - 34.0 pg   MCHC 30.6 30.0 - 36.0 g/dL   RDW 13.9 11.5 - 15.5 %   Platelets 280 150 - 400 K/uL   nRBC 0.0 0.0 - 0.2 %   Neutrophils Relative % 54 %   Neutro Abs 6.3 1.7 - 7.7 K/uL   Lymphocytes Relative 29 %   Lymphs Abs 3.5 0.7 - 4.0 K/uL   Monocytes Relative 12 %   Monocytes Absolute 1.4 (H) 0.1 - 1.0 K/uL   Eosinophils Relative 5 %   Eosinophils Absolute 0.6 (H) 0.0 - 0.5 K/uL   Basophils Relative 0 %   Basophils Absolute 0.1 0.0 - 0.1 K/uL   Immature Granulocytes 0 %   Abs Immature Granulocytes 0.04 0.00 - 0.07 K/uL    Comment: Performed at [facility], [place] [address] [place], [place] [address]  Comprehensive metabolic panel     Status: Abnormal   Collection Time: 03/13/19  9:05 AM  Result Value Ref Range   Sodium 138 135 - 145 mmol/L   Potassium 4.6 3.5 - 5.1 mmol/L   Chloride 105 98 - 111 mmol/L   CO2 26 22 - 32 mmol/L   Glucose, Bld 99 70 - 99 mg/dL   BUN 22 8 - 23 mg/dL   Creatinine, Ser 1.06 (H) 0.44 - 1.00 mg/dL   Calcium 9.6 8.9 - 10.3 mg/dL   Total Protein 7.4 6.5 - 8.1 g/dL   Albumin 4.0 3.5 - 5.0 g/dL   AST 19 15 - 41 U/L   ALT 12 0 - 44 U/L   Alkaline Phosphatase 46 38 - 126 U/L   Total Bilirubin 0.9 0.3 - 1.2 mg/dL   GFR calc non Af Amer 47 (L) >60 mL/min   GFR calc Af Amer 55 (L) >60 mL/min   Anion gap 7 5 - 15    Comment: Performed at [facility], [place] [address] [place], [place] [address]  Hemoglobin A1c     Status: Abnormal   Collection Time: 03/13/19  9:05 AM  Result Value Ref Range   Hgb A1c MFr Bld 6.6 (H) 4.8 - 5.6 %    Comment: (NOTE) Pre diabetes:          5.7%-6.4% Diabetes:               >6.4% Glycemic control for   <7.0% adults with diabetes    Mean Plasma Glucose 142.72 mg/dL    Comment: Performed at [place] [address], [place], [place] [address]  Type and screen     Status: None   Collection Time: 03/13/19  9:05 AM  Result Value Ref Range   ABO/RH(D) O POS    Antibody Screen NEG    Sample Expiration 03/18/2019    Extend sample reason      NO TRANSFUSIONS OR PREGNANCY IN THE PAST 3 MONTHS Performed at [facility], [place] [address] [place], [place] [address]   ABO/Rh     Status: None   Collection Time: 03/13/19  9:22 AM  Result Value Ref Range   ABO/RH(D)      O POS Performed at [facility], [place] [address], [place],  [address]   Glucose, capillary     Status: None   Collection Time: 05/11/19 10:17 AM  Result Value Ref Range   Glucose-Capillary 93 70 - 99 mg/dL  APTT     Status: None   Collection Time: 05/11/19 10:54 AM  Result Value Ref Range   aPTT 27 24 - 36 seconds    Comment: Performed at [facility], [place] [address], [place], [place] [address]  CBC WITH DIFFERENTIAL     Status: Abnormal   Collection Time: 05/11/19 10:54 AM  Result Value Ref Range   WBC 11.2 (H) 4.0 - 10.5 K/uL   RBC 4.39 3.87 - 5.11 MIL/uL   Hemoglobin 13.6 12.0 - 15.0 g/dL   HCT 41.7 36.0 - 46.0 %   MCV 95.0 80.0 - 100.0 fL   MCH 31.0 26.0 - 34.0 pg   MCHC 32.6 30.0 - 36.0 g/dL   RDW 13.3 11.5 - 15.5 %   Platelets 275 150 - 400 K/uL   nRBC 0.0 0.0 - 0.2 %   Neutrophils Relative % 47 %   Neutro Abs 5.3 1.7 - 7.7 K/uL   Lymphocytes Relative 32 %   Lymphs Abs 3.6 0.7 - 4.0 K/uL   Monocytes Relative 13 %   Monocytes Absolute 1.4 (H) 0.1 - 1.0 K/uL   Eosinophils Relative 8 %   Eosinophils Absolute 0.9 (H) 0.0 - 0.5 K/uL   Basophils Relative 0 %   Basophils Absolute 0.0 0.0 - 0.1 K/uL   Immature Granulocytes 0 %   Abs Immature Granulocytes 0.03 0.00 - 0.07 K/uL    Comment: Performed at [facility], [place] [address], [place], [place] [address]  CEA     Status: None   Collection Time: 05/11/19 10:54 AM  Result Value Ref Range   CEA 3.2 0.0 - 4.7 ng/mL    Comment: (NOTE)                             Nonsmokers          <3.9                             Smokers             <5.6 Roche [employer]ostics Electrochemiluminescence Immunoassay (ECLIA) Values obtained with different assay methods or kits cannot be used interchangeably.  Results cannot be interpreted as absolute evidence of the presence or absence of malignant disease. Performed At: [facility] [place], [place] [id number] Rush Farmer MD OI:[id number]   Comprehensive metabolic panel     Status: Abnormal   Collection Time: 05/11/19 10:54 AM  Result Value Ref Range   Sodium 139 135 - 145 mmol/L   Potassium 4.7 3.5 - 5.1 mmol/L   Chloride 105 98 - 111 mmol/L   CO2 28 22 - 32 mmol/L   Glucose, Bld 81 70 - 99 mg/dL   BUN 21 8 - 23 mg/dL   Creatinine, Ser 1.00 0.44 - 1.00 mg/dL   Calcium 9.7 8.9 - 10.3 mg/dL   Total Protein 7.2 6.5 - 8.1 g/dL   Albumin 4.0 3.5 - 5.0 g/dL   AST 17 15 - 41 U/L   ALT 12 0 - 44 U/L   Alkaline Phosphatase 44 38 - 126 U/L   Total Bilirubin 0.5 0.3 - 1.2 mg/dL   GFR calc non Af Amer 51 (L) >60 mL/min   GFR calc Af Amer 59 (L) >60 mL/min   Anion gap 6 5 - 15    Comment: Performed at [facility], [place] [address] [place], [place] [address]  Hemoglobin A1c     Status:  Abnormal   Collection Time: 05/11/19 10:54 AM  Result Value Ref Range   Hgb A1c MFr Bld 6.7 (H) 4.8 - 5.6 %    Comment: (NOTE) Pre diabetes:          5.7%-6.4% Diabetes:              >6.4% Glycemic control for   <7.0% adults with diabetes    Mean Plasma Glucose 145.59 mg/dL    Comment: Performed at [place] [address], [place], [place] [address]  Protime-INR     Status: None   Collection Time: 05/11/19 10:54 AM  Result Value Ref Range    Prothrombin Time 13.2 11.4 - 15.2 seconds   INR 1.0 0.8 - 1.2    Comment: (NOTE) INR goal varies based on device and disease states. Performed at [facility], [place] [address], [place], [place] [address]   Type and screen     Status: None   Collection Time: 05/11/19 10:54 AM  Result Value Ref Range   ABO/RH(D) O POS    Antibody Screen NEG    Sample Expiration 05/20/2019,2359    Extend sample reason      NO TRANSFUSIONS OR PREGNANCY IN THE PAST 3 MONTHS Performed at [place] [address] [place], [place] [address]   Novel Coronavirus, NAA (hospital order; send-out to ref lab)     Status: None   Collection Time: 05/11/19 11:43 AM   Specimen: Nasopharyngeal Swab; Respiratory  Result Value Ref Range   SARS-CoV-2, NAA NOT DETECTED NOT DETECTED    Comment: (NOTE) This test was developed and its performance characteristics determined by [employer]. This test has not been FDA cleared or approved. This test has been authorized by FDA under an Emergency Use Authorization (EUA). This test is only authorized for the duration of time the declaration that circumstances exist justifying the authorization of the emergency use of in vitro diagnostic tests for detection of SARS-CoV-2 virus and/or diagnosis of COVID-19 infection under section 564(b)(1) of the Act, 21 U.S.C. 891QXI-5(W)(3), unless the authorization is terminated or revoked sooner. When diagnostic testing is negative, the possibility of a false negative result should be considered in the context of a patient's recent exposures and the presence of clinical signs and symptoms consistent with COVID-19. An individual without symptoms of COVID-19 and who is not shedding SARS-CoV-2 virus would expect to have a negative (not detected) result in this assay. Performed  At: [facility] [place], [place] [id number] Rush Farmer MD [id number]    Coronavirus  Source NASOPHARYNGEAL     Comment: Performed at [facility], [place] [address] [place], [place] [address]  Glucose, capillary     Status: Abnormal   Collection Time: 05/17/19  9:27 AM  Result Value Ref Range   Glucose-Capillary 120 (H) 70 - 99 mg/dL  Glucose, capillary     Status: Abnormal   Collection Time: 05/17/19  4:48 PM  Result Value Ref Range   Glucose-Capillary 200 (H) 70 - 99 mg/dL  MRSA PCR Screening     Status: None   Collection Time: 05/17/19  6:50 PM   Specimen: Nasal Mucosa; Nasopharyngeal  Result Value Ref Range   MRSA by PCR NEGATIVE NEGATIVE    Comment:        The GeneXpert MRSA Assay (FDA approved for NASAL specimens only), is one component of a comprehensive MRSA colonization surveillance program. It is not intended to diagnose MRSA  infection nor to guide or monitor treatment for MRSA infections. Performed at [facility], [place] [address], [place], [place] [address]   CBC with Differential/Platelet     Status: Abnormal   Collection Time: 05/17/19  7:29 PM  Result Value Ref Range   WBC 17.6 (H) 4.0 - 10.5 K/uL   RBC 4.13 3.87 - 5.11 MIL/uL   Hemoglobin 12.2 12.0 - 15.0 g/dL   HCT 39.3 36.0 - 46.0 %   MCV 95.2 80.0 - 100.0 fL   MCH 29.5 26.0 - 34.0 pg   MCHC 31.0 30.0 - 36.0 g/dL   RDW 13.2 11.5 - 15.5 %   Platelets 236 150 - 400 K/uL   nRBC 0.0 0.0 - 0.2 %   Neutrophils Relative % 80 %   Neutro Abs 14.1 (H) 1.7 - 7.7 K/uL   Lymphocytes Relative 8 %   Lymphs Abs 1.5 0.7 - 4.0 K/uL   Monocytes Relative 11 %   Monocytes Absolute 1.9 (H) 0.1 - 1.0 K/uL   Eosinophils Relative 0 %   Eosinophils Absolute 0.0 0.0 - 0.5 K/uL   Basophils Relative 0 %   Basophils Absolute 0.0 0.0 - 0.1 K/uL   Immature Granulocytes 1 %   Abs Immature Granulocytes 0.10 (H) 0.00 - 0.07 K/uL    Comment: Performed at [facility], [place] [address], [place], [place] [address]  Comprehensive metabolic panel     Status: Abnormal   Collection  Time: 05/17/19  7:29 PM  Result Value Ref Range   Sodium 137 135 - 145 mmol/L   Potassium 4.1 3.5 - 5.1 mmol/L   Chloride 105 98 - 111 mmol/L   CO2 25 22 - 32 mmol/L   Glucose, Bld 192 (H) 70 - 99 mg/dL   BUN 19 8 - 23 mg/dL   Creatinine, Ser 1.04 (H) 0.44 - 1.00 mg/dL   Calcium 8.8 (L) 8.9 - 10.3 mg/dL   Total Protein 6.6 6.5 - 8.1 g/dL   Albumin 3.7 3.5 - 5.0 g/dL   AST 19 15 - 41 U/L   ALT 11 0 - 44 U/L   Alkaline Phosphatase 39 38 - 126 U/L   Total Bilirubin 0.9 0.3 - 1.2 mg/dL   GFR calc non Af Amer 48 (L) >60 mL/min   GFR calc Af Amer 56 (L) >60 mL/min   Anion gap 7 5 - 15    Comment: Performed at [facility], [place] [address] [place], [place] [address]  Magnesium     Status: None   Collection Time: 05/17/19  7:29 PM  Result Value Ref Range   Magnesium 2.1 1.7 - 2.4 mg/dL    Comment: Performed at [facility], [place] [address] [place], [place] [address]  Phosphorus     Status: None   Collection Time: 05/17/19  7:29 PM  Result Value Ref Range   Phosphorus 3.7 2.5 - 4.6 mg/dL    Comment: Performed at [facility], [place] [address] [place], [place] [address]  Troponin I - Now Then Q6H     Status: None   Collection Time: 05/17/19  7:29 PM  Result Value Ref Range   Troponin I <0.03 <0.03 ng/mL    Comment: Performed at [facility], [place] [address], [place], [place] [address]  Glucose, capillary     Status: Abnormal   Collection Time: 05/17/19 10:05 PM  Result Value Ref Range   Glucose-Capillary 138 (H) 70 -  99 mg/dL  CBC with Differential/Platelet     Status: Abnormal   Collection Time: 05/18/19 12:14 AM  Result Value Ref Range   WBC 15.3 (H) 4.0 - 10.5 K/uL   RBC 3.95 3.87 - 5.11 MIL/uL   Hemoglobin 11.6 (L) 12.0 - 15.0 g/dL   HCT 37.7 36.0 - 46.0 %   MCV 95.4 80.0 - 100.0 fL   MCH 29.4 26.0 - 34.0 pg   MCHC 30.8 30.0 - 36.0 g/dL   RDW 13.2 11.5 - 15.5 %   Platelets 224 150 - 400 K/uL   nRBC  0.0 0.0 - 0.2 %   Neutrophils Relative % 69 %   Neutro Abs 10.5 (H) 1.7 - 7.7 K/uL   Lymphocytes Relative 18 %   Lymphs Abs 2.7 0.7 - 4.0 K/uL   Monocytes Relative 13 %   Monocytes Absolute 1.9 (H) 0.1 - 1.0 K/uL   Eosinophils Relative 0 %   Eosinophils Absolute 0.1 0.0 - 0.5 K/uL   Basophils Relative 0 %   Basophils Absolute 0.0 0.0 - 0.1 K/uL   Immature Granulocytes 0 %   Abs Immature Granulocytes 0.05 0.00 - 0.07 K/uL    Comment: Performed at [facility], [place] [address], [place], [place] [address]  Comprehensive metabolic panel     Status: Abnormal   Collection Time: 05/18/19 12:14 AM  Result Value Ref Range   Sodium 136 135 - 145 mmol/L   Potassium 4.7 3.5 - 5.1 mmol/L   Chloride 105 98 - 111 mmol/L   CO2 25 22 - 32 mmol/L   Glucose, Bld 140 (H) 70 - 99 mg/dL   BUN 17 8 - 23 mg/dL   Creatinine, Ser 1.10 (H) 0.44 - 1.00 mg/dL   Calcium 8.5 (L) 8.9 - 10.3 mg/dL   Total Protein 5.9 (L) 6.5 - 8.1 g/dL   Albumin 3.2 (L) 3.5 - 5.0 g/dL   AST 18 15 - 41 U/L   ALT 12 0 - 44 U/L   Alkaline Phosphatase 37 (L) 38 - 126 U/L   Total Bilirubin 0.8 0.3 - 1.2 mg/dL   GFR calc non Af Amer 45 (L) >60 mL/min   GFR calc Af Amer 52 (L) >60 mL/min   Anion gap 6 5 - 15    Comment: Performed at [facility], [place] [address], [place], [place] [address]  Magnesium     Status: None   Collection Time: 05/18/19 12:14 AM  Result Value Ref Range   Magnesium 2.0 1.7 - 2.4 mg/dL    Comment: Performed at [facility], [place] [address], [place], [place] [address]  Phosphorus     Status: None   Collection Time: 05/18/19 12:14 AM  Result Value Ref Range   Phosphorus 3.9 2.5 - 4.6 mg/dL    Comment: Performed at [facility], [place] [address], [place], [place] [address]  Troponin I - Now Then Q6H     Status: None   Collection Time: 05/18/19 12:14 AM  Result Value Ref Range   Troponin I <0.03 <0.03 ng/mL    Comment: Performed at  [facility], [place] [address], [place],  [address]  Glucose, capillary     Status: Abnormal   Collection Time: 05/18/19  7:41 AM  Result Value Ref Range   Glucose-Capillary 105 (H) 70 - 99 mg/dL  Troponin I - Now Then Q6H     Status: None   Collection Time: 05/18/19  8:04  AM  Result Value Ref Range   Troponin I <0.03 <0.03 ng/mL    Comment: Performed at [facility], [place] [address], [place], [place] [address]  Glucose, capillary     Status: Abnormal   Collection Time: 05/18/19 11:48 AM  Result Value Ref Range   Glucose-Capillary 167 (H) 70 - 99 mg/dL  Glucose, capillary     Status: Abnormal   Collection Time: 05/18/19  4:16 PM  Result Value Ref Range   Glucose-Capillary 101 (H) 70 - 99 mg/dL  Glucose, capillary     Status: Abnormal   Collection Time: 05/18/19  9:16 PM  Result Value Ref Range   Glucose-Capillary 114 (H) 70 - 99 mg/dL  CBC with Differential/Platelet     Status: Abnormal   Collection Time: 05/19/19  6:03 AM  Result Value Ref Range   WBC 11.1 (H) 4.0 - 10.5 K/uL   RBC 3.64 (L) 3.87 - 5.11 MIL/uL   Hemoglobin 11.2 (L) 12.0 - 15.0 g/dL   HCT 34.6 (L) 36.0 - 46.0 %   MCV 95.1 80.0 - 100.0 fL   MCH 30.8 26.0 - 34.0 pg   MCHC 32.4 30.0 - 36.0 g/dL   RDW 13.3 11.5 - 15.5 %   Platelets 195 150 - 400 K/uL   nRBC 0.0 0.0 - 0.2 %   Neutrophils Relative % 51 %   Neutro Abs 5.8 1.7 - 7.7 K/uL   Lymphocytes Relative 30 %   Lymphs Abs 3.3 0.7 - 4.0 K/uL   Monocytes Relative 13 %   Monocytes Absolute 1.4 (H) 0.1 - 1.0 K/uL   Eosinophils Relative 5 %   Eosinophils Absolute 0.5 0.0 - 0.5 K/uL   Basophils Relative 0 %   Basophils Absolute 0.0 0.0 - 0.1 K/uL   Immature Granulocytes 1 %   Abs Immature Granulocytes 0.05 0.00 - 0.07 K/uL    Comment: Performed at [facility], [place] [address], [place], [place] [address]  Comprehensive metabolic panel     Status: Abnormal   Collection Time: 05/19/19  6:03 AM  Result  Value Ref Range   Sodium 138 135 - 145 mmol/L   Potassium 4.1 3.5 - 5.1 mmol/L   Chloride 108 98 - 111 mmol/L   CO2 24 22 - 32 mmol/L   Glucose, Bld 105 (H) 70 - 99 mg/dL   BUN 12 8 - 23 mg/dL   Creatinine, Ser 0.95 0.44 - 1.00 mg/dL   Calcium 8.6 (L) 8.9 - 10.3 mg/dL   Total Protein 6.1 (L) 6.5 - 8.1 g/dL   Albumin 3.2 (L) 3.5 - 5.0 g/dL   AST 20 15 - 41 U/L   ALT 11 0 - 44 U/L   Alkaline Phosphatase 34 (L) 38 - 126 U/L   Total Bilirubin 0.8 0.3 - 1.2 mg/dL   GFR calc non Af Amer 54 (L) >60 mL/min   GFR calc Af Amer >60 >60 mL/min   Anion gap 6 5 - 15    Comment: Performed at [facility], [place] [address] [place], [place] [address]  Magnesium     Status: None   Collection Time: 05/19/19  6:03 AM  Result Value Ref Range   Magnesium 2.1 1.7 - 2.4 mg/dL    Comment: Performed at [facility], [place] [address], [place], [place] [address]  Phosphorus     Status: Abnormal   Collection Time: 05/19/19  6:03 AM  Result Value Ref Range   Phosphorus  2.1 (L) 2.5 - 4.6 mg/dL    Comment: Performed at [facility], [place] [address], [place], [place] [address]  Vitamin B12     Status: None   Collection Time: 05/19/19  6:03 AM  Result Value Ref Range   Vitamin B-12 302 180 - 914 pg/mL    Comment: (NOTE) This assay is not validated for testing neonatal or myeloproliferative syndrome specimens for Vitamin B12 levels. Performed at [facility], [place] [address] [place], [place] [address]   Folate     Status: None   Collection Time: 05/19/19  6:03 AM  Result Value Ref Range   Folate 19.2 >5.9 ng/mL    Comment: Performed at [facility], [place] [address], [place], [place] [address]  Iron and TIBC     Status: Abnormal   Collection Time: 05/19/19  6:03 AM  Result Value Ref Range   Iron 33 28 - 170 ug/dL   TIBC 240 (L) 250 - 450 ug/dL   Saturation Ratios 14 10.4 - 31.8 %   UIBC 207 ug/dL    Comment:  Performed at [facility], [place] [address], [place], [place] [address]  Ferritin     Status: None   Collection Time: 05/19/19  6:03 AM  Result Value Ref Range   Ferritin 101 11 - 307 ng/mL    Comment: Performed at [facility], [place] [address], [place], [place] [address]  Reticulocytes     Status: Abnormal   Collection Time: 05/19/19  6:03 AM  Result Value Ref Range   Retic Ct Pct 1.1 0.4 - 3.1 %   RBC. 3.64 (L) 3.87 - 5.11 MIL/uL   Retic Count, Absolute 39.3 19.0 - 186.0 K/uL   Immature Retic Fract 7.9 2.3 - 15.9 %    Comment: Performed at [facility], [place] [address], [place], [place] [address]  Glucose, capillary     Status: Abnormal   Collection Time: 05/19/19  7:44 AM  Result Value Ref Range   Glucose-Capillary 105 (H) 70 - 99 mg/dL  Glucose, capillary     Status: Abnormal   Collection Time: 05/19/19 11:31 AM  Result Value Ref Range   Glucose-Capillary 127 (H) 70 - 99 mg/dL  Glucose, capillary     Status: Abnormal   Collection Time: 05/19/19  4:25 PM  Result Value Ref Range   Glucose-Capillary 137 (H) 70 - 99 mg/dL  Glucose, capillary     Status: Abnormal   Collection Time: 05/19/19  9:51 PM  Result Value Ref Range   Glucose-Capillary 118 (H) 70 - 99 mg/dL  Basic metabolic panel     Status: Abnormal   Collection Time: 05/20/19  5:23 AM  Result Value Ref Range   Sodium 139 135 - 145 mmol/L   Potassium 3.6 3.5 - 5.1 mmol/L   Chloride 107 98 - 111 mmol/L   CO2 25 22 - 32 mmol/L   Glucose, Bld 115 (H) 70 - 99 mg/dL   BUN 13 8 - 23 mg/dL   Creatinine, Ser 0.86 0.44 - 1.00 mg/dL   Calcium 8.5 (L) 8.9 - 10.3 mg/dL   GFR calc non Af Amer >60 >60 mL/min   GFR calc Af Amer >60 >60 mL/min   Anion gap 7 5 - 15    Comment: Performed at [facility], [place] [address] [place], [place] [address]  Magnesium     Status: None   Collection Time: 05/20/19  5:23 AM  Result Value Ref Range   Magnesium 1.9 1.7 - 2.4  mg/dL    Comment: Performed at [facility], [place] [address] [place], [place] [address]  Phosphorus     Status: None   Collection Time: 05/20/19  5:23 AM  Result Value Ref Range   Phosphorus 3.0 2.5 - 4.6 mg/dL    Comment: Performed at [facility], [place] [address], [place], [place] [address]  Glucose, capillary     Status: Abnormal   Collection Time: 05/20/19  7:36 AM  Result Value Ref Range   Glucose-Capillary 109 (H) 70 - 99 mg/dL   Comment 1 Notify RN   Glucose, capillary     Status: Abnormal   Collection Time: 05/20/19 12:03 PM  Result Value Ref Range   Glucose-Capillary 169 (H) 70 - 99 mg/dL   Comment 1 Notify RN    Comment 2 Document in Chart   Glucose, capillary     Status: Abnormal   Collection Time: 05/20/19  4:58 PM  Result Value Ref Range   Glucose-Capillary 119 (H) 70 - 99 mg/dL   Comment 1 Notify RN    Comment 2 Document in Chart   Glucose, capillary     Status: Abnormal   Collection Time: 05/20/19  8:39 PM  Result Value Ref Range   Glucose-Capillary 162 (H) 70 - 99 mg/dL  Basic metabolic panel     Status: Abnormal   Collection Time: 05/21/19  4:56 AM  Result Value Ref Range   Sodium 138 135 - 145 mmol/L   Potassium 3.6 3.5 - 5.1 mmol/L   Chloride 107 98 - 111 mmol/L   CO2 25 22 - 32 mmol/L   Glucose, Bld 118 (H) 70 - 99 mg/dL   BUN 14 8 - 23 mg/dL   Creatinine, Ser 0.84 0.44 - 1.00 mg/dL   Calcium 8.4 (L) 8.9 - 10.3 mg/dL   GFR calc non Af Amer >60 >60 mL/min   GFR calc Af Amer >60 >60 mL/min   Anion gap 6 5 - 15    Comment: Performed at [facility], [place] [address] [place], [place] [address]  Glucose, capillary     Status: Abnormal   Collection Time: 05/21/19  7:42 AM  Result Value Ref Range   Glucose-Capillary 120 (H) 70 - 99 mg/dL  Glucose, capillary     Status: Abnormal   Collection Time: 05/21/19 12:00 PM  Result Value Ref Range   Glucose-Capillary 157 (H) 70 - 99 mg/dL  Glucose, capillary      Status: Abnormal   Collection Time: 05/21/19  4:22 PM  Result Value Ref Range   Glucose-Capillary 152 (H) 70 - 99 mg/dL  Glucose, capillary     Status: Abnormal   Collection Time: 05/21/19  8:40 PM  Result Value Ref Range   Glucose-Capillary 117 (H) 70 - 99 mg/dL  Basic metabolic panel     Status: Abnormal   Collection Time: 05/22/19  5:23 AM  Result Value Ref Range   Sodium 140 135 - 145 mmol/L   Potassium 3.6 3.5 - 5.1 mmol/L   Chloride 107 98 - 111 mmol/L   CO2 24 22 - 32 mmol/L   Glucose, Bld 121 (H) 70 - 99 mg/dL   BUN 15 8 - 23 mg/dL   Creatinine, Ser 0.91 0.44 - 1.00 mg/dL   Calcium 8.7 (L) 8.9 - 10.3 mg/dL   GFR calc non Af Amer 57 (L) >60 mL/min  GFR calc Af Amer >60 >60 mL/min   Anion gap 9 5 - 15    Comment: Performed at [facility], [place] [address], [place], [place] [address]  CBC with Differential/Platelet     Status: Abnormal   Collection Time: 05/22/19  5:23 AM  Result Value Ref Range   WBC 10.5 4.0 - 10.5 K/uL   RBC 3.60 (L) 3.87 - 5.11 MIL/uL   Hemoglobin 11.0 (L) 12.0 - 15.0 g/dL   HCT 34.3 (L) 36.0 - 46.0 %   MCV 95.3 80.0 - 100.0 fL   MCH 30.6 26.0 - 34.0 pg   MCHC 32.1 30.0 - 36.0 g/dL   RDW 13.4 11.5 - 15.5 %   Platelets 227 150 - 400 K/uL   nRBC 0.0 0.0 - 0.2 %   Neutrophils Relative % 46 %   Neutro Abs 4.8 1.7 - 7.7 K/uL   Lymphocytes Relative 31 %   Lymphs Abs 3.2 0.7 - 4.0 K/uL   Monocytes Relative 12 %   Monocytes Absolute 1.2 (H) 0.1 - 1.0 K/uL   Eosinophils Relative 11 %   Eosinophils Absolute 1.2 (H) 0.0 - 0.5 K/uL   Basophils Relative 0 %   Basophils Absolute 0.0 0.0 - 0.1 K/uL   Immature Granulocytes 0 %   Abs Immature Granulocytes 0.03 0.00 - 0.07 K/uL    Comment: Performed at [facility], [place] [address] [place], [place] [address]  Magnesium     Status: None   Collection Time: 05/22/19  5:23 AM  Result Value Ref Range   Magnesium 2.0 1.7 - 2.4 mg/dL    Comment: Performed at [facility], [place] [address], [place], [place] [address]  Phosphorus     Status: None   Collection Time: 05/22/19  5:23 AM  Result Value Ref Range   Phosphorus 2.8 2.5 - 4.6 mg/dL    Comment: Performed at [facility], [place] [address], [place], [place] [address]  Glucose, capillary     Status: Abnormal   Collection Time: 05/22/19  7:27 AM  Result Value Ref Range   Glucose-Capillary 113 (H) 70 - 99 mg/dL  Glucose, capillary     Status: Abnormal   Collection Time: 05/22/19 12:04 PM  Result Value Ref Range   Glucose-Capillary 154 (H) 70 - 99 mg/dL  Glucose, capillary     Status: Abnormal   Collection Time: 05/22/19  4:28 PM  Result Value Ref Range   Glucose-Capillary 146 (H) 70 - 99 mg/dL  Glucose, capillary     Status: Abnormal   Collection Time: 05/22/19  9:32 PM  Result Value Ref Range   Glucose-Capillary 122 (H) 70 - 99 mg/dL   Comment 1 Notify RN    Comment 2 Document in Chart   Glucose, capillary     Status: Abnormal   Collection Time: 05/23/19  7:38 AM  Result Value Ref Range   Glucose-Capillary 120 (H) 70 - 99 mg/dL  Glucose, capillary     Status: Abnormal   Collection Time: 05/23/19 11:11 AM  Result Value Ref Range   Glucose-Capillary 124 (H) 70 - 99 mg/dL  CBC with Differential     Status: Abnormal   Collection Time: 06/02/19  8:09 PM  Result Value Ref Range   WBC 11.7 (H) 4.0 - 10.5 K/uL   RBC 4.03 3.87 - 5.11 MIL/uL   Hemoglobin 12.1 12.0 - 15.0 g/dL   HCT 37.4 36.0 - 46.0 %   MCV  92.8 80.0 - 100.0 fL   MCH 30.0 26.0 - 34.0 pg   MCHC 32.4 30.0 - 36.0 g/dL   RDW 13.7 11.5 - 15.5 %   Platelets 381 150 - 400 K/uL   nRBC 0.0 0.0 - 0.2 %   Neutrophils Relative % 46 %   Neutro Abs 5.4 1.7 - 7.7 K/uL   Lymphocytes Relative 31 %   Lymphs Abs 3.6 0.7 - 4.0 K/uL   Monocytes Relative 12 %   Monocytes Absolute 1.4 (H) 0.1 - 1.0 K/uL   Eosinophils Relative 10 %   Eosinophils Absolute 1.1 (H) 0.0 - 0.5 K/uL   Basophils Relative 1 %   Basophils  Absolute 0.1 0.0 - 0.1 K/uL   Immature Granulocytes 0 %   Abs Immature Granulocytes 0.03 0.00 - 0.07 K/uL    Comment: Performed at [facility], [place] [place], [place] [address]  Basic metabolic panel     Status: Abnormal   Collection Time: 06/02/19  8:09 PM  Result Value Ref Range   Sodium 138 135 - 145 mmol/L   Potassium 4.0 3.5 - 5.1 mmol/L   Chloride 105 98 - 111 mmol/L   CO2 25 22 - 32 mmol/L   Glucose, Bld 166 (H) 70 - 99 mg/dL   BUN 18 8 - 23 mg/dL   Creatinine, Ser 0.95 0.44 - 1.00 mg/dL   Calcium 9.4 8.9 - 10.3 mg/dL   GFR calc non Af Amer 54 (L) >60 mL/min   GFR calc Af Amer >60 >60 mL/min   Anion gap 8 5 - 15    Comment: Performed at [facility], [place] [place], [place] [address]  Troponin I - ONCE - STAT     Status: None   Collection Time: 06/02/19  8:09 PM  Result Value Ref Range   Troponin I <0.03 <0.03 ng/mL    Comment: Performed at [facility], [place] [place], [place] [address]   Objective  Body mass index is 33.16 kg/m. Wt Readings from Last 3 Encounters:  06/08/19 193 lb 3.2 oz (87.6 kg)  05/23/19 199 lb 8.3 oz (90.5 kg)  05/11/19 192 lb 8 oz (87.3 kg)   Temp Readings from Last 3 Encounters:  06/08/19 98 F (36.7 C) (Oral)  06/02/19 98.4 F (36.9 C) (Oral)  05/23/19 98.8 F (37.1 C) (Oral)   BP Readings from Last 3 Encounters:  06/08/19 134/70  06/03/19 (!) 156/85  05/23/19 (!) 142/61   Pulse Readings from Last 3 Encounters:  06/08/19 64  06/03/19 65  05/23/19 60    Physical Exam Vitals signs and nursing note reviewed.  Constitutional:      Appearance: Normal appearance. She is well-developed and well-groomed.  HENT:     Head: Normocephalic and atraumatic.     Comments: HOH    Nose: Nose normal.     Mouth/Throat:     Comments: Masked   Eyes:     Conjunctiva/sclera: Conjunctivae normal.     Pupils: Pupils are equal, round, and reactive to light.  Cardiovascular:     Rate and  Rhythm: Normal rate and regular rhythm.     Heart sounds: Normal heart sounds.     Comments: NSR 1 to 2+leg edema b/l  Pulmonary:     Effort: Pulmonary effort is normal.     Breath sounds: Normal breath sounds.  Abdominal:     Comments: Intact colostomy bag   Musculoskeletal:     Right lower  leg: 1+ Edema present.     Left lower leg: 1+ Edema present.  Skin:    General: Skin is warm and dry.  Neurological:     General: No focal deficit present.     Mental Status: She is alert and oriented to person, place, and time. Mental status is at baseline.     Gait: Gait normal.  Psychiatric:        Attention and Perception: Attention and perception normal.        Mood and Affect: Mood and affect normal.        Speech: Speech normal.        Behavior: Behavior normal. Behavior is cooperative.        Thought Content: Thought content normal.        Cognition and Memory: Cognition and memory normal.        Judgment: Judgment normal.     Assessment   1. DVT left lower ext daughter has noted b/l leg edema  [place] 06/02/19  IMPRESSION: Nonocclusive DVT within the left popliteal vein. There is occlusive thrombus within the posterior tibial and peroneal veins on the left. No DVT identified involving the right lower extremity.  2. 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 Afib with RVR noted with surgery unclear if having multiple episodes of Afib Chads2 score 3 5.9 % risk of event if no coumadin/anticoagulation  Reviewed stress test 02/24/19 EF 66%   4. DM 2 A1C 6.7 05/11/2019 metformin has given diarrhea in the past per daughter on amaryl 2 mg in am and tolerating   5.HTN controlled  6. Vitamin D def  7. Elevated WBC    8. Nocturnal hypoxia  9. HM Plan   1. Will try to get xarelto approved disc with pharmacist about c/w cost  Prn lasix for leg edema 20 mg qd x 3 days then prn  2.  Will disc with Dr. Margaretmary Eddy is this permanent  3. Refer to Dr. [name] likely needs echo/holter and to determine if needs anticoagulation long term  Currently on xarelto 4. Cont amaryl 2 mg qd tolerating  5. Cont meds  6. Cont D3 4000 IU qd  7. Repeat CBC in 2 weeks check UA and culture to w/u hematuria  8. W/u with echo and cards f/u if negative consider sleep study home  9.  Had flu shot utd utd prevnar pna 23 due 01/01/2019  Tdap and shingrix consider will disc in future again  MMR immune   Never had colonoscopy cologuard + 03/28/16 see chart declined colonoscopy s/p rectal mass excision due again 12/2019 No pap  mammo out of age window  dexa 01/21/14 normal   WBC 11.5 noted 05/31/18 labs   Reviewed CT ab/pelvis 02/11/18 b/l atrophic kidneys kidney right side and b/l kidney scarring, 1.3 cm gallstone, diverticulosis, aortic calcifications, MV calcification.   Time spent 25 minutes  Provider: Dr. Olivia Mackie McLean-Scocuzza-Internal Medicine

## 2019-06-08 NOTE — Patient Instructions (Addendum)
Take lasix 20 mg daily in am x 3 days then as needed    Atrial Fibrillation Atrial fibrillation is a type of irregular or rapid heartbeat (arrhythmia). In atrial fibrillation, the top part of the heart (atria) quivers in a chaotic pattern. This makes the heart unable to pump blood normally. Having atrial fibrillation can increase your risk for other health problems, such as:  Blood can pool in the atria and form clots. If a clot travels to the brain, it can cause a stroke.  The heart muscle may weaken from the irregular blood flow. This can cause heart failure. Atrial fibrillation may start suddenly and stop on its own, or it may become a long-lasting problem. What are the causes? This condition is caused by some heart-related conditions or procedures, including:  High blood pressure. This is the most common cause.  Heart failure.  Heart valve conditions.  Inflammation of the sac that surrounds the heart (pericarditis).  Heart surgery.  Coronary artery disease.  Certain heart rhythm disorders, such as Wolf-Parkinson-White syndrome. Other causes include:  Pneumonia.  Obstructive sleep apnea.  Lung cancer.  Thyroid problems, especially if the thyroid is overactive (hyperthyroidism).  Excessive alcohol or drug use. Sometimes, the cause of this condition is not known. What increases the risk? This condition is more likely to develop in:  Older people.  People who smoke.  People who have diabetes mellitus.  People who are overweight (obese).  Athletes who exercise vigorously.  People who have a family history. What are the signs or symptoms? Symptoms of this condition include:  A feeling that your heart is beating rapidly or irregularly.  A feeling of discomfort or pain in your chest.  Shortness of breath.  Sudden light-headedness or weakness.  Getting tired easily during exercise. In some cases, there are no symptoms. How is this diagnosed? Your health  care provider may be able to detect atrial fibrillation when taking your pulse. If detected, this condition may be diagnosed with:  Electrocardiogram (ECG).  Ambulatory cardiac monitor. This device records your heartbeats for 24 hours or more.  Transthoracic echocardiogram (TTE) to evaluate how blood flows through your heart.  Transesophageal echocardiogram (TEE) to view more detailed images of your heart.  A stress test.  Imaging tests, such as a CT scan or chest X-ray.  Blood tests. How is this treated? This condition may be treated with:  Medicines to slow down the heart rate or bring the heart's rhythm back to normal.  Medicines to prevent blood clots from forming.  Electrical cardioversion. This delivers a low-energy shock to the heart to reset its rhythm.  Ablation. This procedure destroys the part of the heart tissue that sends abnormal signals.  Left atrial appendage occlusion/excision. This seals off a common place in the atria where blood clots can form (left atrial appendage). The goal of treatment is to prevent blood clots from forming and to keep your heart beating at a normal rate and rhythm. Treatment depends on underlying medical conditions and how you feel when you are experiencing fibrillation. Follow these instructions at home: Medicines  Take over-the counter and prescription medicines only as told by your health care provider.  If your health care provider prescribed a blood-thinning medicine (anticoagulant), take it exactly as told. Taking too much blood-thinning medicine can cause bleeding. Taking too little can enable a blood clot to form and travel to the brain, causing a stroke. Lifestyle      Do not use any products that contain  nicotine or tobacco, such as cigarettes and e-cigarettes. If you need help quitting, ask your health care provider.  Do not drink beverages that contain caffeine, such as coffee, soda, and tea.  Follow diet instructions as  told by your health care provider.  Exercise regularly as told by your health care provider.  Do not drink alcohol. General instructions  If you have obstructive sleep apnea, manage your condition as told by your health care provider.  Maintain a healthy weight. Do not use diet pills unless your health care provider approves. Diet pills may make heart problems worse.  Keep all follow-up visits as told by your health care provider. This is important. Contact a health care provider if you:  Notice a change in the rate, rhythm, or strength of your heartbeat.  Are taking an anticoagulant and you notice increased bruising.  Tire more easily when you exercise or exert yourself.  Have a sudden change in weight. Get help right away if you have:   Chest pain, abdominal pain, sweating, or weakness.  Difficulty breathing.  Blood in your vomit, stool (feces), or urine.  Any symptoms of a stroke. "BE FAST" is an easy way to remember the main warning signs of a stroke: ? B - Balance. Signs are dizziness, sudden trouble walking, or loss of balance. ? E - Eyes. Signs are trouble seeing or a sudden change in vision. ? F - Face. Signs are sudden weakness or numbness of the face, or the face or eyelid drooping on one side. ? A - Arms. Signs are weakness or numbness in an arm. This happens suddenly and usually on one side of the body. ? S - Speech. Signs are sudden trouble speaking, slurred speech, or trouble understanding what people say. ? T - Time. Time to call emergency services. Write down what time symptoms started.  Other signs of a stroke, such as: ? A sudden, severe headache with no known cause. ? Nausea or vomiting. ? Seizure. These symptoms may represent a serious problem that is an emergency. Do not wait to see if the symptoms will go away. Get medical help right away. Call your local emergency services (911 in the [place] Do not drive yourself to the hospital. Summary  Atrial  fibrillation is a type of irregular or rapid heartbeat (arrhythmia).  Symptoms include a feeling that your heart is beating fast or irregularly. In some cases, you may not have symptoms.  The condition is treated with medicines to slow down the heart rate or bring the heart's rhythm back to normal. You may also need blood-thinning medicines to prevent blood clots.  Get help right away if you have symptoms or signs of a stroke. This information is not intended to replace advice given to you by your health care provider. Make sure you discuss any questions you have with your health care provider. Document Released: 12/14/2005 Document Revised: 02/04/2018 Document Reviewed: 02/04/2018 Elsevier Interactive Patient Education  2019 [employer] Inc.  Edema  Edema is an abnormal buildup of fluids in the body tissues and under the skin. Swelling of the legs, feet, and ankles is a common symptom that becomes more likely as you get older. Swelling is also common in looser tissues, like around the eyes. When the affected area is squeezed, the fluid may move out of that spot and leave a dent for a few moments. This dent is called pitting edema. There are many possible causes of edema. Eating too much salt (sodium) and being on  your feet or sitting for a long time can cause edema in your legs, feet, and ankles. Hot weather may make edema worse. Common causes of edema include:  Heart failure.  Liver or kidney disease.  Weak leg blood vessels.  Cancer.  An injury.  Pregnancy.  Medicines.  Being obese.  Low protein levels in the blood. Edema is usually painless. Your skin may look swollen or shiny. Follow these instructions at home:  Keep the affected body part raised (elevated) above the level of your heart when you are sitting or lying down.  Do not sit still or stand for long periods of time.  Do not wear tight clothing. Do not wear garters on your upper legs.  Exercise your legs to get  your circulation going. This helps to move the fluid back into your blood vessels, and it may help the swelling go down.  Wear elastic bandages or support stockings to reduce swelling as told by your health care provider.  Eat a low-salt (low-sodium) diet to reduce fluid as told by your health care provider.  Depending on the cause of your swelling, you may need to limit how much fluid you drink (fluid restriction).  Take over-the-counter and prescription medicines only as told by your health care provider. Contact a health care provider if:  Your edema does not get better with treatment.  You have heart, liver, or kidney disease and have symptoms of edema.  You have sudden and unexplained weight gain. Get help right away if:  You develop shortness of breath or chest pain.  You cannot breathe when you lie down.  You develop pain, redness, or warmth in the swollen areas.  You have heart, liver, or kidney disease and suddenly get edema.  You have a fever and your symptoms suddenly get worse. Summary  Edema is an abnormal buildup of fluids in the body tissues and under the skin.  Eating too much salt (sodium) and being on your feet or sitting for a long time can cause edema in your legs, feet, and ankles.  Keep the affected body part raised (elevated) above the level of your heart when you are sitting or lying down. This information is not intended to replace advice given to you by your health care provider. Make sure you discuss any questions you have with your health care provider. Document Released: 12/14/2005 Document Revised: 01/16/2017 Document Reviewed: 01/16/2017 Elsevier Interactive Patient Education  2019 [employer].

## 2019-06-08 NOTE — Telephone Encounter (Signed)
Any recs for stoma need to go to surgeon Dr. Annye English advise Caryl Comes  Mail Rx for walker   [place]

## 2019-06-09 ENCOUNTER — Encounter: Payer: Self-pay | Admitting: Internal Medicine

## 2019-06-09 DIAGNOSIS — I4891 Unspecified atrial fibrillation: Secondary | ICD-10-CM | POA: Insufficient documentation

## 2019-06-09 DIAGNOSIS — Z483 Aftercare following surgery for neoplasm: Secondary | ICD-10-CM | POA: Diagnosis not present

## 2019-06-09 DIAGNOSIS — E1122 Type 2 diabetes mellitus with diabetic chronic kidney disease: Secondary | ICD-10-CM | POA: Diagnosis not present

## 2019-06-09 DIAGNOSIS — K5790 Diverticulosis of intestine, part unspecified, without perforation or abscess without bleeding: Secondary | ICD-10-CM | POA: Diagnosis not present

## 2019-06-09 DIAGNOSIS — H9190 Unspecified hearing loss, unspecified ear: Secondary | ICD-10-CM | POA: Diagnosis not present

## 2019-06-09 DIAGNOSIS — Z9049 Acquired absence of other specified parts of digestive tract: Secondary | ICD-10-CM | POA: Diagnosis not present

## 2019-06-09 DIAGNOSIS — E559 Vitamin D deficiency, unspecified: Secondary | ICD-10-CM | POA: Insufficient documentation

## 2019-06-09 DIAGNOSIS — Z433 Encounter for attention to colostomy: Secondary | ICD-10-CM | POA: Diagnosis not present

## 2019-06-09 DIAGNOSIS — I129 Hypertensive chronic kidney disease with stage 1 through stage 4 chronic kidney disease, or unspecified chronic kidney disease: Secondary | ICD-10-CM | POA: Diagnosis not present

## 2019-06-09 DIAGNOSIS — G4700 Insomnia, unspecified: Secondary | ICD-10-CM | POA: Diagnosis not present

## 2019-06-09 DIAGNOSIS — R600 Localized edema: Secondary | ICD-10-CM | POA: Insufficient documentation

## 2019-06-09 DIAGNOSIS — D72829 Elevated white blood cell count, unspecified: Secondary | ICD-10-CM | POA: Insufficient documentation

## 2019-06-09 DIAGNOSIS — I824Z2 Acute embolism and thrombosis of unspecified deep veins of left distal lower extremity: Secondary | ICD-10-CM | POA: Insufficient documentation

## 2019-06-09 DIAGNOSIS — E785 Hyperlipidemia, unspecified: Secondary | ICD-10-CM | POA: Diagnosis not present

## 2019-06-09 DIAGNOSIS — N183 Chronic kidney disease, stage 3 (moderate): Secondary | ICD-10-CM | POA: Diagnosis not present

## 2019-06-09 NOTE — Telephone Encounter (Signed)
It has been mailed

## 2019-06-12 ENCOUNTER — Telehealth: Payer: Self-pay | Admitting: Pharmacist

## 2019-06-12 DIAGNOSIS — Z6830 Body mass index (BMI) 30.0-30.9, adult: Secondary | ICD-10-CM | POA: Diagnosis not present

## 2019-06-12 DIAGNOSIS — I4891 Unspecified atrial fibrillation: Secondary | ICD-10-CM | POA: Diagnosis not present

## 2019-06-12 DIAGNOSIS — E1122 Type 2 diabetes mellitus with diabetic chronic kidney disease: Secondary | ICD-10-CM | POA: Diagnosis not present

## 2019-06-12 DIAGNOSIS — I824Z2 Acute embolism and thrombosis of unspecified deep veins of left distal lower extremity: Secondary | ICD-10-CM | POA: Diagnosis not present

## 2019-06-12 DIAGNOSIS — E785 Hyperlipidemia, unspecified: Secondary | ICD-10-CM | POA: Diagnosis not present

## 2019-06-12 DIAGNOSIS — I10 Essential (primary) hypertension: Secondary | ICD-10-CM | POA: Diagnosis not present

## 2019-06-12 DIAGNOSIS — R0602 Shortness of breath: Secondary | ICD-10-CM | POA: Diagnosis not present

## 2019-06-12 DIAGNOSIS — N183 Chronic kidney disease, stage 3 (moderate): Secondary | ICD-10-CM | POA: Diagnosis not present

## 2019-06-12 DIAGNOSIS — E669 Obesity, unspecified: Secondary | ICD-10-CM | POA: Diagnosis not present

## 2019-06-12 DIAGNOSIS — I208 Other forms of angina pectoris: Secondary | ICD-10-CM | POA: Diagnosis not present

## 2019-06-12 NOTE — Telephone Encounter (Signed)
Received message from Dr. Terese Door noting that patient/her daughter expressed difficulty affording Xarelto (DVT tx).   Contacted patient's daughter, Jackelyn Poling (on [place]). Left message for her to return my call at her convenience.   Received return call back from patient's son. Discussed that Xarelto patient assistance has an income requirement, an out of pocket spend requirement, and will require copy of patient's 2019 tax return. He is going to find the 2019 tax return for proof of income, and find the patient's most recent Explanation of Benefits from [place] [employer] to determine current out of pocket spend total. Will need to be close to 4% of total income to qualify for Xarelto assistance.   If I do not hear back from Debbie/her husband, I will follow up again on Friday.   Catie Darnelle Maffucci, PharmD, [place] PGY2 Ambulatory Care Pharmacy Resident, [place] [facility] Phone: [phone]

## 2019-06-13 DIAGNOSIS — Z433 Encounter for attention to colostomy: Secondary | ICD-10-CM | POA: Diagnosis not present

## 2019-06-13 DIAGNOSIS — I129 Hypertensive chronic kidney disease with stage 1 through stage 4 chronic kidney disease, or unspecified chronic kidney disease: Secondary | ICD-10-CM | POA: Diagnosis not present

## 2019-06-13 DIAGNOSIS — Z9049 Acquired absence of other specified parts of digestive tract: Secondary | ICD-10-CM | POA: Diagnosis not present

## 2019-06-13 DIAGNOSIS — Z483 Aftercare following surgery for neoplasm: Secondary | ICD-10-CM | POA: Diagnosis not present

## 2019-06-13 DIAGNOSIS — E1122 Type 2 diabetes mellitus with diabetic chronic kidney disease: Secondary | ICD-10-CM | POA: Diagnosis not present

## 2019-06-13 DIAGNOSIS — H9190 Unspecified hearing loss, unspecified ear: Secondary | ICD-10-CM | POA: Diagnosis not present

## 2019-06-13 DIAGNOSIS — E785 Hyperlipidemia, unspecified: Secondary | ICD-10-CM | POA: Diagnosis not present

## 2019-06-13 DIAGNOSIS — K5790 Diverticulosis of intestine, part unspecified, without perforation or abscess without bleeding: Secondary | ICD-10-CM | POA: Diagnosis not present

## 2019-06-13 DIAGNOSIS — G4700 Insomnia, unspecified: Secondary | ICD-10-CM | POA: Diagnosis not present

## 2019-06-13 DIAGNOSIS — N183 Chronic kidney disease, stage 3 (moderate): Secondary | ICD-10-CM | POA: Diagnosis not present

## 2019-06-16 DIAGNOSIS — K5790 Diverticulosis of intestine, part unspecified, without perforation or abscess without bleeding: Secondary | ICD-10-CM | POA: Diagnosis not present

## 2019-06-16 DIAGNOSIS — G4700 Insomnia, unspecified: Secondary | ICD-10-CM | POA: Diagnosis not present

## 2019-06-16 DIAGNOSIS — H9190 Unspecified hearing loss, unspecified ear: Secondary | ICD-10-CM | POA: Diagnosis not present

## 2019-06-16 DIAGNOSIS — N183 Chronic kidney disease, stage 3 (moderate): Secondary | ICD-10-CM | POA: Diagnosis not present

## 2019-06-16 DIAGNOSIS — E1122 Type 2 diabetes mellitus with diabetic chronic kidney disease: Secondary | ICD-10-CM | POA: Diagnosis not present

## 2019-06-16 DIAGNOSIS — Z433 Encounter for attention to colostomy: Secondary | ICD-10-CM | POA: Diagnosis not present

## 2019-06-16 DIAGNOSIS — I129 Hypertensive chronic kidney disease with stage 1 through stage 4 chronic kidney disease, or unspecified chronic kidney disease: Secondary | ICD-10-CM | POA: Diagnosis not present

## 2019-06-16 DIAGNOSIS — Z483 Aftercare following surgery for neoplasm: Secondary | ICD-10-CM | POA: Diagnosis not present

## 2019-06-16 DIAGNOSIS — E785 Hyperlipidemia, unspecified: Secondary | ICD-10-CM | POA: Diagnosis not present

## 2019-06-16 DIAGNOSIS — Z9049 Acquired absence of other specified parts of digestive tract: Secondary | ICD-10-CM | POA: Diagnosis not present

## 2019-06-16 NOTE — Telephone Encounter (Signed)
Contacted patient's daughter, [name].   She noted that they saw cardiology, and they plan to treat with Xarelto for a total of 3 months. They discussed income with her, and unfortunately, the patient makes too much to qualify for [facility] patient assistance.    The patient paid >$300 for the first month, and cardiology provided the 1 month free supply coupon card for the second month. She was also able to get 1 week of samples from Dr. Terese Door and 1 month from cardiology, so she will only need 2 weeks of therapy. She plans to contact cardiology to see if they have any other samples.   I will attempt to make contact with a Xarelto rep to try to procure more samples for the clinic, but the patient's daughter is aware that this isn't a certainty. She will also investigate just buying 2 weeks instead of a full 30 days.   Will route to Dr. Terese Door for [place].  Catie Darnelle Maffucci, PharmD, [place] PGY2 Ambulatory Care Pharmacy Resident, [place] [facility] Phone: [phone]

## 2019-06-21 DIAGNOSIS — Z483 Aftercare following surgery for neoplasm: Secondary | ICD-10-CM | POA: Diagnosis not present

## 2019-06-21 DIAGNOSIS — Z433 Encounter for attention to colostomy: Secondary | ICD-10-CM | POA: Diagnosis not present

## 2019-06-21 DIAGNOSIS — H9190 Unspecified hearing loss, unspecified ear: Secondary | ICD-10-CM | POA: Diagnosis not present

## 2019-06-21 DIAGNOSIS — E785 Hyperlipidemia, unspecified: Secondary | ICD-10-CM | POA: Diagnosis not present

## 2019-06-21 DIAGNOSIS — E1122 Type 2 diabetes mellitus with diabetic chronic kidney disease: Secondary | ICD-10-CM | POA: Diagnosis not present

## 2019-06-21 DIAGNOSIS — G4700 Insomnia, unspecified: Secondary | ICD-10-CM | POA: Diagnosis not present

## 2019-06-21 DIAGNOSIS — K5790 Diverticulosis of intestine, part unspecified, without perforation or abscess without bleeding: Secondary | ICD-10-CM | POA: Diagnosis not present

## 2019-06-21 DIAGNOSIS — Z9049 Acquired absence of other specified parts of digestive tract: Secondary | ICD-10-CM | POA: Diagnosis not present

## 2019-06-21 DIAGNOSIS — I129 Hypertensive chronic kidney disease with stage 1 through stage 4 chronic kidney disease, or unspecified chronic kidney disease: Secondary | ICD-10-CM | POA: Diagnosis not present

## 2019-06-21 DIAGNOSIS — N183 Chronic kidney disease, stage 3 (moderate): Secondary | ICD-10-CM | POA: Diagnosis not present

## 2019-06-22 ENCOUNTER — Other Ambulatory Visit (INDEPENDENT_AMBULATORY_CARE_PROVIDER_SITE_OTHER): Payer: Medicare HMO

## 2019-06-22 ENCOUNTER — Other Ambulatory Visit: Payer: Self-pay

## 2019-06-22 DIAGNOSIS — D72829 Elevated white blood cell count, unspecified: Secondary | ICD-10-CM | POA: Diagnosis not present

## 2019-06-22 DIAGNOSIS — R319 Hematuria, unspecified: Secondary | ICD-10-CM | POA: Diagnosis not present

## 2019-06-22 LAB — CBC WITH DIFFERENTIAL/PLATELET
Basophils Absolute: 0.1 10*3/uL (ref 0.0–0.1)
Basophils Relative: 0.5 % (ref 0.0–3.0)
Eosinophils Absolute: 0.6 10*3/uL (ref 0.0–0.7)
Eosinophils Relative: 5.2 % — ABNORMAL HIGH (ref 0.0–5.0)
HCT: 37.2 % (ref 36.0–46.0)
Hemoglobin: 12.3 g/dL (ref 12.0–15.0)
Lymphocytes Relative: 36.1 % (ref 12.0–46.0)
Lymphs Abs: 4.4 10*3/uL — ABNORMAL HIGH (ref 0.7–4.0)
MCHC: 33 g/dL (ref 30.0–36.0)
MCV: 90.6 fl (ref 78.0–100.0)
Monocytes Absolute: 1.5 10*3/uL — ABNORMAL HIGH (ref 0.1–1.0)
Monocytes Relative: 12.6 % — ABNORMAL HIGH (ref 3.0–12.0)
Neutro Abs: 5.6 10*3/uL (ref 1.4–7.7)
Neutrophils Relative %: 45.6 % (ref 43.0–77.0)
Platelets: 246 10*3/uL (ref 150.0–400.0)
RBC: 4.1 Mil/uL (ref 3.87–5.11)
RDW: 14.2 % (ref 11.5–15.5)
WBC: 12.2 10*3/uL — ABNORMAL HIGH (ref 4.0–10.5)

## 2019-06-23 ENCOUNTER — Telehealth: Payer: Self-pay | Admitting: *Deleted

## 2019-06-23 DIAGNOSIS — H9190 Unspecified hearing loss, unspecified ear: Secondary | ICD-10-CM | POA: Diagnosis not present

## 2019-06-23 DIAGNOSIS — G4700 Insomnia, unspecified: Secondary | ICD-10-CM | POA: Diagnosis not present

## 2019-06-23 DIAGNOSIS — E785 Hyperlipidemia, unspecified: Secondary | ICD-10-CM | POA: Diagnosis not present

## 2019-06-23 DIAGNOSIS — K5790 Diverticulosis of intestine, part unspecified, without perforation or abscess without bleeding: Secondary | ICD-10-CM | POA: Diagnosis not present

## 2019-06-23 DIAGNOSIS — Z9049 Acquired absence of other specified parts of digestive tract: Secondary | ICD-10-CM | POA: Diagnosis not present

## 2019-06-23 DIAGNOSIS — Z483 Aftercare following surgery for neoplasm: Secondary | ICD-10-CM | POA: Diagnosis not present

## 2019-06-23 DIAGNOSIS — N183 Chronic kidney disease, stage 3 (moderate): Secondary | ICD-10-CM | POA: Diagnosis not present

## 2019-06-23 DIAGNOSIS — Z433 Encounter for attention to colostomy: Secondary | ICD-10-CM | POA: Diagnosis not present

## 2019-06-23 DIAGNOSIS — I129 Hypertensive chronic kidney disease with stage 1 through stage 4 chronic kidney disease, or unspecified chronic kidney disease: Secondary | ICD-10-CM | POA: Diagnosis not present

## 2019-06-23 DIAGNOSIS — E1122 Type 2 diabetes mellitus with diabetic chronic kidney disease: Secondary | ICD-10-CM | POA: Diagnosis not present

## 2019-06-23 LAB — URINALYSIS, ROUTINE W REFLEX MICROSCOPIC
Bacteria, UA: NONE SEEN /HPF
Bilirubin Urine: NEGATIVE
Glucose, UA: NEGATIVE
Hgb urine dipstick: NEGATIVE
Hyaline Cast: NONE SEEN /LPF
Ketones, ur: NEGATIVE
Nitrite: NEGATIVE
Protein, ur: NEGATIVE
Specific Gravity, Urine: 1.014 (ref 1.001–1.03)
pH: 5.5 (ref 5.0–8.0)

## 2019-06-23 LAB — URINE CULTURE
MICRO NUMBER:: 607534
SPECIMEN QUALITY:: ADEQUATE

## 2019-06-23 NOTE — Telephone Encounter (Signed)
Copied from [place] [id number]. Topic: Appointment Scheduling - Scheduling Inquiry for Clinic >> Jun 23, 2019 12:12 PM Mathis Bud wrote: Reason for CRM: patient daughter called to cancel appt for 7/1. Patient did not want to reschedule due to having an appt in Nov.

## 2019-06-24 DIAGNOSIS — Z9049 Acquired absence of other specified parts of digestive tract: Secondary | ICD-10-CM | POA: Diagnosis not present

## 2019-06-24 DIAGNOSIS — K5790 Diverticulosis of intestine, part unspecified, without perforation or abscess without bleeding: Secondary | ICD-10-CM | POA: Diagnosis not present

## 2019-06-24 DIAGNOSIS — H9190 Unspecified hearing loss, unspecified ear: Secondary | ICD-10-CM | POA: Diagnosis not present

## 2019-06-24 DIAGNOSIS — G4700 Insomnia, unspecified: Secondary | ICD-10-CM | POA: Diagnosis not present

## 2019-06-24 DIAGNOSIS — N183 Chronic kidney disease, stage 3 (moderate): Secondary | ICD-10-CM | POA: Diagnosis not present

## 2019-06-24 DIAGNOSIS — E1122 Type 2 diabetes mellitus with diabetic chronic kidney disease: Secondary | ICD-10-CM | POA: Diagnosis not present

## 2019-06-24 DIAGNOSIS — Z433 Encounter for attention to colostomy: Secondary | ICD-10-CM | POA: Diagnosis not present

## 2019-06-24 DIAGNOSIS — Z483 Aftercare following surgery for neoplasm: Secondary | ICD-10-CM | POA: Diagnosis not present

## 2019-06-24 DIAGNOSIS — E785 Hyperlipidemia, unspecified: Secondary | ICD-10-CM | POA: Diagnosis not present

## 2019-06-24 DIAGNOSIS — I129 Hypertensive chronic kidney disease with stage 1 through stage 4 chronic kidney disease, or unspecified chronic kidney disease: Secondary | ICD-10-CM | POA: Diagnosis not present

## 2019-06-28 ENCOUNTER — Ambulatory Visit: Payer: Medicare HMO | Admitting: Internal Medicine

## 2019-06-29 ENCOUNTER — Other Ambulatory Visit: Payer: Self-pay | Admitting: Internal Medicine

## 2019-07-19 DIAGNOSIS — K56609 Unspecified intestinal obstruction, unspecified as to partial versus complete obstruction: Secondary | ICD-10-CM | POA: Diagnosis not present

## 2019-07-19 DIAGNOSIS — Z933 Colostomy status: Secondary | ICD-10-CM | POA: Diagnosis not present

## 2019-07-25 ENCOUNTER — Ambulatory Visit: Payer: Medicare HMO | Admitting: Internal Medicine

## 2019-08-03 ENCOUNTER — Telehealth: Payer: Self-pay

## 2019-08-03 NOTE — Telephone Encounter (Signed)
I haven't faxed anything to [facility] so I'm not sure what that order number is.

## 2019-08-03 NOTE — Telephone Encounter (Signed)
Copied from [place] [id number]. Topic: General - Other >> Aug 03, 2019 10:58 AM Rainey Pines A wrote: Crystal from [facility] asked that the order number [id number] that was signed by Dr. Olivia Mackie  be faxed back over today  at [phone]

## 2019-08-11 ENCOUNTER — Other Ambulatory Visit: Payer: Self-pay | Admitting: Internal Medicine

## 2019-08-11 ENCOUNTER — Other Ambulatory Visit (HOSPITAL_COMMUNITY): Payer: Self-pay | Admitting: Internal Medicine

## 2019-08-11 DIAGNOSIS — R2243 Localized swelling, mass and lump, lower limb, bilateral: Secondary | ICD-10-CM

## 2019-08-14 ENCOUNTER — Ambulatory Visit
Admission: RE | Admit: 2019-08-14 | Discharge: 2019-08-14 | Disposition: A | Discharge status: Home / Self Care | Payer: Medicare HMO | Source: Ambulatory Visit | Attending: Internal Medicine | Admitting: Internal Medicine

## 2019-08-14 ENCOUNTER — Other Ambulatory Visit: Payer: Self-pay

## 2019-08-14 DIAGNOSIS — R2243 Localized swelling, mass and lump, lower limb, bilateral: Secondary | ICD-10-CM | POA: Diagnosis not present

## 2019-08-14 DIAGNOSIS — R600 Localized edema: Secondary | ICD-10-CM | POA: Diagnosis not present

## 2019-08-15 DIAGNOSIS — K56609 Unspecified intestinal obstruction, unspecified as to partial versus complete obstruction: Secondary | ICD-10-CM | POA: Diagnosis not present

## 2019-08-15 DIAGNOSIS — Z933 Colostomy status: Secondary | ICD-10-CM | POA: Diagnosis not present

## 2019-08-17 DIAGNOSIS — K56609 Unspecified intestinal obstruction, unspecified as to partial versus complete obstruction: Secondary | ICD-10-CM | POA: Diagnosis not present

## 2019-08-17 DIAGNOSIS — Z933 Colostomy status: Secondary | ICD-10-CM | POA: Diagnosis not present

## 2019-09-06 ENCOUNTER — Other Ambulatory Visit: Payer: Self-pay

## 2019-09-06 DIAGNOSIS — R600 Localized edema: Secondary | ICD-10-CM

## 2019-09-06 MED ORDER — FUROSEMIDE 20 MG PO TABS: 20.0000 mg | ORAL_TABLET | Freq: Every day | ORAL | 2 refills | Status: DC | PRN

## 2019-09-08 DIAGNOSIS — R69 Illness, unspecified: Secondary | ICD-10-CM | POA: Diagnosis not present

## 2019-09-14 ENCOUNTER — Ambulatory Visit: Payer: Medicare HMO | Admitting: Internal Medicine

## 2019-09-15 DIAGNOSIS — K56609 Unspecified intestinal obstruction, unspecified as to partial versus complete obstruction: Secondary | ICD-10-CM | POA: Diagnosis not present

## 2019-09-15 DIAGNOSIS — Z933 Colostomy status: Secondary | ICD-10-CM | POA: Diagnosis not present

## 2019-10-02 ENCOUNTER — Telehealth: Payer: Self-pay | Admitting: Internal Medicine

## 2019-10-02 NOTE — Telephone Encounter (Signed)
Daughter requesting 90 day supply lovastatin (MEVACOR) 20 MG tablet and glimepiride (AMARYL) 2 MG tablet, informed please allow 48 to 72 hour turn around time.    [place], [place] [phone] (Phone) [phone] (Fax)

## 2019-10-03 ENCOUNTER — Other Ambulatory Visit: Payer: Self-pay | Admitting: Internal Medicine

## 2019-10-03 DIAGNOSIS — I10 Essential (primary) hypertension: Secondary | ICD-10-CM

## 2019-10-03 DIAGNOSIS — E1122 Type 2 diabetes mellitus with diabetic chronic kidney disease: Secondary | ICD-10-CM

## 2019-10-03 DIAGNOSIS — N183 Chronic kidney disease, stage 3 unspecified: Secondary | ICD-10-CM

## 2019-10-03 MED ORDER — METOPROLOL SUCCINATE ER 100 MG PO TB24: 100.0000 mg | ORAL_TABLET | Freq: Every day | ORAL | 3 refills | Status: DC

## 2019-10-03 MED ORDER — GLIMEPIRIDE 2 MG PO TABS: 2.0000 mg | ORAL_TABLET | Freq: Every day | ORAL | 3 refills | Status: DC

## 2019-10-03 MED ORDER — LOVASTATIN 20 MG PO TABS: 20.0000 mg | ORAL_TABLET | Freq: Every day | ORAL | 3 refills | Status: DC

## 2019-10-31 ENCOUNTER — Other Ambulatory Visit: Payer: Self-pay

## 2019-11-01 ENCOUNTER — Encounter: Payer: Self-pay | Admitting: Internal Medicine

## 2019-11-01 ENCOUNTER — Telehealth: Payer: Self-pay | Admitting: Internal Medicine

## 2019-11-01 ENCOUNTER — Other Ambulatory Visit: Payer: Self-pay

## 2019-11-01 ENCOUNTER — Ambulatory Visit (INDEPENDENT_AMBULATORY_CARE_PROVIDER_SITE_OTHER): Payer: Medicare HMO | Admitting: Internal Medicine

## 2019-11-01 VITALS — BP 126/70 | HR 69 | Temp 97.8°F | Ht 64.0 in | Wt 195.4 lb

## 2019-11-01 DIAGNOSIS — E1122 Type 2 diabetes mellitus with diabetic chronic kidney disease: Secondary | ICD-10-CM

## 2019-11-01 DIAGNOSIS — G4700 Insomnia, unspecified: Secondary | ICD-10-CM

## 2019-11-01 DIAGNOSIS — Z1329 Encounter for screening for other suspected endocrine disorder: Secondary | ICD-10-CM

## 2019-11-01 DIAGNOSIS — N183 Chronic kidney disease, stage 3 unspecified: Secondary | ICD-10-CM

## 2019-11-01 DIAGNOSIS — E1165 Type 2 diabetes mellitus with hyperglycemia: Secondary | ICD-10-CM

## 2019-11-01 DIAGNOSIS — E559 Vitamin D deficiency, unspecified: Secondary | ICD-10-CM

## 2019-11-01 DIAGNOSIS — H6123 Impacted cerumen, bilateral: Secondary | ICD-10-CM | POA: Diagnosis not present

## 2019-11-01 DIAGNOSIS — I10 Essential (primary) hypertension: Secondary | ICD-10-CM | POA: Diagnosis not present

## 2019-11-01 DIAGNOSIS — H9193 Unspecified hearing loss, bilateral: Secondary | ICD-10-CM

## 2019-11-01 DIAGNOSIS — Z23 Encounter for immunization: Secondary | ICD-10-CM

## 2019-11-01 DIAGNOSIS — R609 Edema, unspecified: Secondary | ICD-10-CM

## 2019-11-01 MED ORDER — TRAZODONE HCL 50 MG PO TABS: 50.0000 mg | ORAL_TABLET | Freq: Every evening | ORAL | 3 refills | Status: DC | PRN

## 2019-11-01 MED ORDER — LOVASTATIN 20 MG PO TABS: 20.0000 mg | ORAL_TABLET | Freq: Every day | ORAL | 3 refills | Status: DC

## 2019-11-01 MED ORDER — LISINOPRIL 20 MG PO TABS: 20.0000 mg | ORAL_TABLET | Freq: Every day | ORAL | 3 refills | Status: DC

## 2019-11-01 MED ORDER — METOPROLOL SUCCINATE ER 100 MG PO TB24: 100.0000 mg | ORAL_TABLET | Freq: Every day | ORAL | 3 refills | Status: DC

## 2019-11-01 MED ORDER — GLIMEPIRIDE 2 MG PO TABS: 2.0000 mg | ORAL_TABLET | Freq: Every day | ORAL | 3 refills | Status: DC

## 2019-11-01 NOTE — Telephone Encounter (Signed)
Pt daughter is calling and her mother new pharm [facility]  not [facility]. Pt send rx's to [facility]. Pt has an appt with dr Olivia Mackie  [facility] now

## 2019-11-01 NOTE — Patient Instructions (Addendum)
Please schedule eye exam at some point but not urgent  We need to do fasting labs asap no food only water x 8 hours   Pneumonia 23 shot given today due every 5 years   Mild fluid in the legs I would take lasix 20 mg Thursday in the am for 3 days then as needed for leg swelling   Restrict salt intake   meds sent [facility]   Debrox ear wax drops 1x per month x 4-7 days lie on ear x 5 minutes on ear with drops in ear   Edema  Edema is an abnormal buildup of fluids in the body tissues and under the skin. Swelling of the legs, feet, and ankles is a common symptom that becomes more likely as you get older. Swelling is also common in looser tissues, like around the eyes. When the affected area is squeezed, the fluid may move out of that spot and leave a dent for a few moments. This dent is called pitting edema. There are many possible causes of edema. Eating too much salt (sodium) and being on your feet or sitting for a long time can cause edema in your legs, feet, and ankles. Hot weather may make edema worse. Common causes of edema include:  Heart failure.  Liver or kidney disease.  Weak leg blood vessels.  Cancer.  An injury.  Pregnancy.  Medicines.  Being obese.  Low protein levels in the blood. Edema is usually painless. Your skin may look swollen or shiny. Follow these instructions at home:  Keep the affected body part raised (elevated) above the level of your heart when you are sitting or lying down.  Do not sit still or stand for long periods of time.  Do not wear tight clothing. Do not wear garters on your upper legs.  Exercise your legs to get your circulation going. This helps to move the fluid back into your blood vessels, and it may help the swelling go down.  Wear elastic bandages or support stockings to reduce swelling as told by your health care provider.  Eat a low-salt (low-sodium) diet to reduce fluid as told by your health care  provider.  Depending on the cause of your swelling, you may need to limit how much fluid you drink (fluid restriction).  Take over-the-counter and prescription medicines only as told by your health care provider. Contact a health care provider if:  Your edema does not get better with treatment.  You have heart, liver, or kidney disease and have symptoms of edema.  You have sudden and unexplained weight gain. Get help right away if:  You develop shortness of breath or chest pain.  You cannot breathe when you lie down.  You develop pain, redness, or warmth in the swollen areas.  You have heart, liver, or kidney disease and suddenly get edema.  You have a fever and your symptoms suddenly get worse. Summary  Edema is an abnormal buildup of fluids in the body tissues and under the skin.  Eating too much salt (sodium) and being on your feet or sitting for a long time can cause edema in your legs, feet, and ankles.  Keep the affected body part raised (elevated) above the level of your heart when you are sitting or lying down. This information is not intended to replace advice given to you by your health care provider. Make sure you discuss any questions you have with your health care provider. Document Released: 12/14/2005 Document Revised: 12/17/2017  Document Reviewed: 01/16/2017 Elsevier Patient Education  [place].   Pneumococcal Vaccine, Polyvalent solution for injection What is this medicine? PNEUMOCOCCAL VACCINE, POLYVALENT (NEU mo KOK al vak SEEN, pol ee VEY luhnt) is a vaccine to prevent pneumococcus bacteria infection. These bacteria are a major cause of ear infections, Strep throat infections, and serious pneumonia, meningitis, or blood infections worldwide. These vaccines help the body to produce antibodies (protective substances) that help your body defend against these bacteria. This vaccine is recommended for people 33 years of age and older with health problems.  It is also recommended for all adults over 68 years old. This vaccine will not treat an infection. This medicine may be used for other purposes; ask your health care provider or pharmacist if you have questions. COMMON BRAND NAME(S): Pneumovax 23 What should I tell my health care provider before I take this medicine? They need to know if you have any of these conditions:  bleeding problems  bone marrow or organ transplant  cancer, Hodgkin's disease  fever  infection  immune system problems  low platelet count in the blood  seizures  an unusual or allergic reaction to pneumococcal vaccine, diphtheria toxoid, other vaccines, latex, other medicines, foods, dyes, or preservatives  pregnant or trying to get pregnant  breast-feeding How should I use this medicine? This vaccine is for injection into a muscle or under the skin. It is given by a health care professional. A copy of Vaccine Information Statements will be given before each vaccination. Read this sheet carefully each time. The sheet may change frequently. Talk to your pediatrician regarding the use of this medicine in children. While this drug may be prescribed for children as young as 56 years of age for selected conditions, precautions do apply. Overdosage: If you think you have taken too much of this medicine contact a poison control center or emergency room at once. NOTE: This medicine is only for you. Do not share this medicine with others. What if I miss a dose? It is important not to miss your dose. Call your doctor or health care professional if you are unable to keep an appointment. What may interact with this medicine?  medicines for cancer chemotherapy  medicines that suppress your immune function  medicines that treat or prevent blood clots like warfarin, enoxaparin, and dalteparin  steroid medicines like prednisone or cortisone This list may not describe all possible interactions. Give your health care  provider a list of all the medicines, herbs, non-prescription drugs, or dietary supplements you use. Also tell them if you smoke, drink alcohol, or use illegal drugs. Some items may interact with your medicine. What should I watch for while using this medicine? Mild fever and pain should go away in 3 days or less. Report any unusual symptoms to your doctor or health care professional. What side effects may I notice from receiving this medicine? Side effects that you should report to your doctor or health care professional as soon as possible:  allergic reactions like skin rash, itching or hives, swelling of the face, lips, or tongue  breathing problems  confused  fever over 102 degrees F  pain, tingling, numbness in the hands or feet  seizures  unusual bleeding or bruising  unusual muscle weakness Side effects that usually do not require medical attention (report to your doctor or health care professional if they continue or are bothersome):  aches and pains  diarrhea  fever of 102 degrees F or less  headache  irritable  loss of appetite  pain, tender at site where injected  trouble sleeping This list may not describe all possible side effects. Call your doctor for medical advice about side effects. You may report side effects to FDA at 1-800-FDA-1088. Where should I keep my medicine? This does not apply. This vaccine is given in a clinic, pharmacy, doctor's office, or other health care setting and will not be stored at home. NOTE: This sheet is a summary. It may not cover all possible information. If you have questions about this medicine, talk to your doctor, pharmacist, or health care provider.  2020 Elsevier/Gold Standard (2008-07-20 14:32:37)

## 2019-11-01 NOTE — Progress Notes (Signed)
Chief Complaint  Patient presents with  . Follow-up   F/u  1. HTN on lis 20 mg qd and toprol 100 mg qd and BP contorlled  2. DM 2 last A1C 6.7 on amaryl 2 mg qd 3. DVT left leg 07/2019 repeat US DVT negative repeat    Review of Systems  Constitutional: Negative for weight loss.  HENT: Positive for hearing loss.   Eyes: Negative for blurred vision.  Respiratory: Negative for shortness of breath.   Cardiovascular: Negative for chest pain.  Gastrointestinal: Negative for abdominal pain.  Musculoskeletal: Negative for falls.  Skin: Negative for rash.  Neurological: Negative for headaches.  Psychiatric/Behavioral: Negative for depression.   Past Medical History:  Diagnosis Date  . Diabetes mellitus without complication ([place])   . DVT (deep venous thrombosis) ([place])    06/02/2019  . Hard of hearing   . Hypertension    Past Surgical History:  Procedure Laterality Date  . CESAREAN SECTION    . COLONOSCOPY N/A 05/17/2019   Procedure: FLEXIBLE SIGMOIDOSCOPY;  Surgeon: Ileana Roup, MD;  Location: [facility];  Service: General;  Laterality: N/A;  . COLONOSCOPY WITH PROPOFOL N/A 01/09/2019   Procedure: COLONOSCOPY WITH PROPOFOL;  Surgeon: Lin Landsman, MD;  Location: [facility] ENDOSCOPY;  Service: Gastroenterology;  Laterality: N/A;  . ESOPHAGOGASTRODUODENOSCOPY (EGD) WITH PROPOFOL N/A 01/09/2019   Procedure: ESOPHAGOGASTRODUODENOSCOPY (EGD) WITH PROPOFOL;  Surgeon: Lin Landsman, MD;  Location: [facility] END[facility]COPY;  Service: Gastroenterology;  Laterality: N/A;  . KNEE SURGERY     left 2017/2018  . SHOULDER SURGERY     right  . XI ROBOTIC ASSISTED LOWER ANTERIOR RESECTION N/A 05/17/2019   Procedure: XI ROBOTIC ASSISTED LOWER ANTERIOR RESECTION WITH END COLOSTOMY;  Surgeon: Ileana Roup, MD;  Location: [facility]S;  Service: General;  Laterality: N/A;   No family history on file. Social History   Socioeconomic History  . Marital status: Widowed    Spouse name: Not on file  .  Number of children: Not on file  . Years of education: Not on file  . Highest education level: Not on file  Occupational History  . Not on file  Social Needs  . Financial resource strain: Not on file  . Food insecurity    Worry: Not on file    Inability: Not on file  . Transportation needs    Medical: Not on file    Non-medical: Not on file  Tobacco Use  . Smoking status: Former Smoker    Years: 4.00  . Smokeless tobacco: Never Used  . Tobacco comment: 3 per day  Substance and Sexual Activity  . Alcohol use: No    Frequency: Never  . Drug use: No  . Sexual activity: Not on file  Lifestyle  . Physical activity    Days per week: Not on file    Minutes per session: Not on file  . Stress: Not on file  Relationships  . Social Herbalist on phone: Not on file    Gets together: Not on file    Attends religious service: Not on file    Active member of club or organization: Not on file    Attends meetings of clubs or organizations: Not on file    Relationship status: Not on file  . Intimate partner violence    Fear of current or ex partner: Not on file    Emotionally abused: Not on file    Physically abused: Not on file  Forced sexual activity: Not on file  Other Topics Concern  . Not on file  Social History Narrative   Retired Pharmacist, hospital    2 daughters lives with [name]    1 son deceased    Widowed    Moved from [place]   Current Meds  Medication Sig  . aspirin 81 MG chewable tablet Chew 81 mg by mouth daily.  . Cholecalciferol 100 MCG (4000 UT) CAPS Take by mouth.  . Cholecalciferol 100 MCG (4000 UT) CAPS Take by mouth.  . furosemide (LASIX) 20 MG tablet Take 1 tablet (20 mg total) by mouth daily as needed.  Marland Kitchen glimepiride (AMARYL) 2 MG tablet Take 1 tablet (2 mg total) by mouth daily with breakfast.  . lisinopril (ZESTRIL) 20 MG tablet Take 1 tablet (20 mg total) by mouth daily. Do not cut in 1/2 dose increased dose to 20 mg  . lovastatin (MEVACOR) 20 MG tablet  Take 1 tablet (20 mg total) by mouth daily at 6 PM.  . metoprolol succinate (TOPROL-XL) 100 MG 24 hr tablet Take 1 tablet (100 mg total) by mouth daily. Take with or immediately following a meal.  . [DISCONTINUED] glimepiride (AMARYL) 2 MG tablet Take 1 tablet (2 mg total) by mouth daily with breakfast.  . [DISCONTINUED] lisinopril (ZESTRIL) 20 MG tablet Take 1 tablet (20 mg total) by mouth daily. Do not cut in 1/2 dose increased dose to 20 mg  . [DISCONTINUED] lovastatin (MEVACOR) 20 MG tablet Take 1 tablet (20 mg total) by mouth daily at 6 PM.  . [DISCONTINUED] metoprolol succinate (TOPROL-XL) 100 MG 24 hr tablet Take 1 tablet (100 mg total) by mouth daily. Take with or immediately following a meal.  . [DISCONTINUED] Rivaroxaban 15 & 20 MG TBPK Take as directed on package: Start with one 34m tablet by mouth twice a day with food. On Day 22, switch to one 240mtablet once a day with food.   Allergies  Allergen Reactions  . Metformin And Related     Diarrhea    No results found for this or any previous visit (from the past 2160 hour(s)). Objective  Body mass index is 33.54 kg/m. Wt Readings from Last 3 Encounters:  11/01/19 195 lb 6.4 oz (88.6 kg)  06/08/19 193 lb 3.2 oz (87.6 kg)  05/23/19 199 lb 8.3 oz (90.5 kg)   Temp Readings from Last 3 Encounters:  11/01/19 97.8 F (36.6 C) (Skin)  06/08/19 98 F (36.7 C) (Oral)  06/02/19 98.4 F (36.9 C) (Oral)   BP Readings from Last 3 Encounters:  11/01/19 126/70  06/08/19 134/70  06/03/19 (!) 156/85   Pulse Readings from Last 3 Encounters:  11/01/19 69  06/08/19 64  06/03/19 65    Physical Exam Vitals signs and nursing note reviewed.  Constitutional:      Appearance: Normal appearance. She is well-developed and well-groomed. She is obese.     Comments: +mask on    HENT:     Head: Normocephalic and atraumatic.     Comments: B/l cerumen impaction   Eyes:     Conjunctiva/sclera: Conjunctivae normal.     Pupils: Pupils are  equal, round, and reactive to light.  Cardiovascular:     Rate and Rhythm: Normal rate and regular rhythm.     Heart sounds: Normal heart sounds. No murmur.  Pulmonary:     Effort: Pulmonary effort is normal.     Breath sounds: Normal breath sounds.  Musculoskeletal:     Right lower leg:  1+ Edema present.     Left lower leg: 1+ Edema present.  Skin:    General: Skin is warm and dry.  Neurological:     General: No focal deficit present.     Mental Status: She is alert and oriented to person, place, and time. Mental status is at baseline.     Gait: Gait normal.  Psychiatric:        Attention and Perception: Attention and perception normal.        Mood and Affect: Mood and affect normal.        Speech: Speech normal.        Behavior: Behavior normal. Behavior is cooperative.        Thought Content: Thought content normal.        Cognition and Memory: Cognition and memory normal.        Judgment: Judgment normal.     Assessment  Plan  Type 2 diabetes mellitus with hyperglycemia, without long-term current use of insulin (HCC) - Plan: Microalbumin / creatinine urine ratio, Hemoglobin A1c  Type 2 diabetes mellitus with stage 3 chronic kidney disease, without long-term current use of insulin (HCC) - Plan: lovastatin (MEVACOR) 20 MG tablet, lisinopril (ZESTRIL) 20 MG tablet, glimepiride (AMARYL) 2 MG tablet rec eye exam  Will need to do foot exam at f/u   Hypertension, unspecified type - Plan: metoprolol succinate (TOPROL-XL) 100 MG 24 hr tablet cont lis 20 mg   Essential hypertension - Plan: metoprolol succinate (TOPROL-XL) 100 MG 24 hr tablet, lisinopril (ZESTRIL) 20 MG tablet, Comprehensive metabolic panel, Lipid panel, Hemoglobin A1c, CBC with Differential/Platelet  Insomnia, unspecified type - Plan: traZODone (DESYREL) 50 MG tablet qhs prn   Bilateral impacted cerumen-removed with currette and lavage pt tolerate right ear worse than left all wax out  Bilateral hearing loss,  unspecified hearing loss type   1+ b/l leg edema  rec lasix 20 mg x 3 days and daily prn  Consider echo at f/u if continues   HM Fasting labs asap  Had flu shot utd utd prevnar  pna 23 given today 11/01/2019   Tdap and shingrix considerwill disc in future again MMR immune   Never had colonoscopy cologuard + 4/1/17see chartdeclined colonoscopy s/p rectal mass excision due again 12/2019; left colostomy bag long term  No pap  mammo out of age window  dexa 01/21/14 normal  WBC 11.5 noted 05/31/18 labs   Reviewed CT ab/pelvis 02/11/18 b/l atrophic kidneys kidney right side and b/l kidney scarring, 1.3 cm gallstone, diverticulosis, aortic calcifications, MV calcification.   Provider: Dr. Olivia Mackie [facility]

## 2019-11-02 ENCOUNTER — Other Ambulatory Visit: Payer: Self-pay | Admitting: Internal Medicine

## 2019-11-02 DIAGNOSIS — G4700 Insomnia, unspecified: Secondary | ICD-10-CM

## 2019-11-02 DIAGNOSIS — E1122 Type 2 diabetes mellitus with diabetic chronic kidney disease: Secondary | ICD-10-CM

## 2019-11-02 DIAGNOSIS — R600 Localized edema: Secondary | ICD-10-CM

## 2019-11-02 DIAGNOSIS — I10 Essential (primary) hypertension: Secondary | ICD-10-CM

## 2019-11-02 LAB — MICROALBUMIN / CREATININE URINE RATIO
Creatinine, Urine: 80 mg/dL (ref 20–275)
Microalb Creat Ratio: 14 mcg/mg creat (ref <30)
Microalb, Ur: 1.1 mg/dL

## 2019-11-02 MED ORDER — TRAZODONE HCL 50 MG PO TABS: 50.0000 mg | ORAL_TABLET | Freq: Every evening | ORAL | 3 refills | Status: DC | PRN

## 2019-11-02 MED ORDER — LISINOPRIL 20 MG PO TABS: 20.0000 mg | ORAL_TABLET | Freq: Every day | ORAL | 3 refills | Status: DC

## 2019-11-02 MED ORDER — GLIMEPIRIDE 2 MG PO TABS: 2.0000 mg | ORAL_TABLET | Freq: Every day | ORAL | 3 refills | Status: DC

## 2019-11-02 MED ORDER — LOVASTATIN 20 MG PO TABS: 20.0000 mg | ORAL_TABLET | Freq: Every day | ORAL | 3 refills | Status: DC

## 2019-11-02 MED ORDER — FUROSEMIDE 20 MG PO TABS: 20.0000 mg | ORAL_TABLET | Freq: Every day | ORAL | 3 refills | Status: DC | PRN

## 2019-11-02 MED ORDER — METOPROLOL SUCCINATE ER 100 MG PO TB24: 100.0000 mg | ORAL_TABLET | Freq: Every day | ORAL | 3 refills | Status: DC

## 2019-11-09 ENCOUNTER — Other Ambulatory Visit: Payer: Self-pay

## 2019-11-13 ENCOUNTER — Other Ambulatory Visit: Payer: Self-pay

## 2019-11-13 ENCOUNTER — Encounter: Payer: Self-pay | Admitting: *Deleted

## 2019-11-13 ENCOUNTER — Other Ambulatory Visit (INDEPENDENT_AMBULATORY_CARE_PROVIDER_SITE_OTHER): Payer: Medicare HMO

## 2019-11-13 DIAGNOSIS — E559 Vitamin D deficiency, unspecified: Secondary | ICD-10-CM | POA: Diagnosis not present

## 2019-11-13 DIAGNOSIS — I10 Essential (primary) hypertension: Secondary | ICD-10-CM | POA: Diagnosis not present

## 2019-11-13 DIAGNOSIS — E1165 Type 2 diabetes mellitus with hyperglycemia: Secondary | ICD-10-CM | POA: Diagnosis not present

## 2019-11-13 DIAGNOSIS — Z1329 Encounter for screening for other suspected endocrine disorder: Secondary | ICD-10-CM | POA: Diagnosis not present

## 2019-11-13 LAB — COMPREHENSIVE METABOLIC PANEL
ALT: 10 U/L (ref 0–35)
AST: 15 U/L (ref 0–37)
Albumin: 4 g/dL (ref 3.5–5.2)
Alkaline Phosphatase: 43 U/L (ref 39–117)
BUN: 22 mg/dL (ref 6–23)
CO2: 27 mEq/L (ref 19–32)
Calcium: 9.9 mg/dL (ref 8.4–10.5)
Chloride: 105 mEq/L (ref 96–112)
Creatinine, Ser: 1.05 mg/dL (ref 0.40–1.20)
GFR: 49.47 mL/min — ABNORMAL LOW (ref >60.00)
Glucose, Bld: 109 mg/dL — ABNORMAL HIGH (ref 70–99)
Potassium: 4.5 mEq/L (ref 3.5–5.1)
Sodium: 140 mEq/L (ref 135–145)
Total Bilirubin: 0.6 mg/dL (ref 0.2–1.2)
Total Protein: 7 g/dL (ref 6.0–8.3)

## 2019-11-13 LAB — LIPID PANEL
Cholesterol: 128 mg/dL (ref 0–200)
HDL: 39.7 mg/dL (ref >39.00)
LDL Cholesterol: 66 mg/dL (ref 0–99)
NonHDL: 88.18
Total CHOL/HDL Ratio: 3
Triglycerides: 113 mg/dL (ref 0.0–149.0)
VLDL: 22.6 mg/dL (ref 0.0–40.0)

## 2019-11-13 LAB — TSH: TSH: 3.7 u[IU]/mL (ref 0.35–4.50)

## 2019-11-13 LAB — VITAMIN D 25 HYDROXY (VIT D DEFICIENCY, FRACTURES): VITD: 66.47 ng/mL (ref 30.00–100.00)

## 2019-11-13 LAB — CBC WITH DIFFERENTIAL/PLATELET
Basophils Absolute: 0 10*3/uL (ref 0.0–0.1)
Basophils Relative: 0.4 % (ref 0.0–3.0)
Eosinophils Absolute: 0.7 10*3/uL (ref 0.0–0.7)
Eosinophils Relative: 6.3 % — ABNORMAL HIGH (ref 0.0–5.0)
HCT: 39.2 % (ref 36.0–46.0)
Hemoglobin: 13 g/dL (ref 12.0–15.0)
Lymphocytes Relative: 39.5 % (ref 12.0–46.0)
Lymphs Abs: 4.3 10*3/uL — ABNORMAL HIGH (ref 0.7–4.0)
MCHC: 33.1 g/dL (ref 30.0–36.0)
MCV: 90.4 fl (ref 78.0–100.0)
Monocytes Absolute: 1.5 10*3/uL — ABNORMAL HIGH (ref 0.1–1.0)
Monocytes Relative: 13.7 % — ABNORMAL HIGH (ref 3.0–12.0)
Neutro Abs: 4.4 10*3/uL (ref 1.4–7.7)
Neutrophils Relative %: 40.1 % — ABNORMAL LOW (ref 43.0–77.0)
Platelets: 259 10*3/uL (ref 150.0–400.0)
RBC: 4.33 Mil/uL (ref 3.87–5.11)
RDW: 13.7 % (ref 11.5–15.5)
WBC: 11 10*3/uL — ABNORMAL HIGH (ref 4.0–10.5)

## 2019-11-13 LAB — HEMOGLOBIN A1C: Hgb A1c MFr Bld: 6.8 % — ABNORMAL HIGH (ref 4.6–6.5)

## 2019-11-16 ENCOUNTER — Ambulatory Visit (INDEPENDENT_AMBULATORY_CARE_PROVIDER_SITE_OTHER): Payer: Medicare HMO

## 2019-11-16 ENCOUNTER — Other Ambulatory Visit: Payer: Self-pay

## 2019-11-16 DIAGNOSIS — Z0000 Encounter for general adult medical examination without abnormal findings: Secondary | ICD-10-CM | POA: Diagnosis not present

## 2019-11-16 DIAGNOSIS — K56609 Unspecified intestinal obstruction, unspecified as to partial versus complete obstruction: Secondary | ICD-10-CM | POA: Diagnosis not present

## 2019-11-16 DIAGNOSIS — Z933 Colostomy status: Secondary | ICD-10-CM | POA: Diagnosis not present

## 2019-11-16 NOTE — Patient Instructions (Addendum)
  [name] , Thank you for taking time to come for your Medicare Wellness Visit. I appreciate your ongoing commitment to your health goals. Please review the following plan we discussed and let me know if I can assist you in the future.   These are the goals we discussed: Goals    . Follow up with Primary Care Provider     As needed       This is a list of the screening recommended for you and due dates:  Health Maintenance  Topic Date Due  . Complete foot exam   12/03/1939  . Eye exam for diabetics  12/03/1939  . Tetanus Vaccine  12/03/1950  . DEXA scan (bone density measurement)  12/03/1996  . Hemoglobin A1C  05/12/2020  . Colon Cancer Screening  05/16/2020  . Flu Shot  Completed  . Pneumonia vaccines  Completed

## 2019-11-16 NOTE — Progress Notes (Signed)
Subjective:   Heidi Whitaker is a 85 y.o. female who presents for an Initial Medicare Annual Wellness Visit.  Review of Systems    No ROS.  Medicare Wellness Virtual Visit.  Visual/audio telehealth visit, UTA vital signs.   See social history for additional risk factors.    Cardiac Risk Factors include: advanced age (>22men, >34 women);hypertension;diabetes mellitus     Objective:    Today's Vitals   There is no height or weight on file to calculate BMI.  Advanced Directives 11/16/2019 06/02/2019 05/18/2019 05/11/2019 03/13/2019 01/09/2019 12/20/2017  Does Patient Have a Medical Advance Directive? No No No No No Yes Yes  Type of Advance Directive - - - - - Living will Healthcare Power of Attorney  Does patient want to make changes to medical advance directive? - - - - - - Yes (Inpatient - patient defers changing a medical advance directive at this time)  Would patient like information on creating a medical advance directive? No - Patient declined - No - Patient declined - Yes (MAU/Ambulatory/Procedural Areas - Information given) - No - Patient declined    Current Medications (verified) Outpatient Encounter Medications as of 11/16/2019  Medication Sig  . aspirin 81 MG chewable tablet Chew 81 mg by mouth daily.  . Cholecalciferol 100 MCG (4000 UT) CAPS Take by mouth.  . Cholecalciferol 100 MCG (4000 UT) CAPS Take by mouth.  . furosemide (LASIX) 20 MG tablet Take 1 tablet (20 mg total) by mouth daily as needed.  Marland Kitchen glimepiride (AMARYL) 2 MG tablet Take 1 tablet (2 mg total) by mouth daily with breakfast.  . lisinopril (ZESTRIL) 20 MG tablet Take 1 tablet (20 mg total) by mouth daily. Do not cut in 1/2 dose increased dose to 20 mg  . lovastatin (MEVACOR) 20 MG tablet Take 1 tablet (20 mg total) by mouth daily at 6 PM.  . metoprolol succinate (TOPROL-XL) 100 MG 24 hr tablet Take 1 tablet (100 mg total) by mouth daily. Take with or immediately following a meal.  . traZODone (DESYREL)  50 MG tablet Take 1 tablet (50 mg total) by mouth at bedtime as needed for sleep.   No facility-administered encounter medications on file as of 11/16/2019.     Allergies (verified) Metformin and related   History: Past Medical History:  Diagnosis Date  . Diabetes mellitus without complication ([place])   . DVT (deep venous thrombosis) ([place])    06/02/2019  . Hard of hearing   . Hypertension    Past Surgical History:  Procedure Laterality Date  . CESAREAN SECTION    . COLONOSCOPY N/A 05/17/2019   Procedure: FLEXIBLE SIGMOIDOSCOPY;  Surgeon: Ileana Roup, MD;  Location: [facility];  Service: General;  Laterality: N/A;  . COLONOSCOPY WITH PROPOFOL N/A 01/09/2019   Procedure: COLONOSCOPY WITH PROPOFOL;  Surgeon: Lin Landsman, MD;  Location: [facility] ENDOSCOPY;  Service: Gastroenterology;  Laterality: N/A;  . ESOPHAGOGASTRODUODENOSCOPY (EGD) WITH PROPOFOL N/A 01/09/2019   Procedure: ESOPHAGOGASTRODUODENOSCOPY (EGD) WITH PROPOFOL;  Surgeon: Lin Landsman, MD;  Location: [facility] ENDOSCOPY;  Service: Gastroenterology;  Laterality: N/A;  . KNEE SURGERY     left 2017/2018  . SHOULDER SURGERY     right  . XI ROBOTIC ASSISTED LOWER ANTERIOR RESECTION N/A 05/17/2019   Procedure: XI ROBOTIC ASSISTED LOWER ANTERIOR RESECTION WITH END COLOSTOMY;  Surgeon: Ileana Roup, MD;  Location: [facility];  Service: General;  Laterality: N/A;   History reviewed. No pertinent family history. Social History   Socioeconomic History  .  Marital status: Widowed    Spouse name: Not on file  . Number of children: Not on file  . Years of education: Not on file  . Highest education level: Not on file  Occupational History  . Not on file  Social Needs  . Financial resource strain: Not hard at all  . Food insecurity    Worry: Never true    Inability: Never true  . Transportation needs    Medical: No    Non-medical: No  Tobacco Use  . Smoking status: Former Smoker    Years: 4.00  . Smokeless  tobacco: Never Used  . Tobacco comment: 3 per day  Substance and Sexual Activity  . Alcohol use: No    Frequency: Never  . Drug use: No  . Sexual activity: Not on file  Lifestyle  . Physical activity    Days per week: 3 days    Minutes per session: 20 min  . Stress: Not at all  Relationships  . Social Herbalist on phone: Not on file    Gets together: Not on file    Attends religious service: Not on file    Active member of club or organization: Not on file    Attends meetings of clubs or organizations: Not on file    Relationship status: Not on file  Other Topics Concern  . Not on file  Social History Narrative   Retired Pharmacist, hospital    2 daughters lives with [name]    1 son deceased    Widowed    Moved from [place] given: Not Answered Comment: 3 per day   Clinical Intake:  Pre-visit preparation completed: Yes        Diabetes: Yes  How often do you need to have someone help you when you read instructions, pamphlets, or other written materials from your doctor or pharmacy?: 1 - Never  Interpreter Needed?: No      Activities of Daily Living In your present state of health, do you have any difficulty performing the following activities: 11/16/2019 05/18/2019  Hearing? Y Y  Comment - hard of hearing  Vision? N N  Difficulty concentrating or making decisions? N N  Walking or climbing stairs? N N  Dressing or bathing? N N  Doing errands, shopping? Y N  Preparing Food and eating ? Y -  Comment Daughter meal preps. Self feeds. -  Using the Toilet? N -  In the past six months, have you accidently leaked urine? N -  Do you have problems with loss of bowel control? N -  Managing your Medications? N -  Managing your Finances? N -  Housekeeping or managing your Housekeeping? Y -  Comment Daughter assist -  Some recent data might be hidden     Immunizations and Health Maintenance Immunization History  Administered Date(s)  Administered  . Fluad Quad(high Dose 65+) 09/08/2019  . Influenza, High Dose Seasonal PF 09/30/2018  . Pneumococcal Conjugate-13 01/01/2018  . Pneumococcal Polysaccharide-23 11/01/2019   Health Maintenance Due  Topic Date Due  . FOOT EXAM  12/03/1939  . OPHTHALMOLOGY EXAM  [birth year]03/1939  . TETANUS/TDAP  12/03/1950  . DEXA SCAN  12/03/1996    Patient Care Team: McLean-Scocuzza, Nino Glow, MD as PCP - General (Internal Medicine)  Indicate any recent Medical Services you may have received from other than [facility] providers in the past year (date may be approximate).     Assessment:  This is a routine wellness examination for [place].  Nurse connected with patient 11/16/19 at 10:30 AM EST by a telephone enabled telemedicine application and verified that I am speaking with the correct person using two identifiers. Patient stated full name and DOB. Patient gave permission to continue with virtual visit. Patient's location was at home and Nurse's location was at [place] office.   Health Maintenance Due: -Tdap- discussed; to be completed with doctor in visit or local pharmacy.   -Hgb A1c- 11/13/19 (6.8) -Foot exam- followed by pcp -Dexa -deferred per patient request -Eye-reports last visit 1 year ago Update all pending maintenance due as appropriate.   See completed HM at the end of note.   Eye: Visual acuity not assessed. Virtual visit. Wears corrective lenses. Followed by their ophthalmologist. Retinopathy- none reported  Dental: Dentures- yes  Hearing: Hearing aids- yes  Safety:  Patient feels safe at home- yes Patient does have smoke detectors at home- yes Patient does wear sunscreen or protective clothing when in direct sunlight - yes Patient does wear seat belt when in a moving vehicle - yes Patient drives- no Adequate lighting in walkways free from debris- yes Grab bars and handrails used as appropriate- yes Ambulates with no assistive device Cell phone on person when  ambulating outside of the home- yes  Social: Alcohol intake - no   Smoking history- former Smokers in home? none Illicit drug use? none  Depression: PHQ 2 &9 complete. See screening below. Denies irritability, anhedonia, sadness/tearfullness.    Falls: See screening below.    Medication: Taking as directed and without issues.   Covid-19: Precautions and sickness symptoms discussed. Wears mask, social distancing, hand hygiene as appropriate.   Activities of Daily Living Patient denies needing assistance with: feeding themselves, getting from bed to chair, getting to the toilet, bathing/showering, dressing, managing money.  Assisted by daughter with household chores and cooking as needed.   Memory: Patient is alert. Patient denies difficulty focusing or concentrating. Correctly identified the president of the [place], season and recall.  BMI- discussed the importance of a healthy diet, water intake and the benefits of aerobic exercise.  Educational material provided.  Physical activity- walking her dog  Diet:  Regular Water: fair intake Caffeine: 1 cup coffee  Other Providers Patient Care Team: McLean-Scocuzza, Nino Glow, MD as PCP - General (Internal Medicine)  Hearing/Vision screen  Hearing Screening   125Hz  250Hz  500Hz  1000Hz  2000Hz  3000Hz  4000Hz  6000Hz  8000Hz   Right ear:           Left ear:           Comments: Hearing aids  Vision Screening Comments: Visual acuity not assessed, virtual visit.  They have seen their ophthalmologist in the last 12 months.     Dietary issues and exercise activities discussed: Current Exercise Habits: Home exercise routine, Type of exercise: walking  Goals    . Follow up with Primary Care Provider     As needed      Depression Screen PHQ 2/9 Scores 11/16/2019 09/30/2018  PHQ - 2 Score 0 0    Fall Risk Fall Risk  11/16/2019 09/30/2018 08/12/2018  Falls in the past year? 0 Yes No  Number falls in past yr: 0 2 or more -  Injury  with Fall? - No -  Risk Factor Category  - High Fall Risk -  Risk for fall due to : - History of fall(s) -  Follow up Falls prevention discussed - -   Timed Get Up  and Go Performed no, virtual visit  Cognitive Function:     6CIT Screen 11/16/2019  What Year? 0 points  What month? 0 points  What time? 0 points  Count back from 20 0 points  Months in reverse 0 points    Screening Tests Health Maintenance  Topic Date Due  . FOOT EXAM  12/03/1939  . OPHTHALMOLOGY EXAM  [birth year]03[birth year]1941  . TETANUS/TDAP  12/03/1950  . DEXA SCAN  12/03/1996  . HEMOGLOBIN A1C  05/12/2020  . COLONOSCOPY  05/16/2020  . INFLUENZA VACCINE  Completed  . PNA vac Low Risk Adult  Completed     Plan:   Keep all routine maintenance appointments.   Medicare Attestation I have personally reviewed: The patient's medical and social history Their use of alcohol, tobacco or illicit drugs Their current medications and supplements The patient's functional ability including ADLs,fall risks, home safety risks, cognitive, and hearing and visual impairment Diet and physical activities Evidence for depression   In addition, I have reviewed and discussed with patient certain preventive protocols, quality metrics, and best practice recommendations. A written personalized care plan for preventive services as well as general preventive health recommendations were provided to patient via mail.     Varney Biles, LPN   [phone]

## 2019-12-16 DIAGNOSIS — Z933 Colostomy status: Secondary | ICD-10-CM | POA: Diagnosis not present

## 2019-12-16 DIAGNOSIS — K56609 Unspecified intestinal obstruction, unspecified as to partial versus complete obstruction: Secondary | ICD-10-CM | POA: Diagnosis not present

## 2020-01-08 DIAGNOSIS — Z8719 Personal history of other diseases of the digestive system: Secondary | ICD-10-CM | POA: Diagnosis not present

## 2020-01-08 DIAGNOSIS — R159 Full incontinence of feces: Secondary | ICD-10-CM | POA: Diagnosis not present

## 2020-01-08 DIAGNOSIS — Z933 Colostomy status: Secondary | ICD-10-CM | POA: Diagnosis not present

## 2020-01-08 DIAGNOSIS — K635 Polyp of colon: Secondary | ICD-10-CM | POA: Diagnosis not present

## 2020-02-19 DIAGNOSIS — Z933 Colostomy status: Secondary | ICD-10-CM | POA: Diagnosis not present

## 2020-02-19 DIAGNOSIS — K56609 Unspecified intestinal obstruction, unspecified as to partial versus complete obstruction: Secondary | ICD-10-CM | POA: Diagnosis not present

## 2020-03-01 ENCOUNTER — Other Ambulatory Visit: Payer: Self-pay

## 2020-03-05 ENCOUNTER — Ambulatory Visit (INDEPENDENT_AMBULATORY_CARE_PROVIDER_SITE_OTHER): Payer: Medicare HMO | Admitting: Internal Medicine

## 2020-03-05 ENCOUNTER — Other Ambulatory Visit: Payer: Self-pay

## 2020-03-05 ENCOUNTER — Encounter: Payer: Self-pay | Admitting: Internal Medicine

## 2020-03-05 VITALS — BP 134/84 | HR 81 | Temp 97.6°F | Ht 64.0 in | Wt 199.2 lb

## 2020-03-05 DIAGNOSIS — Z1329 Encounter for screening for other suspected endocrine disorder: Secondary | ICD-10-CM

## 2020-03-05 DIAGNOSIS — N3000 Acute cystitis without hematuria: Secondary | ICD-10-CM

## 2020-03-05 DIAGNOSIS — D128 Benign neoplasm of rectum: Secondary | ICD-10-CM | POA: Diagnosis not present

## 2020-03-05 DIAGNOSIS — E559 Vitamin D deficiency, unspecified: Secondary | ICD-10-CM

## 2020-03-05 DIAGNOSIS — H6123 Impacted cerumen, bilateral: Secondary | ICD-10-CM | POA: Diagnosis not present

## 2020-03-05 DIAGNOSIS — E1159 Type 2 diabetes mellitus with other circulatory complications: Secondary | ICD-10-CM

## 2020-03-05 DIAGNOSIS — D72829 Elevated white blood cell count, unspecified: Secondary | ICD-10-CM | POA: Diagnosis not present

## 2020-03-05 DIAGNOSIS — E119 Type 2 diabetes mellitus without complications: Secondary | ICD-10-CM

## 2020-03-05 DIAGNOSIS — I152 Hypertension secondary to endocrine disorders: Secondary | ICD-10-CM

## 2020-03-05 DIAGNOSIS — I10 Essential (primary) hypertension: Secondary | ICD-10-CM

## 2020-03-05 DIAGNOSIS — R61 Generalized hyperhidrosis: Secondary | ICD-10-CM

## 2020-03-05 LAB — CBC WITH DIFFERENTIAL/PLATELET
Basophils Absolute: 0.1 10*3/uL (ref 0.0–0.1)
Basophils Relative: 0.7 % (ref 0.0–3.0)
Eosinophils Absolute: 0.8 10*3/uL — ABNORMAL HIGH (ref 0.0–0.7)
Eosinophils Relative: 7.7 % — ABNORMAL HIGH (ref 0.0–5.0)
HCT: 36.8 % (ref 36.0–46.0)
Hemoglobin: 12.4 g/dL (ref 12.0–15.0)
Lymphocytes Relative: 32.1 % (ref 12.0–46.0)
Lymphs Abs: 3.5 10*3/uL (ref 0.7–4.0)
MCHC: 33.8 g/dL (ref 30.0–36.0)
MCV: 89.2 fl (ref 78.0–100.0)
Monocytes Absolute: 1.3 10*3/uL — ABNORMAL HIGH (ref 0.1–1.0)
Monocytes Relative: 11.7 % (ref 3.0–12.0)
Neutro Abs: 5.1 10*3/uL (ref 1.4–7.7)
Neutrophils Relative %: 47.8 % (ref 43.0–77.0)
Platelets: 238 10*3/uL (ref 150.0–400.0)
RBC: 4.13 Mil/uL (ref 3.87–5.11)
RDW: 14.1 % (ref 11.5–15.5)
WBC: 10.7 10*3/uL — ABNORMAL HIGH (ref 4.0–10.5)

## 2020-03-05 LAB — COMPREHENSIVE METABOLIC PANEL
ALT: 10 U/L (ref 0–35)
AST: 14 U/L (ref 0–37)
Albumin: 3.8 g/dL (ref 3.5–5.2)
Alkaline Phosphatase: 44 U/L (ref 39–117)
BUN: 16 mg/dL (ref 6–23)
CO2: 28 mEq/L (ref 19–32)
Calcium: 9.5 mg/dL (ref 8.4–10.5)
Chloride: 103 mEq/L (ref 96–112)
Creatinine, Ser: 0.97 mg/dL (ref 0.40–1.20)
GFR: 54.17 mL/min — ABNORMAL LOW (ref >60.00)
Glucose, Bld: 123 mg/dL — ABNORMAL HIGH (ref 70–99)
Potassium: 4 mEq/L (ref 3.5–5.1)
Sodium: 137 mEq/L (ref 135–145)
Total Bilirubin: 0.4 mg/dL (ref 0.2–1.2)
Total Protein: 6.9 g/dL (ref 6.0–8.3)

## 2020-03-05 LAB — HEMOGLOBIN A1C: Hgb A1c MFr Bld: 6.9 % — ABNORMAL HIGH (ref 4.6–6.5)

## 2020-03-05 NOTE — Progress Notes (Signed)
Chief Complaint  Patient presents with  . Follow-up   F/u  1. Ear cleaning today with hearing aids  2. C/o sweating 2 weeks ago woke up feeling hot and sweaty and then resolved after the day progressed feeling better ostomy to left lower abdomen normal no ab pain. No UTI sx's  3. DM 2 a1C 6.8 on amaryl 2 mg she reports not drinking enough water and eating a lot of sweets  4. H/o tubullovillous adenoma in 2020 removed and reports blood in her stool recently but declines to go for f/u GI Dr. Marius Ditch at this time reports she just saw Dr. Dema Severin 12/2019 but Im unable to see surgery notes    Review of Systems  Constitutional: Positive for diaphoresis. Negative for weight loss.  HENT: Negative for hearing loss.   Eyes: Negative for blurred vision.  Respiratory: Negative for shortness of breath.   Cardiovascular: Negative for chest pain.  Gastrointestinal: Positive for blood in stool. Negative for abdominal pain.  Musculoskeletal: Negative for falls.  Skin: Negative for rash.  Psychiatric/Behavioral: Negative for memory loss.   Past Medical History:  Diagnosis Date  . Diabetes mellitus without complication ([place])   . DVT (deep venous thrombosis) ([place])    06/02/2019  . Hard of hearing   . Hypertension    Past Surgical History:  Procedure Laterality Date  . CESAREAN SECTION    . COLONOSCOPY N/A 05/17/2019   Procedure: FLEXIBLE SIGMOIDOSCOPY;  Surgeon: Ileana Roup, MD;  Location: [facility];  Service: General;  Laterality: N/A;  . COLONOSCOPY WITH PROPOFOL N/A 01/09/2019   Procedure: COLONOSCOPY WITH PROPOFOL;  Surgeon: Lin Landsman, MD;  Location: [facility] ENDOSCOPY;  Service: Gastroenterology;  Laterality: N/A;  . ESOPHAGOGASTRODUODENOSCOPY (EGD) WITH PROPOFOL N/A 01/09/2019   Procedure: ESOPHAGOGASTRODUODENOSCOPY (EGD) WITH PROPOFOL;  Surgeon: Lin Landsman, MD;  Location[facility] ENDOSCOPY;  Service: Gastroenterology;  Laterality: N/A;  . KNEE SURGERY     left 2017/2018  .  SHOULDER SURGERY     right  . XI ROBOTIC ASSISTED LOWER ANTERIOR RESECTION N/A 05/17/2019   Procedure: XI ROBOTIC ASSISTED LOWER ANTERIOR RESECTION WITH END COLOSTOMY;  Surgeon: Ileana Roup, MD;  Location: [facility];  Service: General;  Laterality: N/A;   No family history on file. Social History   Socioeconomic History  . Marital status: Widowed    Spouse name: Not on file  . Number of children: Not on file  . Years of education: Not on file  . Highest education level: Not on file  Occupational History  . Not on file  Tobacco Use  . Smoking status: Former Smoker    Years: 4.00  . Smokeless tobacco: Never Used  . Tobacco comment: 3 per day  Substance and Sexual Activity  . Alcohol use: No  . Drug use: No  . Sexual activity: Not on file  Other Topics Concern  . Not on file  Social History Narrative   Retired Pharmacist, hospital    2 daughters lives with [name]    1 son deceased    Widowed    Moved from [place] Strain: [place]   . Difficulty of Paying Living Expenses: Not hard at all  Food Insecurity: No Food Insecurity  . Worried About Charity fundraiser in the Last Year: Never true  . Ran Out of Food in the Last Year: Never true  Transportation Needs: No Transportation Needs  . Lack of Transportation (Medical): No  .  Lack of Transportation (Non-Medical): No  Physical Activity: Insufficiently Active  . Days of Exercise per Week: 3 days  . Minutes of Exercise per Session: 20 min  Stress: No Stress Concern Present  . Feeling of Stress : Not at all  Social Connections:   . Frequency of Communication with Friends and Family: Not on file  . Frequency of Social Gatherings with Friends and Family: Not on file  . Attends Religious Services: Not on file  . Active Member of Clubs or Organizations: Not on file  . Attends Archivist Meetings: Not on file  . Marital Status: Not on file  Intimate Partner Violence:   .  Fear of Current or Ex-Partner: Not on file  . Emotionally Abused: Not on file  . Physically Abused: Not on file  . Sexually Abused: Not on file   Current Meds  Medication Sig  . aspirin 81 MG chewable tablet Chew 81 mg by mouth daily.  . Cholecalciferol 100 MCG (4000 UT) CAPS Take by mouth.  . Cholecalciferol 100 MCG (4000 UT) CAPS Take by mouth.  . furosemide (LASIX) 20 MG tablet Take 1 tablet (20 mg total) by mouth daily as needed.  Marland Kitchen glimepiride (AMARYL) 2 MG tablet Take 1 tablet (2 mg total) by mouth daily with breakfast.  . lisinopril (ZESTRIL) 20 MG tablet Take 1 tablet (20 mg total) by mouth daily. Do not cut in 1/2 dose increased dose to 20 mg  . lovastatin (MEVACOR) 20 MG tablet Take 1 tablet (20 mg total) by mouth daily at 6 PM.  . metoprolol succinate (TOPROL-XL) 100 MG 24 hr tablet Take 1 tablet (100 mg total) by mouth daily. Take with or immediately following a meal.  . traZODone (DESYREL) 50 MG tablet Take 1 tablet (50 mg total) by mouth at bedtime as needed for sleep.   Allergies  Allergen Reactions  . Metformin And Related     Diarrhea    No results found for this or any previous visit (from the past 2160 hour(s)). Objective  Body mass index is 34.19 kg/m. Wt Readings from Last 3 Encounters:  03/05/20 199 lb 3.2 oz (90.4 kg)  11/01/19 195 lb 6.4 oz (88.6 kg)  06/08/19 193 lb 3.2 oz (87.6 kg)   Temp Readings from Last 3 Encounters:  03/05/20 97.6 F (36.4 C) (Skin)  11/01/19 97.8 F (36.6 C) (Skin)  06/08/19 98 F (36.7 C) (Oral)   BP Readings from Last 3 Encounters:  03/05/20 134/84  11/01/19 126/70  06/08/19 134/70   Pulse Readings from Last 3 Encounters:  03/05/20 81  11/01/19 69  06/08/19 64    Physical Exam Vitals and nursing note reviewed.  Constitutional:      Appearance: Normal appearance. She is well-developed and well-groomed.  HENT:     Head: Normocephalic and atraumatic.  Eyes:     Conjunctiva/sclera: Conjunctivae normal.      Pupils: Pupils are equal, round, and reactive to light.  Cardiovascular:     Rate and Rhythm: Normal rate and regular rhythm.     Heart sounds: Normal heart sounds. No murmur.  Pulmonary:     Effort: Pulmonary effort is normal.     Breath sounds: Normal breath sounds.  Abdominal:     General: Abdomen is flat. Bowel sounds are normal.       Comments: llq ostomy bag intact no evidence of infection    Musculoskeletal:     Right lower leg: 1+ Pitting Edema present.  Left lower leg: 1+ Pitting Edema present.  Skin:    General: Skin is warm and dry.  Neurological:     General: No focal deficit present.     Mental Status: She is alert and oriented to person, place, and time. Mental status is at baseline.     Gait: Gait normal.  Psychiatric:        Attention and Perception: Attention and perception normal.        Mood and Affect: Mood and affect normal.        Speech: Speech normal.        Behavior: Behavior normal. Behavior is cooperative.        Thought Content: Thought content normal.        Cognition and Memory: Cognition and memory normal.        Judgment: Judgment normal.     Assessment  Plan  Type 2 diabetes mellitus without complication, without long-term current use of insulin (HCC) - Plan: Comprehensive metabolic panel, CBC with Differential/Platelet, Hemoglobin A1c Cont meds check labs today  Foot exam today   Leukocytosis, unspecified type - Plan: CBC with Differential/Platelet, UA and culture   Tubulovillous adenoma of rectum -pt declines to see Dr. Marius Ditch for now informed she needed f/u for this in 1 year   Bilateral impacted cerumen Removed with currette b/l ears today   Excessive sweating, resolved Check labs today as above    HM Had flu shotutd utd prevnar  pna 23 utd  covid pfizer had 01/01/20 and 01/29/20   Tdap and shingrix considerwill disc in future again MMR immune  Never had colonoscopy cologuard + 4/1/17see chartdeclined colonoscopys/p  rectal mass excisiondue again f/u 12/2019; left colostomy bag long term by Dr. Gerald Stabs white, colonoscopy Dr. Marius Ditch No pap  mammo out of age window  dexa 01/21/14 normal  WBC 11.5 noted 05/31/18 labs   Reviewed CT ab/pelvis 02/11/18 b/l atrophic kidneys kidney right side and b/l kidney scarring, 1.3 cm gallstone, diverticulosis, aortic calcifications, MV calcification.   Provider: Dr. Olivia Mackie McLean-Scocuzza-Internal Medicine

## 2020-03-05 NOTE — Patient Instructions (Addendum)
Please back cut back on sweets, sweet drinks  Try to drink more water 50 ounces  Try HINT water its delicious    Please consider GI follow up about your precancerous polyp previously removed  Let me know when ready  Take care

## 2020-03-18 DIAGNOSIS — K56609 Unspecified intestinal obstruction, unspecified as to partial versus complete obstruction: Secondary | ICD-10-CM | POA: Diagnosis not present

## 2020-03-18 DIAGNOSIS — Z933 Colostomy status: Secondary | ICD-10-CM | POA: Diagnosis not present

## 2020-03-26 DIAGNOSIS — I10 Essential (primary) hypertension: Secondary | ICD-10-CM | POA: Diagnosis not present

## 2020-03-26 DIAGNOSIS — H25813 Combined forms of age-related cataract, bilateral: Secondary | ICD-10-CM | POA: Diagnosis not present

## 2020-03-26 DIAGNOSIS — H35033 Hypertensive retinopathy, bilateral: Secondary | ICD-10-CM | POA: Diagnosis not present

## 2020-04-17 DIAGNOSIS — Z933 Colostomy status: Secondary | ICD-10-CM | POA: Diagnosis not present

## 2020-04-17 DIAGNOSIS — K56609 Unspecified intestinal obstruction, unspecified as to partial versus complete obstruction: Secondary | ICD-10-CM | POA: Diagnosis not present

## 2020-05-15 DIAGNOSIS — E119 Type 2 diabetes mellitus without complications: Secondary | ICD-10-CM | POA: Diagnosis not present

## 2020-05-15 LAB — HM DIABETES EYE EXAM

## 2020-05-17 ENCOUNTER — Encounter: Payer: Self-pay | Admitting: Internal Medicine

## 2020-05-17 IMAGING — US VENOUS DOPPLER ULTRASOUND OF  LOWER EXTREMITIES
1 series · 13 of 24 positions shown · non-contrast
Comparison: None.

CLINICAL DATA: Bilateral lower extremity swelling



[Series 1: venous doppler ultrasound of lower extremities · 13 of 61 slices shown]
[im 1/61]
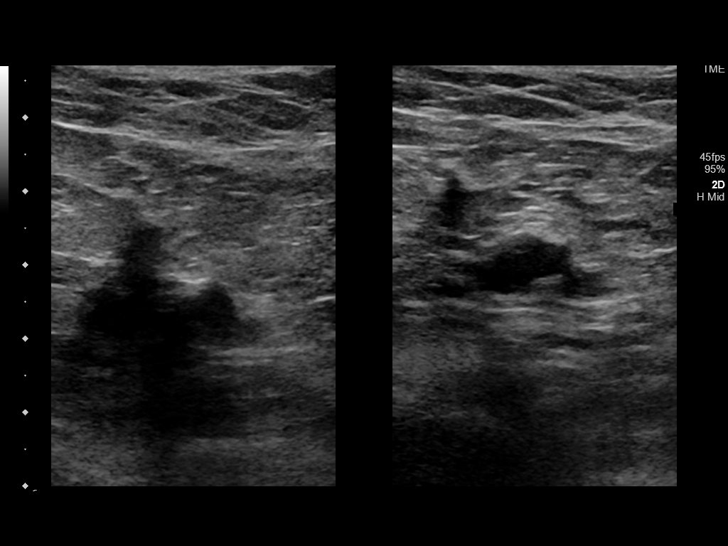
[im 6/61]
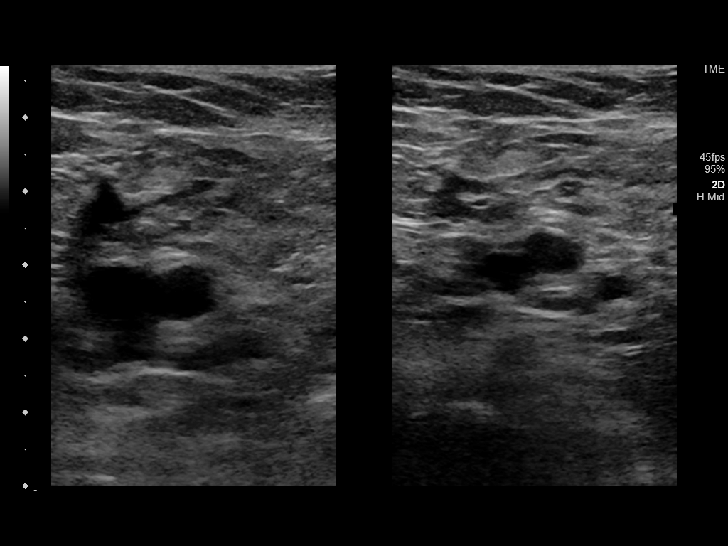
[im 11/61]
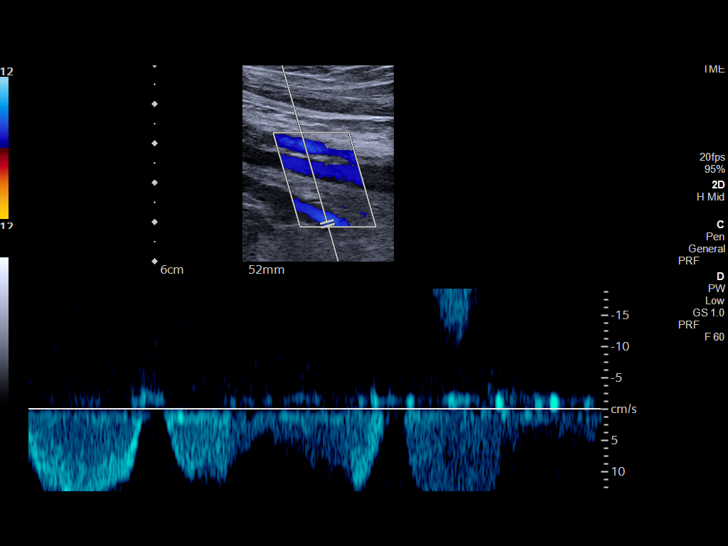
[im 16/61]
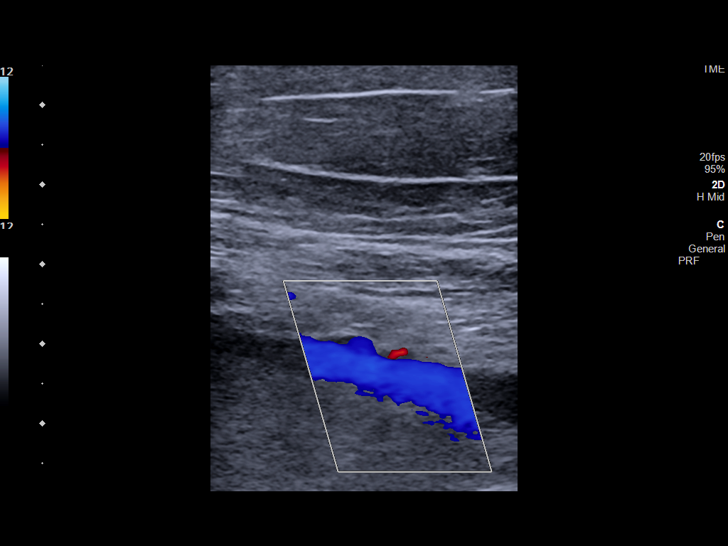
[im 21/61]
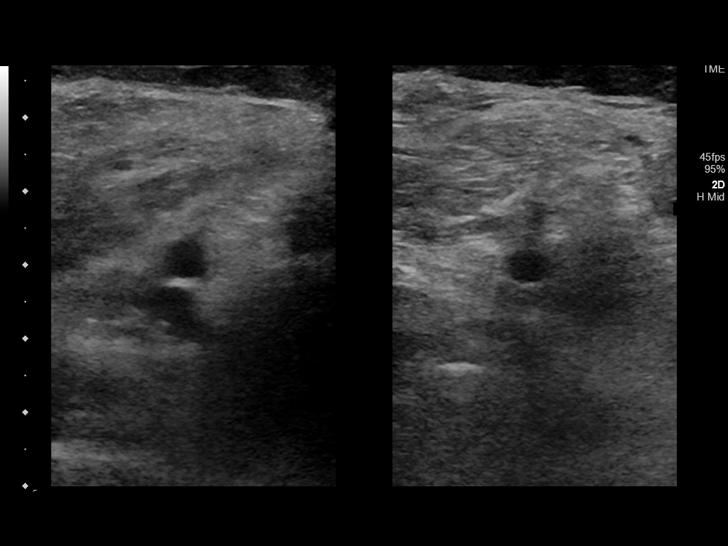
[im 27/61]
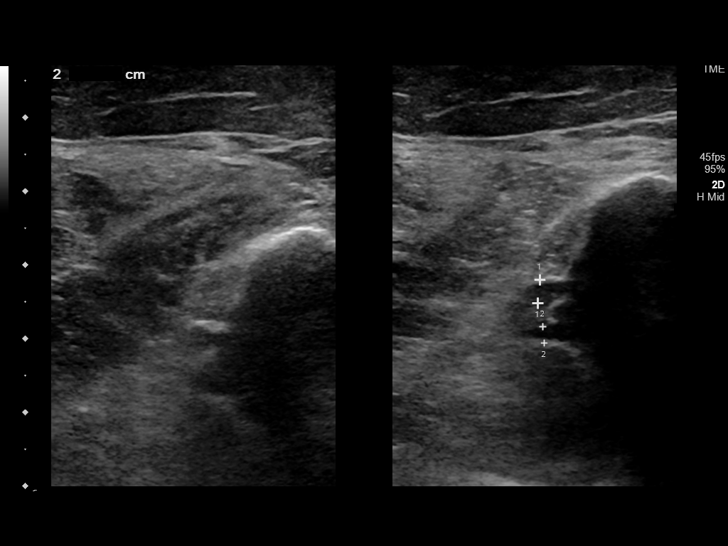
[im 32/61]
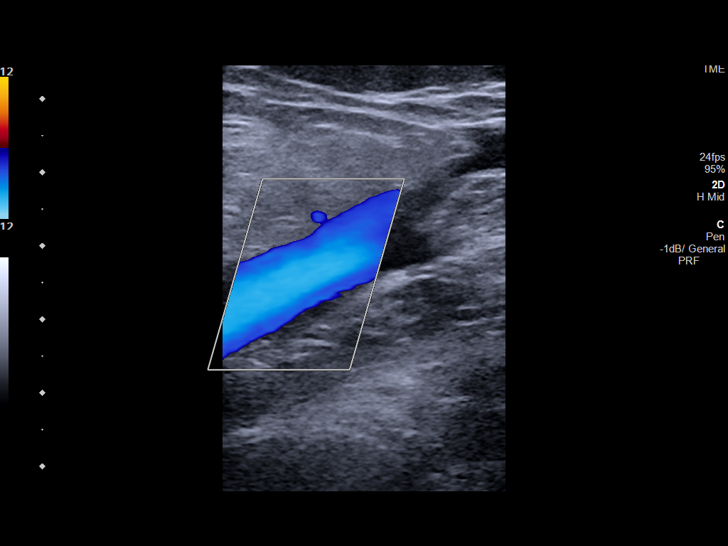
[im 34/61]
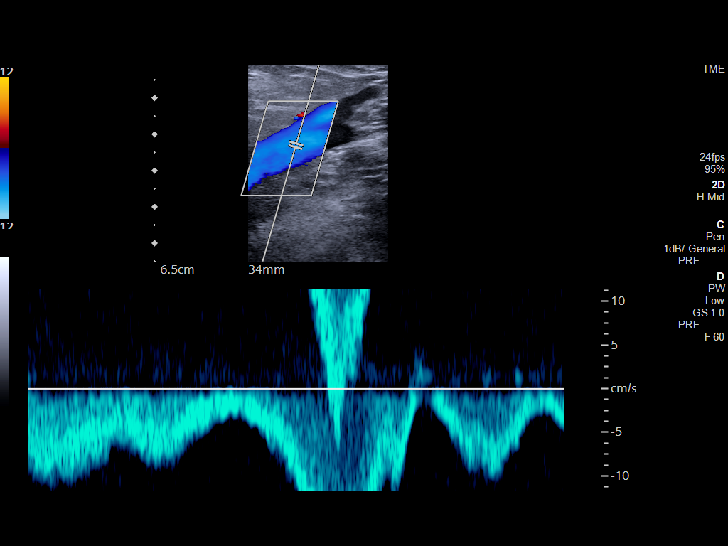
[im 40/61]
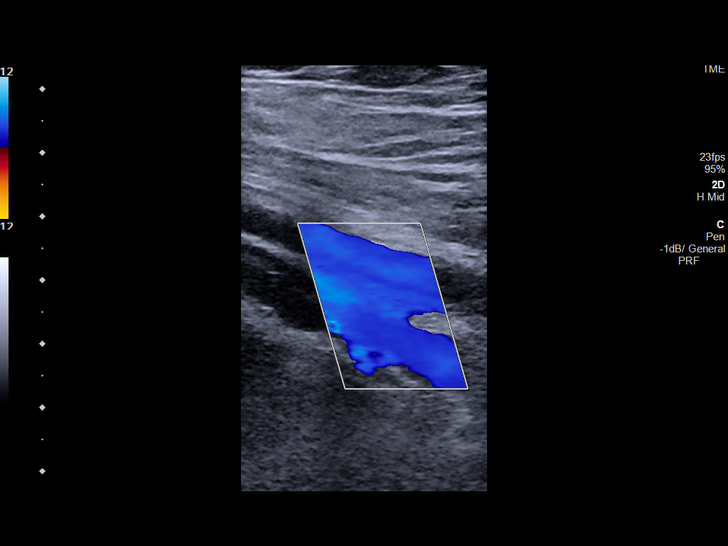
[im 45/61]
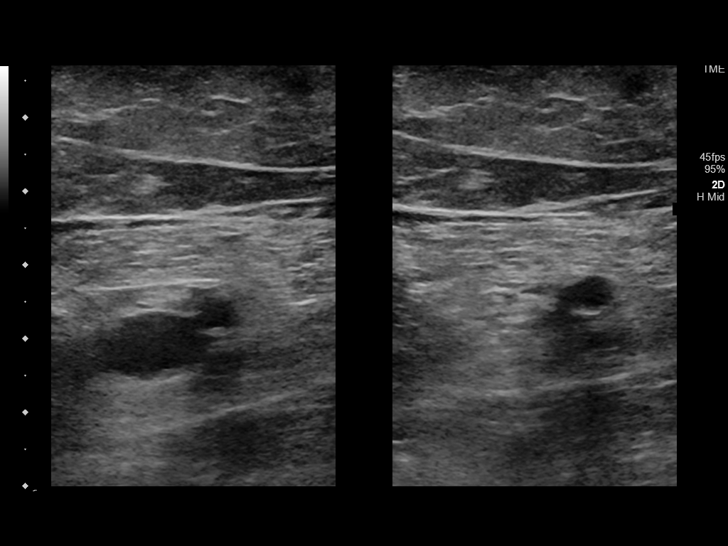
[im 50/61]
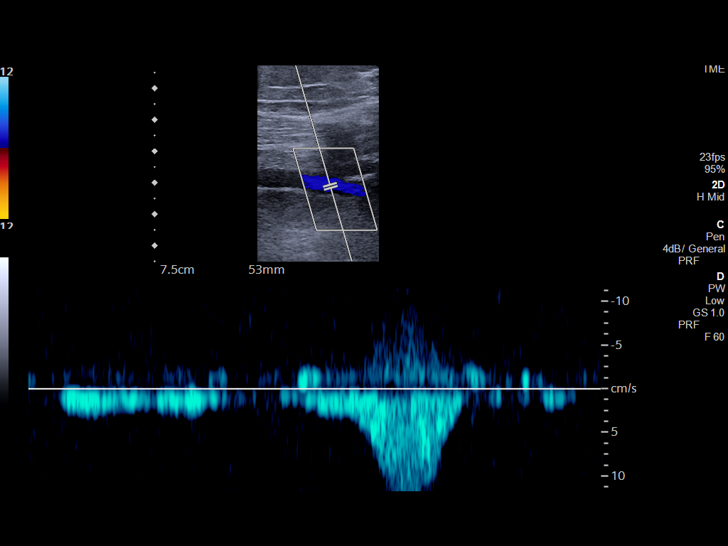
[im 55/61]
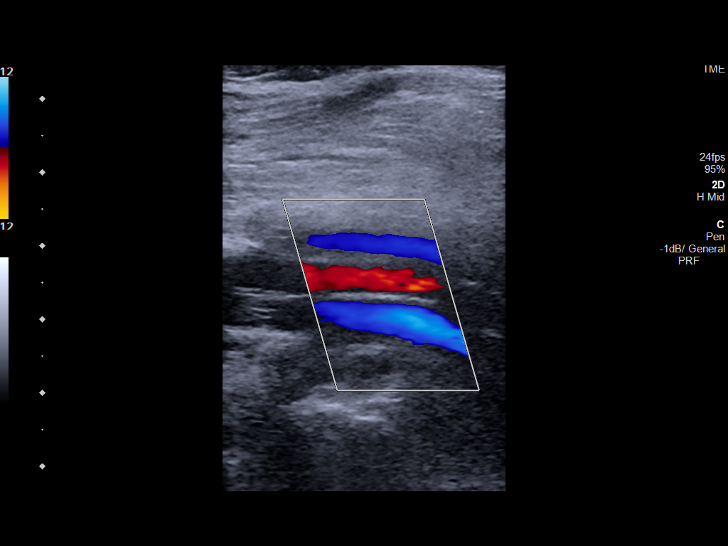
[im 61/61]
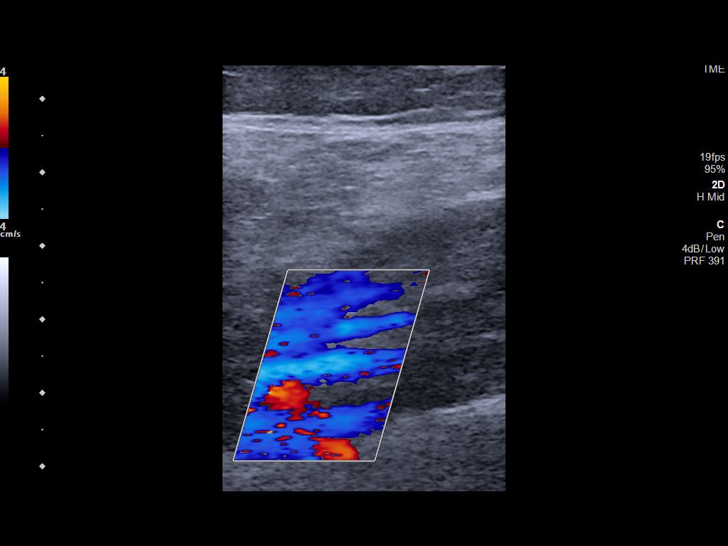

[13 of 24 positions shown; findings below may reference images not displayed]

FINDINGS: RIGHT LOWER EXTREMITY

Common Femoral Vein: No evidence of thrombus. Normal
compressibility, respiratory phasicity and response to augmentation.

Saphenofemoral Junction: No evidence of thrombus. Normal
compressibility and flow on color Doppler imaging.

Profunda Femoral Vein: No evidence of thrombus. Normal
compressibility and flow on color Doppler imaging.

Femoral Vein: No evidence of thrombus. Normal compressibility,
respiratory phasicity and response to augmentation.

Popliteal Vein: No evidence of thrombus. Normal compressibility,
respiratory phasicity and response to augmentation.

Calf Veins: No evidence of thrombus. Normal compressibility and flow
on color Doppler imaging.

Superficial Great Saphenous Vein: No evidence of thrombus. Normal
compressibility.

Venous Reflux: A small amount of reflux is noted in the right common
femoral vein.

Other Findings:  None.

LEFT LOWER EXTREMITY

Common Femoral Vein: No evidence of thrombus. Normal
compressibility, respiratory phasicity and response to augmentation.

Saphenofemoral Junction: No evidence of thrombus. Normal
compressibility and flow on color Doppler imaging.

Profunda Femoral Vein: No evidence of thrombus. Normal
compressibility and flow on color Doppler imaging.

Femoral Vein: No evidence of thrombus. Normal compressibility,
respiratory phasicity and response to augmentation.

Popliteal Vein: There is nonocclusive thrombus in the distal
popliteal vein.

Calf Veins: There is occlusive thrombus in the posterior tibial and
peroneal veins.

Superficial Great Saphenous Vein: No evidence of thrombus. Normal
compressibility.

Venous Reflux: A small amount reflux is noted in the left common
femoral vein.

Other Findings:  None.
IMPRESSION: 1. Nonocclusive DVT within the left popliteal vein. There is
occlusive thrombus within the posterior tibial and peroneal veins on
the left.
2. No DVT identified involving the right lower extremity.

## 2020-05-17 IMAGING — CT CT ANGIOGRAPHY CHEST
2 of 6 series · 18 of 46 positions shown · IV contrast (APPLIED)
Comparison: None.

CLINICAL DATA: Leg swelling with shortness of breath.

EXAM:
CT ANGIOGRAPHY CHEST WITH CONTRAST
TECHNIQUE: Multidetector CT imaging of the chest was performed using the
standard protocol during bolus administration of intravenous
contrast. Multiplanar CT image reconstructions and MIPs were
obtained to evaluate the vascular anatomy.
CONTRAST:  75mL OMNIPAQUE IOHEXOL 350 MG/ML SOLN

[Series 5: thins · axial · 0.70mm/px · z∈[-592,-356]mm · 15 of 260 slices shown]
[im 12/260  lung]
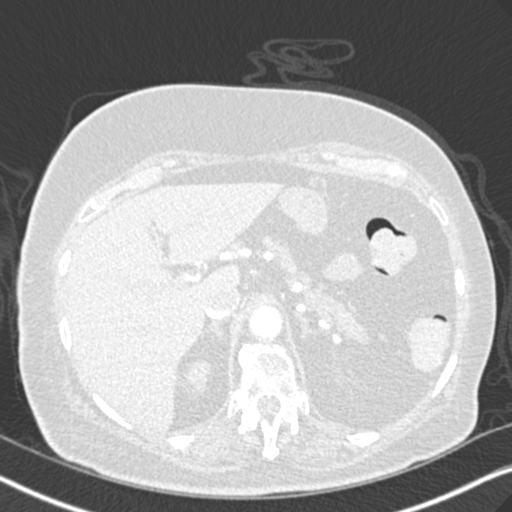
[im 34/260  soft-tissue]
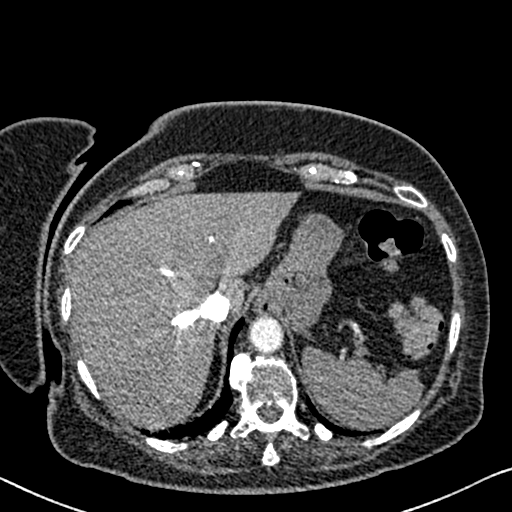
[im 46/260  lung]
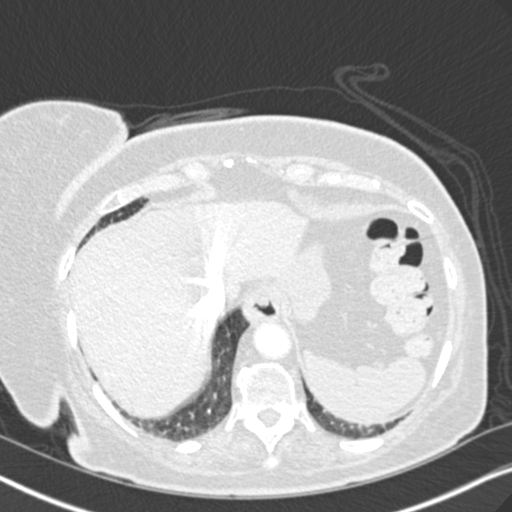
[im 68/260  soft-tissue]
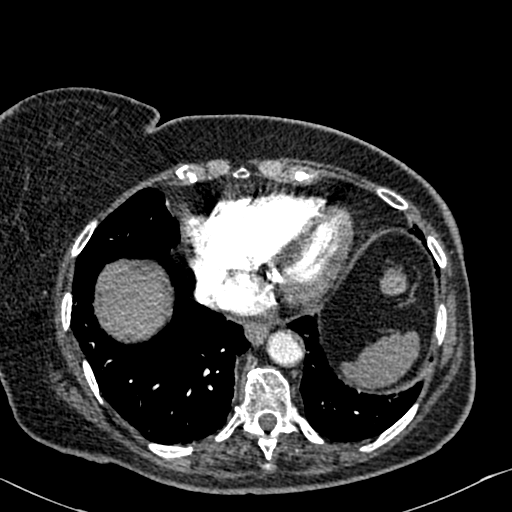
[im 79/260  lung]
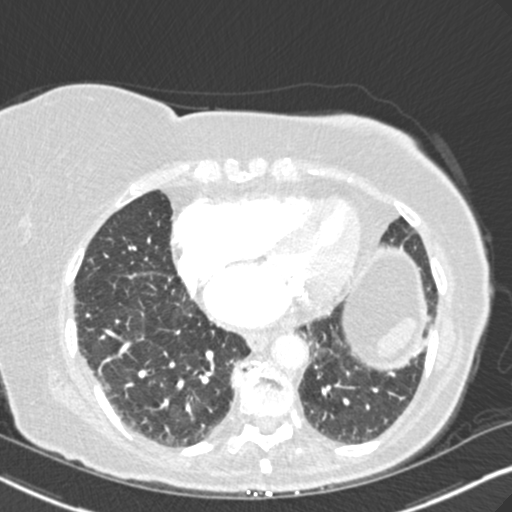
[im 102/260  soft-tissue]
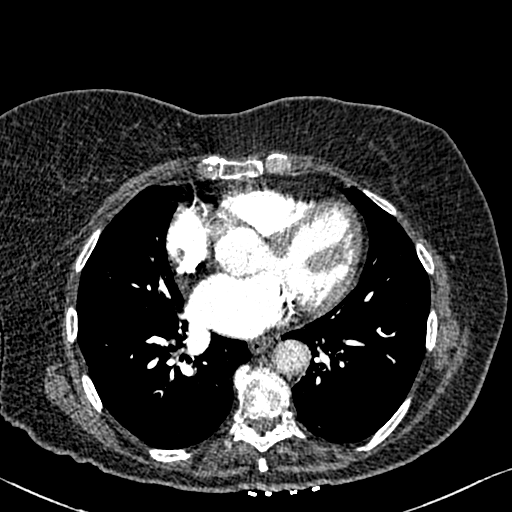
[im 113/260  lung]
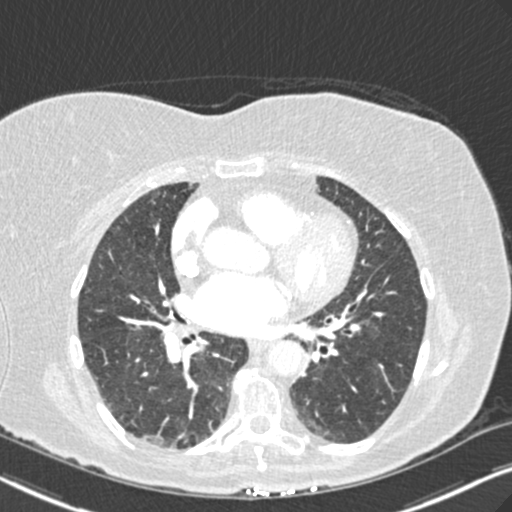
[im 136/260  soft-tissue]
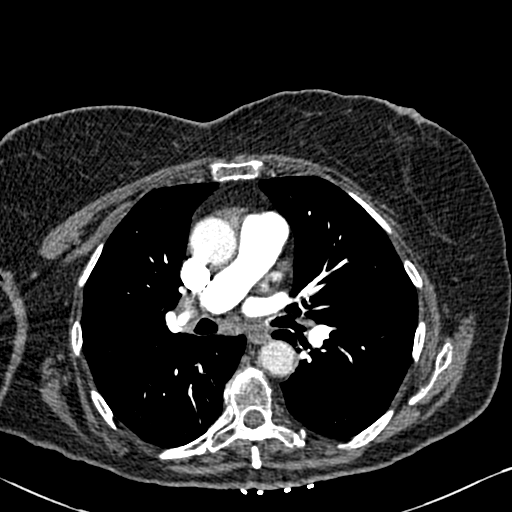
[im 147/260  lung]
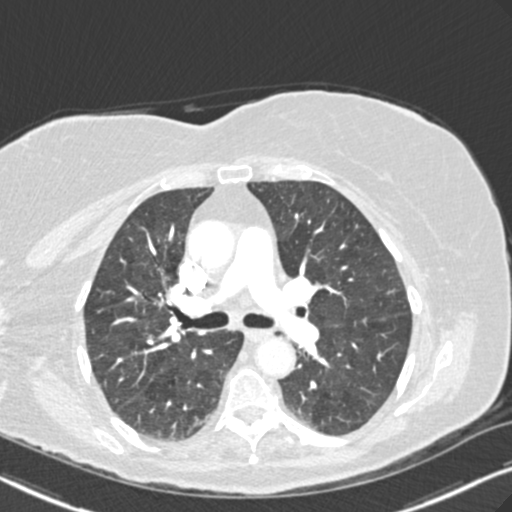
[im 158/260  soft-tissue]
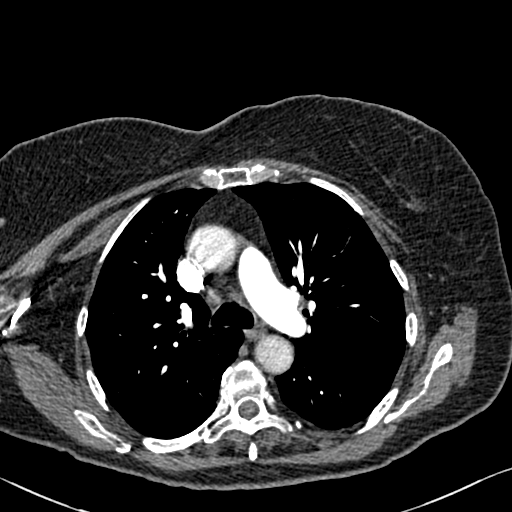
[im 181/260  lung]
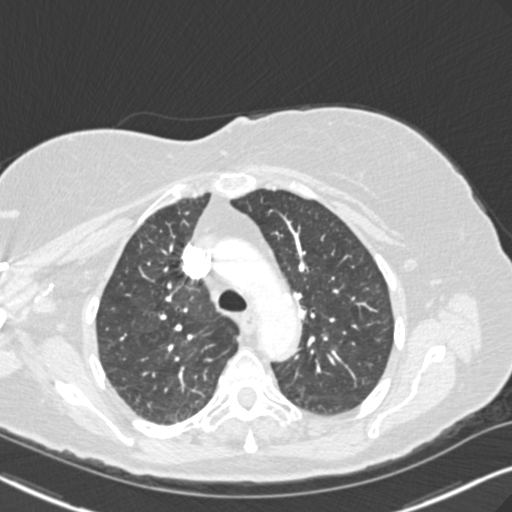
[im 192/260  soft-tissue]
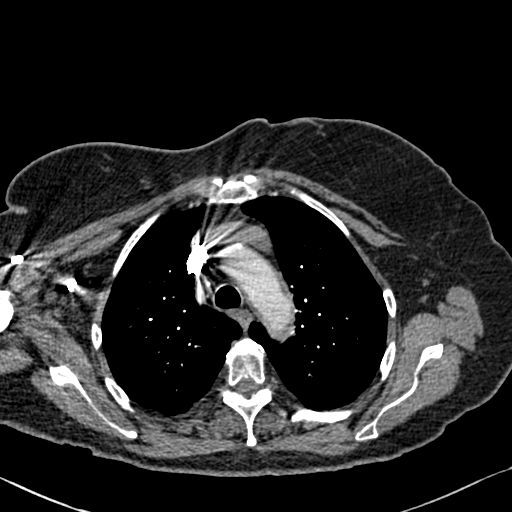
[im 214/260  lung]
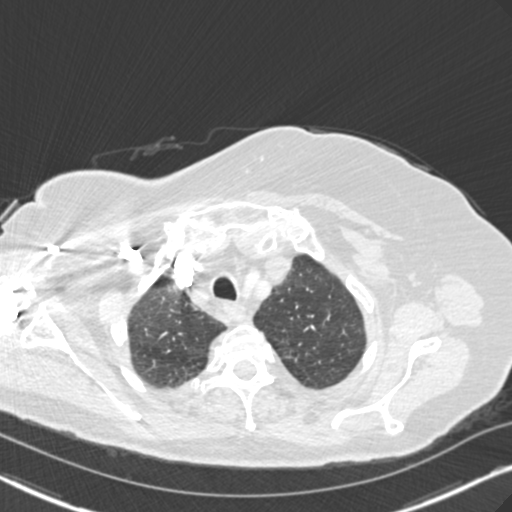
[im 226/260  soft-tissue]
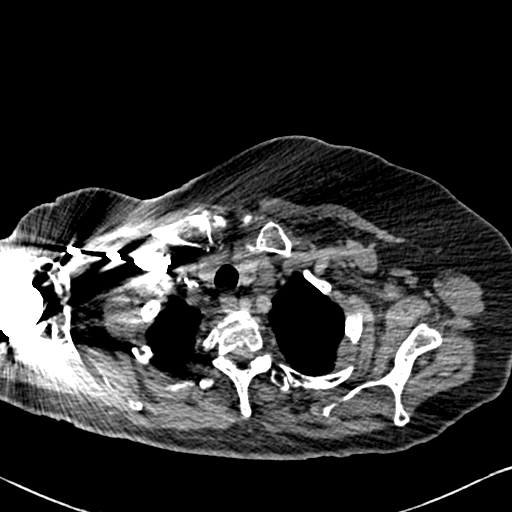
[im 248/260  lung]
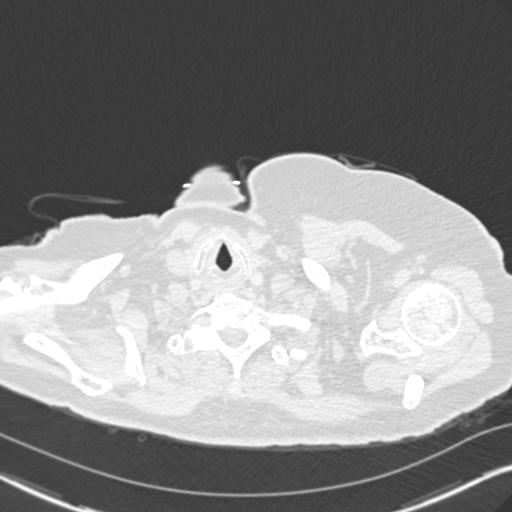

[Series 7: coronal mpr · coronal · 0.51mm/px · 3 of 88 slices shown]
[im 22/88  soft-tissue]
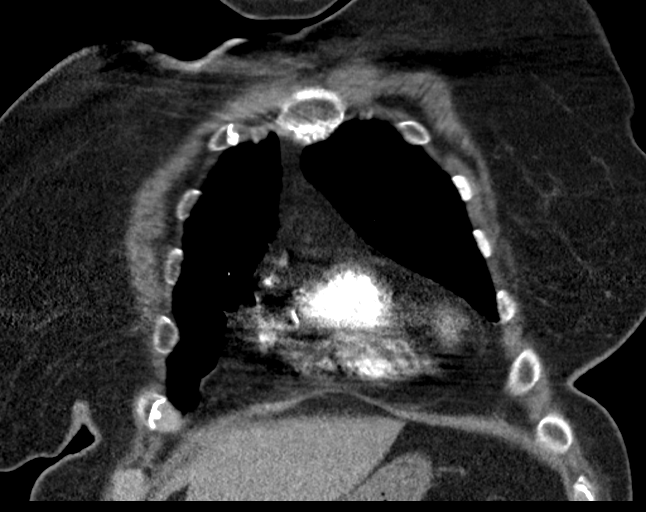
[im 44/88  soft-tissue]
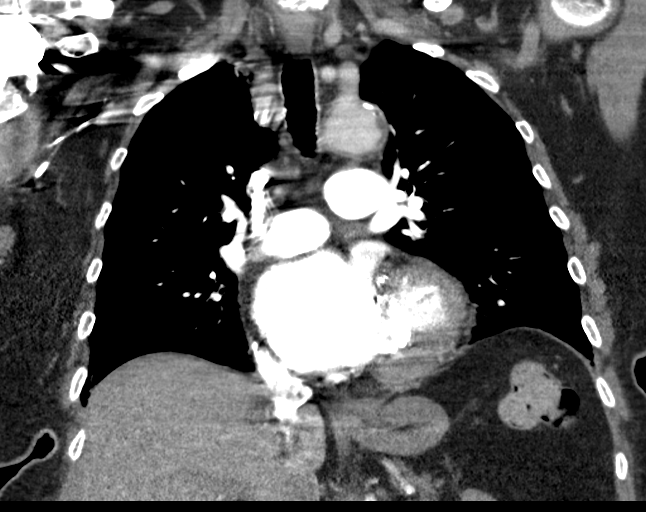
[im 66/88  soft-tissue]
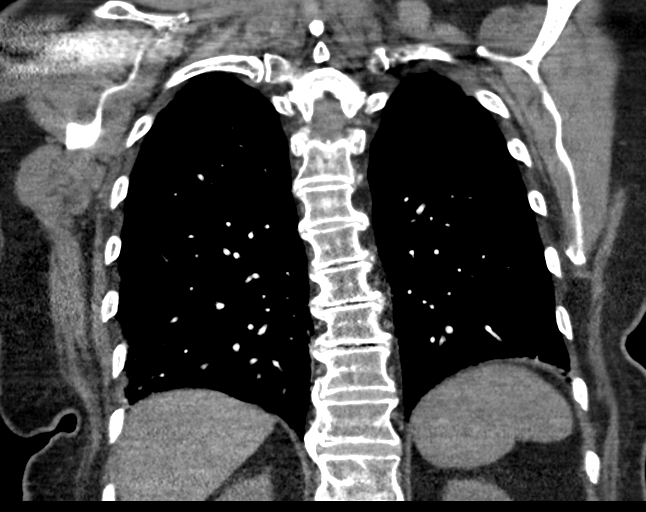

[18 of 46 positions shown; findings below may reference images not displayed]

FINDINGS: Cardiovascular: Satisfactory opacification of the pulmonary arteries
to the segmental level. No evidence of pulmonary embolism. The heart
size is moderately enlarged. The main pulmonary artery is not
significantly dilated. There are mitral valve calcifications with
left atrial enlargement. There is reflux of contrast into the IVC.
Aortic calcifications are noted.

Mediastinum/Nodes: No enlarged mediastinal, hilar, or axillary lymph
nodes. Thyroid gland, trachea, and esophagus demonstrate no
significant findings.

Lungs/Pleura: Lungs are clear. No pleural effusion or pneumothorax.

Upper Abdomen: There is a small hiatal hernia.

Musculoskeletal: No chest wall abnormality. No acute or significant
osseous findings.

Review of the MIP images confirms the above findings.
IMPRESSION: 1. No PE.
2. Cardiac enlargement with reflux of contrast into the IVC
consistent with underlying cardiac dysfunction.

Aortic Atherosclerosis ([id number]).

## 2020-05-28 DIAGNOSIS — Z933 Colostomy status: Secondary | ICD-10-CM | POA: Diagnosis not present

## 2020-05-28 DIAGNOSIS — K56609 Unspecified intestinal obstruction, unspecified as to partial versus complete obstruction: Secondary | ICD-10-CM | POA: Diagnosis not present

## 2020-06-26 DIAGNOSIS — Z933 Colostomy status: Secondary | ICD-10-CM | POA: Diagnosis not present

## 2020-06-26 DIAGNOSIS — K56609 Unspecified intestinal obstruction, unspecified as to partial versus complete obstruction: Secondary | ICD-10-CM | POA: Diagnosis not present

## 2020-07-25 DIAGNOSIS — Z933 Colostomy status: Secondary | ICD-10-CM | POA: Diagnosis not present

## 2020-07-25 DIAGNOSIS — K56609 Unspecified intestinal obstruction, unspecified as to partial versus complete obstruction: Secondary | ICD-10-CM | POA: Diagnosis not present

## 2020-08-30 DIAGNOSIS — K56609 Unspecified intestinal obstruction, unspecified as to partial versus complete obstruction: Secondary | ICD-10-CM | POA: Diagnosis not present

## 2020-08-30 DIAGNOSIS — Z933 Colostomy status: Secondary | ICD-10-CM | POA: Diagnosis not present

## 2020-09-20 DIAGNOSIS — R69 Illness, unspecified: Secondary | ICD-10-CM | POA: Diagnosis not present

## 2020-09-26 DIAGNOSIS — K56609 Unspecified intestinal obstruction, unspecified as to partial versus complete obstruction: Secondary | ICD-10-CM | POA: Diagnosis not present

## 2020-09-26 DIAGNOSIS — Z933 Colostomy status: Secondary | ICD-10-CM | POA: Diagnosis not present

## 2020-10-25 DIAGNOSIS — Z933 Colostomy status: Secondary | ICD-10-CM | POA: Diagnosis not present

## 2020-10-25 DIAGNOSIS — K56609 Unspecified intestinal obstruction, unspecified as to partial versus complete obstruction: Secondary | ICD-10-CM | POA: Diagnosis not present

## 2020-11-18 ENCOUNTER — Ambulatory Visit: Payer: Medicare HMO

## 2020-11-25 ENCOUNTER — Telehealth: Payer: Self-pay | Admitting: *Deleted

## 2020-11-25 ENCOUNTER — Other Ambulatory Visit: Payer: Self-pay | Admitting: Internal Medicine

## 2020-11-25 ENCOUNTER — Ambulatory Visit (INDEPENDENT_AMBULATORY_CARE_PROVIDER_SITE_OTHER): Payer: Medicare HMO

## 2020-11-25 VITALS — Ht 64.0 in | Wt 199.0 lb

## 2020-11-25 DIAGNOSIS — Z0000 Encounter for general adult medical examination without abnormal findings: Secondary | ICD-10-CM | POA: Diagnosis not present

## 2020-11-25 NOTE — Patient Instructions (Addendum)
Heidi Whitaker , Thank you for taking time to come for your Medicare Wellness Visit. I appreciate your ongoing commitment to your health goals. Please review the following plan we discussed and let me know if I can assist you in the future.   These are the goals we discussed: Goals    . Follow up with Primary Care Provider     As needed       This is a list of the screening recommended for you and due dates:  Health Maintenance  Topic Date Due  . Hemoglobin A1C  09/05/2020  . DEXA scan (bone density measurement)  11/25/2021*  . Colon Cancer Screening  11/25/2021*  . Tetanus Vaccine  11/25/2021*  . Complete foot exam   03/05/2021  . Eye exam for diabetics  05/15/2021  . Flu Shot  Completed  . COVID-19 Vaccine  Completed  . Pneumonia vaccines  Completed  *Topic was postponed. The date shown is not the original due date.    Immunizations Immunization History  Administered Date(s) Administered  . Fluad Quad(high Dose 65+) 09/08/2019  . Influenza, High Dose Seasonal PF 09/30/2018  . PFIZER SARS-COV-2 Vaccination 01/08/2020, 01/29/2020  . Pneumococcal Conjugate-13 01/01/2018  . Pneumococcal Polysaccharide-23 11/01/2019   Keep all routine maintenance appointments.   Follow up 11/29/20 @ 10:00; daughter to accompany.  Advanced directives: End of life planning; Advance aging; Advanced directives discussed.  Copy of current HCPOA/Living Will requested.    Follow up in one year for your annual wellness visit.   Preventive Care 18 Years and Older, Female Preventive care refers to lifestyle choices and visits with your health care provider that can promote health and wellness. What does preventive care include?  A yearly physical exam. This is also called an annual well check.  Dental exams once or twice a year.  Routine eye exams. Ask your health care provider how often you should have your eyes checked.  Personal lifestyle choices, including:  Daily care of your teeth and  gums.  Regular physical activity.  Eating a healthy diet.  Avoiding tobacco and drug use.  Limiting alcohol use.  Practicing safe sex.  Taking low-dose aspirin every day.  Taking vitamin and mineral supplements as recommended by your health care provider. What happens during an annual well check? The services and screenings done by your health care provider during your annual well check will depend on your age, overall health, lifestyle risk factors, and family history of disease. Counseling  Your health care provider may ask you questions about your:  Alcohol use.  Tobacco use.  Drug use.  Emotional well-being.  Home and relationship well-being.  Sexual activity.  Eating habits.  History of falls.  Memory and ability to understand (cognition).  Work and work Statistician.  Reproductive health. Screening  You may have the following tests or measurements:  Height, weight, and BMI.  Blood pressure.  Lipid and cholesterol levels. These may be checked every 5 years, or more frequently if you are over 39 years old.  Skin check.  Lung cancer screening. You may have this screening every year starting at age 18 if you have a 30-pack-year history of smoking and currently smoke or have quit within the past 15 years.  Fecal occult blood test (FOBT) of the stool. You may have this test every year starting at age 32.  Flexible sigmoidoscopy or colonoscopy. You may have a sigmoidoscopy every 5 years or a colonoscopy every 10 years starting at age 49.  Hepatitis  C blood test.  Hepatitis B blood test.  Sexually transmitted disease (STD) testing.  Diabetes screening. This is done by checking your blood sugar (glucose) after you have not eaten for a while (fasting). You may have this done every 1-3 years.  Bone density scan. This is done to screen for osteoporosis. You may have this done starting at age 47.  Mammogram. This may be done every 1-2 years. Talk to your  health care provider about how often you should have regular mammograms. Talk with your health care provider about your test results, treatment options, and if necessary, the need for more tests. Vaccines  Your health care provider may recommend certain vaccines, such as:  Influenza vaccine. This is recommended every year.  Tetanus, diphtheria, and acellular pertussis (Tdap, Td) vaccine. You may need a Td booster every 10 years.  Zoster vaccine. You may need this after age 6.  Pneumococcal 13-valent conjugate (PCV13) vaccine. One dose is recommended after age 49.  Pneumococcal polysaccharide (PPSV23) vaccine. One dose is recommended after age 47. Talk to your health care provider about which screenings and vaccines you need and how often you need them. This information is not intended to replace advice given to you by your health care provider. Make sure you discuss any questions you have with your health care provider. Document Released: 01/10/2016 Document Revised: 09/02/2016 Document Reviewed: 10/15/2015 Elsevier Interactive Patient Education  2017 [place] Prevention in the Home Falls can cause injuries. They can happen to people of all ages. There are many things you can do to make your home safe and to help prevent falls. What can I do on the outside of my home?  Regularly fix the edges of walkways and driveways and fix any cracks.  Remove anything that might make you trip as you walk through a door, such as a raised step or threshold.  Trim any bushes or trees on the path to your home.  Use bright outdoor lighting.  Clear any walking paths of anything that might make someone trip, such as rocks or tools.  Regularly check to see if handrails are loose or broken. Make sure that both sides of any steps have handrails.  Any raised decks and porches should have guardrails on the edges.  Have any leaves, snow, or ice cleared regularly.  Use sand or salt on walking  paths during winter.  Clean up any spills in your garage right away. This includes oil or grease spills. What can I do in the bathroom?  Use night lights.  Install grab bars by the toilet and in the tub and shower. Do not use towel bars as grab bars.  Use non-skid mats or decals in the tub or shower.  If you need to sit down in the shower, use a plastic, non-slip stool.  Keep the floor dry. Clean up any water that spills on the floor as soon as it happens.  Remove soap buildup in the tub or shower regularly.  Attach bath mats securely with double-sided non-slip rug tape.  Do not have throw rugs and other things on the floor that can make you trip. What can I do in the bedroom?  Use night lights.  Make sure that you have a light by your bed that is easy to reach.  Do not use any sheets or blankets that are too big for your bed. They should not hang down onto the floor.  Have a firm chair that has side arms.  You can use this for support while you get dressed.  Do not have throw rugs and other things on the floor that can make you trip. What can I do in the kitchen?  Clean up any spills right away.  Avoid walking on wet floors.  Keep items that you use a lot in easy-to-reach places.  If you need to reach something above you, use a strong step stool that has a grab bar.  Keep electrical cords out of the way.  Do not use floor polish or wax that makes floors slippery. If you must use wax, use non-skid floor wax.  Do not have throw rugs and other things on the floor that can make you trip. What can I do with my stairs?  Do not leave any items on the stairs.  Make sure that there are handrails on both sides of the stairs and use them. Fix handrails that are broken or loose. Make sure that handrails are as long as the stairways.  Check any carpeting to make sure that it is firmly attached to the stairs. Fix any carpet that is loose or worn.  Avoid having throw rugs at  the top or bottom of the stairs. If you do have throw rugs, attach them to the floor with carpet tape.  Make sure that you have a light switch at the top of the stairs and the bottom of the stairs. If you do not have them, ask someone to add them for you. What else can I do to help prevent falls?  Wear shoes that:  Do not have high heels.  Have rubber bottoms.  Are comfortable and fit you well.  Are closed at the toe. Do not wear sandals.  If you use a stepladder:  Make sure that it is fully opened. Do not climb a closed stepladder.  Make sure that both sides of the stepladder are locked into place.  Ask someone to hold it for you, if possible.  Clearly mark and make sure that you can see:  Any grab bars or handrails.  First and last steps.  Where the edge of each step is.  Use tools that help you move around (mobility aids) if they are needed. These include:  Canes.  Walkers.  Scooters.  Crutches.  Turn on the lights when you go into a dark area. Replace any light bulbs as soon as they burn out.  Set up your furniture so you have a clear path. Avoid moving your furniture around.  If any of your floors are uneven, fix them.  If there are any pets around you, be aware of where they are.  Review your medicines with your doctor. Some medicines can make you feel dizzy. This can increase your chance of falling. Ask your doctor what other things that you can do to help prevent falls. This information is not intended to replace advice given to you by your health care provider. Make sure you discuss any questions you have with your health care provider. Document Released: 10/10/2009 Document Revised: 05/21/2016 Document Reviewed: 01/18/2015 Elsevier Interactive Patient Education  2017 [employer].

## 2020-11-25 NOTE — Progress Notes (Addendum)
Subjective:   Heidi Whitaker is a 86 y.o. female who presents for Medicare Annual (Subsequent) preventive examination.  Review of Systems    No ROS.  Medicare Wellness Virtual Visit.    Cardiac Risk Factors include: advanced age (>63men, >65 women);diabetes mellitus;hypertension     Objective:    Today's Vitals   11/25/20 1241  Weight: 199 lb (90.3 kg)  Height: 5\' 4"  (1.626 m)   Body mass index is 34.16 kg/m.  Advanced Directives 11/25/2020 11/16/2019 06/02/2019 05/18/2019 05/11/2019 03/13/2019 01/09/2019  Does Patient Have a Medical Advance Directive? Yes No No No No No Yes  Type of Paramedic of [place];Living will - - - - - Living will  Does patient want to make changes to medical advance directive? No - Patient declined - - - - - -  Copy of [place] in Chart? No - copy requested - - - - - -  Would patient like information on creating a medical advance directive? - No - Patient declined - No - Patient declined - Yes (MAU/Ambulatory/Procedural Areas - Information given) -    Current Medications (verified) Outpatient Encounter Medications as of 11/25/2020  Medication Sig   aspirin 81 MG chewable tablet Chew 81 mg by mouth daily.   Cholecalciferol 100 MCG (4000 UT) CAPS Take by mouth.   Cholecalciferol 100 MCG (4000 UT) CAPS Take by mouth.   furosemide (LASIX) 20 MG tablet Take 1 tablet (20 mg total) by mouth daily as needed.   glimepiride (AMARYL) 2 MG tablet Take 1 tablet (2 mg total) by mouth daily with breakfast.   lisinopril (ZESTRIL) 20 MG tablet Take 1 tablet (20 mg total) by mouth daily. Do not cut in 1/2 dose increased dose to 20 mg   lovastatin (MEVACOR) 20 MG tablet Take 1 tablet (20 mg total) by mouth daily at 6 PM.   metoprolol succinate (TOPROL-XL) 100 MG 24 hr tablet Take 1 tablet (100 mg total) by mouth daily. Take with or immediately following a meal.   traZODone (DESYREL) 50 MG tablet Take 1 tablet (50 mg total)  by mouth at bedtime as needed for sleep.   No facility-administered encounter medications on file as of 11/25/2020.    Allergies (verified) Metformin and related   History: Past Medical History:  Diagnosis Date   Diabetes mellitus without complication ([place])    DVT (deep venous thrombosis) ([place])    06/02/2019   Hard of hearing    Hypertension    Past Surgical History:  Procedure Laterality Date   CESAREAN SECTION     COLONOSCOPY N/A 05/17/2019   Procedure: FLEXIBLE SIGMOIDOSCOPY;  Surgeon: Ileana Roup, MD;  Location: [facility];  Service: General;  Laterality: N/A;   COLONOSCOPY WITH PROPOFOL N/A 01/09/2019   Procedure: COLONOSCOPY WITH PROPOFOL;  Surgeon: Lin Landsman, MD;  Location: [facility] ENDOSCOPY;  Service: Gastroenterology;  Laterality: N/A;   ESOPHAGOGASTRODUODENOSCOPY (EGD) WITH PROPOFOL N/A 01/09/2019   Procedure: ESOPHAGOGASTRODUODENOSCOPY (EGD) WITH PROPOFOL;  Surgeon: Lin Landsman, MD;  Location: [facility] ENDOSCOPY;  Service: Gastroenterology;  Laterality: N/A;   KNEE SURGERY     left 2017/2018   SHOULDER SURGERY     right   XI ROBOTIC ASSISTED LOWER ANTERIOR RESECTION N/A 05/17/2019   Procedure: XI ROBOTIC ASSISTED LOWER ANTERIOR RESECTION WITH END COLOSTOMY;  Surgeon: Ileana Roup, MD;  Location: [facility];  Service: General;  Laterality: N/A;   Family History  Problem Relation Age of Onset   Addison's  disease Maternal Aunt    Social History   Socioeconomic History   Marital status: Widowed    Spouse name: Not on file   Number of children: Not on file   Years of education: Not on file   Highest education level: Not on file  Occupational History   Not on file  Tobacco Use   Smoking status: Former Smoker    Years: 4.00   Smokeless tobacco: Never Used   Tobacco comment: 3 per day  Vaping Use   Vaping Use: Never used  Substance and Sexual Activity   Alcohol use: No   Drug use: No   Sexual activity: Not on file  Other Topics Concern   Not  on file  Social History Narrative   Retired Pharmacist, hospital    2 daughters lives with [place]    1 son deceased    Widowed    Moved from [place] Resource Strain: Low Risk    Difficulty of Paying Living Expenses: Not hard at all  Food Insecurity: No Food Insecurity   Worried About Charity fundraiser in the Last Year: Never true   Arboriculturist in the Last Year: Never true  Transportation Needs: No Transportation Needs   Lack of Transportation (Medical): No   Lack of Transportation (Non-Medical): No  Physical Activity: Insufficiently Active   Days of Exercise per Week: 3 days   Minutes of Exercise per Session: 20 min  Stress: No Stress Concern Present   Feeling of Stress : Not at all  Social Connections: Unknown   Frequency of Communication with Friends and Family: More than three times a week   Frequency of Social Gatherings with Friends and Family: More than three times a week   Attends Religious Services: Not on Electrical engineer or Organizations: Not on file   Attends Archivist Meetings: Not on file   Marital Status: Not on file    Tobacco Counseling Counseling given: Not Answered Comment: 3 per day   Clinical Intake:  Pre-visit preparation completed: Yes        Diabetes: Yes (Followed by PCP)  How often do you need to have someone help you when you read instructions, pamphlets, or other written materials from your doctor or pharmacy?: 2 - Rarely  Nutrition Risk Assessment: Has the patient had any N/V/D within the last 2 months?  No  Does the patient have any non-healing wounds?  No  Has the patient had any unintentional weight loss or weight gain?  No   Diabetes: If diabetic, was a CBG obtained today?  No  Did the patient bring in their glucometer from home?  No  How often do you monitor your CBG's? Does not monitor at home.   Financial Strains and Diabetes Management: Are you having any financial  strains with the device, your supplies or your medication? No .  Does the patient want to be seen by Chronic Care Management for management of their diabetes?  No  Would the patient like to be referred to a Nutritionist or for Diabetic Management?  No   Diabetic Exams: Diabetic Eye Exam: Completed 05/15/20    Diabetic Foot Exam: Completed 03/05/20  Interpreter Needed?: No      Activities of Daily Living In your present state of health, do you have any difficulty performing the following activities: 11/25/2020  Hearing? Y  Comment Hearing aids  Vision? N  Difficulty concentrating or making decisions? Y  Walking or climbing stairs? N  Dressing or bathing? N  Doing errands, shopping? Y  Preparing Food and eating ? N  Using the Toilet? N  In the past six months, have you accidently leaked urine? N  Do you have problems with loss of bowel control? N  Managing your Medications? N  Managing your Finances? Y  Comment Daughter assist  Housekeeping or managing your Housekeeping? N  Comment Daughter assist as needed  Some recent data might be hidden    Patient Care Team: McLean-Scocuzza, Nino Glow, MD as PCP - General (Internal Medicine)  Indicate any recent Medical Services you may have received from other than [facility] providers in the past year (date may be approximate).     Assessment:   This is a routine wellness examination for Avriel.  I connected with Jacynda today by telephone and verified that I am speaking with the correct person using two identifiers. Location patient: home Location provider: work Persons participating in the virtual visit: patient, daughter, Marine scientist.    I discussed the limitations, risks, security and privacy concerns of performing an evaluation and management service by telephone and the availability of in person appointments.The patient expressed understanding and verbally consented to this telephonic visit.    Interactive audio and video  telecommunications were attempted between this provider and patient, however failed, due to patient having technical difficulties OR patient did not have access to video capability.  We continued and completed visit with audio only.  Some vital signs may be absent or patient reported.   Hearing/Vision screen  Hearing Screening   125Hz  250Hz  500Hz  1000Hz  2000Hz  3000Hz  4000Hz  6000Hz  8000Hz   Right ear:           Left ear:           Comments: Hearing aids  Vision Screening Comments: Virtual visit  Dietary issues and exercise activities discussed: Current Exercise Habits: Home exercise routine, Type of exercise: walking, Intensity: Mild  Goals      Follow up with Primary Care Provider     As needed       Depression Screen PHQ 2/9 Scores 11/25/2020 03/05/2020 11/16/2019 09/30/2018  PHQ - 2 Score 0 0 0 0    Fall Risk Fall Risk  11/25/2020 03/05/2020 11/16/2019 09/30/2018 08/12/2018  Falls in the past year? 0 0 0 Yes No  Number falls in past yr: 0 - 0 2 or more -  Injury with Fall? 0 - - No -  Risk Factor Category  - - - High Fall Risk -  Risk for fall due to : - - - History of fall(s) -  Follow up Falls evaluation completed - Falls prevention discussed - -   Handrails in use when climbing stairs? Yes Home free of loose throw rugs in walkways, pet beds, electrical cords, etc? Yes  Adequate lighting in your home to reduce risk of falls? Yes   ASSISTIVE DEVICES UTILIZED TO PREVENT FALLS: Life alert? No  Use of a cane, walker or w/c? No   Grab bars in the bathroom? Yes  Shower chair or bench in shower? Yes  Elevated toilet seat or a handicapped toilet? Yes   TIMED UP AND GO: Was the test performed? No . Virtual visit.  Cognitive Function:  MMSE/6CIT declined.  Daughter notes S/S dementia worsening. Reports patient is more forgetful in conversation, forgetting daily routine things and temper.  Appointment scheduled with PCP for follow up.    6CIT  Screen 11/16/2019  What Year? 0  points  What month? 0 points  What time? 0 points  Count back from 20 0 points  Months in reverse 0 points    Immunizations Immunization History  Administered Date(s) Administered   Fluad Quad(high Dose 65+) 09/08/2019, 09/24/2020   Influenza, High Dose Seasonal PF 09/30/2018   PFIZER SARS-COV-2 Vaccination 01/08/2020, 01/29/2020   Pneumococcal Conjugate-13 01/01/2018   Pneumococcal Polysaccharide-23 11/01/2019   Tdap vaccine- Advised may receive this vaccine at local pharmacy or Health Dept. Aware to provide a copy of the vaccination record if obtained from local pharmacy or Health Dept. Verbalized acceptance and understanding. Deferred.  Health Maintenance Health Maintenance  Topic Date Due   HEMOGLOBIN A1C  09/05/2020   DEXA SCAN  11/25/2021 (Originally 12/03/1996)   COLONOSCOPY  11/25/2021 (Originally 05/16/2020)   TETANUS/TDAP  11/25/2021 (Originally 12/03/1948)   FOOT EXAM  03/05/2021   OPHTHALMOLOGY EXAM  05/15/2021   INFLUENZA VACCINE  Completed   COVID-19 Vaccine  Completed   PNA vac Low Risk Adult  Completed   Colorectal cancer screening: No longer required.    Mammogram status: No longer required.    Bone density- deferred.   Lung Cancer Screening: (Low Dose CT Chest recommended if Age 78-80 years, 30 pack-year currently smoking OR have quit w/in 15years.) does not qualify.   Vision Screening: Recommended annual ophthalmology exams for early detection of glaucoma and other disorders of the eye. Is the patient up to date with their annual eye exam?  Yes   Dental Screening: Recommended annual dental exams for proper oral hygiene. Dentures.   Community Resource Referral / Chronic Care Management: CRR required this visit?  No   CCM required this visit?  No      Plan:   Keep all routine maintenance appointments.   Follow up 11/29/20 @ 10:00; daughter to accompany.  I have personally reviewed and noted the following in the patient's chart:   Medical and  social history Use of alcohol, tobacco or illicit drugs  Current medications and supplements Functional ability and status Nutritional status Physical activity Advanced directives List of other physicians Hospitalizations, surgeries, and ER visits in previous 12 months Vitals Screenings to include cognitive, depression, and falls Referrals and appointments  In addition, I have reviewed and discussed with patient certain preventive protocols, quality metrics, and best practice recommendations. A written personalized care plan for preventive services as well as general preventive health recommendations were provided to patient via mychart.     OBrien-Blaney, Kedric Bumgarner L, LPN   [id number]    I have reviewed the above information and agree with above.   Deborra Medina, MD

## 2020-11-25 NOTE — Addendum Note (Signed)
Addended by: Orland Mustard on: 11/25/2020 04:50 PM   Modules accepted: Orders

## 2020-11-25 NOTE — Telephone Encounter (Signed)
Please place future orders for lab appt.   The only orders found are below  Open Future Orders   Expected Expires Ordered Perform. Dept.  Urinalysis, Routine w reflex microscopic [[id number]] 03/05/20 09/05/21 03/05/20   Auth. provider:  McLean-Scocuzza, Nino Glow, MD  Assoc. diagnoses:  Acute cystitis without hematuria  Urine Culture [[id number]] 03/05/20 03/05/21 03/05/20   Auth. provider:  McLean-Scocuzza, Nino Glow, MD  Assoc. diagnoses:  Acute cystitis without hematuria

## 2020-11-27 ENCOUNTER — Other Ambulatory Visit (INDEPENDENT_AMBULATORY_CARE_PROVIDER_SITE_OTHER): Payer: Medicare HMO

## 2020-11-27 ENCOUNTER — Other Ambulatory Visit: Payer: Self-pay

## 2020-11-27 DIAGNOSIS — E559 Vitamin D deficiency, unspecified: Secondary | ICD-10-CM

## 2020-11-27 DIAGNOSIS — Z1329 Encounter for screening for other suspected endocrine disorder: Secondary | ICD-10-CM

## 2020-11-27 DIAGNOSIS — E119 Type 2 diabetes mellitus without complications: Secondary | ICD-10-CM | POA: Diagnosis not present

## 2020-11-27 DIAGNOSIS — N3000 Acute cystitis without hematuria: Secondary | ICD-10-CM

## 2020-11-27 DIAGNOSIS — I152 Hypertension secondary to endocrine disorders: Secondary | ICD-10-CM | POA: Diagnosis not present

## 2020-11-27 DIAGNOSIS — E1159 Type 2 diabetes mellitus with other circulatory complications: Secondary | ICD-10-CM

## 2020-11-27 LAB — LIPID PANEL
Cholesterol: 141 mg/dL (ref 0–200)
HDL: 43.8 mg/dL (ref >39.00)
LDL Cholesterol: 78 mg/dL (ref 0–99)
NonHDL: 97.38
Total CHOL/HDL Ratio: 3
Triglycerides: 98 mg/dL (ref 0.0–149.0)
VLDL: 19.6 mg/dL (ref 0.0–40.0)

## 2020-11-27 LAB — COMPREHENSIVE METABOLIC PANEL
ALT: 9 U/L (ref 0–35)
AST: 15 U/L (ref 0–37)
Albumin: 3.9 g/dL (ref 3.5–5.2)
Alkaline Phosphatase: 43 U/L (ref 39–117)
BUN: 23 mg/dL (ref 6–23)
CO2: 30 mEq/L (ref 19–32)
Calcium: 9.9 mg/dL (ref 8.4–10.5)
Chloride: 104 mEq/L (ref 96–112)
Creatinine, Ser: 1.04 mg/dL (ref 0.40–1.20)
GFR: 47.83 mL/min — ABNORMAL LOW (ref >60.00)
Glucose, Bld: 95 mg/dL (ref 70–99)
Potassium: 4.4 mEq/L (ref 3.5–5.1)
Sodium: 141 mEq/L (ref 135–145)
Total Bilirubin: 0.7 mg/dL (ref 0.2–1.2)
Total Protein: 6.5 g/dL (ref 6.0–8.3)

## 2020-11-27 LAB — CBC WITH DIFFERENTIAL/PLATELET
Basophils Absolute: 0.1 10*3/uL (ref 0.0–0.1)
Basophils Relative: 0.6 % (ref 0.0–3.0)
Eosinophils Absolute: 0.6 10*3/uL (ref 0.0–0.7)
Eosinophils Relative: 5.7 % — ABNORMAL HIGH (ref 0.0–5.0)
HCT: 38 % (ref 36.0–46.0)
Hemoglobin: 12.7 g/dL (ref 12.0–15.0)
Lymphocytes Relative: 35.7 % (ref 12.0–46.0)
Lymphs Abs: 3.6 10*3/uL (ref 0.7–4.0)
MCHC: 33.4 g/dL (ref 30.0–36.0)
MCV: 92.1 fl (ref 78.0–100.0)
Monocytes Absolute: 1.4 10*3/uL — ABNORMAL HIGH (ref 0.1–1.0)
Monocytes Relative: 14.3 % — ABNORMAL HIGH (ref 3.0–12.0)
Neutro Abs: 4.4 10*3/uL (ref 1.4–7.7)
Neutrophils Relative %: 43.7 % (ref 43.0–77.0)
Platelets: 258 10*3/uL (ref 150.0–400.0)
RBC: 4.12 Mil/uL (ref 3.87–5.11)
RDW: 14 % (ref 11.5–15.5)
WBC: 10.1 10*3/uL (ref 4.0–10.5)

## 2020-11-27 LAB — HEMOGLOBIN A1C: Hgb A1c MFr Bld: 6.7 % — ABNORMAL HIGH (ref 4.6–6.5)

## 2020-11-27 LAB — VITAMIN D 25 HYDROXY (VIT D DEFICIENCY, FRACTURES): VITD: 64.72 ng/mL (ref 30.00–100.00)

## 2020-11-27 LAB — TSH: TSH: 3.5 u[IU]/mL (ref 0.35–4.50)

## 2020-11-28 DIAGNOSIS — K56609 Unspecified intestinal obstruction, unspecified as to partial versus complete obstruction: Secondary | ICD-10-CM | POA: Diagnosis not present

## 2020-11-28 DIAGNOSIS — Z933 Colostomy status: Secondary | ICD-10-CM | POA: Diagnosis not present

## 2020-11-29 ENCOUNTER — Encounter: Payer: Self-pay | Admitting: Internal Medicine

## 2020-11-29 ENCOUNTER — Other Ambulatory Visit: Payer: Self-pay

## 2020-11-29 ENCOUNTER — Ambulatory Visit (INDEPENDENT_AMBULATORY_CARE_PROVIDER_SITE_OTHER): Payer: Medicare HMO | Admitting: Internal Medicine

## 2020-11-29 VITALS — BP 124/66 | HR 57 | Temp 97.5°F | Ht 64.0 in | Wt 190.8 lb

## 2020-11-29 DIAGNOSIS — N1831 Chronic kidney disease, stage 3a: Secondary | ICD-10-CM | POA: Insufficient documentation

## 2020-11-29 DIAGNOSIS — G4700 Insomnia, unspecified: Secondary | ICD-10-CM | POA: Diagnosis not present

## 2020-11-29 DIAGNOSIS — R600 Localized edema: Secondary | ICD-10-CM | POA: Diagnosis not present

## 2020-11-29 DIAGNOSIS — K9409 Other complications of colostomy: Secondary | ICD-10-CM | POA: Diagnosis not present

## 2020-11-29 DIAGNOSIS — E1122 Type 2 diabetes mellitus with diabetic chronic kidney disease: Secondary | ICD-10-CM | POA: Diagnosis not present

## 2020-11-29 DIAGNOSIS — H6123 Impacted cerumen, bilateral: Secondary | ICD-10-CM | POA: Diagnosis not present

## 2020-11-29 DIAGNOSIS — J3489 Other specified disorders of nose and nasal sinuses: Secondary | ICD-10-CM

## 2020-11-29 DIAGNOSIS — Z1283 Encounter for screening for malignant neoplasm of skin: Secondary | ICD-10-CM

## 2020-11-29 DIAGNOSIS — I152 Hypertension secondary to endocrine disorders: Secondary | ICD-10-CM | POA: Insufficient documentation

## 2020-11-29 DIAGNOSIS — N3000 Acute cystitis without hematuria: Secondary | ICD-10-CM | POA: Diagnosis not present

## 2020-11-29 DIAGNOSIS — N183 Chronic kidney disease, stage 3 unspecified: Secondary | ICD-10-CM

## 2020-11-29 DIAGNOSIS — I10 Essential (primary) hypertension: Secondary | ICD-10-CM | POA: Diagnosis not present

## 2020-11-29 DIAGNOSIS — E119 Type 2 diabetes mellitus without complications: Secondary | ICD-10-CM | POA: Diagnosis not present

## 2020-11-29 DIAGNOSIS — E1159 Type 2 diabetes mellitus with other circulatory complications: Secondary | ICD-10-CM | POA: Diagnosis not present

## 2020-11-29 DIAGNOSIS — I700 Atherosclerosis of aorta: Secondary | ICD-10-CM | POA: Insufficient documentation

## 2020-11-29 MED ORDER — LOVASTATIN 20 MG PO TABS: 20.0000 mg | ORAL_TABLET | Freq: Every day | ORAL | 3 refills | Status: DC

## 2020-11-29 MED ORDER — METOPROLOL SUCCINATE ER 100 MG PO TB24: 100.0000 mg | ORAL_TABLET | Freq: Every day | ORAL | 3 refills | Status: DC

## 2020-11-29 MED ORDER — TRAZODONE HCL 50 MG PO TABS: 50.0000 mg | ORAL_TABLET | Freq: Every evening | ORAL | 3 refills | Status: DC | PRN

## 2020-11-29 MED ORDER — GLIMEPIRIDE 2 MG PO TABS: 2.0000 mg | ORAL_TABLET | Freq: Every day | ORAL | 3 refills | Status: DC

## 2020-11-29 MED ORDER — FUROSEMIDE 20 MG PO TABS: 20.0000 mg | ORAL_TABLET | Freq: Every day | ORAL | 3 refills | Status: DC | PRN

## 2020-11-29 MED ORDER — LISINOPRIL 20 MG PO TABS: 20.0000 mg | ORAL_TABLET | Freq: Every day | ORAL | 3 refills | Status: DC

## 2020-11-29 NOTE — Patient Instructions (Signed)
Increase water intake 50 to 64 ounces

## 2020-11-29 NOTE — Progress Notes (Signed)
Chief Complaint  Patient presents with  . Follow-up   F/u with daughter deborah 1. Reduced hearing with increased wax build up and wearing hearing aids agreeable ear wax removal  2. Colostomy with hernia saw Dr. Dema Severin but declines to see further and wants 2nd opinion with Dr. Hampton Abbot 12/2020 daughter reports abdominal pain around colostomy hernia increased in size and bag bursting or having to change frequently  3. Reviewed labs  DM 2 on amaryl 2 mg lis 20 mg qd toprol xl 100 mg qd mevacor 20 mg qd BP controlled today wants refills of all meds   Review of Systems  Constitutional: Negative for weight loss.  HENT: Positive for hearing loss.   Eyes: Negative for blurred vision.  Respiratory: Negative for shortness of breath.   Cardiovascular: Negative for chest pain.  Gastrointestinal: Positive for abdominal pain.  Musculoskeletal: Negative for falls and joint pain.  Skin: Negative for rash.  Psychiatric/Behavioral: Negative for memory loss.   Past Medical History:  Diagnosis Date  . Diabetes mellitus without complication ([place])   . DVT (deep venous thrombosis) ([place])    06/02/2019  . Hard of hearing   . Hypertension    Past Surgical History:  Procedure Laterality Date  . CESAREAN SECTION    . COLONOSCOPY N/A 05/17/2019   Procedure: FLEXIBLE SIGMOIDOSCOPY;  Surgeon: Ileana Roup, MD;  Location: [facility];  Service: General;  Laterality: N/A;  . COLONOSCOPY WITH PROPOFOL N/A 01/09/2019   Procedure: COLONOSCOPY WITH PROPOFOL;  Surgeon: Lin Landsman, MD;  Location: [facility] ENDOSCOPY;  Service: Gastroenterology;  Laterality: N/A;  . ESOPHAGOGASTRODUODENOSCOPY (EGD) WITH PROPOFOL N/A 01/09/2019   Procedure: ESOPHAGOGASTRODUODENOSCOPY (EGD) WITH PROPOFOL;  Surgeon: Lin Landsman, MD;  Location: [facility] ENDOSCOPY;  Service: Gastroenterology;  Laterality: N/A;  . KNEE SURGERY     left 2017/2018  . SHOULDER SURGERY     right  . XI ROBOTIC ASSISTED LOWER ANTERIOR RESECTION N/A  05/17/2019   Procedure: XI ROBOTIC ASSISTED LOWER ANTERIOR RESECTION WITH END COLOSTOMY;  Surgeon: Ileana Roup, MD;  Location: [facility];  Service: General;  Laterality: N/A;   Family History  Problem Relation Age of Onset  . Addison's disease Maternal Aunt    Social History   Socioeconomic History  . Marital status: Widowed    Spouse name: Not on file  . Number of children: Not on file  . Years of education: Not on file  . Highest education level: Not on file  Occupational History  . Not on file  Tobacco Use  . Smoking status: Former Smoker    Years: 4.00  . Smokeless tobacco: Never Used  . Tobacco comment: 3 per day  Vaping Use  . Vaping Use: Never used  Substance and Sexual Activity  . Alcohol use: No  . Drug use: No  . Sexual activity: Not on file  Other Topics Concern  . Not on file  Social History Narrative   Retired Pharmacist, hospital    2 daughters lives with [name]    1 son deceased    Widowed    Moved from [place] Strain: [place]   . Difficulty of Paying Living Expenses: Not hard at all  Food Insecurity: No Food Insecurity  . Worried About Charity fundraiser in the Last Year: Never true  . Ran Out of Food in the Last Year: Never true  Transportation Needs: No Transportation Needs  . Lack of Transportation (Medical): No  .  Lack of Transportation (Non-Medical): No  Physical Activity: Insufficiently Active  . Days of Exercise per Week: 3 days  . Minutes of Exercise per Session: 20 min  Stress: No Stress Concern Present  . Feeling of Stress : Not at all  Social Connections: Unknown  . Frequency of Communication with Friends and Family: More than three times a week  . Frequency of Social Gatherings with Friends and Family: More than three times a week  . Attends Religious Services: Not on file  . Active Member of Clubs or Organizations: Not on file  . Attends Archivist Meetings: Not on file  .  Marital Status: Not on file  Intimate Partner Violence: Not At Risk  . Fear of Current or Ex-Partner: No  . Emotionally Abused: No  . Physically Abused: No  . Sexually Abused: No   Current Meds  Medication Sig  . aspirin 81 MG chewable tablet Chew 81 mg by mouth daily.  . Cholecalciferol 100 MCG (4000 UT) CAPS Take by mouth.  . Cholecalciferol 100 MCG (4000 UT) CAPS Take by mouth.  . furosemide (LASIX) 20 MG tablet Take 1 tablet (20 mg total) by mouth daily as needed.  Marland Kitchen glimepiride (AMARYL) 2 MG tablet Take 1 tablet (2 mg total) by mouth daily with breakfast.  . lisinopril (ZESTRIL) 20 MG tablet Take 1 tablet (20 mg total) by mouth daily. Do not cut in 1/2 dose increased dose to 20 mg  . lovastatin (MEVACOR) 20 MG tablet Take 1 tablet (20 mg total) by mouth daily at 6 PM.  . metoprolol succinate (TOPROL-XL) 100 MG 24 hr tablet Take 1 tablet (100 mg total) by mouth daily. Take with or immediately following a meal.  . traZODone (DESYREL) 50 MG tablet Take 1 tablet (50 mg total) by mouth at bedtime as needed for sleep.  . [DISCONTINUED] furosemide (LASIX) 20 MG tablet Take 1 tablet (20 mg total) by mouth daily as needed.  . [DISCONTINUED] glimepiride (AMARYL) 2 MG tablet Take 1 tablet (2 mg total) by mouth daily with breakfast.  . [DISCONTINUED] lisinopril (ZESTRIL) 20 MG tablet Take 1 tablet (20 mg total) by mouth daily. Do not cut in 1/2 dose increased dose to 20 mg  . [DISCONTINUED] lovastatin (MEVACOR) 20 MG tablet Take 1 tablet (20 mg total) by mouth daily at 6 PM.  . [DISCONTINUED] metoprolol succinate (TOPROL-XL) 100 MG 24 hr tablet Take 1 tablet (100 mg total) by mouth daily. Take with or immediately following a meal.  . [DISCONTINUED] traZODone (DESYREL) 50 MG tablet Take 1 tablet (50 mg total) by mouth at bedtime as needed for sleep.   Allergies  Allergen Reactions  . Metformin And Related     Diarrhea    Recent Results (from the past 2160 hour(s))  Vitamin D (25 hydroxy)      Status: None   Collection Time: 11/27/20  8:21 AM  Result Value Ref Range   VITD 64.72 30.00 - 100.00 ng/mL  TSH     Status: None   Collection Time: 11/27/20  8:21 AM  Result Value Ref Range   TSH 3.50 0.35 - 4.50 uIU/mL  Hemoglobin A1c     Status: Abnormal   Collection Time: 11/27/20  8:21 AM  Result Value Ref Range   Hgb A1c MFr Bld 6.7 (H) 4.6 - 6.5 %    Comment: Glycemic Control Guidelines for People with Diabetes:Non Diabetic:  <6%Goal of Therapy: <7%Additional Action Suggested:  >8%   CBC with Differential/Platelet  Status: Abnormal   Collection Time: 11/27/20  8:21 AM  Result Value Ref Range   WBC 10.1 4.0 - 10.5 K/uL   RBC 4.12 3.87 - 5.11 Mil/uL   Hemoglobin 12.7 12.0 - 15.0 g/dL   HCT 38.0 36 - 46 %   MCV 92.1 78.0 - 100.0 fl   MCHC 33.4 30.0 - 36.0 g/dL   RDW 14.0 11.5 - 15.5 %   Platelets 258.0 150 - 400 K/uL   Neutrophils Relative % 43.7 43 - 77 %   Lymphocytes Relative 35.7 12 - 46 %   Monocytes Relative 14.3 (H) 3 - 12 %   Eosinophils Relative 5.7 (H) 0 - 5 %   Basophils Relative 0.6 0 - 3 %   Neutro Abs 4.4 1.4 - 7.7 K/uL   Lymphs Abs 3.6 0.7 - 4.0 K/uL   Monocytes Absolute 1.4 (H) 0.1 - 1.0 K/uL   Eosinophils Absolute 0.6 0.0 - 0.7 K/uL   Basophils Absolute 0.1 0.0 - 0.1 K/uL  Lipid panel     Status: None   Collection Time: 11/27/20  8:21 AM  Result Value Ref Range   Cholesterol 141 0 - 200 mg/dL    Comment: ATP III Classification       Desirable:  < 200 mg/dL               Borderline High:  200 - 239 mg/dL          High:  > = 240 mg/dL   Triglycerides 98.0 0 - 149 mg/dL    Comment: Normal:  <150 mg/dLBorderline High:  150 - 199 mg/dL   HDL 43.80 >39.00 mg/dL   VLDL 19.6 0.0 - 40.0 mg/dL   LDL Cholesterol 78 0 - 99 mg/dL   Total CHOL/HDL Ratio 3     Comment:                Men          Women1/2 Average Risk     3.4          3.3Average Risk          5.0          4.42X Average Risk          9.6          7.13X Average Risk          15.0          11.0                        NonHDL 97.38     Comment: NOTE:  Non-HDL goal should be 30 mg/dL higher than patient's LDL goal (i.e. LDL goal of < 70 mg/dL, would have non-HDL goal of < 100 mg/dL)  Comprehensive metabolic panel     Status: Abnormal   Collection Time: 11/27/20  8:21 AM  Result Value Ref Range   Sodium 141 135 - 145 mEq/L   Potassium 4.4 3.5 - 5.1 mEq/L   Chloride 104 96 - 112 mEq/L   CO2 30 19 - 32 mEq/L   Glucose, Bld 95 70 - 99 mg/dL   BUN 23 6 - 23 mg/dL   Creatinine, Ser 1.04 0.40 - 1.20 mg/dL   Total Bilirubin 0.7 0.2 - 1.2 mg/dL   Alkaline Phosphatase 43 39 - 117 U/L   AST 15 0 - 37 U/L   ALT 9 0 - 35 U/L   Total Protein 6.5 6.0 -  8.3 g/dL   Albumin 3.9 3.5 - 5.2 g/dL   GFR 47.83 (L) >60.00 mL/min    Comment: Calculated using the CKD-EPI Creatinine Equation (2021)   Calcium 9.9 8.4 - 10.5 mg/dL   Objective  Body mass index is 32.75 kg/m. Wt Readings from Last 3 Encounters:  11/29/20 190 lb 12.8 oz (86.5 kg)  11/25/20 199 lb (90.3 kg)  03/05/20 199 lb 3.2 oz (90.4 kg)   Temp Readings from Last 3 Encounters:  11/29/20 (!) 97.5 F (36.4 C) (Oral)  03/05/20 97.6 F (36.4 C) (Skin)  11/01/19 97.8 F (36.6 C) (Skin)   BP Readings from Last 3 Encounters:  11/29/20 124/66  03/05/20 134/84  11/01/19 126/70   Pulse Readings from Last 3 Encounters:  11/29/20 (!) 57  03/05/20 81  11/01/19 69    Physical Exam Vitals and nursing note reviewed.  Constitutional:      Appearance: Normal appearance. She is well-developed and well-groomed. She is obese.  HENT:     Head: Normocephalic and atraumatic.     Right Ear: There is impacted cerumen.     Left Ear: There is impacted cerumen.  Eyes:     Conjunctiva/sclera: Conjunctivae normal.     Pupils: Pupils are equal, round, and reactive to light.  Cardiovascular:     Rate and Rhythm: Normal rate and regular rhythm.     Heart sounds: Normal heart sounds. No murmur heard.   Pulmonary:     Effort: Pulmonary effort is  normal.     Breath sounds: Normal breath sounds.  Skin:    General: Skin is warm and dry.  Neurological:     General: No focal deficit present.     Mental Status: She is alert and oriented to person, place, and time. Mental status is at baseline.     Gait: Gait normal.  Psychiatric:        Attention and Perception: Attention and perception normal.        Mood and Affect: Mood and affect normal.        Speech: Speech normal.        Behavior: Behavior normal. Behavior is cooperative.        Thought Content: Thought content normal.        Cognition and Memory: Cognition and memory normal.        Judgment: Judgment normal.     Assessment  Plan  Hypertension controlled associated with diabetes ([place]) with CKD 3a at goal <7.0 - Plan: Microalbumin / creatinine urine ratio, Urinalysis,  amaryl 2 mg lis 20 mg qd toprol xl 100 mg qd mevacor 20 mg qd rec increase water intake   Lesion of nose - Plan: Ambulatory referral to Dermatology Skin cancer screening - Plan: Ambulatory referral to Dermatology  Colostomy hernia [facility]) - Plan: Ambulatory referral to General Surgery wants to see Dr. Hampton Abbot  Insomnia, unspecified type - Plan: traZODone (DESYREL) 50 MG tablet   Leg edema - Plan: furosemide (LASIX) 20 MG tablet  B/l cerumen impaction >hearing loss   Consented and tolerated b/l ear lavage with currette removal all wax removed   HM Had flu shotutd utd prevnar  pna 23utd  covid pfizer had 01/01/20 and 01/29/20  did have shingles shot in past but 11/29/20 Rx Tdap and shingrix   Tdap and shingrix considerwill disc in future again MMR immune  Never had colonoscopy cologuard + 4/1/17see chartdeclined colonoscopys/p rectal mass excisiondue again f/u 12/2019; left colostomy bag long termby Dr. Gerald Stabs white, colonoscopy  Dr. Marius Ditch No pap  mammo out of age window  dexa 01/21/14 normal  WBC 11.5 noted 05/31/18 labs   Reviewed CT ab/pelvis 02/11/18 b/l atrophic kidneys kidney right  side and b/l kidney scarring, 1.3 cm gallstone, diverticulosis, aortic calcifications, MV calcification.    Provider: Dr. Olivia Mackie McLean-Scocuzza-Internal Medicine

## 2020-11-29 NOTE — Addendum Note (Signed)
Addended by: Leeanne Rio on: 11/29/2020 10:25 AM   Modules accepted: Orders

## 2020-11-30 LAB — URINE CULTURE
MICRO NUMBER:: 11274504
SPECIMEN QUALITY:: ADEQUATE

## 2020-11-30 LAB — MICROALBUMIN / CREATININE URINE RATIO
Creatinine, Urine: 136 mg/dL (ref 20–275)
Microalb Creat Ratio: 7 ug/mg{creat}
Microalb, Ur: 1 mg/dL

## 2020-11-30 LAB — URINALYSIS, ROUTINE W REFLEX MICROSCOPIC
Bacteria, UA: NONE SEEN /HPF
Bilirubin Urine: NEGATIVE
Glucose, UA: NEGATIVE
Hyaline Cast: NONE SEEN /LPF
Ketones, ur: NEGATIVE
Nitrite: NEGATIVE
Protein, ur: NEGATIVE
Specific Gravity, Urine: 1.02 (ref 1.001–1.03)
pH: 5.5 (ref 5.0–8.0)

## 2020-12-02 ENCOUNTER — Ambulatory Visit: Payer: Medicare HMO

## 2020-12-11 ENCOUNTER — Ambulatory Visit: Payer: Medicare HMO | Admitting: Surgery

## 2021-01-03 DIAGNOSIS — Z933 Colostomy status: Secondary | ICD-10-CM | POA: Diagnosis not present

## 2021-01-03 DIAGNOSIS — K56609 Unspecified intestinal obstruction, unspecified as to partial versus complete obstruction: Secondary | ICD-10-CM | POA: Diagnosis not present

## 2021-01-08 ENCOUNTER — Encounter: Payer: Self-pay | Admitting: Surgery

## 2021-01-08 ENCOUNTER — Ambulatory Visit (INDEPENDENT_AMBULATORY_CARE_PROVIDER_SITE_OTHER): Payer: Medicare HMO | Admitting: Surgery

## 2021-01-08 ENCOUNTER — Other Ambulatory Visit: Payer: Self-pay

## 2021-01-08 VITALS — BP 169/83 | HR 66 | Temp 98.6°F | Ht 64.0 in | Wt 189.8 lb

## 2021-01-08 DIAGNOSIS — K435 Parastomal hernia without obstruction or  gangrene: Secondary | ICD-10-CM | POA: Diagnosis not present

## 2021-01-08 DIAGNOSIS — K635 Polyp of colon: Secondary | ICD-10-CM

## 2021-01-08 NOTE — Progress Notes (Signed)
01/08/2021  Reason for Visit:  Parastomal hernia, cecal polyp  Referring Provider:  Orland Mustard, MD  History of Present Illness: Heidi Whitaker is a 87 y.o. female presenting for evaluation of a parastomal hernia and cecal polyp.  The patient was found to have a 5 cm flat polyp in the cecum and a large rectal mass on colonoscopy on 01/09/2019 with Dr. Marius Ditch.  The rectal mass was biopsied and showed a tubulovillous adenoma.  She underwent a robotic assisted low anterior resection with Dr. Dema Severin on 05/17/2019.  The patient and her daughter report that there was miscommunication about the treatment plan and what they thought was the surgery going in, was a different surgery going out.  They report the initial plan was for treatment of both areas, and ended up being only the rectal mass with a permanent colostomy.  The cecal polyp was not addressed during surgery.  They saw Dr. Dema Severin last year but they do not want to see him again in the future and would like to start care with me instead.  They also report that they were never informed that she needed to have a follow up colonoscopy.  They need a  yearly prescription for her ostomy supplies.  The patient reports that she's had one episode of pain in the LLQ at the ostomy site, and knows that she has a parastomal hernia.  However, the pain only happened once and she denies any further issues.  She also denies any troubles with the ostomy or with the appliances.  Denies any blood in her stool or any other abdominal pain.  Past Medical History: Past Medical History:  Diagnosis Date  . Diabetes mellitus without complication ([place])   . DVT (deep venous thrombosis) ([place])    06/02/2019  . Hard of hearing   . Hypertension      Past Surgical History: Past Surgical History:  Procedure Laterality Date  . CESAREAN SECTION    . COLONOSCOPY N/A 05/17/2019   Procedure: FLEXIBLE SIGMOIDOSCOPY;  Surgeon: Ileana Roup, MD;  Location: [facility];   Service: General;  Laterality: N/A;  . COLONOSCOPY WITH PROPOFOL N/A 01/09/2019   Procedure: COLONOSCOPY WITH PROPOFOL;  Surgeon: Lin Landsman, MD;  Location: [facility] ENDOSCOPY;  Service: Gastroenterology;  Laterality: N/A;  . ESOPHAGOGASTRODUODENOSCOPY (EGD) WITH PROPOFOL N/A 01/09/2019   Procedure: ESOPHAGOGASTRODUODENOSCOPY (EGD) WITH PROPOFOL;  Surgeon: Lin Landsman, MD;  Location: [facility] ENDOSCOPY;  Service: Gastroenterology;  Laterality: N/A;  . KNEE SURGERY     left 2017/2018  . SHOULDER SURGERY     right  . XI ROBOTIC ASSISTED LOWER ANTERIOR RESECTION N/A 05/17/2019   Procedure: XI ROBOTIC ASSISTED LOWER ANTERIOR RESECTION WITH END COLOSTOMY;  Surgeon: Ileana Roup, MD;  Location: [facility];  Service: General;  Laterality: N/A;    Home Medications: Prior to Admission medications   Medication Sig Start Date End Date Taking? Authorizing Provider  aspirin 81 MG chewable tablet Chew 81 mg by mouth daily.   Yes [provider]  furosemide (LASIX) 20 MG tablet Take 1 tablet (20 mg total) by mouth daily as needed. 11/29/20  Yes McLean-Scocuzza, Nino Glow, MD  glimepiride (AMARYL) 2 MG tablet Take 1 tablet (2 mg total) by mouth daily with breakfast. 11/29/20  Yes McLean-Scocuzza, Nino Glow, MD  lisinopril (ZESTRIL) 20 MG tablet Take 1 tablet (20 mg total) by mouth daily. Do not cut in 1/2 dose increased dose to 20 mg 11/29/20  Yes McLean-Scocuzza, Nino Glow, MD  lovastatin (  MEVACOR) 20 MG tablet Take 1 tablet (20 mg total) by mouth daily at 6 PM. 11/29/20  Yes McLean-Scocuzza, Nino Glow, MD  metoprolol succinate (TOPROL-XL) 100 MG 24 hr tablet Take 1 tablet (100 mg total) by mouth daily. Take with or immediately following a meal. 11/29/20  Yes McLean-Scocuzza, Nino Glow, MD    Allergies: Allergies  Allergen Reactions  . Metformin And Related     Diarrhea     Social History:  reports that she has quit smoking. She quit after 4.00 years of use. She has never used smokeless  tobacco. She reports that she does not drink alcohol and does not use drugs.   Family History: Family History  Problem Relation Age of Onset  . Addison's disease Maternal Aunt   . Heart disease Mother   . Heart disease Father     Review of Systems: Review of Systems  Constitutional: Negative for chills and fever.  HENT: Negative for hearing loss.   Respiratory: Negative for shortness of breath.   Cardiovascular: Negative for chest pain.  Gastrointestinal: Positive for abdominal pain (only one episode of pain). Negative for blood in stool, constipation, diarrhea, nausea and vomiting.  Genitourinary: Negative for dysuria.  Musculoskeletal: Negative for myalgias.  Skin: Negative for rash.  Neurological: Negative for dizziness.  Psychiatric/Behavioral: Negative for depression.    Physical Exam BP (!) 169/83   Pulse 66   Temp 98.6 F (37 C) (Oral)   Ht 5\' 4"  (1.626 m)   Wt 189 lb 12.8 oz (86.1 kg)   SpO2 92%   BMI 32.58 kg/m  CONSTITUTIONAL: No acute distress HEENT:  Normocephalic, atraumatic, extraocular motion intact. NECK: Trachea is midline, and there is no jugular venous distension.  RESPIRATORY:  Normal respiratory effort without pathologic use of accessory muscles. CARDIOVASCULAR: Regular rhythm and rate. GI: The abdomen is soft, non-distended, non-tender to palpation.  Incisions from prior surgery all well healed.  The patient has a easily reducible parastomal hernia.  The stoma itself is healthy, flush with the skin, with stool in her bag.  Hemoccult card was negative for blood.  No tenderness or mass palpable in the RLQ.  MUSCULOSKELETAL:  Normal muscle strength and tone in all four extremities.  No peripheral edema or cyanosis. SKIN: Skin turgor is normal. There are no pathologic skin lesions.  NEUROLOGIC:  Motor and sensation is grossly normal.  Cranial nerves are grossly intact. PSYCH:  Alert and oriented to person, place and time. Affect is normal.  Laboratory  Analysis: No results found for this or any previous visit (from the past 24 hour(s)).  Imaging: No results found.  Assessment and Plan: This is a 87 y.o. female s/p robotic assisted LAR for rectal mass, with parastomal hernia and remaining cecal polyp.  --Discussed with the patient that we can certainly continue with her care in our office, and continue with her prescriptions for ostomy supplies.  Her daughter will contact the supply company so they can fax [place] the list of her supplies so we can send a prescription back to them. --It appears there was a lot of miscommunication regarding her surgery and follow up colonoscopy.  Discussed with them that there are two things that we would need to do depending on how aggressive she wants to be about her care at this point.  If she is interested in following up about her cecal polyp, then she would need a colonoscopy.  This would be the best way to evaluate if the polyp has grown,  if it would still be amenable to referral to an advanced endoscopist that could do EMR/ESD.  It may be that instead she needs a formal right colectomy instead.  With regards to the parastomal hernia, since this has not really caused her any problems, we do not necessarily need to do anything about it.  However, if she wanted to fix the parastomal hernia, the first thing to do would be to get a CT scan of her abdomen/pelvis to better evaluate her anatomy to see if she has other hernias that would need repair at the same time.  That way we have a better plan of action going into surgery. --The patient will think about her options and what her goals of care are.  She understands that to follow on the cecal polyp would mean having another colonoscopy and a bowel prep.  She will call us with her decision and we will plan accordingly.  Face-to-face time spent with the patient and care providers was 40 minutes, with more than 50% of the time spent counseling, educating, and coordinating  care of the patient.     Melvyn Neth, [facility]

## 2021-01-08 NOTE — Patient Instructions (Signed)
Please let us know if you decide to have the Colonoscopy and we can send the referral to [facility].  Our fax number is [phone]. Please call [place] and ask them to fax an order for your supplies to Dr.Jose Piscoya at [facility].

## 2021-01-31 DIAGNOSIS — Z933 Colostomy status: Secondary | ICD-10-CM | POA: Diagnosis not present

## 2021-01-31 DIAGNOSIS — K56609 Unspecified intestinal obstruction, unspecified as to partial versus complete obstruction: Secondary | ICD-10-CM | POA: Diagnosis not present

## 2021-02-28 DIAGNOSIS — Z933 Colostomy status: Secondary | ICD-10-CM | POA: Diagnosis not present

## 2021-02-28 DIAGNOSIS — K56609 Unspecified intestinal obstruction, unspecified as to partial versus complete obstruction: Secondary | ICD-10-CM | POA: Diagnosis not present

## 2021-03-25 ENCOUNTER — Telehealth: Payer: Self-pay

## 2021-03-25 NOTE — Telephone Encounter (Signed)
Faxed care consideration back to [employer] on 03/25/21 to [phone]. Consider adding ACE inhibitor at next appt

## 2021-04-02 DIAGNOSIS — K56609 Unspecified intestinal obstruction, unspecified as to partial versus complete obstruction: Secondary | ICD-10-CM | POA: Diagnosis not present

## 2021-04-02 DIAGNOSIS — Z933 Colostomy status: Secondary | ICD-10-CM | POA: Diagnosis not present

## 2021-04-17 ENCOUNTER — Other Ambulatory Visit: Payer: Self-pay

## 2021-04-17 ENCOUNTER — Ambulatory Visit: Payer: Medicare HMO | Admitting: Dermatology

## 2021-04-17 DIAGNOSIS — L570 Actinic keratosis: Secondary | ICD-10-CM

## 2021-04-17 DIAGNOSIS — L578 Other skin changes due to chronic exposure to nonionizing radiation: Secondary | ICD-10-CM

## 2021-04-17 DIAGNOSIS — L821 Other seborrheic keratosis: Secondary | ICD-10-CM | POA: Diagnosis not present

## 2021-04-17 NOTE — Progress Notes (Signed)
   New Patient Visit  Subjective  Heidi Whitaker is a 87 y.o. female who presents for the following: Other (Sore on nose x at least 3 years).  She has other areas to be checked today.  Accompanied by daughter  The following portions of the chart were reviewed this encounter and updated as appropriate:   Tobacco  Allergies  Meds  Problems  Med Hx  Surg Hx  Fam Hx     Review of Systems:  No other skin or systemic complaints except as noted in HPI or Assessment and Plan.  Objective  Well appearing patient in no apparent distress; mood and affect are within normal limits.  A focused examination was performed including face. Relevant physical exam findings are noted in the Assessment and Plan.  Objective  Mid Tip of Nose: Hyperkeratotic papule   Assessment & Plan    Actinic Damage - chronic, secondary to cumulative UV radiation exposure/sun exposure over time - diffuse scaly erythematous macules with underlying dyspigmentation - Recommend daily broad spectrum sunscreen SPF 30+ to sun-exposed areas, reapply every 2 hours as needed.  - Recommend staying in the shade or wearing long sleeves, sun glasses (UVA+UVB protection) and wide brim hats (4-inch brim around the entire circumference of the hat). - Call for new or changing lesions.  Seborrheic Keratoses - Stuck-on, waxy, tan-brown papules and/or plaques  - Benign-appearing - Discussed benign etiology and prognosis. - Observe - Call for any changes   AK (actinic keratosis) Mid Tip of Nose  Hypertrophic AK  Destruction of lesion - Mid Tip of Nose Complexity: simple   Destruction method: cryotherapy   Informed consent: discussed and consent obtained   Timeout:  patient name, date of birth, surgical site, and procedure verified Lesion destroyed using liquid nitrogen: Yes   Region frozen until ice ball extended beyond lesion: Yes   Outcome: patient tolerated procedure well with no complications   Post-procedure  details: wound care instructions given    Return in about 6 weeks (around 05/29/2021) for AK follow up.  I, Ashok Cordia, CMA, am acting as scribe for Sarina Ser, MD .  Documentation: I have reviewed the above documentation for accuracy and completeness, and I agree with the above.  Sarina Ser, MD

## 2021-04-17 NOTE — Patient Instructions (Signed)

## 2021-04-22 ENCOUNTER — Encounter: Payer: Self-pay | Admitting: Dermatology

## 2021-05-01 DIAGNOSIS — K56609 Unspecified intestinal obstruction, unspecified as to partial versus complete obstruction: Secondary | ICD-10-CM | POA: Diagnosis not present

## 2021-05-01 DIAGNOSIS — Z933 Colostomy status: Secondary | ICD-10-CM | POA: Diagnosis not present

## 2021-05-14 ENCOUNTER — Emergency Department: Payer: Medicare HMO

## 2021-05-14 ENCOUNTER — Inpatient Hospital Stay
Admission: EM | Admit: 2021-05-14 | Discharge: 2021-05-16 | DRG: 291 | Disposition: A | Discharge status: Home / Self Care | Payer: Medicare HMO | Attending: Internal Medicine | Admitting: Internal Medicine

## 2021-05-14 ENCOUNTER — Other Ambulatory Visit: Payer: Self-pay

## 2021-05-14 DIAGNOSIS — E785 Hyperlipidemia, unspecified: Secondary | ICD-10-CM | POA: Diagnosis present

## 2021-05-14 DIAGNOSIS — I509 Heart failure, unspecified: Secondary | ICD-10-CM | POA: Diagnosis not present

## 2021-05-14 DIAGNOSIS — I4891 Unspecified atrial fibrillation: Secondary | ICD-10-CM | POA: Diagnosis not present

## 2021-05-14 DIAGNOSIS — R531 Weakness: Secondary | ICD-10-CM

## 2021-05-14 DIAGNOSIS — J9601 Acute respiratory failure with hypoxia: Secondary | ICD-10-CM | POA: Diagnosis present

## 2021-05-14 DIAGNOSIS — E876 Hypokalemia: Secondary | ICD-10-CM | POA: Diagnosis not present

## 2021-05-14 DIAGNOSIS — E1122 Type 2 diabetes mellitus with diabetic chronic kidney disease: Secondary | ICD-10-CM | POA: Diagnosis present

## 2021-05-14 DIAGNOSIS — I130 Hypertensive heart and chronic kidney disease with heart failure and stage 1 through stage 4 chronic kidney disease, or unspecified chronic kidney disease: Secondary | ICD-10-CM | POA: Diagnosis not present

## 2021-05-14 DIAGNOSIS — D72829 Elevated white blood cell count, unspecified: Secondary | ICD-10-CM | POA: Diagnosis not present

## 2021-05-14 DIAGNOSIS — Z8249 Family history of ischemic heart disease and other diseases of the circulatory system: Secondary | ICD-10-CM

## 2021-05-14 DIAGNOSIS — I5021 Acute systolic (congestive) heart failure: Secondary | ICD-10-CM | POA: Diagnosis present

## 2021-05-14 DIAGNOSIS — R0600 Dyspnea, unspecified: Secondary | ICD-10-CM | POA: Diagnosis present

## 2021-05-14 DIAGNOSIS — E1151 Type 2 diabetes mellitus with diabetic peripheral angiopathy without gangrene: Secondary | ICD-10-CM | POA: Diagnosis present

## 2021-05-14 DIAGNOSIS — I739 Peripheral vascular disease, unspecified: Secondary | ICD-10-CM | POA: Diagnosis not present

## 2021-05-14 DIAGNOSIS — Z7984 Long term (current) use of oral hypoglycemic drugs: Secondary | ICD-10-CM

## 2021-05-14 DIAGNOSIS — I700 Atherosclerosis of aorta: Secondary | ICD-10-CM | POA: Diagnosis present

## 2021-05-14 DIAGNOSIS — M7989 Other specified soft tissue disorders: Secondary | ICD-10-CM | POA: Diagnosis not present

## 2021-05-14 DIAGNOSIS — Z86718 Personal history of other venous thrombosis and embolism: Secondary | ICD-10-CM

## 2021-05-14 DIAGNOSIS — Z888 Allergy status to other drugs, medicaments and biological substances status: Secondary | ICD-10-CM

## 2021-05-14 DIAGNOSIS — R778 Other specified abnormalities of plasma proteins: Secondary | ICD-10-CM

## 2021-05-14 DIAGNOSIS — F0150 Vascular dementia without behavioral disturbance: Secondary | ICD-10-CM | POA: Diagnosis present

## 2021-05-14 DIAGNOSIS — Z20822 Contact with and (suspected) exposure to covid-19: Secondary | ICD-10-CM | POA: Diagnosis present

## 2021-05-14 DIAGNOSIS — Z66 Do not resuscitate: Secondary | ICD-10-CM | POA: Diagnosis present

## 2021-05-14 DIAGNOSIS — H9190 Unspecified hearing loss, unspecified ear: Secondary | ICD-10-CM | POA: Diagnosis present

## 2021-05-14 DIAGNOSIS — J9811 Atelectasis: Secondary | ICD-10-CM | POA: Diagnosis not present

## 2021-05-14 DIAGNOSIS — R7989 Other specified abnormal findings of blood chemistry: Secondary | ICD-10-CM | POA: Diagnosis not present

## 2021-05-14 DIAGNOSIS — Z933 Colostomy status: Secondary | ICD-10-CM

## 2021-05-14 DIAGNOSIS — R200 Anesthesia of skin: Secondary | ICD-10-CM | POA: Diagnosis present

## 2021-05-14 DIAGNOSIS — N1831 Chronic kidney disease, stage 3a: Secondary | ICD-10-CM | POA: Diagnosis present

## 2021-05-14 DIAGNOSIS — N951 Menopausal and female climacteric states: Secondary | ICD-10-CM | POA: Diagnosis present

## 2021-05-14 DIAGNOSIS — Z7982 Long term (current) use of aspirin: Secondary | ICD-10-CM

## 2021-05-14 DIAGNOSIS — Z79899 Other long term (current) drug therapy: Secondary | ICD-10-CM

## 2021-05-14 DIAGNOSIS — J90 Pleural effusion, not elsewhere classified: Secondary | ICD-10-CM | POA: Diagnosis not present

## 2021-05-14 DIAGNOSIS — G4700 Insomnia, unspecified: Secondary | ICD-10-CM | POA: Diagnosis present

## 2021-05-14 DIAGNOSIS — R0602 Shortness of breath: Secondary | ICD-10-CM | POA: Diagnosis not present

## 2021-05-14 DIAGNOSIS — R609 Edema, unspecified: Secondary | ICD-10-CM

## 2021-05-14 DIAGNOSIS — Z87891 Personal history of nicotine dependence: Secondary | ICD-10-CM

## 2021-05-14 LAB — BRAIN NATRIURETIC PEPTIDE: B Natriuretic Peptide: 191.8 pg/mL — ABNORMAL HIGH (ref 0.0–100.0)

## 2021-05-14 LAB — CBG MONITORING, ED: Glucose-Capillary: 114 mg/dL — ABNORMAL HIGH (ref 70–99)

## 2021-05-14 LAB — TROPONIN I (HIGH SENSITIVITY)
Troponin I (High Sensitivity): 20 ng/L — ABNORMAL HIGH (ref <18)
Troponin I (High Sensitivity): 22 ng/L — ABNORMAL HIGH (ref <18)

## 2021-05-14 LAB — URINALYSIS, COMPLETE (UACMP) WITH MICROSCOPIC
Bacteria, UA: NONE SEEN
Bilirubin Urine: NEGATIVE
Glucose, UA: NEGATIVE mg/dL
Hgb urine dipstick: NEGATIVE
Ketones, ur: NEGATIVE mg/dL
Nitrite: NEGATIVE
Protein, ur: NEGATIVE mg/dL
Specific Gravity, Urine: 1.004 — ABNORMAL LOW (ref 1.005–1.030)
pH: 7 (ref 5.0–8.0)

## 2021-05-14 LAB — CBC
HCT: 37.4 % (ref 36.0–46.0)
Hemoglobin: 12.2 g/dL (ref 12.0–15.0)
MCH: 29.8 pg (ref 26.0–34.0)
MCHC: 32.6 g/dL (ref 30.0–36.0)
MCV: 91.2 fL (ref 80.0–100.0)
Platelets: 231 10*3/uL (ref 150–400)
RBC: 4.1 MIL/uL (ref 3.87–5.11)
RDW: 14.4 % (ref 11.5–15.5)
WBC: 9.2 10*3/uL (ref 4.0–10.5)
nRBC: 0 % (ref 0.0–0.2)

## 2021-05-14 LAB — BASIC METABOLIC PANEL
Anion gap: 9 (ref 5–15)
BUN: 27 mg/dL — ABNORMAL HIGH (ref 8–23)
CO2: 23 mmol/L (ref 22–32)
Calcium: 9.3 mg/dL (ref 8.9–10.3)
Chloride: 104 mmol/L (ref 98–111)
Creatinine, Ser: 0.99 mg/dL (ref 0.44–1.00)
GFR, Estimated: 55 mL/min — ABNORMAL LOW (ref >60)
Glucose, Bld: 114 mg/dL — ABNORMAL HIGH (ref 70–99)
Potassium: 4.6 mmol/L (ref 3.5–5.1)
Sodium: 136 mmol/L (ref 135–145)

## 2021-05-14 LAB — RESP PANEL BY RT-PCR (FLU A&B, COVID) ARPGX2
Influenza A by PCR: NEGATIVE
Influenza B by PCR: NEGATIVE
SARS Coronavirus 2 by RT PCR: NEGATIVE

## 2021-05-14 LAB — D-DIMER, QUANTITATIVE: D-Dimer, Quant: 3.26 ug/mL-FEU — ABNORMAL HIGH (ref 0.00–0.50)

## 2021-05-14 LAB — MAGNESIUM: Magnesium: 2.3 mg/dL (ref 1.7–2.4)

## 2021-05-14 LAB — CK: Total CK: 61 U/L (ref 38–234)

## 2021-05-14 MED ORDER — HYDRALAZINE HCL 50 MG PO TABS: 25.0000 mg | ORAL_TABLET | Freq: Four times a day (QID) | ORAL | Status: DC | PRN

## 2021-05-14 MED ORDER — ACETAMINOPHEN 650 MG RE SUPP: 650.0000 mg | Freq: Four times a day (QID) | RECTAL | Status: DC | PRN

## 2021-05-14 MED ORDER — PRAVASTATIN SODIUM 20 MG PO TABS
20.0000 mg | ORAL_TABLET | Freq: Every day | ORAL | Status: DC
  Administered 2021-05-15: 18:00:00 20 mg via ORAL
  Filled 2021-05-14 (×2): qty 1

## 2021-05-14 MED ORDER — INSULIN ASPART 100 UNIT/ML IJ SOLN
0.0000 [IU] | Freq: Three times a day (TID) | INTRAMUSCULAR | Status: DC
  Administered 2021-05-15: 1 [IU] via SUBCUTANEOUS
  Filled 2021-05-14: qty 1

## 2021-05-14 MED ORDER — IOHEXOL 350 MG/ML SOLN
100.0000 mL | Freq: Once | INTRAVENOUS | Status: AC | PRN
  Administered 2021-05-14: 100 mL via INTRAVENOUS

## 2021-05-14 MED ORDER — ACETAMINOPHEN 325 MG PO TABS
650.0000 mg | ORAL_TABLET | Freq: Four times a day (QID) | ORAL | Status: DC | PRN
Start: 2021-05-14 — End: 2021-05-16
  Administered 2021-05-15: 650 mg via ORAL
  Filled 2021-05-14 (×2): qty 2

## 2021-05-14 MED ORDER — LISINOPRIL 20 MG PO TABS
20.0000 mg | ORAL_TABLET | Freq: Every day | ORAL | Status: DC
  Administered 2021-05-14: 20 mg via ORAL
  Filled 2021-05-14: qty 2
  Filled 2021-05-14: qty 1

## 2021-05-14 MED ORDER — FUROSEMIDE 10 MG/ML IJ SOLN
40.0000 mg | Freq: Once | INTRAMUSCULAR | Status: AC
  Administered 2021-05-14: 40 mg via INTRAVENOUS
  Filled 2021-05-14: qty 4

## 2021-05-14 MED ORDER — ALBUTEROL SULFATE (2.5 MG/3ML) 0.083% IN NEBU: 2.5000 mg | INHALATION_SOLUTION | RESPIRATORY_TRACT | Status: DC | PRN

## 2021-05-14 MED ORDER — ASPIRIN 81 MG PO CHEW
81.0000 mg | CHEWABLE_TABLET | Freq: Every day | ORAL | Status: DC
  Administered 2021-05-15 – 2021-05-16 (×2): 81 mg via ORAL
  Filled 2021-05-14 (×2): qty 1

## 2021-05-14 MED ORDER — METOPROLOL SUCCINATE ER 50 MG PO TB24
100.0000 mg | ORAL_TABLET | Freq: Every day | ORAL | Status: DC
  Filled 2021-05-14: qty 2

## 2021-05-14 MED ORDER — ENOXAPARIN SODIUM 40 MG/0.4ML IJ SOSY
40.0000 mg | PREFILLED_SYRINGE | INTRAMUSCULAR | Status: DC
  Administered 2021-05-15: 40 mg via SUBCUTANEOUS
  Filled 2021-05-14 (×2): qty 0.4

## 2021-05-14 MED ORDER — FUROSEMIDE 10 MG/ML IJ SOLN
20.0000 mg | Freq: Every day | INTRAMUSCULAR | Status: DC
  Administered 2021-05-15: 20 mg via INTRAVENOUS
  Filled 2021-05-14: qty 2
  Filled 2021-05-14: qty 4

## 2021-05-14 NOTE — ED Triage Notes (Signed)
Pt comes with c/o weakness that just started today. Pt states sudden onset of weakness in her legs. Family reports pt's legs were cold to touch.  Pt states pain in both feet. Pt is warm to touch. Pt states some SOB, pt denies any CP or dizziness.

## 2021-05-14 NOTE — H&P (Signed)
.   Chief Complaint: Patient presented with daughter on account of generalized weakness  HPI:  Heidi Whitaker is a  87 y.o. pleasant female with medical history significant for diabetes mellitus, hypertension, status post diverting colostomy in 2018 and DVT.  Patient initially presented with concerns for some soreness and numbness in both feet for few days duration.  She also complains of having some hot flashes and being out of her baseline.  Daughter was concerned decided to bring patient to the emergency room to be evaluated.  At time of evaluation, patient denied any dysuria or increased frequency micturition.  Denies any fever or chills.  Denies nausea or vomiting..  Denied any headache.  She also denied any shortness of breath but admits to easy fatigability which she attributes to her age.  She denies chest pain or palpitations.  Patient was being planned for possible discharge however upon ambulation, patient desaturated.  This was different from her baseline.  In the ED, patient was extensively worked up, D-dimer came back elevated.  CT angiogram was subsequently done ruled out PE.  CT scan did show some evidence of atelectasis.  At baseline, patient is on as needed Lasix.  BNP was mildly elevated. Patient was referred to hospitalist service for admission due to concern for possible CHF.  Patient has no prior known prior history of CHF.  She has no echocardiogram on file.   Treatment given in the ED: IV Lasix 40 mg COVID-19 test was negative  Past Medical History:  Diagnosis Date  . Diabetes mellitus without complication ([place])   . DVT (deep venous thrombosis) ([place])    06/02/2019  . Hard of hearing   . Hypertension     Past Surgical History:  Procedure Laterality Date  . CESAREAN SECTION    . COLONOSCOPY N/A 05/17/2019   Procedure: FLEXIBLE SIGMOIDOSCOPY;  Surgeon: Ileana Roup, MD;  Location: [facility] ORS;  Service: General;  Laterality: N/A;  . COLONOSCOPY WITH PROPOFOL N/A  01/09/2019   Procedure: COLONOSCOPY WITH PROPOFOL;  Surgeon: Lin Landsman, MD;  Location: [facility] ENDOSCOPY;  Service: Gastroenterology;  Laterality: N/A;  . ESOPHAGOGASTRODUODENOSCOPY (EGD) WITH PROPOFOL N/A 01/09/2019   Procedure: ESOPHAGOGASTRODUODENOSCOPY (EGD) WITH PROPOFOL;  Surgeon: Lin Landsman, MD;  Location: [facility] ENDOSCOPY;  Service: Gastroenterology;  Laterality: N/A;  . KNEE SURGERY     left 2017/2018  . SHOULDER SURGERY     right  . XI ROBOTIC ASSISTED LOWER ANTERIOR RESECTION N/A 05/17/2019   Procedure: XI ROBOTIC ASSISTED LOWER ANTERIOR RESECTION WITH END COLOSTOMY;  Surgeon: Ileana Roup, MD;  Location: [facility];  Service: General;  Laterality: N/A;    Family History  Problem Relation Age of Onset  . Addison's disease Maternal Aunt   . Heart disease Mother   . Heart disease Father    Social History:  reports that she has quit smoking. She quit after 4.00 years of use. She has never used smokeless tobacco. She reports that she does not drink alcohol and does not use drugs.  Allergies:  Allergies  Allergen Reactions  . Metformin And Related     Diarrhea     (Not in a hospital admission)   Results for orders placed or performed during the hospital encounter of 05/14/21 (from the past 48 hour(s))  Basic metabolic panel     Status: Abnormal   Collection Time: 05/14/21  3:31 PM  Result Value Ref Range   Sodium 136 135 - 145 mmol/L   Potassium 4.6 3.5 -  5.1 mmol/L   Chloride 104 98 - 111 mmol/L   CO2 23 22 - 32 mmol/L   Glucose, Bld 114 (H) 70 - 99 mg/dL    Comment: Glucose reference range applies only to samples taken after fasting for at least 8 hours.   BUN 27 (H) 8 - 23 mg/dL   Creatinine, Ser 0.99 0.44 - 1.00 mg/dL   Calcium 9.3 8.9 - 10.3 mg/dL   GFR, Estimated 55 (L) >60 mL/min    Comment: (NOTE) Calculated using the CKD-EPI Creatinine Equation (2021)    Anion gap 9 5 - 15    Comment: Performed at [facility], [address] [place], [place] [address]  CBC     Status: None   Collection Time: 05/14/21  3:31 PM  Result Value Ref Range   WBC 9.2 4.0 - 10.5 K/uL   RBC 4.10 3.87 - 5.11 MIL/uL   Hemoglobin 12.2 12.0 - 15.0 g/dL   HCT 37.4 36.0 - 46.0 %   MCV 91.2 80.0 - 100.0 fL   MCH 29.8 26.0 - 34.0 pg   MCHC 32.6 30.0 - 36.0 g/dL   RDW 14.4 11.5 - 15.5 %   Platelets 231 150 - 400 K/uL   nRBC 0.0 0.0 - 0.2 %    Comment: Performed at [facility], [place], [place] [address]  Troponin I (High Sensitivity)     Status: Abnormal   Collection Time: 05/14/21  3:31 PM  Result Value Ref Range   Troponin I (High Sensitivity) 20 (H) <18 ng/L    Comment: (NOTE) Elevated high sensitivity troponin I (hsTnI) values and significant  changes across serial measurements may suggest ACS but many other  chronic and acute conditions are known to elevate hsTnI results.  Refer to the "Links" section for chest pain algorithms and additional  guidance. Performed at [facility], [address], [place], [place] [address]   Magnesium     Status: None   Collection Time: 05/14/21  3:31 PM  Result Value Ref Range   Magnesium 2.3 1.7 - 2.4 mg/dL    Comment: Performed at [facility], [address] [place], [place] [address]  CK     Status: None   Collection Time: 05/14/21  3:31 PM  Result Value Ref Range   Total CK 61 38 - 234 U/L    Comment: Performed at [facility], [address] [place], [place] [address]  Brain natriuretic peptide     Status: Abnormal   Collection Time: 05/14/21  3:31 PM  Result Value Ref Range   B Natriuretic Peptide 191.8 (H) 0.0 - 100.0 pg/mL    Comment: Performed at [facility], [address], [place], [place] [address]  Resp Panel by RT-PCR (Flu A&B, Covid) Nasopharyngeal Swab     Status: None   Collection Time: 05/14/21  4:33 PM   Specimen: Nasopharyngeal Swab; Nasopharyngeal(NP) swabs in vial transport medium  Result Value Ref  Range   SARS Coronavirus 2 by RT PCR NEGATIVE NEGATIVE    Comment: (NOTE) SARS-CoV-2 target nucleic acids are NOT DETECTED.  The SARS-CoV-2 RNA is generally detectable in upper respiratory specimens during the acute phase of infection. The lowest concentration of SARS-CoV-2 viral copies this assay can detect is 138 copies/mL. A negative result does not preclude SARS-Cov-2 infection and should not be used as the sole basis for treatment or other patient management decisions. A negative result may occur with  improper  specimen collection/handling, submission of specimen other than nasopharyngeal swab, presence of viral mutation(s) within the areas targeted by this assay, and inadequate number of viral copies(<138 copies/mL). A negative result must be combined with clinical observations, patient history, and epidemiological information. The expected result is Negative.  Fact Sheet for Patients:  EntrepreneurPulse.com.au  Fact Sheet for Healthcare Providers:  IncredibleEmployment.be  This test is no t yet approved or cleared by the [place] FDA and  has been authorized for detection and/or diagnosis of SARS-CoV-2 by FDA under an Emergency Use Authorization (EUA). This EUA will remain  in effect (meaning this test can be used) for the duration of the COVID-19 declaration under Section 564(b)(1) of the Act, 21 U.S.C.section 360bbb-3(b)(1), unless the authorization is terminated  or revoked sooner.       Influenza A by PCR NEGATIVE NEGATIVE   Influenza B by PCR NEGATIVE NEGATIVE    Comment: (NOTE) The Xpert Xpress SARS-CoV-2/FLU/RSV plus assay is intended as an aid in the diagnosis of influenza from Nasopharyngeal swab specimens and should not be used as a sole basis for treatment. Nasal washings and aspirates are unacceptable for Xpert Xpress SARS-CoV-2/FLU/RSV testing.  Fact Sheet for  Patients: EntrepreneurPulse.com.au  Fact Sheet for Healthcare Providers: IncredibleEmployment.be  This test is not yet approved or cleared by the [place] FDA and has been authorized for detection and/or diagnosis of SARS-CoV-2 by FDA under an Emergency Use Authorization (EUA). This EUA will remain in effect (meaning this test can be used) for the duration of the COVID-19 declaration under Section 564(b)(1) of the Act, 21 U.S.C. section 360bbb-3(b)(1), unless the authorization is terminated or revoked.  Performed at [facility], [address] [place], [place] [address]   Urinalysis, Complete w Microscopic     Status: Abnormal   Collection Time: 05/14/21  6:06 PM  Result Value Ref Range   Color, Urine STRAW (A) YELLOW   APPearance CLEAR (A) CLEAR   Specific Gravity, Urine 1.004 (L) 1.005 - 1.030   pH 7.0 5.0 - 8.0   Glucose, UA NEGATIVE NEGATIVE mg/dL   Hgb urine dipstick NEGATIVE NEGATIVE   Bilirubin Urine NEGATIVE NEGATIVE   Ketones, ur NEGATIVE NEGATIVE mg/dL   Protein, ur NEGATIVE NEGATIVE mg/dL   Nitrite NEGATIVE NEGATIVE   Leukocytes,Ua SMALL (A) NEGATIVE   RBC / HPF 0-5 0 - 5 RBC/hpf   WBC, UA 0-5 0 - 5 WBC/hpf   Bacteria, UA NONE SEEN NONE SEEN   Squamous Epithelial / LPF 0-5 0 - 5    Comment: Performed at [facility], [place], [place] [address]  Troponin I (High Sensitivity)     Status: Abnormal   Collection Time: 05/14/21  6:29 PM  Result Value Ref Range   Troponin I (High Sensitivity) 22 (H) <18 ng/L    Comment: (NOTE) Elevated high sensitivity troponin I (hsTnI) values and significant  changes across serial measurements may suggest ACS but many other  chronic and acute conditions are known to elevate hsTnI results.  Refer to the "Links" section for chest pain algorithms and additional  guidance. Performed at [facility], [address] [place], [place] [address]    D-dimer, quantitative     Status: Abnormal   Collection Time: 05/14/21  6:47 PM  Result Value Ref Range   D-Dimer, Quant 3.26 (H) 0.00 - 0.50 ug/mL-FEU    Comment: (NOTE) At the manufacturer cut-off value of 0.5 g/mL FEU, this assay has a negative predictive value of  95-100%.This assay is intended for use in conjunction with a clinical pretest probability (PTP) assessment model to exclude pulmonary embolism (PE) and deep venous thrombosis (DVT) in outpatients suspected of PE or DVT. Results should be correlated with clinical presentation. Performed at [facility], [address] [place], [place] [address]    DG Chest 1 View  Result Date: 05/14/2021 CLINICAL DATA:  Shortness of breath for 1 day EXAM: CHEST  1 VIEW COMPARISON:  None. FINDINGS: Cardiac shadow is enlarged. Mild vascular congestion is noted with interstitial edema. Aortic calcifications are noted. No sizable effusion is seen. Postsurgical changes in the right shoulder are noted. IMPRESSION: Changes of mild CHF. Electronically Signed   By: Inez Catalina M.D.   On: 05/14/2021 16:21   CT Angio Chest PE W and/or Wo Contrast  Result Date: 05/14/2021 CLINICAL DATA:  Weakness. EXAM: CT ANGIOGRAPHY CHEST WITH CONTRAST TECHNIQUE: Multidetector CT imaging of the chest was performed using the standard protocol during bolus administration of intravenous contrast. Multiplanar CT image reconstructions and MIPs were obtained to evaluate the vascular anatomy. CONTRAST:  188mL OMNIPAQUE IOHEXOL 350 MG/ML SOLN COMPARISON:  None. FINDINGS: Cardiovascular: Moderate severity calcification of the aortic arch is seen. Satisfactory opacification of the pulmonary arteries to the segmental level. No evidence of pulmonary embolism. Normal heart size with marked severity coronary artery calcification. No pericardial effusion. Mediastinum/Nodes: No enlarged mediastinal, hilar, or axillary lymph nodes. Thyroid gland, trachea, and esophagus  demonstrate no significant findings. Lungs/Pleura: Mild diffuse atelectatic changes are seen throughout both lungs. A mild, patchy mosaic pattern is also seen bilaterally. There is a small right pleural effusion. No pneumothorax identified. Upper Abdomen: No acute abnormality. Musculoskeletal: A right shoulder replacement is noted. Degenerative changes are seen throughout the thoracic spine. Review of the MIP images confirms the above findings. IMPRESSION: 1. No evidence of pulmonary embolism. 2. Mild patchy mosaic pattern with mild bilateral atelectasis. 3. Small right effusion. 4. Right shoulder replacement. Electronically Signed   By: Virgina Norfolk M.D.   On: 05/14/2021 20:29   US Venous Img Lower Bilateral  Result Date: 05/14/2021 CLINICAL DATA:  Bilateral lower extremity swelling. EXAM: BILATERAL LOWER EXTREMITY VENOUS DOPPLER ULTRASOUND TECHNIQUE: Gray-scale sonography with graded compression, as well as color Doppler and duplex ultrasound were performed to evaluate the lower extremity deep venous systems from the level of the common femoral vein and including the common femoral, femoral, profunda femoral, popliteal and calf veins including the posterior tibial, peroneal and gastrocnemius veins when visible. The superficial great saphenous vein was also interrogated. Spectral Doppler was utilized to evaluate flow at rest and with distal augmentation maneuvers in the common femoral, femoral and popliteal veins. COMPARISON:  08/14/2019, 06/02/2019 FINDINGS: RIGHT LOWER EXTREMITY Common Femoral Vein: No evidence of thrombus. Normal compressibility, respiratory phasicity and response to augmentation. Saphenofemoral Junction: No evidence of thrombus. Normal compressibility and flow on color Doppler imaging. Profunda Femoral Vein: No evidence of thrombus. Normal compressibility and flow on color Doppler imaging. Femoral Vein: No evidence of thrombus. Normal compressibility, respiratory phasicity and response  to augmentation. Popliteal Vein: No evidence of thrombus. Normal compressibility, respiratory phasicity and response to augmentation. Calf Veins: No evidence of thrombus. Normal compressibility and flow on color Doppler imaging. Superficial Great Saphenous Vein: No evidence of thrombus. Normal compressibility. Venous Reflux:  None. Other Findings:  None. LEFT LOWER EXTREMITY Common Femoral Vein: No evidence of thrombus. Normal compressibility, respiratory phasicity and response to augmentation. Saphenofemoral Junction: No evidence of thrombus. Normal compressibility and flow on  color Doppler imaging. Profunda Femoral Vein: No evidence of thrombus. Normal compressibility and flow on color Doppler imaging. Femoral Vein: No evidence of thrombus. Normal compressibility, respiratory phasicity and response to augmentation. Popliteal Vein: No evidence of thrombus. Normal compressibility, respiratory phasicity and response to augmentation. Calf Veins: No evidence of thrombus. Normal compressibility and flow on color Doppler imaging. Superficial Great Saphenous Vein: No evidence of thrombus. Normal compressibility. Venous Reflux:  None. Other Findings:  None. IMPRESSION: No evidence of deep venous thrombosis in either lower extremity. Electronically Signed   By: Inez Catalina M.D.   On: 05/14/2021 18:24    Review of Systems  Musculoskeletal: Positive for arthralgias.  Neurological: Positive for weakness and numbness.  All other systems reviewed and are negative.   Blood pressure (!) 143/65, pulse 65, temperature 97.9 F (36.6 C), resp. rate 16, weight 81.6 kg, SpO2 97 %. Physical Exam Constitutional:      Appearance: Normal appearance.  HENT:     Head: Normocephalic and atraumatic.     Mouth/Throat:     Mouth: Mucous membranes are moist.  Cardiovascular:     Rate and Rhythm: Normal rate and regular rhythm.     Pulses: Normal pulses.  Pulmonary:     Effort: Pulmonary effort is normal.  Abdominal:      General: Abdomen is flat.     Comments: Diverting colostomy site looks clean and dry  Musculoskeletal:     Cervical back: Normal range of motion and neck supple.  Skin:    General: Skin is warm and dry.  Neurological:     General: No focal deficit present.     Mental Status: She is alert and oriented to person, place, and time. Mental status is at baseline.      Assessment/Plan  87 year old female with diabetes, hypertension and history of DVT.  Admitted on account of suspected CHF.  Further work-up with echocardiogram pending.  Patient was given IV Lasix in ED.  Acute dyspnea with hypoxia: Etiology is unclear.  CT angiogram ruled out PE.  Patient negative for COVID.  Mildly elevated BNP and questionable atelectasis and small right pleural effusion.  Possible CHF. Patient was offered IV Lasix 40 mg x 1 in the ED.  Will monitor overnight for symptomatic improvement.  Plan to resume on p.o. Lasix 20 mg.  Echocardiogram scheduled for a.m to evaluate left ventricular ejection fraction.  6-minute walk test in a.m., if stable patient may be discharged.  Small right-sided pleural effusion: No indication for thoracentesis.  History of DVT: Duplex ultrasound was negative for DVT  Lower extremity pain and numbness: Unlikely to be neurologic in etiology.  Symptoms have since resolved.  No indication for CT.  Tylenol for pain control.  History of brief A. fib/RVR intraoperatively in 2020.  Will monitor on telemetry overnight for any arrhythmias.  EKG shows sinus rhythm at this time.  Diabetes mellitus: On glimepiride: Fingerstick glucose monitoring as per protocol.  Insulin sliding scale at this time.  We will hold oral hypoglycemics.  [place], MD 05/14/2021, 9:54 PM

## 2021-05-14 NOTE — ED Notes (Signed)
This tech assisted pt to the toilet. Pt was notified to pull "assist for help' cord. Family at bedside. Pt ambulated back to bed independently. Pt appeared sob on exertion . Pt denies sob.  Pt was placed on the Sats monitor, 87% o2 RA. This tech made Janett Billow RN aware. Pt placed on 2L of o2.

## 2021-05-14 NOTE — ED Provider Notes (Addendum)
[facility] Emergency Department Provider Note  ____________________________________________   Event Date/Time   First MD Initiated Contact with Patient 05/14/21 1620     (approximate)  I have reviewed the triage vital signs and the nursing notes.   HISTORY  Chief Complaint Weakness   HPI Heidi Whitaker is a 87 y.o. female with a past medical history of DM, HTN, and DVT not anticoagulated who presents for assessment of some soreness in both of her lower legs that started earlier today.  Patient also notes that she had a hot flash and felt a little "off" yesterday.  No other clear associated symptoms including headache, fevers, chills, chest pain, cough, shortness of breath, Donnell pain, back pain, urinary symptoms, vomiting, diarrhea, dysuria, rash or recent injuries or falls.  She states that he was feeling quite achy in her legs earlier today but it is since eased off and she feels much better now.  She has not taken anything today for her symptoms.  No prior similar episodes.  No clear alleviating or improving factors.  He has never been diagnosed with heart failure but does take Lasix 20 mg as needed for any leg swelling prescribed by her PCP and has not been taking this anymore than usual last couple days.         Past Medical History:  Diagnosis Date  . Diabetes mellitus without complication ([place])   . DVT (deep venous thrombosis) ([place])    06/02/2019  . Hard of hearing   . Hypertension     Patient Active Problem List   Diagnosis Date Noted  . Stage 3a chronic kidney disease ([place]) 11/29/2020  . Hypertension associated with diabetes ([place]) 11/29/2020  . Aortic atherosclerosis ([place]) 11/29/2020  . Tubulovillous adenoma of rectum 03/05/2020  . Vitamin D deficiency 06/09/2019  . Atrial fibrillation with RVR ([place]) 06/09/2019  . Leukocytosis 06/09/2019  . Acute deep vein thrombosis (DVT) of distal vein of left lower extremity ([place]) 06/09/2019  .  Leg edema 06/09/2019  . Diabetes mellitus type 2 in obese ([place])   . Rectal mass 05/17/2019  . Insomnia 09/30/2018  . Abnormal gait 09/30/2018  . Bilateral impacted cerumen 09/30/2018  . Chronic diarrhea 09/30/2018  . Type 2 diabetes mellitus with stage 3 chronic kidney disease, without long-term current use of insulin ([place]) 09/30/2018  . Hyperlipidemia 09/30/2018  . Nosebleed 09/30/2018  . Does use hearing aid 09/30/2018  . Positive colorectal cancer screening using Cologuard test 08/12/2018  . HTN (hypertension) 08/12/2018    Past Surgical History:  Procedure Laterality Date  . CESAREAN SECTION    . COLONOSCOPY N/A 05/17/2019   Procedure: FLEXIBLE SIGMOIDOSCOPY;  Surgeon: Ileana Roup, MD;  Location: [facility] ORS;  Service: General;  Laterality: N/A;  . COLONOSCOPY WITH PROPOFOL N/A 01/09/2019   Procedure: COLONOSCOPY WITH PROPOFOL;  Surgeon: Lin Landsman, MD;  Location: [facility] ENDOSCOPY;  Service: Gastroenterology;  Laterality: N/A;  . ESOPHAGOGASTRODUODENOSCOPY (EGD) WITH PROPOFOL N/A 01/09/2019   Procedure: ESOPHAGOGASTRODUODENOSCOPY (EGD) WITH PROPOFOL;  Surgeon: Lin Landsman, MD;  Location: [facility] ENDOSCOPY;  Service: Gastroenterology;  Laterality: N/A;  . KNEE SURGERY     left 2017/2018  . SHOULDER SURGERY     right  . XI ROBOTIC ASSISTED LOWER ANTERIOR RESECTION N/A 05/17/2019   Procedure: XI ROBOTIC ASSISTED LOWER ANTERIOR RESECTION WITH END COLOSTOMY;  Surgeon: Ileana Roup, MD;  Location: [facility] ORS;  Service: General;  Laterality: N/A;    Prior to Admission medications   Medication  Sig Start Date End Date Taking? Authorizing Provider  aspirin 81 MG chewable tablet Chew 81 mg by mouth daily.    [provider]  furosemide (LASIX) 20 MG tablet Take 1 tablet (20 mg total) by mouth daily as needed. 11/29/20   McLean-Scocuzza, Nino Glow, MD  glimepiride (AMARYL) 2 MG tablet Take 1 tablet (2 mg total) by mouth daily with breakfast. 11/29/20    McLean-Scocuzza, Nino Glow, MD  lisinopril (ZESTRIL) 20 MG tablet Take 1 tablet (20 mg total) by mouth daily. Do not cut in 1/2 dose increased dose to 20 mg 11/29/20   McLean-Scocuzza, Nino Glow, MD  lovastatin (MEVACOR) 20 MG tablet Take 1 tablet (20 mg total) by mouth daily at 6 PM. 11/29/20   McLean-Scocuzza, Nino Glow, MD  metoprolol succinate (TOPROL-XL) 100 MG 24 hr tablet Take 1 tablet (100 mg total) by mouth daily. Take with or immediately following a meal. 11/29/20   McLean-Scocuzza, Nino Glow, MD    Allergies Metformin and related  Family History  Problem Relation Age of Onset  . Addison's disease Maternal Aunt   . Heart disease Mother   . Heart disease Father     Social History Social History   Tobacco Use  . Smoking status: Former Smoker    Years: 4.00  . Smokeless tobacco: Never Used  . Tobacco comment: 3 per day  Vaping Use  . Vaping Use: Never used  Substance Use Topics  . Alcohol use: No  . Drug use: No    Review of Systems  Review of Systems  Constitutional: Positive for malaise/fatigue. Negative for chills and fever.  HENT: Negative for sore throat.   Eyes: Negative for pain.  Respiratory: Positive for shortness of breath ( w/ minimal exertion ). Negative for cough and stridor.   Cardiovascular: Positive for orthopnea. Negative for chest pain.  Gastrointestinal: Negative for abdominal pain, nausea and vomiting.  Genitourinary: Negative for dysuria.  Musculoskeletal: Positive for myalgias ( b/l lower legs).  Skin: Negative for rash.  Neurological: Negative for seizures, loss of consciousness and headaches.  Psychiatric/Behavioral: Negative for suicidal ideas.  All other systems reviewed and are negative.     ____________________________________________   PHYSICAL EXAM:  VITAL SIGNS: ED Triage Vitals  Enc Vitals Group     BP 05/14/21 1531 (!) 151/70     Pulse Rate 05/14/21 1531 65     Resp 05/14/21 1531 18     Temp 05/14/21 1531 97.9 F (36.6 C)      Temp src --      SpO2 05/14/21 1531 100 %     Weight --      Height --      Head Circumference --      Peak Flow --      Pain Score 05/14/21 1530 0     Pain Loc --      Pain Edu? --      Excl. in [place]? --    Vitals:   05/14/21 1730 05/14/21 1921  BP: 130/61 (!) 143/65  Pulse: 62 65  Resp: (!) 22 16  Temp:    SpO2: 91% 97%   Physical Exam Vitals and nursing note reviewed.  Constitutional:      General: She is not in acute distress.    Appearance: She is well-developed. She is obese.  HENT:     Head: Normocephalic and atraumatic.     Right Ear: External ear normal.     Left Ear: External ear normal.  Nose: Nose normal.  Eyes:     Conjunctiva/sclera: Conjunctivae normal.  Cardiovascular:     Rate and Rhythm: Normal rate and regular rhythm.     Heart sounds: No murmur heard.   Pulmonary:     Effort: Pulmonary effort is normal. No respiratory distress.     Breath sounds: Normal breath sounds.  Abdominal:     Palpations: Abdomen is soft.     Tenderness: There is no abdominal tenderness.  Musculoskeletal:     Cervical back: Neck supple.     Right lower leg: Edema present.     Left lower leg: Edema present.  Skin:    General: Skin is warm and dry.     Capillary Refill: Capillary refill takes less than 2 seconds.  Neurological:     Mental Status: She is alert and oriented to person, place, and time.  Psychiatric:        Mood and Affect: Mood normal.     2+ radial and DP pulses.  Patient has full strength on her bilateral lower extremities.  There is no large joint effusions or significant erythema point tenderness edema or other clear overlying skin changes.  Sensation is intact light touch throughout both lower extremities. ____________________________________________   LABS (all labs ordered are listed, but only abnormal results are displayed)  Labs Reviewed  BASIC METABOLIC PANEL - Abnormal; Notable for the following components:      Result Value   Glucose,  Bld 114 (*)    BUN 27 (*)    GFR, Estimated 55 (*)    All other components within normal limits  URINALYSIS, COMPLETE (UACMP) WITH MICROSCOPIC - Abnormal; Notable for the following components:   Color, Urine STRAW (*)    APPearance CLEAR (*)    Specific Gravity, Urine 1.004 (*)    Leukocytes,Ua SMALL (*)    All other components within normal limits  BRAIN NATRIURETIC PEPTIDE - Abnormal; Notable for the following components:   B Natriuretic Peptide 191.8 (*)    All other components within normal limits  D-DIMER, QUANTITATIVE - Abnormal; Notable for the following components:   D-Dimer, Quant 3.26 (*)    All other components within normal limits  TROPONIN I (HIGH SENSITIVITY) - Abnormal; Notable for the following components:   Troponin I (High Sensitivity) 20 (*)    All other components within normal limits  TROPONIN I (HIGH SENSITIVITY) - Abnormal; Notable for the following components:   Troponin I (High Sensitivity) 22 (*)    All other components within normal limits  RESP PANEL BY RT-PCR (FLU A&B, COVID) ARPGX2  CBC  MAGNESIUM  CK  CBG MONITORING, ED   ____________________________________________  EKG  Sinus rhythm with a ventricular rate of 63, unremarkable intervals without clear evidence of acute ischemia. ____________________________________________  RADIOLOGY  ED MD interpretation: Chest x-ray shows some evidence of cardiomegaly and mild interstitial edema.  No focal consolidation, effusion, or other clear acute intrathoracic process.  No evidence of DVT on ultrasound of the bilateral lower extremities.  CTA chest has no evidence of PE, pneumothorax or clear focal consolidation but there is a small right effusion and patchy mosaic pattern with some mild bilateral atelectasis.  Official radiology report(s): DG Chest 1 View  Result Date: 05/14/2021 CLINICAL DATA:  Shortness of breath for 1 day EXAM: CHEST  1 VIEW COMPARISON:  None. FINDINGS: Cardiac shadow is enlarged.  Mild vascular congestion is noted with interstitial edema. Aortic calcifications are noted. No sizable effusion is seen. Postsurgical changes in  the right shoulder are noted. IMPRESSION: Changes of mild CHF. Electronically Signed   By: Inez Catalina M.D.   On: 05/14/2021 16:21   CT Angio Chest PE W and/or Wo Contrast  Result Date: 05/14/2021 CLINICAL DATA:  Weakness. EXAM: CT ANGIOGRAPHY CHEST WITH CONTRAST TECHNIQUE: Multidetector CT imaging of the chest was performed using the standard protocol during bolus administration of intravenous contrast. Multiplanar CT image reconstructions and MIPs were obtained to evaluate the vascular anatomy. CONTRAST:  157mL OMNIPAQUE IOHEXOL 350 MG/ML SOLN COMPARISON:  None. FINDINGS: Cardiovascular: Moderate severity calcification of the aortic arch is seen. Satisfactory opacification of the pulmonary arteries to the segmental level. No evidence of pulmonary embolism. Normal heart size with marked severity coronary artery calcification. No pericardial effusion. Mediastinum/Nodes: No enlarged mediastinal, hilar, or axillary lymph nodes. Thyroid gland, trachea, and esophagus demonstrate no significant findings. Lungs/Pleura: Mild diffuse atelectatic changes are seen throughout both lungs. A mild, patchy mosaic pattern is also seen bilaterally. There is a small right pleural effusion. No pneumothorax identified. Upper Abdomen: No acute abnormality. Musculoskeletal: A right shoulder replacement is noted. Degenerative changes are seen throughout the thoracic spine. Review of the MIP images confirms the above findings. IMPRESSION: 1. No evidence of pulmonary embolism. 2. Mild patchy mosaic pattern with mild bilateral atelectasis. 3. Small right effusion. 4. Right shoulder replacement. Electronically Signed   By: Virgina Norfolk M.D.   On: 05/14/2021 20:29   US Venous Img Lower Bilateral  Result Date: 05/14/2021 CLINICAL DATA:  Bilateral lower extremity swelling. EXAM: BILATERAL  LOWER EXTREMITY VENOUS DOPPLER ULTRASOUND TECHNIQUE: Gray-scale sonography with graded compression, as well as color Doppler and duplex ultrasound were performed to evaluate the lower extremity deep venous systems from the level of the common femoral vein and including the common femoral, femoral, profunda femoral, popliteal and calf veins including the posterior tibial, peroneal and gastrocnemius veins when visible. The superficial great saphenous vein was also interrogated. Spectral Doppler was utilized to evaluate flow at rest and with distal augmentation maneuvers in the common femoral, femoral and popliteal veins. COMPARISON:  08/14/2019, 06/02/2019 FINDINGS: RIGHT LOWER EXTREMITY Common Femoral Vein: No evidence of thrombus. Normal compressibility, respiratory phasicity and response to augmentation. Saphenofemoral Junction: No evidence of thrombus. Normal compressibility and flow on color Doppler imaging. Profunda Femoral Vein: No evidence of thrombus. Normal compressibility and flow on color Doppler imaging. Femoral Vein: No evidence of thrombus. Normal compressibility, respiratory phasicity and response to augmentation. Popliteal Vein: No evidence of thrombus. Normal compressibility, respiratory phasicity and response to augmentation. Calf Veins: No evidence of thrombus. Normal compressibility and flow on color Doppler imaging. Superficial Great Saphenous Vein: No evidence of thrombus. Normal compressibility. Venous Reflux:  None. Other Findings:  None. LEFT LOWER EXTREMITY Common Femoral Vein: No evidence of thrombus. Normal compressibility, respiratory phasicity and response to augmentation. Saphenofemoral Junction: No evidence of thrombus. Normal compressibility and flow on color Doppler imaging. Profunda Femoral Vein: No evidence of thrombus. Normal compressibility and flow on color Doppler imaging. Femoral Vein: No evidence of thrombus. Normal compressibility, respiratory phasicity and response to  augmentation. Popliteal Vein: No evidence of thrombus. Normal compressibility, respiratory phasicity and response to augmentation. Calf Veins: No evidence of thrombus. Normal compressibility and flow on color Doppler imaging. Superficial Great Saphenous Vein: No evidence of thrombus. Normal compressibility. Venous Reflux:  None. Other Findings:  None. IMPRESSION: No evidence of deep venous thrombosis in either lower extremity. Electronically Signed   By: Inez Catalina M.D.   On: 05/14/2021 18:24  ____________________________________________   PROCEDURES  Procedure(s) performed (including Critical Care):  .Critical Care Performed by: Lucrezia Starch, MD Authorized by: Lucrezia Starch, MD   Critical care provider statement:    Critical care time (minutes):  45   Critical care was necessary to treat or prevent imminent or life-threatening deterioration of the following conditions:  Respiratory failure   Critical care was time spent personally by me on the following activities:  Discussions with consultants, evaluation of patient's response to treatment, examination of patient, ordering and performing treatments and interventions, ordering and review of laboratory studies, ordering and review of radiographic studies, pulse oximetry, re-evaluation of patient's condition, obtaining history from patient or surrogate and review of old charts     ____________________________________________   INITIAL IMPRESSION / [place] / ED COURSE      Patient presents with above-stated history and exam for assessment of feeling off yesterday and some hot flash and some soreness in both lower legs today that started fairly suddenly and seem to have since eased off.  On arrival she is hypertensive with BP of 131/70 with otherwise stable vital signs on room air.  However when patient ambulate to use the restroom she was noted to desat to 87% and became quite tachypneic which improved back to 92%  on 2 L.  Patient states on further questioning that she will often get this way if she is exerting herself.  He also endorses orthopnea  Differential includes possible myalgia related to acute viral infection, myositis, DVT, and neuropathy.  No evidence on exam of a cellulitis or any history or exam features to suggest acute trauma.  She has a good DP pulses and I have a very low suspicion for acute arterial embolus or occlusion.  With regard to her hot flash and feeling off yesterday is possible this was a midcycle presentation for ACS and so we will check an EKG and troponin.  Chest x-ray ordered in triage shows no evidence of effusion,, pneumothorax or findings suggestive of pneumonia although some edema and given edema in lower extremities on exam consistent with some mild heart failure.  ECG shows some nonspecific changes without significant arrhythmia and troponin stable x2/2 hours at 20 and 22.  Suspect this represents some mild demand ischemia secondary to undiagnosed heart failure.  BNP slightly elevated at 191.  Flu and COVID is negative.  CTA obtained due to elevated D-dimer at 3.26  has no evidence of PE, pneumothorax or clear focal consolidation but there is a small right effusion and patchy mosaic pattern with some mild bilateral atelectasis.  BNP is 191. Marland Kitchen Suspect patient's respiratory failure noted on some minimal ambulation which improved with 2 L supplemental oxygen is likely secondary to some undiagnosed heart failure.  Suspect her mildly elevated troponins are secondary to mild demand ischemia.  I will admit to medicine service for further evaluation and management.        ____________________________________________   FINAL CLINICAL IMPRESSION(S) / ED DIAGNOSES  Final diagnoses:  Acute respiratory failure with hypoxia (HCC)  Troponin I above reference range  Edema, unspecified type  Positive D dimer  Elevated brain natriuretic peptide (BNP) level    Medications   furosemide (LASIX) injection 40 mg (40 mg Intravenous Given 05/14/21 1924)  iohexol (OMNIPAQUE) 350 MG/ML injection 100 mL (100 mLs Intravenous Contrast Given 05/14/21 1958)     ED Discharge Orders    None       Note:  This document was prepared using  [employer] training and education officer and may include unintentional dictation errors.   Lucrezia Starch, MD 05/14/21 2102    Lucrezia Starch, MD 05/14/21 2123

## 2021-05-14 NOTE — ED Notes (Addendum)
Pt c/o cramps in right calf, Raquel Sarna, RN made aware. Pt given a [place] sandwich tray per request. Pt stated, "I can not stay in this bed with my leg hurting. I am going to get out of bed myself." Pt made aware that she needed to stay in bed due to her O2 level dropping.

## 2021-05-14 NOTE — ED Notes (Signed)
Pt noted to be sitting on edge of bed, back from assistance with EDT to bathroom. Daughter stated she soiled clothing and linens. This RN asked pt if she would like to be changed and placed on purewick so that she doesn't have to keep getting up to use restroom. Pt agreed but states she doesn't know what she wants to do right now until doctor tells her the plan. Pt educated that EDP has to wait for results of CT before he'll know the plan. Pt and daughter understand.

## 2021-05-15 ENCOUNTER — Inpatient Hospital Stay: Payer: Medicare HMO

## 2021-05-15 ENCOUNTER — Observation Stay: Payer: Medicare HMO

## 2021-05-15 ENCOUNTER — Inpatient Hospital Stay
Admit: 2021-05-15 | Discharge: 2021-05-15 | Disposition: A | Discharge status: Home / Self Care | Payer: Medicare HMO | Attending: Internal Medicine | Admitting: Internal Medicine

## 2021-05-15 ENCOUNTER — Encounter: Payer: Self-pay | Admitting: Internal Medicine

## 2021-05-15 DIAGNOSIS — E1122 Type 2 diabetes mellitus with diabetic chronic kidney disease: Secondary | ICD-10-CM | POA: Diagnosis not present

## 2021-05-15 DIAGNOSIS — R0603 Acute respiratory distress: Secondary | ICD-10-CM | POA: Diagnosis not present

## 2021-05-15 DIAGNOSIS — H9190 Unspecified hearing loss, unspecified ear: Secondary | ICD-10-CM | POA: Diagnosis not present

## 2021-05-15 DIAGNOSIS — R0600 Dyspnea, unspecified: Secondary | ICD-10-CM | POA: Diagnosis present

## 2021-05-15 DIAGNOSIS — R7989 Other specified abnormal findings of blood chemistry: Secondary | ICD-10-CM

## 2021-05-15 DIAGNOSIS — F0150 Vascular dementia without behavioral disturbance: Secondary | ICD-10-CM | POA: Diagnosis present

## 2021-05-15 DIAGNOSIS — I130 Hypertensive heart and chronic kidney disease with heart failure and stage 1 through stage 4 chronic kidney disease, or unspecified chronic kidney disease: Secondary | ICD-10-CM | POA: Diagnosis not present

## 2021-05-15 DIAGNOSIS — Z8249 Family history of ischemic heart disease and other diseases of the circulatory system: Secondary | ICD-10-CM | POA: Diagnosis not present

## 2021-05-15 DIAGNOSIS — I700 Atherosclerosis of aorta: Secondary | ICD-10-CM | POA: Diagnosis present

## 2021-05-15 DIAGNOSIS — I739 Peripheral vascular disease, unspecified: Secondary | ICD-10-CM | POA: Diagnosis not present

## 2021-05-15 DIAGNOSIS — E1151 Type 2 diabetes mellitus with diabetic peripheral angiopathy without gangrene: Secondary | ICD-10-CM | POA: Diagnosis not present

## 2021-05-15 DIAGNOSIS — Z933 Colostomy status: Secondary | ICD-10-CM | POA: Diagnosis not present

## 2021-05-15 DIAGNOSIS — R200 Anesthesia of skin: Secondary | ICD-10-CM | POA: Diagnosis present

## 2021-05-15 DIAGNOSIS — Z87891 Personal history of nicotine dependence: Secondary | ICD-10-CM | POA: Diagnosis not present

## 2021-05-15 DIAGNOSIS — Z7982 Long term (current) use of aspirin: Secondary | ICD-10-CM | POA: Diagnosis not present

## 2021-05-15 DIAGNOSIS — G4700 Insomnia, unspecified: Secondary | ICD-10-CM | POA: Diagnosis present

## 2021-05-15 DIAGNOSIS — E785 Hyperlipidemia, unspecified: Secondary | ICD-10-CM | POA: Diagnosis present

## 2021-05-15 DIAGNOSIS — N951 Menopausal and female climacteric states: Secondary | ICD-10-CM | POA: Diagnosis present

## 2021-05-15 DIAGNOSIS — Z7984 Long term (current) use of oral hypoglycemic drugs: Secondary | ICD-10-CM | POA: Diagnosis not present

## 2021-05-15 DIAGNOSIS — Z79899 Other long term (current) drug therapy: Secondary | ICD-10-CM | POA: Diagnosis not present

## 2021-05-15 DIAGNOSIS — N1831 Chronic kidney disease, stage 3a: Secondary | ICD-10-CM | POA: Diagnosis not present

## 2021-05-15 DIAGNOSIS — E876 Hypokalemia: Secondary | ICD-10-CM | POA: Diagnosis not present

## 2021-05-15 DIAGNOSIS — Z66 Do not resuscitate: Secondary | ICD-10-CM | POA: Diagnosis present

## 2021-05-15 DIAGNOSIS — J9601 Acute respiratory failure with hypoxia: Secondary | ICD-10-CM | POA: Diagnosis not present

## 2021-05-15 DIAGNOSIS — Z20822 Contact with and (suspected) exposure to covid-19: Secondary | ICD-10-CM | POA: Diagnosis not present

## 2021-05-15 DIAGNOSIS — Z86718 Personal history of other venous thrombosis and embolism: Secondary | ICD-10-CM | POA: Diagnosis not present

## 2021-05-15 DIAGNOSIS — Z888 Allergy status to other drugs, medicaments and biological substances status: Secondary | ICD-10-CM | POA: Diagnosis not present

## 2021-05-15 DIAGNOSIS — G319 Degenerative disease of nervous system, unspecified: Secondary | ICD-10-CM | POA: Diagnosis not present

## 2021-05-15 DIAGNOSIS — I5021 Acute systolic (congestive) heart failure: Secondary | ICD-10-CM | POA: Diagnosis not present

## 2021-05-15 DIAGNOSIS — R0602 Shortness of breath: Secondary | ICD-10-CM | POA: Diagnosis not present

## 2021-05-15 LAB — VITAMIN B12: Vitamin B-12: 512 pg/mL (ref 180–914)

## 2021-05-15 LAB — GLUCOSE, CAPILLARY: Glucose-Capillary: 134 mg/dL — ABNORMAL HIGH (ref 70–99)

## 2021-05-15 LAB — HEMOGLOBIN A1C
Hgb A1c MFr Bld: 7 % — ABNORMAL HIGH (ref 4.8–5.6)
Mean Plasma Glucose: 154.2 mg/dL

## 2021-05-15 LAB — BASIC METABOLIC PANEL
Anion gap: 11 (ref 5–15)
BUN: 23 mg/dL (ref 8–23)
CO2: 22 mmol/L (ref 22–32)
Calcium: 9.3 mg/dL (ref 8.9–10.3)
Chloride: 106 mmol/L (ref 98–111)
Creatinine, Ser: 1.04 mg/dL — ABNORMAL HIGH (ref 0.44–1.00)
GFR, Estimated: 51 mL/min — ABNORMAL LOW (ref >60)
Glucose, Bld: 133 mg/dL — ABNORMAL HIGH (ref 70–99)
Potassium: 3.9 mmol/L (ref 3.5–5.1)
Sodium: 139 mmol/L (ref 135–145)

## 2021-05-15 LAB — CBC
HCT: 40.5 % (ref 36.0–46.0)
Hemoglobin: 13.3 g/dL (ref 12.0–15.0)
MCH: 29.8 pg (ref 26.0–34.0)
MCHC: 32.8 g/dL (ref 30.0–36.0)
MCV: 90.6 fL (ref 80.0–100.0)
Platelets: 239 10*3/uL (ref 150–400)
RBC: 4.47 MIL/uL (ref 3.87–5.11)
RDW: 14.3 % (ref 11.5–15.5)
WBC: 11.9 10*3/uL — ABNORMAL HIGH (ref 4.0–10.5)
nRBC: 0 % (ref 0.0–0.2)

## 2021-05-15 LAB — CBG MONITORING, ED
Glucose-Capillary: 100 mg/dL — ABNORMAL HIGH (ref 70–99)
Glucose-Capillary: 174 mg/dL — ABNORMAL HIGH (ref 70–99)

## 2021-05-15 LAB — TSH: TSH: 3.788 u[IU]/mL (ref 0.350–4.500)

## 2021-05-15 MED ORDER — CYCLOBENZAPRINE HCL 10 MG PO TABS
5.0000 mg | ORAL_TABLET | Freq: Three times a day (TID) | ORAL | Status: DC | PRN
  Administered 2021-05-15: 5 mg via ORAL
  Filled 2021-05-15: qty 1

## 2021-05-15 MED ORDER — ONDANSETRON HCL 4 MG/2ML IJ SOLN: 4.0000 mg | INTRAMUSCULAR | Status: DC | PRN

## 2021-05-15 NOTE — ED Notes (Signed)
Pt assisted with breakfast tray

## 2021-05-15 NOTE — Evaluation (Signed)
Physical Therapy Evaluation Patient Details Name: Heidi Whitaker MRN: [id number] DOB: 1929/04/17 Today's Date: 05/15/2021   History of Present Illness  Pt is an 87 yo female that prestend to ED for concerns of leg pain, hot flashes. Pt noted to have acute dyspnea with hypoxia with mobilization, workup for possible CHF. PMH of DVT, DM, HOH, HTN, colostomy.    Clinical Impression  Pt alert, agreeable to PT, reported intermittent LE cramping. Did experience L calf cramps, pt exhibited significant pain signs/symptoms with cramp, assisted with stretching and positioning. Pt reported at baseline she is independent in ADLs, lives with her daughter and is primarily homebound.  The patient was on 1L at start of session, trialed on room air. Pt on room air throughout speaking to PT and during mobility, lowest reading of 90%. Returned to 1L at end of session, RN notified of patient status. She was able to perform supine to sit CGA, good sitting balance noted. Sit <> stand with RW and CGA, with time pt able to weight bear on LLE (difficulty initially due to cramps). She ambulated ~46ft in the room with CGA and cues for RW use. Returned to supine minA for pt comfort due to elevated gurney. PT also assisted with donning of socks, linen, brief, and gown change.  Overall the patient demonstrated deficits (see "PT Problem List") that impede the patient's functional abilities, safety, and mobility and would benefit from skilled PT intervention. Recommendation is HHPT with intermittent supervision.    Follow Up Recommendations Home health PT;Supervision - Intermittent    Equipment Recommendations  Rolling walker with 5" wheels    Recommendations for Other Services       Precautions / Restrictions Precautions Precautions: Fall Precaution Comments: colostomy bag Restrictions Weight Bearing Restrictions: No      Mobility  Bed Mobility Overal bed mobility: Needs Assistance Bed Mobility: Supine to  Sit;Sit to Supine     Supine to sit: Min guard Sit to supine: Min assist   General bed mobility comments: minA to lay down for pt comfort    Transfers Overall transfer level: Needs assistance Equipment used: Rolling walker (2 wheeled) Transfers: Sit to/from Stand Sit to Stand: Min guard            Ambulation/Gait   Gait Distance (Feet): 25 Feet Assistive device: Rolling walker (2 wheeled)       General Gait Details: decreased velocity, no unsteadiness noted but pt did endorse feeling weak  Stairs            Wheelchair Mobility    Modified Rankin (Stroke Patients Only)       Balance Overall balance assessment: Needs assistance Sitting-balance support: Feet supported Sitting balance-Leahy Scale: Good       Standing balance-Leahy Scale: Fair Standing balance comment: UE support for all dynamic/ambulation tasks                             Pertinent Vitals/Pain Pain Assessment: Faces Faces Pain Scale: Hurts whole lot Pain Location: with leg cramps, L >R Pain Descriptors / Indicators: Aching;Moaning;Spasm Pain Intervention(s): Limited activity within patient's tolerance;Monitored during session;Repositioned;Patient requesting pain meds-RN notified    Home Living Family/patient expects to be discharged to:: Private residence Living Arrangements: Children Available Help at Discharge: Family Type of Home: House Home Access: Stairs to enter Entrance Stairs-Rails: Psychiatric nurse of Steps: 4-5 Home Layout: Two level Home Equipment: Kasandra Knudsen - single point Additional Comments: daughter home during  the day    Prior Function Level of Independence: Independent               Hand Dominance        Extremity/Trunk Assessment   Upper Extremity Assessment Upper Extremity Assessment: Overall WFL for tasks assessed    Lower Extremity Assessment Lower Extremity Assessment: Generalized weakness    Cervical / Trunk  Assessment Cervical / Trunk Assessment: Normal  Communication   Communication: HOH  Cognition Arousal/Alertness: Awake/alert Behavior During Therapy: WFL for tasks assessed/performed Overall Cognitive Status: Within Functional Limits for tasks assessed                                        General Comments      Exercises     Assessment/Plan    PT Assessment Patient needs continued PT services  PT Problem List Decreased strength;Decreased activity tolerance;Decreased balance;Decreased mobility;Decreased knowledge of precautions       PT Treatment Interventions DME instruction;Balance training;Gait training;Neuromuscular re-education;Stair training;Functional mobility training;Patient/family education;Therapeutic activities;Therapeutic exercise    PT Goals (Current goals can be found in the Care Plan section)  Acute Rehab PT Goals Patient Stated Goal: to go home PT Goal Formulation: With patient Time For Goal Achievement: 05/29/21 Potential to Achieve Goals: Good    Frequency Min 2X/week   Barriers to discharge        Co-evaluation               AM-PAC PT "6 Clicks" Mobility  Outcome Measure Help needed turning from your back to your side while in a flat bed without using bedrails?: None Help needed moving from lying on your back to sitting on the side of a flat bed without using bedrails?: A Little Help needed moving to and from a bed to a chair (including a wheelchair)?: A Little Help needed standing up from a chair using your arms (e.g., wheelchair or bedside chair)?: A Little Help needed to walk in hospital room?: A Little Help needed climbing 3-5 steps with a railing? : A Little 6 Click Score: 19    End of Session Equipment Utilized During Treatment: Gait belt;Oxygen (pt on 1L at start/end of session) Activity Tolerance: Patient tolerated treatment well Patient left: in bed;with call bell/phone within reach Nurse Communication: Mobility  status PT Visit Diagnosis: Other abnormalities of gait and mobility (R26.89);Difficulty in walking, not elsewhere classified (R26.2);Muscle weakness (generalized) (M62.81)    Time: 5027-7412 PT Time Calculation (min) (ACUTE ONLY): 33 min   Charges:   PT Evaluation $PT Eval Low Complexity: 1 Low PT Treatments $Therapeutic Exercise: 8-22 mins $Therapeutic Activity: 8-22 mins        Lieutenant Diego PT, DPT 1:53 PM,05/15/21

## 2021-05-15 NOTE — ED Notes (Signed)
Per PT, pt tolerated ambulation well. Per PT, did not see oxygen saturation drop below 90% during ambulation trial. Per PT, pt c/o leg cramps at this time.

## 2021-05-15 NOTE — Progress Notes (Addendum)
PROGRESS NOTE    Heidi Whitaker  [id number] DOB: September 13, 1929 DOA: 05/14/2021 PCP: McLean-Scocuzza, Nino Glow, MD    Chief Complaint  Patient presents with  . Weakness    Brief Narrative:   Heidi Whitaker is a  87 y.o. pleasant female with medical history significant for diabetes mellitus, hypertension, status post diverting colostomy in 2018 and DVT.  Patient initially presented with concerns for some soreness and numbness in both feet for few days duration.  She also complains of having some hot flashes and being out of her baseline.  Daughter was concerned decided to bring patient to the emergency room to be evaluated.  At time of evaluation, patient denied any dysuria or increased frequency micturition.  Denies any fever or chills.  Denies nausea or vomiting..  Denied any headache.  She also denied any shortness of breath but admits to easy fatigability which she attributes to her age.  She denies chest pain or palpitations.  Patient was being planned for possible discharge however upon ambulation, patient desaturated.  This was different from her baseline.  In the ED, patient was extensively worked up, D-dimer came back elevated.  CT angiogram was subsequently done ruled out PE.  CT scan did show some evidence of atelectasis.  At baseline, patient is on as needed Lasix.  BNP was mildly elevated. Patient was referred to hospitalist service for admission due to concern for possible CHF.  Patient has no prior known prior history of CHF.  She has no echocardiogram on file.     Assessment & Plan:   Active Problems:   Generalized weakness   Acute dyspnea with hypoxia: -Mild volume overload given some mild pleural effusion on presentation, elevated BNP, she was on 1 to 2 L oxygen, but this has significantly improved with gentle IV diuresis, will follow on 2D echo to determine systolic versus diastolic CHF . -CTA chest negative for PE.  Elevated D-dimers -CTA chest negative  for pulmonary embolism, venous Dopplers negative for DVT  Small right-sided pleural effusion: No indication for thoracentesis.  Dyspnea improving with diuresis  Lower extremity pain and numbness: -Couplers negative for DVT, it happened while she was walking, will check ABI to rule out claudication.  Near syncope -Daughter reports near syncope over last couple days, lightheadedness, almost passing out, will check echo and monitor on telemetry -Will check MRI of the brain -Check [id number], folic acid and TSH  History of brief A. fib/RVR intraoperatively in 2020.  Will monitor on telemetry overnight for any arrhythmias.  EKG shows sinus rhythm at this time.  Diabetes mellitus: On glimepiride: Fingerstick glucose monitoring as per protocol.  Insulin sliding scale at this time.  We will hold oral hypoglycemics.    DVT prophylaxis: Lovenox Code Status: Full Family Communication: daugther by phone Disposition:   Status is: Observation  The patient will require care spanning > 2 midnights and should be moved to inpatient because: IV treatments appropriate due to intensity of illness or inability to take PO  Dispo: The patient is from: Home              Anticipated d/c is to: Home              Patient currently is medically stable to d/c.   Difficult to place patient No       Consultants:   None  Subjective:  Patient extremely hard of hearing, extremity pain, some dyspnea.  Objective: Vitals:   05/15/21 0900 05/15/21 1000 05/15/21 1030  05/15/21 1521  BP: (!) 132/59 118/67 134/70 (!) 121/51  Pulse: 60 73 67 74  Resp:   18   Temp:      SpO2: 92% 94% 97% 99%  Weight:        Intake/Output Summary (Last 24 hours) at 05/15/2021 1537 Last data filed at 05/15/2021 1051 Gross per 24 hour  Intake --  Output 1600 ml  Net -1600 ml   Filed Weights   05/14/21 1922  Weight: 81.6 kg    Examination:  General exam: Appears calm and comfortable, she is extremely hard of  hearing Respiratory system: Bibasilar crackles Cardiovascular system: S1 & S2 heard, RRR. No JVD, murmurs, rubs, gallops or clicks. No pedal edema. Gastrointestinal system: Abdomen is nondistended, soft and nontender. No organomegaly or masses felt. Normal bowel sounds heard. Central nervous system: Alert and oriented. No focal neurological deficits. Extremities: Symmetric 5 x 5 power. Skin: No rashes, lesions or ulcers Psychiatry: Judgement and insight appear normal. Mood & affect appropriate.     Data Reviewed: I have personally reviewed following labs and imaging studies  CBC: Recent Labs  Lab 05/14/21 1531 05/15/21 0504  WBC 9.2 11.9*  HGB 12.2 13.3  HCT 37.4 40.5  MCV 91.2 90.6  PLT 231 355    Basic Metabolic Panel: Recent Labs  Lab 05/14/21 1531 05/15/21 0504  NA 136 139  K 4.6 3.9  CL 104 106  CO2 23 22  GLUCOSE 114* 133*  BUN 27* 23  CREATININE 0.99 1.04*  CALCIUM 9.3 9.3  MG 2.3  --     GFR: Estimated Creatinine Clearance: 37.9 mL/min (A) (by C-G formula based on SCr of 1.04 mg/dL (H)).  Liver Function Tests: No results for input(s): AST, ALT, ALKPHOS, BILITOT, PROT, ALBUMIN in the last 168 hours.  CBG: Recent Labs  Lab 05/14/21 2319 05/15/21 0759 05/15/21 1152  GLUCAP 114* 100* 174*     Recent Results (from the past 240 hour(s))  Resp Panel by RT-PCR (Flu A&B, Covid) Nasopharyngeal Swab     Status: None   Collection Time: 05/14/21  4:33 PM   Specimen: Nasopharyngeal Swab; Nasopharyngeal(NP) swabs in vial transport medium  Result Value Ref Range Status   SARS Coronavirus 2 by RT PCR NEGATIVE NEGATIVE Final    Comment: (NOTE) SARS-CoV-2 target nucleic acids are NOT DETECTED.  The SARS-CoV-2 RNA is generally detectable in upper respiratory specimens during the acute phase of infection. The lowest concentration of SARS-CoV-2 viral copies this assay can detect is 138 copies/mL. A negative result does not preclude SARS-Cov-2 infection and  should not be used as the sole basis for treatment or other patient management decisions. A negative result may occur with  improper specimen collection/handling, submission of specimen other than nasopharyngeal swab, presence of viral mutation(s) within the areas targeted by this assay, and inadequate number of viral copies(<138 copies/mL). A negative result must be combined with clinical observations, patient history, and epidemiological information. The expected result is Negative.  Fact Sheet for Patients:  EntrepreneurPulse.com.au  Fact Sheet for Healthcare Providers:  IncredibleEmployment.be  This test is no t yet approved or cleared by the [place] FDA and  has been authorized for detection and/or diagnosis of SARS-CoV-2 by FDA under an Emergency Use Authorization (EUA). This EUA will remain  in effect (meaning this test can be used) for the duration of the COVID-19 declaration under Section 564(b)(1) of the Act, 21 U.S.C.section 360bbb-3(b)(1), unless the authorization is terminated  or revoked sooner.  Influenza A by PCR NEGATIVE NEGATIVE Final   Influenza B by PCR NEGATIVE NEGATIVE Final    Comment: (NOTE) The Xpert Xpress SARS-CoV-2/FLU/RSV plus assay is intended as an aid in the diagnosis of influenza from Nasopharyngeal swab specimens and should not be used as a sole basis for treatment. Nasal washings and aspirates are unacceptable for Xpert Xpress SARS-CoV-2/FLU/RSV testing.  Fact Sheet for Patients: EntrepreneurPulse.com.au  Fact Sheet for Healthcare Providers: IncredibleEmployment.be  This test is not yet approved or cleared by the [place] FDA and has been authorized for detection and/or diagnosis of SARS-CoV-2 by FDA under an Emergency Use Authorization (EUA). This EUA will remain in effect (meaning this test can be used) for the duration of the COVID-19 declaration  under Section 564(b)(1) of the Act, 21 U.S.C. section 360bbb-3(b)(1), unless the authorization is terminated or revoked.  Performed at [facility], [address] [place], [place] [address]          Radiology Studies: DG Chest 1 View  Result Date: 05/14/2021 CLINICAL DATA:  Shortness of breath for 1 day EXAM: CHEST  1 VIEW COMPARISON:  None. FINDINGS: Cardiac shadow is enlarged. Mild vascular congestion is noted with interstitial edema. Aortic calcifications are noted. No sizable effusion is seen. Postsurgical changes in the right shoulder are noted. IMPRESSION: Changes of mild CHF. Electronically Signed   By: Inez Catalina M.D.   On: 05/14/2021 16:21   CT Angio Chest PE W and/or Wo Contrast  Result Date: 05/14/2021 CLINICAL DATA:  Weakness. EXAM: CT ANGIOGRAPHY CHEST WITH CONTRAST TECHNIQUE: Multidetector CT imaging of the chest was performed using the standard protocol during bolus administration of intravenous contrast. Multiplanar CT image reconstructions and MIPs were obtained to evaluate the vascular anatomy. CONTRAST:  165mL OMNIPAQUE IOHEXOL 350 MG/ML SOLN COMPARISON:  None. FINDINGS: Cardiovascular: Moderate severity calcification of the aortic arch is seen. Satisfactory opacification of the pulmonary arteries to the segmental level. No evidence of pulmonary embolism. Normal heart size with marked severity coronary artery calcification. No pericardial effusion. Mediastinum/Nodes: No enlarged mediastinal, hilar, or axillary lymph nodes. Thyroid gland, trachea, and esophagus demonstrate no significant findings. Lungs/Pleura: Mild diffuse atelectatic changes are seen throughout both lungs. A mild, patchy mosaic pattern is also seen bilaterally. There is a small right pleural effusion. No pneumothorax identified. Upper Abdomen: No acute abnormality. Musculoskeletal: A right shoulder replacement is noted. Degenerative changes are seen throughout the thoracic spine. Review of the  MIP images confirms the above findings. IMPRESSION: 1. No evidence of pulmonary embolism. 2. Mild patchy mosaic pattern with mild bilateral atelectasis. 3. Small right effusion. 4. Right shoulder replacement. Electronically Signed   By: Virgina Norfolk M.D.   On: 05/14/2021 20:29   US Venous Img Lower Bilateral  Result Date: 05/14/2021 CLINICAL DATA:  Bilateral lower extremity swelling. EXAM: BILATERAL LOWER EXTREMITY VENOUS DOPPLER ULTRASOUND TECHNIQUE: Gray-scale sonography with graded compression, as well as color Doppler and duplex ultrasound were performed to evaluate the lower extremity deep venous systems from the level of the common femoral vein and including the common femoral, femoral, profunda femoral, popliteal and calf veins including the posterior tibial, peroneal and gastrocnemius veins when visible. The superficial great saphenous vein was also interrogated. Spectral Doppler was utilized to evaluate flow at rest and with distal augmentation maneuvers in the common femoral, femoral and popliteal veins. COMPARISON:  08/14/2019, 06/02/2019 FINDINGS: RIGHT LOWER EXTREMITY Common Femoral Vein: No evidence of thrombus. Normal compressibility, respiratory phasicity and response to augmentation. Saphenofemoral Junction: No evidence of  thrombus. Normal compressibility and flow on color Doppler imaging. Profunda Femoral Vein: No evidence of thrombus. Normal compressibility and flow on color Doppler imaging. Femoral Vein: No evidence of thrombus. Normal compressibility, respiratory phasicity and response to augmentation. Popliteal Vein: No evidence of thrombus. Normal compressibility, respiratory phasicity and response to augmentation. Calf Veins: No evidence of thrombus. Normal compressibility and flow on color Doppler imaging. Superficial Great Saphenous Vein: No evidence of thrombus. Normal compressibility. Venous Reflux:  None. Other Findings:  None. LEFT LOWER EXTREMITY Common Femoral Vein: No  evidence of thrombus. Normal compressibility, respiratory phasicity and response to augmentation. Saphenofemoral Junction: No evidence of thrombus. Normal compressibility and flow on color Doppler imaging. Profunda Femoral Vein: No evidence of thrombus. Normal compressibility and flow on color Doppler imaging. Femoral Vein: No evidence of thrombus. Normal compressibility, respiratory phasicity and response to augmentation. Popliteal Vein: No evidence of thrombus. Normal compressibility, respiratory phasicity and response to augmentation. Calf Veins: No evidence of thrombus. Normal compressibility and flow on color Doppler imaging. Superficial Great Saphenous Vein: No evidence of thrombus. Normal compressibility. Venous Reflux:  None. Other Findings:  None. IMPRESSION: No evidence of deep venous thrombosis in either lower extremity. Electronically Signed   By: Inez Catalina M.D.   On: 05/14/2021 18:24        Scheduled Meds: . aspirin  81 mg Oral Daily  . enoxaparin (LOVENOX) injection  40 mg Subcutaneous Q24H  . furosemide  20 mg Intravenous Daily  . insulin aspart  0-6 Units Subcutaneous TID WC  . lisinopril  20 mg Oral Daily  . metoprolol succinate  100 mg Oral Daily  . pravastatin  20 mg Oral q1800   Continuous Infusions:   LOS: 0 days      Phillips Climes, MD [facility]   To contact the attending provider between 7A-7P or the covering provider during after hours 7P-7A, please log into the web site [email or web address] and access using universal [place] password for that web site. If you do not have the password, please call the hospital operator.  05/15/2021, 3:37 PM

## 2021-05-15 NOTE — ED Notes (Signed)
Lab called to redraw vitamin b12 at this time. Per lab, will draw

## 2021-05-15 NOTE — ED Notes (Signed)
Pt given phone to speak with daughter at this time.

## 2021-05-16 ENCOUNTER — Telehealth: Payer: Self-pay | Admitting: Internal Medicine

## 2021-05-16 DIAGNOSIS — R0602 Shortness of breath: Secondary | ICD-10-CM

## 2021-05-16 DIAGNOSIS — J9601 Acute respiratory failure with hypoxia: Secondary | ICD-10-CM

## 2021-05-16 DIAGNOSIS — I739 Peripheral vascular disease, unspecified: Secondary | ICD-10-CM

## 2021-05-16 LAB — CBC
HCT: 38.3 % (ref 36.0–46.0)
Hemoglobin: 12.9 g/dL (ref 12.0–15.0)
MCH: 30.4 pg (ref 26.0–34.0)
MCHC: 33.7 g/dL (ref 30.0–36.0)
MCV: 90.1 fL (ref 80.0–100.0)
Platelets: 231 10*3/uL (ref 150–400)
RBC: 4.25 MIL/uL (ref 3.87–5.11)
RDW: 14.6 % (ref 11.5–15.5)
WBC: 11.3 10*3/uL — ABNORMAL HIGH (ref 4.0–10.5)
nRBC: 0 % (ref 0.0–0.2)

## 2021-05-16 LAB — BASIC METABOLIC PANEL
Anion gap: 8 (ref 5–15)
BUN: 30 mg/dL — ABNORMAL HIGH (ref 8–23)
CO2: 26 mmol/L (ref 22–32)
Calcium: 9.4 mg/dL (ref 8.9–10.3)
Chloride: 104 mmol/L (ref 98–111)
Creatinine, Ser: 1.1 mg/dL — ABNORMAL HIGH (ref 0.44–1.00)
GFR, Estimated: 48 mL/min — ABNORMAL LOW (ref >60)
Glucose, Bld: 125 mg/dL — ABNORMAL HIGH (ref 70–99)
Potassium: 3.4 mmol/L — ABNORMAL LOW (ref 3.5–5.1)
Sodium: 138 mmol/L (ref 135–145)

## 2021-05-16 LAB — GLUCOSE, CAPILLARY
Glucose-Capillary: 116 mg/dL — ABNORMAL HIGH (ref 70–99)
Glucose-Capillary: 128 mg/dL — ABNORMAL HIGH (ref 70–99)

## 2021-05-16 LAB — ECHOCARDIOGRAM COMPLETE
Area-P 1/2: 2.6 cm2
S' Lateral: 2.61 cm
Weight: 2880 oz

## 2021-05-16 LAB — FOLATE: Folate: 16.8 ng/mL (ref >5.9)

## 2021-05-16 MED ORDER — POTASSIUM CHLORIDE CRYS ER 20 MEQ PO TBCR
40.0000 meq | EXTENDED_RELEASE_TABLET | Freq: Once | ORAL | Status: AC
  Administered 2021-05-16: 16:00:00 40 meq via ORAL
  Filled 2021-05-16: qty 2

## 2021-05-16 MED ORDER — LISINOPRIL 5 MG PO TABS: 5.0000 mg | ORAL_TABLET | Freq: Every day | ORAL | 0 refills | Status: DC

## 2021-05-16 MED ORDER — METOPROLOL SUCCINATE ER 25 MG PO TB24: 25.0000 mg | ORAL_TABLET | Freq: Every day | ORAL | 0 refills | Status: DC

## 2021-05-16 NOTE — TOC Progression Note (Addendum)
Transition of Care [facility]) - Progression Note    Patient Details  Name: ARUNA NESTLER MRN: [id number] Date of Birth: 06-03-1929  [facility]) CM/SW [place], RN Phone Number: 05/16/2021, 12:52 PM  Clinical Narrative:   TOC spoke briefly with patient, but communication was challenging due to patient being extremely hard of hearing.  Patient called her daughter on her personal cell for me to speak to.  Daughter states that patient lives at home with daughter and family.  No concerns with getting to appointments and getting medications.  Daughter states that patient prefers to have her bedroom upstairs in the family home, although a bedroom on the first floor was offered.  Daughter's concern is that patient will be unable to climb stairs with family assistance.  There are multiple family members in the home that can assist patient on the stairs, but cannot carry her.    [name] stated that she will work with her to help her sleep downstairs until [facility] can work with her.  Patient will go home with home health PT/OT as per orders.  [facility] accepted patient to their services.  Family notified  Expected Discharge Plan: Home/Self Care Barriers to Discharge: Continued Medical Work up  Expected Discharge Plan and Services Expected Discharge Plan: Home/Self Care   Discharge Planning Services: CM Consult Post Acute Care Choice:  (TBD) Living arrangements for the past 2 months: Single Family Home                                       Social Determinants of Health (SDOH) Interventions    Readmission Risk Interventions No flowsheet data found.

## 2021-05-16 NOTE — Discharge Summary (Signed)
Physician Discharge Summary  Heidi Whitaker [id number] DOB: [birth year] DOA: 05/14/2021  PCP: McLean-Scocuzza, Nino Glow, MD  Admit date: 05/14/2021 Discharge date: 05/16/2021  Admitted From: Home Disposition:  Home   Recommendations for Outpatient Follow-up:  1. Follow up with PCP in 1-2 weeks 2. Please obtain BMP/CBC in one week 3. Please follow up with vascular surgery as an outpatient  Home Health:YES Equipment/Devices:none  Discharge Condition:Stable CODE STATUS:DNR Diet recommendation: Heart Healthy / Carb Modified   Brief/Interim Summary:  Acute dyspnea with hypoxia/acute systolic CHF -Mild volume overload given some mild pleural effusion on presentation, elevated BNP, she was on 1 to 2 L oxygen, but this has significantly improved with gentle IV diuresis, she is currently on room air, she does appear to be euvolemic, 2D echo was obtained showing EF [id number], with no diastolic dysfunction, patient volume status is very tenuous, as well her blood pressure is very soft, she does appear to be euvolemic today, so no indication for further IV diuresis especially with her soft blood pressure, so she will be discharged home with Lasix on as-needed basis . -CTA chest negative for PE.  Elevated D-dimers -CTA chest negative for pulmonary embolism, venous Dopplers negative for DVT  Small right-sided pleural effusion: No indication for thoracentesis.  Dyspnea improving with diuresis  Lower extremity pain and numbness: -Couplers negative for DVT, it happened while she was walking, will check ABI to rule out claudication.  Near syncope -Daughter reports near syncope over last couple days, lightheadedness, almost passing out, this is most likely in the setting of her soft blood pressure on current medication regimen, 2D echo showing EF 45%, I have lowered her metoprolol dose from 100-25, and her lisinopril from 20 to 5 mg. -MRI brain significant for old CVA, severe vascular dementia,  but no acute stroke. -B12, folate and TSH within normal limit will check echo and monitor on telemetry  Vascular dementia -Continue supportive care -Continue with aspirin and statin  Peripheral vascular disease -ABI significant for severe disease, vascular surgery input greatly appreciated, patient with no pulses, but no evidence of acute ischemia -Continue with aspirin and statin, to follow with vascular surgery as an outpatient  History of brief A. fib/RVRintraoperatively in 2020. Will monitor on telemetry overnight for any arrhythmias. EKG shows sinus rhythm at this time.  Diabetes mellitus:On glimepiride:   Hypokalemia- repleted  Goals of care -Discussed with daughter, overall mother tenuous status, given severe PVD, severe vascular dementia, chronic CHF, deconditioning, and overall is remains poor in the long-term.  Discharge Diagnoses:  Active Problems:   Generalized weakness   Dyspnea    Discharge Instructions  Discharge Instructions    Diet - low sodium heart healthy   Complete by: As directed    Discharge instructions   Complete by: As directed    Follow with Primary MD McLean-Scocuzza, Nino Glow, MD in 7 days   Get CBC, CMP,  checked  by Primary MD next visit.    Activity: As tolerated with Full fall precautions use walker/cane & assistance as needed   Disposition Home    Diet: Heart Healthy /carb modified , with feeding assistance and aspiration precautions.  For Heart failure patients - Check your Weight same time everyday, if you gain over 2 pounds, or you develop in leg swelling, experience more shortness of breath or chest pain, call your Primary MD immediately. Follow Cardiac Low Salt Diet and 1.5 lit/day fluid restriction.   On your next visit with your primary care physician please  Get Medicines reviewed and adjusted.   Please request your Prim.MD to go over all Hospital Tests and Procedure/Radiological results at the follow up, please get  all Hospital records sent to your Prim MD by signing hospital release before you go home.   If you experience worsening of your admission symptoms, develop shortness of breath, life threatening emergency, suicidal or homicidal thoughts you must seek medical attention immediately by calling 911 or calling your MD immediately  if symptoms less severe.  You Must read complete instructions/literature along with all the possible adverse reactions/side effects for all the Medicines you take and that have been prescribed to you. Take any new Medicines after you have completely understood and accpet all the possible adverse reactions/side effects.   Do not drive, operating heavy machinery, perform activities at heights, swimming or participation in water activities or provide baby sitting services if your were admitted for syncope or siezures until you have seen by Primary MD or a Neurologist and advised to do so again.  Do not drive when taking Pain medications.    Do not take more than prescribed Pain, Sleep and Anxiety Medications  Special Instructions: If you have smoked or chewed Tobacco  in the last 2 yrs please stop smoking, stop any regular Alcohol  and or any Recreational drug use.  Wear Seat belts while driving.   Please note  You were cared for by a hospitalist during your hospital stay. If you have any questions about your discharge medications or the care you received while you were in the hospital after you are discharged, you can call the unit and asked to speak with the hospitalist on call if the hospitalist that took care of you is not available. Once you are discharged, your primary care physician will handle any further medical issues. Please note that NO REFILLS for any discharge medications will be authorized once you are discharged, as it is imperative that you return to your primary care physician (or establish a relationship with a primary care physician if you do not have one)  for your aftercare needs so that they can reassess your need for medications and monitor your lab values.   Increase activity slowly   Complete by: As directed      Allergies as of 05/16/2021      Reactions   Metformin And Related    Diarrhea      Medication List    TAKE these medications   aspirin 81 MG chewable tablet Chew 81 mg by mouth daily.   furosemide 20 MG tablet Commonly known as: LASIX Take 1 tablet (20 mg total) by mouth daily as needed.   glimepiride 2 MG tablet Commonly known as: AMARYL Take 1 tablet (2 mg total) by mouth daily with breakfast.   lisinopril 5 MG tablet Commonly known as: ZESTRIL Take 1 tablet (5 mg total) by mouth daily. What changed:   medication strength  how much to take  additional instructions   lovastatin 20 MG tablet Commonly known as: MEVACOR Take 1 tablet (20 mg total) by mouth daily at 6 PM.   metoprolol succinate 25 MG 24 hr tablet Commonly known as: Toprol XL Take 1 tablet (25 mg total) by mouth daily. What changed:   medication strength  how much to take  additional instructions       Follow-up Information    Dew, Erskine Squibb, MD Follow up in 1 week(s).   Specialties: Vascular Surgery, Radiology, Interventional Cardiology Why: Can see Dew  or Arna Medici. New patient. Had ABI's at [facility]. Claudication.  Contact information: [place] [address] [phone]              Allergies  Allergen Reactions  . Metformin And Related     Diarrhea     Consultations:  Vascular surgery   Procedures/Studies: DG Chest 1 View  Result Date: 05/14/2021 CLINICAL DATA:  Shortness of breath for 1 day EXAM: CHEST  1 VIEW COMPARISON:  None. FINDINGS: Cardiac shadow is enlarged. Mild vascular congestion is noted with interstitial edema. Aortic calcifications are noted. No sizable effusion is seen. Postsurgical changes in the right shoulder are noted. IMPRESSION: Changes of mild CHF. Electronically Signed   By:  Inez Catalina M.D.   On: 05/14/2021 16:21   CT Angio Chest PE W and/or Wo Contrast  Result Date: 05/14/2021 CLINICAL DATA:  Weakness. EXAM: CT ANGIOGRAPHY CHEST WITH CONTRAST TECHNIQUE: Multidetector CT imaging of the chest was performed using the standard protocol during bolus administration of intravenous contrast. Multiplanar CT image reconstructions and MIPs were obtained to evaluate the vascular anatomy. CONTRAST:  121mL OMNIPAQUE IOHEXOL 350 MG/ML SOLN COMPARISON:  None. FINDINGS: Cardiovascular: Moderate severity calcification of the aortic arch is seen. Satisfactory opacification of the pulmonary arteries to the segmental level. No evidence of pulmonary embolism. Normal heart size with marked severity coronary artery calcification. No pericardial effusion. Mediastinum/Nodes: No enlarged mediastinal, hilar, or axillary lymph nodes. Thyroid gland, trachea, and esophagus demonstrate no significant findings. Lungs/Pleura: Mild diffuse atelectatic changes are seen throughout both lungs. A mild, patchy mosaic pattern is also seen bilaterally. There is a small right pleural effusion. No pneumothorax identified. Upper Abdomen: No acute abnormality. Musculoskeletal: A right shoulder replacement is noted. Degenerative changes are seen throughout the thoracic spine. Review of the MIP images confirms the above findings. IMPRESSION: 1. No evidence of pulmonary embolism. 2. Mild patchy mosaic pattern with mild bilateral atelectasis. 3. Small right effusion. 4. Right shoulder replacement. Electronically Signed   By: Virgina Norfolk M.D.   On: 05/14/2021 20:29   MR BRAIN WO CONTRAST  Result Date: 05/15/2021 CLINICAL DATA:  Neuro deficit, acute, stroke suspected. Lower extremity numbness/weakness. EXAM: MRI HEAD WITHOUT CONTRAST TECHNIQUE: Multiplanar, multiecho pulse sequences of the brain and surrounding structures were obtained without intravenous contrast. COMPARISON:  No pertinent prior exams available for  comparison. FINDINGS: Brain: Moderate cerebral atrophy.  Comparatively mild cerebellar atrophy. Advanced patchy and confluent T2/FLAIR hyperintensity within the cerebral white matter, nonspecific but compatible with chronic small vessel ischemic disease. Chronic lacunar infarct within the left cerebellar hemisphere. There is no acute infarct. No evidence of intracranial mass. No chronic intracranial blood products. No extra-axial fluid collection. No midline shift. Vascular: Expected proximal arterial flow voids. Skull and upper cervical spine: No focal marrow lesion. Sinuses/Orbits: Visualized orbits show no acute finding. Trace bilateral ethmoid sinus mucosal thickening. Other: Small bilateral mastoid effusions. IMPRESSION: No evidence of acute intracranial abnormality. Severe cerebral white matter chronic small vessel ischemic disease. Chronic left cerebellar lacunar infarct. Moderate cerebral atrophy with comparatively mild cerebellar atrophy. Small bilateral mastoid effusions. Electronically Signed   By: Kellie Simmering DO   On: 05/15/2021 20:24   US Venous Img Lower Bilateral  Result Date: 05/14/2021 CLINICAL DATA:  Bilateral lower extremity swelling. EXAM: BILATERAL LOWER EXTREMITY VENOUS DOPPLER ULTRASOUND TECHNIQUE: Gray-scale sonography with graded compression, as well as color Doppler and duplex ultrasound were performed to evaluate the lower extremity deep venous systems from the level of the common femoral vein  and including the common femoral, femoral, profunda femoral, popliteal and calf veins including the posterior tibial, peroneal and gastrocnemius veins when visible. The superficial great saphenous vein was also interrogated. Spectral Doppler was utilized to evaluate flow at rest and with distal augmentation maneuvers in the common femoral, femoral and popliteal veins. COMPARISON:  08/14/2019, 06/02/2019 FINDINGS: RIGHT LOWER EXTREMITY Common Femoral Vein: No evidence of thrombus. Normal  compressibility, respiratory phasicity and response to augmentation. Saphenofemoral Junction: No evidence of thrombus. Normal compressibility and flow on color Doppler imaging. Profunda Femoral Vein: No evidence of thrombus. Normal compressibility and flow on color Doppler imaging. Femoral Vein: No evidence of thrombus. Normal compressibility, respiratory phasicity and response to augmentation. Popliteal Vein: No evidence of thrombus. Normal compressibility, respiratory phasicity and response to augmentation. Calf Veins: No evidence of thrombus. Normal compressibility and flow on color Doppler imaging. Superficial Great Saphenous Vein: No evidence of thrombus. Normal compressibility. Venous Reflux:  None. Other Findings:  None. LEFT LOWER EXTREMITY Common Femoral Vein: No evidence of thrombus. Normal compressibility, respiratory phasicity and response to augmentation. Saphenofemoral Junction: No evidence of thrombus. Normal compressibility and flow on color Doppler imaging. Profunda Femoral Vein: No evidence of thrombus. Normal compressibility and flow on color Doppler imaging. Femoral Vein: No evidence of thrombus. Normal compressibility, respiratory phasicity and response to augmentation. Popliteal Vein: No evidence of thrombus. Normal compressibility, respiratory phasicity and response to augmentation. Calf Veins: No evidence of thrombus. Normal compressibility and flow on color Doppler imaging. Superficial Great Saphenous Vein: No evidence of thrombus. Normal compressibility. Venous Reflux:  None. Other Findings:  None. IMPRESSION: No evidence of deep venous thrombosis in either lower extremity. Electronically Signed   By: Inez Catalina M.D.   On: 05/14/2021 18:24   US ARTERIAL ABI (SCREENING LOWER EXTREMITY)  Result Date: 05/16/2021 CLINICAL DATA:  Claudication Hypertension Diabetes Rest pain EXAM: NONINVASIVE PHYSIOLOGIC VASCULAR STUDY OF BILATERAL LOWER EXTREMITIES TECHNIQUE: Evaluation of both lower  extremities were performed at rest, including calculation of ankle-brachial indices with single level Doppler, pressure and pulse volume recording. COMPARISON:  None. FINDINGS: Right ABI:  0.38 Left ABI:  0.48 Right Lower Extremity: Posterior tibial and dorsalis pedis waveforms are monophasic Left Lower Extremity: Posterior tibial dorsalis pedis waveforms are monophasic < 0.5 Severe PAD IMPRESSION: Findings consistent with severe bilateral lower extremity arterial occlusive disease. Electronically Signed   By: Miachel Roux M.D.   On: 05/16/2021 07:26   ECHOCARDIOGRAM COMPLETE  Result Date: 05/16/2021    ECHOCARDIOGRAM REPORT   Patient Name:   Heidi Whitaker Date of Exam: 05/15/2021 Medical Rec #:  [id number]           Height:       64.0 in Accession #:    [id number]          Weight:       180.0 lb Date of Birth:  01/18/1929           BSA:          1.871 m Patient Age:    19 years            BP:           125/67 mmHg Patient Gender: F                   HR:           76 bpm. Exam Location:  [facility] Procedure: 2D Echo, Cardiac Doppler and Color Doppler Indications:     R06.03 Acute Respiratory Distress  History:         Patient has no prior history of Echocardiogram examinations.                  Risk Factors:Hypertension and Diabetes. DVT.  Sonographer:     Wilford Sports Rodgers-Jones Referring Phys:  [id number] [place] Diagnosing Phys: Yolonda Kida MD IMPRESSIONS  1. Left ventricular ejection fraction, by estimation, is 45 to 50%. The left ventricle has mildly decreased function. The left ventricle demonstrates regional wall motion abnormalities (see scoring diagram/findings for description). The left ventricular  internal cavity size was mildly dilated. Left ventricular diastolic parameters were normal.  2. Right ventricular systolic function is normal. The right ventricular size is normal.  3. Left atrial size was severely dilated.  4. Right atrial size was mildly dilated.  5. The mitral valve is  abnormal. Mild to moderate mitral valve regurgitation.  6. The aortic valve is calcified. Aortic valve regurgitation is not visualized. Mild to moderate aortic valve sclerosis/calcification is present, without any evidence of aortic stenosis. FINDINGS  Left Ventricle: Septal Ant/apical Lat hypo. Left ventricular ejection fraction, by estimation, is 45 to 50%. The left ventricle has mildly decreased function. The left ventricle demonstrates regional wall motion abnormalities. The left ventricular internal cavity size was mildly dilated. There is borderline asymmetric left ventricular hypertrophy of the septal segment. Left ventricular diastolic parameters were normal. Right Ventricle: The right ventricular size is normal. No increase in right ventricular wall thickness. Right ventricular systolic function is normal. Left Atrium: Left atrial size was severely dilated. Right Atrium: Right atrial size was mildly dilated. Pericardium: There is no evidence of pericardial effusion. Mitral Valve: The mitral valve is abnormal. There is moderate thickening of the mitral valve leaflet(s). There is moderate calcification of the mitral valve leaflet(s). Normal mobility of the mitral valve leaflets. Mild mitral annular calcification. Mild  to moderate mitral valve regurgitation. Tricuspid Valve: The tricuspid valve is normal in structure. Tricuspid valve regurgitation is trivial. Aortic Valve: The aortic valve is calcified. Aortic valve regurgitation is not visualized. Mild to moderate aortic valve sclerosis/calcification is present, without any evidence of aortic stenosis. Pulmonic Valve: The pulmonic valve was normal in structure. Pulmonic valve regurgitation is not visualized. Aorta: The ascending aorta was not well visualized. IAS/Shunts: No atrial level shunt detected by color flow Doppler.  LEFT VENTRICLE PLAX 2D LVIDd:         3.64 cm  Diastology LVIDs:         2.61 cm  LV e' medial:    4.05 cm/s LV PW:         1.24 cm  LV  E/e' medial:  23.1 LV IVS:        1.27 cm  LV e' lateral:   5.03 cm/s LVOT diam:     2.00 cm  LV E/e' lateral: 18.6 LV SV:         66 LV SV Index:   35 LVOT Area:     3.14 cm  RIGHT VENTRICLE RV Basal diam:  2.84 cm RV S prime:     14.30 cm/s TAPSE (M-mode): 2.3 cm LEFT ATRIUM             Index       RIGHT ATRIUM           Index LA diam:        4.10 cm 2.19 cm/m  RA Area:     12.70 cm LA Vol (A2C):   71.2 ml  38.06 ml/m RA Volume:   31.90 ml  17.05 ml/m LA Vol (A4C):   61.9 ml 33.09 ml/m LA Biplane Vol: 66.8 ml 35.71 ml/m  AORTIC VALVE LVOT Vmax:   121.00 cm/s LVOT Vmean:  80.400 cm/s LVOT VTI:    0.211 m  AORTA Ao Root diam: 3.40 cm Ao Asc diam:  3.40 cm MITRAL VALVE MV Area (PHT): 2.60 cm    SHUNTS MV Decel Time: 292 msec    Systemic VTI:  0.21 m MV E velocity: 93.50 cm/s  Systemic Diam: 2.00 cm MV A velocity: 91.30 cm/s MV E/A ratio:  1.02 Dwayne D Callwood MD Electronically signed by Yolonda Kida MD Signature Date/Time: 05/16/2021/12:33:27 PM    Final       Subjective:  Patient denies any complaints this morning, asking when she can go home.  Discharge Exam: Vitals:   05/16/21 0801 05/16/21 1113  BP: (!) 104/59 (!) 95/59  Pulse: 71 68  Resp: 15 15  Temp: 98 F (36.7 C) 98.3 F (36.8 C)  SpO2: 93% (!) 89%   Vitals:   05/16/21 0411 05/16/21 0423 05/16/21 0801 05/16/21 1113  BP: 102/63  (!) 104/59 (!) 95/59  Pulse: 78 76 71 68  Resp: 20  15 15   Temp: 98 F (36.7 C)  98 F (36.7 C) 98.3 F (36.8 C)  TempSrc: Oral   Oral  SpO2: 90% 93% 93% (!) 89%  Weight:        General: Patient is awake, alert, pleasant, frail, deconditioned, easily distracted Cardiovascular: RRR, S1/S2 +, no rubs, no gallops Respiratory: CTA bilaterally, no wheezing, no rhonchi Abdominal: Soft, NT, ND, bowel sounds + Extremities: no edema, no cyanosis, significantly diminished pulses    The results of significant diagnostics from this hospitalization (including imaging, microbiology, ancillary  and laboratory) are listed below for reference.     Microbiology: Recent Results (from the past 240 hour(s))  Resp Panel by RT-PCR (Flu A&B, Covid) Nasopharyngeal Swab     Status: None   Collection Time: 05/14/21  4:33 PM   Specimen: Nasopharyngeal Swab; Nasopharyngeal(NP) swabs in vial transport medium  Result Value Ref Range Status   SARS Coronavirus 2 by RT PCR NEGATIVE NEGATIVE Final    Comment: (NOTE) SARS-CoV-2 target nucleic acids are NOT DETECTED.  The SARS-CoV-2 RNA is generally detectable in upper respiratory specimens during the acute phase of infection. The lowest concentration of SARS-CoV-2 viral copies this assay can detect is 138 copies/mL. A negative result does not preclude SARS-Cov-2 infection and should not be used as the sole basis for treatment or other patient management decisions. A negative result may occur with  improper specimen collection/handling, submission of specimen other than nasopharyngeal swab, presence of viral mutation(s) within the areas targeted by this assay, and inadequate number of viral copies(<138 copies/mL). A negative result must be combined with clinical observations, patient history, and epidemiological information. The expected result is Negative.  Fact Sheet for Patients:  EntrepreneurPulse.com.au  Fact Sheet for Healthcare Providers:  IncredibleEmployment.be  This test is no t yet approved or cleared by the [place] FDA and  has been authorized for detection and/or diagnosis of SARS-CoV-2 by FDA under an Emergency Use Authorization (EUA). This EUA will remain  in effect (meaning this test can be used) for the duration of the COVID-19 declaration under Section 564(b)(1) of the Act, 21 U.S.C.section 360bbb-3(b)(1), unless the authorization is terminated  or revoked sooner.       Influenza A by PCR NEGATIVE NEGATIVE  Final   Influenza B by PCR NEGATIVE NEGATIVE Final    Comment:  (NOTE) The Xpert Xpress SARS-CoV-2/FLU/RSV plus assay is intended as an aid in the diagnosis of influenza from Nasopharyngeal swab specimens and should not be used as a sole basis for treatment. Nasal washings and aspirates are unacceptable for Xpert Xpress SARS-CoV-2/FLU/RSV testing.  Fact Sheet for Patients: EntrepreneurPulse.com.au  Fact Sheet for Healthcare Providers: IncredibleEmployment.be  This test is not yet approved or cleared by the [place] FDA and has been authorized for detection and/or diagnosis of SARS-CoV-2 by FDA under an Emergency Use Authorization (EUA). This EUA will remain in effect (meaning this test can be used) for the duration of the COVID-19 declaration under Section 564(b)(1) of the Act, 21 U.S.C. section 360bbb-3(b)(1), unless the authorization is terminated or revoked.  Performed at [facility], [address] [place], [place] [address]      Labs: BNP (last 3 results) Recent Labs    05/14/21 1531  BNP [id number]*   Basic Metabolic Panel: Recent Labs  Lab 05/14/21 1531 05/15/21 0504 05/16/21 0633  NA 136 139 138  K 4.6 3.9 3.4*  CL 104 106 104  CO2 23 22 26   GLUCOSE 114* 133* 125*  BUN 27* 23 30*  CREATININE 0.99 1.04* 1.10*  CALCIUM 9.3 9.3 9.4  MG 2.3  --   --    Liver Function Tests: No results for input(s): AST, ALT, ALKPHOS, BILITOT, PROT, ALBUMIN in the last 168 hours. No results for input(s): LIPASE, AMYLASE in the last 168 hours. No results for input(s): AMMONIA in the last 168 hours. CBC: Recent Labs  Lab 05/14/21 1531 05/15/21 0504 05/16/21 0633  WBC 9.2 11.9* 11.3*  HGB 12.2 13.3 12.9  HCT 37.4 40.5 38.3  MCV 91.2 90.6 90.1  PLT 231 239 231   Cardiac Enzymes: Recent Labs  Lab 05/14/21 1531  CKTOTAL 61   BNP: Invalid input(s): POCBNP CBG: Recent Labs  Lab 05/15/21 0759 05/15/21 1152 05/15/21 2055 05/16/21 0758 05/16/21 1114  GLUCAP 100* 174* 134* 116*  128*   D-Dimer Recent Labs    05/14/21 1847  DDIMER 3.26*   Hgb A1c Recent Labs    05/14/21 2321  HGBA1C 7.0*   Lipid Profile No results for input(s): CHOL, HDL, LDLCALC, TRIG, CHOLHDL, LDLDIRECT in the last 72 hours. Thyroid function studies Recent Labs    05/14/21 2321  TSH 3.788   Anemia work up Recent Labs    05/15/21 1302 05/16/21 0633  VITAMINB12 512  --   FOLATE  --  16.8   Urinalysis    Component Value Date/Time   COLORURINE STRAW (A) 05/14/2021 1806   APPEARANCEUR CLEAR (A) 05/14/2021 1806   LABSPEC 1.004 (L) 05/14/2021 1806   PHURINE 7.0 05/14/2021 1806   GLUCOSEU NEGATIVE 05/14/2021 1806   HGBUR NEGATIVE 05/14/2021 1806   BILIRUBINUR NEGATIVE 05/14/2021 1806   KETONESUR NEGATIVE 05/14/2021 1806   PROTEINUR NEGATIVE 05/14/2021 1806   NITRITE NEGATIVE 05/14/2021 1806   LEUKOCYTESUR SMALL (A) 05/14/2021 1806   Sepsis Labs Invalid input(s): PROCALCITONIN,  WBC,  LACTICIDVEN Microbiology Recent Results (from the past 240 hour(s))  Resp Panel by RT-PCR (Flu A&B, Covid) Nasopharyngeal Swab     Status: None   Collection Time: 05/14/21  4:33 PM   Specimen: Nasopharyngeal Swab; Nasopharyngeal(NP) swabs in vial transport medium  Result Value Ref Range Status   SARS Coronavirus 2 by RT PCR NEGATIVE NEGATIVE Final    Comment: (NOTE) SARS-CoV-2 target nucleic acids are  NOT DETECTED.  The SARS-CoV-2 RNA is generally detectable in upper respiratory specimens during the acute phase of infection. The lowest concentration of SARS-CoV-2 viral copies this assay can detect is 138 copies/mL. A negative result does not preclude SARS-Cov-2 infection and should not be used as the sole basis for treatment or other patient management decisions. A negative result may occur with  improper specimen collection/handling, submission of specimen other than nasopharyngeal swab, presence of viral mutation(s) within the areas targeted by this assay, and inadequate number of  viral copies(<138 copies/mL). A negative result must be combined with clinical observations, patient history, and epidemiological information. The expected result is Negative.  Fact Sheet for Patients:  EntrepreneurPulse.com.au  Fact Sheet for Healthcare Providers:  IncredibleEmployment.be  This test is no t yet approved or cleared by the [place] FDA and  has been authorized for detection and/or diagnosis of SARS-CoV-2 by FDA under an Emergency Use Authorization (EUA). This EUA will remain  in effect (meaning this test can be used) for the duration of the COVID-19 declaration under Section 564(b)(1) of the Act, 21 U.S.C.section 360bbb-3(b)(1), unless the authorization is terminated  or revoked sooner.       Influenza A by PCR NEGATIVE NEGATIVE Final   Influenza B by PCR NEGATIVE NEGATIVE Final    Comment: (NOTE) The Xpert Xpress SARS-CoV-2/FLU/RSV plus assay is intended as an aid in the diagnosis of influenza from Nasopharyngeal swab specimens and should not be used as a sole basis for treatment. Nasal washings and aspirates are unacceptable for Xpert Xpress SARS-CoV-2/FLU/RSV testing.  Fact Sheet for Patients: EntrepreneurPulse.com.au  Fact Sheet for Healthcare Providers: IncredibleEmployment.be  This test is not yet approved or cleared by the [place] FDA and has been authorized for detection and/or diagnosis of SARS-CoV-2 by FDA under an Emergency Use Authorization (EUA). This EUA will remain in effect (meaning this test can be used) for the duration of the COVID-19 declaration under Section 564(b)(1) of the Act, 21 U.S.C. section 360bbb-3(b)(1), unless the authorization is terminated or revoked.  Performed at [facility], [address], [place], [place] [address]      Time coordinating discharge: Over 30 minutes  SIGNED:   Phillips Climes, MD  [facility] 05/16/2021, 2:10 PM Pager   If 7PM-7AM, please contact night-coverage [email or web address] Password [email or web address]

## 2021-05-16 NOTE — Telephone Encounter (Signed)
[name] called in need some help with the medical chart of her mother

## 2021-05-16 NOTE — Consult Note (Signed)
[place] Nurse ostomy consult note Stoma type/location: LLQ Colostomy  Stomal assessment/size: 1 and 5/8 inches round, flush, oval at rest with large parastomal hernia. Lumen at center Peristomal assessment: Intact Treatment options for stomal/peristomal skin: Skin barrier ring Output: several round, brown,soft stool balls in pouch Ostomy pouching: 1pc.compressible convex pouch with skin barrier ring  Education provided: None today. Patient is preparing for discharge Enrolled patient in [place] program: Yes, previously  [place] nursing team will not follow, but will remain available to this patient, the nursing and medical teams.  Please re-consult if needed. Thanks, Maudie Flakes, MSN, RN, [place], Arther Abbott  Pager# [phone]

## 2021-05-16 NOTE — Discharge Instructions (Signed)
Follow with Primary MD McLean-Scocuzza, Nino Glow, MD in 7 days   Get CBC, CMP,  checked  by Primary MD next visit.    Activity: As tolerated with Full fall precautions use walker/cane & assistance as needed   Disposition Home    Diet: Heart Healthy /carb modified , with feeding assistance and aspiration precautions.  For Heart failure patients - Check your Weight same time everyday, if you gain over 2 pounds, or you develop in leg swelling, experience more shortness of breath or chest pain, call your Primary MD immediately. Follow Cardiac Low Salt Diet and 1.5 lit/day fluid restriction.   On your next visit with your primary care physician please Get Medicines reviewed and adjusted.   Please request your Prim.MD to go over all Hospital Tests and Procedure/Radiological results at the follow up, please get all Hospital records sent to your Prim MD by signing hospital release before you go home.   If you experience worsening of your admission symptoms, develop shortness of breath, life threatening emergency, suicidal or homicidal thoughts you must seek medical attention immediately by calling 911 or calling your MD immediately  if symptoms less severe.  You Must read complete instructions/literature along with all the possible adverse reactions/side effects for all the Medicines you take and that have been prescribed to you. Take any new Medicines after you have completely understood and accpet all the possible adverse reactions/side effects.   Do not drive, operating heavy machinery, perform activities at heights, swimming or participation in water activities or provide baby sitting services if your were admitted for syncope or siezures until you have seen by Primary MD or a Neurologist and advised to do so again.  Do not drive when taking Pain medications.    Do not take more than prescribed Pain, Sleep and Anxiety Medications  Special Instructions: If you have smoked or chewed Tobacco   in the last 2 yrs please stop smoking, stop any regular Alcohol  and or any Recreational drug use.  Wear Seat belts while driving.   Please note  You were cared for by a hospitalist during your hospital stay. If you have any questions about your discharge medications or the care you received while you were in the hospital after you are discharged, you can call the unit and asked to speak with the hospitalist on call if the hospitalist that took care of you is not available. Once you are discharged, your primary care physician will handle any further medical issues. Please note that NO REFILLS for any discharge medications will be authorized once you are discharged, as it is imperative that you return to your primary care physician (or establish a relationship with a primary care physician if you do not have one) for your aftercare needs so that they can reassess your need for medications and monitor your lab values.

## 2021-05-16 NOTE — Consult Note (Signed)
[facility] Consult Note  MRN : [id number]  Heidi Whitaker is a 87 y.o. ([birth year]) female who presents with chief complaint of  Chief Complaint  Patient presents with  . Weakness   History of Present Illness:  Heidi Whitaker is a 87 year old with medical history significant for diabetes mellitus, hypertension, status post diverting colostomy in 2018 and DVT.  Patient initially presented with concerns for some soreness and numbness in both feet for few days duration.  She also complains of having some hot flashes and being out of her baseline.  Daughter was concerned decided to bring patient to the emergency room to be evaluated.  At time of evaluation, patient denied any dysuria or increased frequency micturition.  Denies any fever or chills.  Denies nausea or vomiting..  Denied any headache. She also denied any shortness of breath but admits to easy fatigability which she attributes to her age.  She denies chest pain or palpitations.  Patient was being planned for possible discharge however upon ambulation, patient desaturated.  This was different from her baseline.  In the ED, patient was extensively worked up, D-dimer came back elevated.  CT angiogram was subsequently done ruled out PE.  CT scan did show some evidence of atelectasis.  At baseline, patient is on as needed Lasix.  BNP was mildly elevated. Patient was referred to hospitalist service for admission due to concern for possible CHF.  Patient has no prior known prior history of CHF.  She has no echocardiogram on file.  05/15/21: Right ABI:  0.38 Left ABI:  0.48 Right Lower Extremity: Posterior tibial and dorsalis pedis waveforms are monophasic Left Lower Extremity: Posterior tibial dorsalis pedis waveforms are Monophasic  Vascular surgery was consulted by Dr. Waldron Labs in the setting of bilateral lower extremity discomfort with decreased ABIs.  Current Facility-Administered Medications   Medication Dose Route Frequency Provider Last Rate Last Admin  . acetaminophen (TYLENOL) tablet 650 mg  650 mg Oral Q6H PRN Artist Beach, MD   650 mg at 05/15/21 4580   Or  . acetaminophen (TYLENOL) suppository 650 mg  650 mg Rectal Q6H PRN Acheampong, Warnell Bureau, MD      . albuterol (PROVENTIL) (2.5 MG/3ML) 0.083% nebulizer solution 2.5 mg  2.5 mg Nebulization Q2H PRN Acheampong, Warnell Bureau, MD      . aspirin chewable tablet 81 mg  81 mg Oral Daily Acheampong, Warnell Bureau, MD   81 mg at 05/16/21 0935  . cyclobenzaprine (FLEXERIL) tablet 5 mg  5 mg Oral TID PRN Mansy, Jan A, MD   5 mg at 05/15/21 1126  . enoxaparin (LOVENOX) injection 40 mg  40 mg Subcutaneous Q24H Acheampong, Warnell Bureau, MD   40 mg at 05/15/21 2115  . furosemide (LASIX) injection 20 mg  20 mg Intravenous Daily Acheampong, Warnell Bureau, MD   20 mg at 05/15/21 9983  . hydrALAZINE (APRESOLINE) tablet 25 mg  25 mg Oral Q6H PRN Artist Beach, MD      . insulin aspart (novoLOG) injection 0-6 Units  0-6 Units Subcutaneous TID WC Acheampong, Warnell Bureau, MD   1 Units at 05/15/21 1157  . lisinopril (ZESTRIL) tablet 20 mg  20 mg Oral Daily Acheampong, Warnell Bureau, MD   20 mg at 05/14/21 2320  . metoprolol succinate (TOPROL-XL) 24 hr tablet 100 mg  100 mg Oral Daily Acheampong, Warnell Bureau, MD      . ondansetron [facility]) injection 4 mg  4 mg Intravenous Q4H  PRN Mansy, Jan A, MD      . pravastatin (PRAVACHOL) tablet 20 mg  20 mg Oral q1800 Artist Beach, MD   20 mg at 05/15/21 1813   Past Medical History:  Diagnosis Date  . Diabetes mellitus without complication ([place])   . DVT (deep venous thrombosis) ([place])    06/02/2019  . Hard of hearing   . Hypertension    Past Surgical History:  Procedure Laterality Date  . CESAREAN SECTION    . COLONOSCOPY N/A 05/17/2019   Procedure: FLEXIBLE SIGMOIDOSCOPY;  Surgeon: Ileana Roup, MD;  Location: [facility] ORS;  Service: General;  Laterality: N/A;  . COLONOSCOPY WITH PROPOFOL N/A 01/09/2019   Procedure:  COLONOSCOPY WITH PROPOFOL;  Surgeon: Lin Landsman, MD;  Location[facility] ENDOSCOPY;  Service: Gastroenterology;  Laterality: N/A;  . ESOPHAGOGASTRODUODENOSCOPY (EGD) WITH PROPOFOL N/A 01/09/2019   Procedure: ESOPHAGOGASTRODUODENOSCOPY (EGD) WITH PROPOFOL;  Surgeon: Lin Landsman, MD;  Location: [facility] ENDOSCOPY;  Service: Gastroenterology;  Laterality: N/A;  . KNEE SURGERY     left 2017/2018  . SHOULDER SURGERY     right  . XI ROBOTIC ASSISTED LOWER ANTERIOR RESECTION N/A 05/17/2019   Procedure: XI ROBOTIC ASSISTED LOWER ANTERIOR RESECTION WITH END COLOSTOMY;  Surgeon: Ileana Roup, MD;  Location: [facility] ORS;  Service: General;  Laterality: N/A;   Social History Social History   Tobacco Use  . Smoking status: Former Smoker    Years: 4.00  . Smokeless tobacco: Never Used  . Tobacco comment: 3 per day  Vaping Use  . Vaping Use: Never used  Substance Use Topics  . Alcohol use: No  . Drug use: No   Family History Family History  Problem Relation Age of Onset  . Addison's disease Maternal Aunt   . Heart disease Mother   . Heart disease Father   Denies family history of peripheral artery disease, VCs or renal disease.  Allergies  Allergen Reactions  . Metformin And Related     Diarrhea    REVIEW OF SYSTEMS (Negative unless checked)  Constitutional: [] Weight loss  [] Fever  [] Chills Cardiac: [] Chest pain   [] Chest pressure   [] Palpitations   [] Shortness of breath when laying flat   [] Shortness of breath at rest   [] Shortness of breath with exertion. Vascular:  [x] Pain in legs with walking   [] Pain in legs at rest   [] Pain in legs when laying flat   [x] Claudication   [] Pain in feet when walking  [] Pain in feet at rest  [] Pain in feet when laying flat   [] History of DVT   [] Phlebitis   [] Swelling in legs   [] Varicose veins   [] Non-healing ulcers Pulmonary:   [] Uses home oxygen   [] Productive cough   [] Hemoptysis   [] Wheeze  [] COPD   [] Asthma Neurologic:  [] Dizziness   [] Blackouts   [] Seizures   [] History of stroke   [] History of TIA  [] Aphasia   [] Temporary blindness   [] Dysphagia   [] Weakness or numbness in arms   [] Weakness or numbness in legs Musculoskeletal:  [x] Arthritis   [] Joint swelling   [] Joint pain   [] Low back pain Hematologic:  [] Easy bruising  [] Easy bleeding   [] Hypercoagulable state   [] Anemic  [] Hepatitis Gastrointestinal:  [] Blood in stool   [] Vomiting blood  [] Gastroesophageal reflux/heartburn   [] Difficulty swallowing. Genitourinary:  [] Chronic kidney disease   [] Difficult urination  [] Frequent urination  [] Burning with urination   [] Blood in urine Skin:  [] Rashes   [] Ulcers   [] Wounds Psychological:  []   History of anxiety   []  History of major depression.  Physical Examination  Vitals:   05/16/21 0411 05/16/21 0423 05/16/21 0801 05/16/21 1113  BP: 102/63  (!) 104/59 (!) 95/59  Pulse: 78 76 71 68  Resp: 20  15 15   Temp: 98 F (36.7 C)  98 F (36.7 C) 98.3 F (36.8 C)  TempSrc: Oral   Oral  SpO2: 90% 93% 93% (!) 89%  Weight:       Body mass index is 30.9 kg/m. Gen:  WD/WN, NAD Head: [place]/AT, No temporalis wasting. Prominent temp pulse not noted. Ear/Nose/Throat: Hearing grossly intact, nares w/o erythema or drainage, oropharynx w/o Erythema/Exudate Eyes: Sclera non-icteric, conjunctiva clear Neck: Trachea midline.  No JVD.  Pulmonary:  Good air movement, respirations not labored, equal bilaterally.  Cardiac: RRR, normal S1, S2. Vascular:  Vessel Right Left  Radial Palpable Palpable  Ulnar Palpable Palpable  Brachial Palpable Palpable  Carotid Palpable, without bruit Palpable, without bruit  Aorta Not palpable N/A  Femoral Palpable Palpable  Popliteal Palpable Palpable  PT Non-Palpable Non-Palpable  DP Non-Palpable Non-Palpable   Gastrointestinal: soft, non-tender/non-distended. No guarding/reflex.  Musculoskeletal: M/S 5/5 throughout.  Extremities without ischemic changes.  No deformity or atrophy. No  edema. Neurologic: Sensation grossly intact in extremities.  Symmetrical.  Speech is fluent. Motor exam as listed above. Psychiatric: Judgment intact, Mood & affect appropriate for pt's clinical situation. Dermatologic: No rashes or ulcers noted.  No cellulitis or open wounds. Lymph : No Cervical, Axillary, or Inguinal lymphadenopathy.  CBC Lab Results  Component Value Date   WBC 11.3 (H) 05/16/2021   HGB 12.9 05/16/2021   HCT 38.3 05/16/2021   MCV 90.1 05/16/2021   PLT 231 05/16/2021   BMET    Component Value Date/Time   NA 138 05/16/2021 0633   K 3.4 (L) 05/16/2021 0633   CL 104 05/16/2021 0633   CO2 26 05/16/2021 0633   GLUCOSE 125 (H) 05/16/2021 0633   BUN 30 (H) 05/16/2021 0633   CREATININE 1.10 (H) 05/16/2021 0633   CALCIUM 9.4 05/16/2021 0633   GFRNONAA 48 (L) 05/16/2021 0633   GFRAA >60 06/02/2019 2009   Estimated Creatinine Clearance: 35.9 mL/min (A) (by C-G formula based on SCr of 1.1 mg/dL (H)).  COAG Lab Results  Component Value Date   INR 1.0 05/11/2019   Radiology DG Chest 1 View  Result Date: 05/14/2021 CLINICAL DATA:  Shortness of breath for 1 day EXAM: CHEST  1 VIEW COMPARISON:  None. FINDINGS: Cardiac shadow is enlarged. Mild vascular congestion is noted with interstitial edema. Aortic calcifications are noted. No sizable effusion is seen. Postsurgical changes in the right shoulder are noted. IMPRESSION: Changes of mild CHF. Electronically Signed   By: Inez Catalina M.D.   On: 05/14/2021 16:21   CT Angio Chest PE W and/or Wo Contrast  Result Date: 05/14/2021 CLINICAL DATA:  Weakness. EXAM: CT ANGIOGRAPHY CHEST WITH CONTRAST TECHNIQUE: Multidetector CT imaging of the chest was performed using the standard protocol during bolus administration of intravenous contrast. Multiplanar CT image reconstructions and MIPs were obtained to evaluate the vascular anatomy. CONTRAST:  137m OMNIPAQUE IOHEXOL 350 MG/ML SOLN COMPARISON:  None. FINDINGS: Cardiovascular:  Moderate severity calcification of the aortic arch is seen. Satisfactory opacification of the pulmonary arteries to the segmental level. No evidence of pulmonary embolism. Normal heart size with marked severity coronary artery calcification. No pericardial effusion. Mediastinum/Nodes: No enlarged mediastinal, hilar, or axillary lymph nodes. Thyroid gland, trachea, and esophagus demonstrate no significant findings.  Lungs/Pleura: Mild diffuse atelectatic changes are seen throughout both lungs. A mild, patchy mosaic pattern is also seen bilaterally. There is a small right pleural effusion. No pneumothorax identified. Upper Abdomen: No acute abnormality. Musculoskeletal: A right shoulder replacement is noted. Degenerative changes are seen throughout the thoracic spine. Review of the MIP images confirms the above findings. IMPRESSION: 1. No evidence of pulmonary embolism. 2. Mild patchy mosaic pattern with mild bilateral atelectasis. 3. Small right effusion. 4. Right shoulder replacement. Electronically Signed   By: Virgina Norfolk M.D.   On: 05/14/2021 20:29   MR BRAIN WO CONTRAST  Result Date: 05/15/2021 CLINICAL DATA:  Neuro deficit, acute, stroke suspected. Lower extremity numbness/weakness. EXAM: MRI HEAD WITHOUT CONTRAST TECHNIQUE: Multiplanar, multiecho pulse sequences of the brain and surrounding structures were obtained without intravenous contrast. COMPARISON:  No pertinent prior exams available for comparison. FINDINGS: Brain: Moderate cerebral atrophy.  Comparatively mild cerebellar atrophy. Advanced patchy and confluent T2/FLAIR hyperintensity within the cerebral white matter, nonspecific but compatible with chronic small vessel ischemic disease. Chronic lacunar infarct within the left cerebellar hemisphere. There is no acute infarct. No evidence of intracranial mass. No chronic intracranial blood products. No extra-axial fluid collection. No midline shift. Vascular: Expected proximal arterial flow  voids. Skull and upper cervical spine: No focal marrow lesion. Sinuses/Orbits: Visualized orbits show no acute finding. Trace bilateral ethmoid sinus mucosal thickening. Other: Small bilateral mastoid effusions. IMPRESSION: No evidence of acute intracranial abnormality. Severe cerebral white matter chronic small vessel ischemic disease. Chronic left cerebellar lacunar infarct. Moderate cerebral atrophy with comparatively mild cerebellar atrophy. Small bilateral mastoid effusions. Electronically Signed   By: Kellie Simmering DO   On: 05/15/2021 20:24   US Venous Img Lower Bilateral  Result Date: 05/14/2021 CLINICAL DATA:  Bilateral lower extremity swelling. EXAM: BILATERAL LOWER EXTREMITY VENOUS DOPPLER ULTRASOUND TECHNIQUE: Gray-scale sonography with graded compression, as well as color Doppler and duplex ultrasound were performed to evaluate the lower extremity deep venous systems from the level of the common femoral vein and including the common femoral, femoral, profunda femoral, popliteal and calf veins including the posterior tibial, peroneal and gastrocnemius veins when visible. The superficial great saphenous vein was also interrogated. Spectral Doppler was utilized to evaluate flow at rest and with distal augmentation maneuvers in the common femoral, femoral and popliteal veins. COMPARISON:  08/14/2019, 06/02/2019 FINDINGS: RIGHT LOWER EXTREMITY Common Femoral Vein: No evidence of thrombus. Normal compressibility, respiratory phasicity and response to augmentation. Saphenofemoral Junction: No evidence of thrombus. Normal compressibility and flow on color Doppler imaging. Profunda Femoral Vein: No evidence of thrombus. Normal compressibility and flow on color Doppler imaging. Femoral Vein: No evidence of thrombus. Normal compressibility, respiratory phasicity and response to augmentation. Popliteal Vein: No evidence of thrombus. Normal compressibility, respiratory phasicity and response to augmentation. Calf  Veins: No evidence of thrombus. Normal compressibility and flow on color Doppler imaging. Superficial Great Saphenous Vein: No evidence of thrombus. Normal compressibility. Venous Reflux:  None. Other Findings:  None. LEFT LOWER EXTREMITY Common Femoral Vein: No evidence of thrombus. Normal compressibility, respiratory phasicity and response to augmentation. Saphenofemoral Junction: No evidence of thrombus. Normal compressibility and flow on color Doppler imaging. Profunda Femoral Vein: No evidence of thrombus. Normal compressibility and flow on color Doppler imaging. Femoral Vein: No evidence of thrombus. Normal compressibility, respiratory phasicity and response to augmentation. Popliteal Vein: No evidence of thrombus. Normal compressibility, respiratory phasicity and response to augmentation. Calf Veins: No evidence of thrombus. Normal compressibility and flow on color Doppler imaging. Superficial  Great Saphenous Vein: No evidence of thrombus. Normal compressibility. Venous Reflux:  None. Other Findings:  None. IMPRESSION: No evidence of deep venous thrombosis in either lower extremity. Electronically Signed   By: Inez Catalina M.D.   On: 05/14/2021 18:24   US ARTERIAL ABI (SCREENING LOWER EXTREMITY)  Result Date: 05/16/2021 CLINICAL DATA:  Claudication Hypertension Diabetes Rest pain EXAM: NONINVASIVE PHYSIOLOGIC VASCULAR STUDY OF BILATERAL LOWER EXTREMITIES TECHNIQUE: Evaluation of both lower extremities were performed at rest, including calculation of ankle-brachial indices with single level Doppler, pressure and pulse volume recording. COMPARISON:  None. FINDINGS: Right ABI:  0.38 Left ABI:  0.48 Right Lower Extremity: Posterior tibial and dorsalis pedis waveforms are monophasic Left Lower Extremity: Posterior tibial dorsalis pedis waveforms are monophasic < 0.5 Severe PAD IMPRESSION: Findings consistent with severe bilateral lower extremity arterial occlusive disease. Electronically Signed   By: Miachel Roux M.D.   On: 05/16/2021 07:26   Assessment/Plan Darcie Mellone is a 87 year old with medical history significant for diabetes mellitus, hypertension, status post diverting colostomy in 2018 and DVT.  Patient initially presented with concerns for some soreness and numbness in both feet for few days duration.  1.  Possible Peripheral Artery Disease: Patient presented to the [facility] Emergency Department with a chief complaint of discomfort to the bilateral lower extremity. As per the patient and her family the right lower extremity discomfort worse than left. No open sores / slow healing wounds. Patient's discomfort has improved today. Patient was resting comfortably in bed doing a crossword puzzle this AM when I first met her. As per the daughter, the patient's pain did increase significantly approximately mid week which prompted her to seek medical attention.  Unable to palpate pedal pulses however there is no acute vascular compromise noted to the bilateral lower extremity at this time. With the lack of acute symptoms and today's physical exam there are no emergent vascular recommendations however recommend the patient follow-up in the outpatient setting to further discuss the pathophysiology of peripheral artery disease as well as talk of treatment regarding an angiogram. The patient's daughter as well as the patient are in agreement.  2. Diabetes: On appropriate medications. Encouraged good control as its slows the progression of atherosclerotic disease.  3. Hyperlipidemia: On aspirin and statin. Encouraged good control as its slows the progression of atherosclerotic disease.  Discussed in detail with Dr. Mayme Genta, PA-C  05/16/2021 12:16 PM  This note was created with Dragon medical transcription system.  Any error is purely unintentional.

## 2021-05-19 ENCOUNTER — Other Ambulatory Visit (INDEPENDENT_AMBULATORY_CARE_PROVIDER_SITE_OTHER): Payer: Self-pay | Admitting: Nurse Practitioner

## 2021-05-19 ENCOUNTER — Telehealth (INDEPENDENT_AMBULATORY_CARE_PROVIDER_SITE_OTHER): Payer: Self-pay | Admitting: Vascular Surgery

## 2021-05-19 DIAGNOSIS — M79604 Pain in right leg: Secondary | ICD-10-CM

## 2021-05-19 DIAGNOSIS — M79605 Pain in left leg: Secondary | ICD-10-CM

## 2021-05-19 NOTE — Telephone Encounter (Signed)
Sound like if the pain gets any worse she should go to the ED to be Evaluated . Please advise.

## 2021-05-19 NOTE — Telephone Encounter (Signed)
Appt made for tomorrow with Heidi Whitaker @ 9:00. Daughter is concerned about pain level. When standing/walking - pain is 8/10 according to daughter. Please advise daughter of what she can do for. Heat tylenol etc. Please call [phone].

## 2021-05-19 NOTE — Telephone Encounter (Signed)
Patient scheduled for 5/24 at 2pm

## 2021-05-19 NOTE — Telephone Encounter (Signed)
She can utilize Tylenol and apply heat to the areas that hurt.  Based on the notes that I saw from the hospital it is not entirely certain that this pain is directly related to any vascular abnormalities.  I also noted that this patient does not have ABIs scheduled.  She should come in with ABIs as the concern is for decreased pulses and recent studies have ruled out DVTs.

## 2021-05-19 NOTE — Telephone Encounter (Signed)
[name] called. No one called her last Friday. She is concerned because patient was admitted to hospital and out on Friday. Hospital changed her bp medication down, both bp medications were lowered. Daughter can not get medication refilled either. Appointment has been made 10am on 05/21/2021.

## 2021-05-19 NOTE — Telephone Encounter (Signed)
Please schedule the pt for ABI and eval with the NP

## 2021-05-19 NOTE — Telephone Encounter (Signed)
Appointment time changed to 8am on 05/21/2021.

## 2021-05-20 ENCOUNTER — Encounter (INDEPENDENT_AMBULATORY_CARE_PROVIDER_SITE_OTHER): Payer: Self-pay

## 2021-05-20 ENCOUNTER — Encounter (INDEPENDENT_AMBULATORY_CARE_PROVIDER_SITE_OTHER): Payer: Self-pay | Admitting: Vascular Surgery

## 2021-05-20 ENCOUNTER — Ambulatory Visit (INDEPENDENT_AMBULATORY_CARE_PROVIDER_SITE_OTHER): Payer: Self-pay | Admitting: Nurse Practitioner

## 2021-05-20 ENCOUNTER — Encounter (INDEPENDENT_AMBULATORY_CARE_PROVIDER_SITE_OTHER): Payer: Self-pay | Admitting: Nurse Practitioner

## 2021-05-20 ENCOUNTER — Other Ambulatory Visit: Payer: Self-pay

## 2021-05-20 VITALS — BP 125/71 | HR 65 | Resp 16 | Ht 65.5 in | Wt 185.6 lb

## 2021-05-20 DIAGNOSIS — N183 Chronic kidney disease, stage 3 unspecified: Secondary | ICD-10-CM

## 2021-05-20 DIAGNOSIS — E785 Hyperlipidemia, unspecified: Secondary | ICD-10-CM

## 2021-05-20 DIAGNOSIS — I739 Peripheral vascular disease, unspecified: Secondary | ICD-10-CM

## 2021-05-20 DIAGNOSIS — E1122 Type 2 diabetes mellitus with diabetic chronic kidney disease: Secondary | ICD-10-CM

## 2021-05-20 DIAGNOSIS — M79604 Pain in right leg: Secondary | ICD-10-CM

## 2021-05-20 DIAGNOSIS — I10 Essential (primary) hypertension: Secondary | ICD-10-CM

## 2021-05-20 MED ORDER — CLOPIDOGREL BISULFATE 75 MG PO TABS
75.0000 mg | ORAL_TABLET | Freq: Every day | ORAL | 6 refills | Status: DC
Start: 2021-05-20 — End: 2021-05-29

## 2021-05-20 NOTE — Telephone Encounter (Signed)
Just seeing this I have not called daughter. They are now your second patient tomorrow morning though.

## 2021-05-20 NOTE — Telephone Encounter (Signed)
We can disc tomorrow not sure what has BP been running?

## 2021-05-21 ENCOUNTER — Ambulatory Visit (INDEPENDENT_AMBULATORY_CARE_PROVIDER_SITE_OTHER): Payer: Medicare HMO | Admitting: Internal Medicine

## 2021-05-21 ENCOUNTER — Encounter: Payer: Self-pay | Admitting: Internal Medicine

## 2021-05-21 ENCOUNTER — Other Ambulatory Visit: Payer: Self-pay

## 2021-05-21 ENCOUNTER — Telehealth: Payer: Self-pay | Admitting: Internal Medicine

## 2021-05-21 VITALS — BP 114/66 | HR 60 | Temp 97.4°F | Ht 65.5 in | Wt 183.4 lb

## 2021-05-21 DIAGNOSIS — N1831 Chronic kidney disease, stage 3a: Secondary | ICD-10-CM | POA: Diagnosis not present

## 2021-05-21 DIAGNOSIS — I5021 Acute systolic (congestive) heart failure: Secondary | ICD-10-CM

## 2021-05-21 DIAGNOSIS — I63542 Cerebral infarction due to unspecified occlusion or stenosis of left cerebellar artery: Secondary | ICD-10-CM | POA: Insufficient documentation

## 2021-05-21 DIAGNOSIS — I739 Peripheral vascular disease, unspecified: Secondary | ICD-10-CM | POA: Diagnosis not present

## 2021-05-21 DIAGNOSIS — I38 Endocarditis, valve unspecified: Secondary | ICD-10-CM | POA: Diagnosis not present

## 2021-05-21 DIAGNOSIS — D72829 Elevated white blood cell count, unspecified: Secondary | ICD-10-CM | POA: Diagnosis not present

## 2021-05-21 DIAGNOSIS — I10 Essential (primary) hypertension: Secondary | ICD-10-CM | POA: Diagnosis not present

## 2021-05-21 DIAGNOSIS — T148XXA Other injury of unspecified body region, initial encounter: Secondary | ICD-10-CM | POA: Diagnosis not present

## 2021-05-21 DIAGNOSIS — H6123 Impacted cerumen, bilateral: Secondary | ICD-10-CM

## 2021-05-21 DIAGNOSIS — E876 Hypokalemia: Secondary | ICD-10-CM | POA: Diagnosis not present

## 2021-05-21 LAB — CBC WITH DIFFERENTIAL/PLATELET
Basophils Absolute: 0.1 10*3/uL (ref 0.0–0.1)
Basophils Relative: 0.8 % (ref 0.0–3.0)
Eosinophils Absolute: 0.9 10*3/uL — ABNORMAL HIGH (ref 0.0–0.7)
Eosinophils Relative: 8.3 % — ABNORMAL HIGH (ref 0.0–5.0)
HCT: 40.1 % (ref 36.0–46.0)
Hemoglobin: 13.5 g/dL (ref 12.0–15.0)
Lymphocytes Relative: 30.4 % (ref 12.0–46.0)
Lymphs Abs: 3.2 10*3/uL (ref 0.7–4.0)
MCHC: 33.6 g/dL (ref 30.0–36.0)
MCV: 89.5 fl (ref 78.0–100.0)
Monocytes Absolute: 1.3 10*3/uL — ABNORMAL HIGH (ref 0.1–1.0)
Monocytes Relative: 12.3 % — ABNORMAL HIGH (ref 3.0–12.0)
Neutro Abs: 5.1 10*3/uL (ref 1.4–7.7)
Neutrophils Relative %: 48.2 % (ref 43.0–77.0)
Platelets: 259 10*3/uL (ref 150.0–400.0)
RBC: 4.48 Mil/uL (ref 3.87–5.11)
RDW: 15 % (ref 11.5–15.5)
WBC: 10.7 10*3/uL — ABNORMAL HIGH (ref 4.0–10.5)

## 2021-05-21 LAB — COMPREHENSIVE METABOLIC PANEL
ALT: 11 U/L (ref 0–35)
AST: 16 U/L (ref 0–37)
Albumin: 4 g/dL (ref 3.5–5.2)
Alkaline Phosphatase: 47 U/L (ref 39–117)
BUN: 22 mg/dL (ref 6–23)
CO2: 29 mEq/L (ref 19–32)
Calcium: 10 mg/dL (ref 8.4–10.5)
Chloride: 104 mEq/L (ref 96–112)
Creatinine, Ser: 1.02 mg/dL (ref 0.40–1.20)
GFR: 48.79 mL/min — ABNORMAL LOW (ref >60.00)
Glucose, Bld: 103 mg/dL — ABNORMAL HIGH (ref 70–99)
Potassium: 4.6 mEq/L (ref 3.5–5.1)
Sodium: 139 mEq/L (ref 135–145)
Total Bilirubin: 0.7 mg/dL (ref 0.2–1.2)
Total Protein: 6.7 g/dL (ref 6.0–8.3)

## 2021-05-21 LAB — MAGNESIUM: Magnesium: 2.2 mg/dL (ref 1.5–2.5)

## 2021-05-21 MED ORDER — LISINOPRIL 2.5 MG PO TABS: 2.5000 mg | ORAL_TABLET | Freq: Every day | ORAL | 3 refills | Status: DC

## 2021-05-21 MED ORDER — METOPROLOL SUCCINATE ER 25 MG PO TB24: 25.0000 mg | ORAL_TABLET | Freq: Every day | ORAL | 3 refills | Status: DC

## 2021-05-21 MED ORDER — MUPIROCIN 2 % EX OINT: 1.0000 "application " | TOPICAL_OINTMENT | Freq: Two times a day (BID) | CUTANEOUS | 0 refills | Status: DC

## 2021-05-21 MED ORDER — MUPIROCIN 2 % EX OINT
1.0000 | TOPICAL_OINTMENT | Freq: Two times a day (BID) | CUTANEOUS | 0 refills | Status: DC
Start: 2021-05-21 — End: 2021-05-21

## 2021-05-21 NOTE — Progress Notes (Signed)
Chief Complaint  Patient presents with  . Hospitalization Follow-up   HFU  With daughter   1. [facility] 05/14/21-05/16/21 for leg pain 4/10 today and weakness elevated d dimer neg PE/DVT, BP low normal and getting iv lasix 40 qd in hospital due to right pleural effusion CT chest. At discharge declined PT due to needs vascular procedure for severe PAD b/l legs with vascular upcoming wbc elevated and K low will repeat today 05/15/21 echo EF 45-50% with mild to mod mitral regurgitation needs cards f/u daughter wants to change dr [name] to Dr. Ubaldo Glassing she is taking lasix 20 mg qd prn  They reduced her BP meds in hospital meprolol 25 xl qd and lis 5 mg due to being on iv lasix today BP 114/66 and will titrate lis 5 mg to 2.5 mg qd    Chronic cerumen impaction refer to Dr. Tami Ribas  Review of Systems  Constitutional: Negative for weight loss.  HENT: Positive for hearing loss.   Eyes: Negative for blurred vision.  Respiratory: Negative for shortness of breath.   Cardiovascular: Negative for chest pain.  Musculoskeletal:       +PAD/leg pain  Skin: Negative for rash.  Neurological: Positive for weakness.  Psychiatric/Behavioral: Positive for memory loss.       Depressed mood due to health   Past Medical History:  Diagnosis Date  . Diabetes mellitus without complication ([place])   . DVT (deep venous thrombosis) ([place])    06/02/2019  . Hard of hearing   . Hypertension    Past Surgical History:  Procedure Laterality Date  . CESAREAN SECTION    . COLONOSCOPY N/A 05/17/2019   Procedure: FLEXIBLE SIGMOIDOSCOPY;  Surgeon: Ileana Roup, MD;  Location: [facility] ORS;  Service: General;  Laterality: N/A;  . COLONOSCOPY WITH PROPOFOL N/A 01/09/2019   Procedure: COLONOSCOPY WITH PROPOFOL;  Surgeon: Lin Landsman, MD;  Location: [facility] ENDOSCOPY;  Service: Gastroenterology;  Laterality: N/A;  . ESOPHAGOGASTRODUODENOSCOPY (EGD) WITH PROPOFOL N/A 01/09/2019   Procedure: ESOPHAGOGASTRODUODENOSCOPY (EGD) WITH  PROPOFOL;  Surgeon: Lin Landsman, MD;  Location: [facility] ENDOSCOPY;  Service: Gastroenterology;  Laterality: N/A;  . KNEE SURGERY     left 2017/2018  . SHOULDER SURGERY     right  . XI ROBOTIC ASSISTED LOWER ANTERIOR RESECTION N/A 05/17/2019   Procedure: XI ROBOTIC ASSISTED LOWER ANTERIOR RESECTION WITH END COLOSTOMY;  Surgeon: Ileana Roup, MD;  Location: [facility] ORS;  Service: General;  Laterality: N/A;   Family History  Problem Relation Age of Onset  . Addison's disease Maternal Aunt   . Heart disease Mother   . Heart disease Father    Social History   Socioeconomic History  . Marital status: Widowed    Spouse name: Not on file  . Number of children: Not on file  . Years of education: Not on file  . Highest education level: Not on file  Occupational History  . Not on file  Tobacco Use  . Smoking status: Former Smoker    Years: 4.00  . Smokeless tobacco: Never Used  . Tobacco comment: 3 per day  Vaping Use  . Vaping Use: Never used  Substance and Sexual Activity  . Alcohol use: No  . Drug use: No  . Sexual activity: Not on file  Other Topics Concern  . Not on file  Social History Narrative   Retired Pharmacist, hospital    2 daughters lives with [name]    1 son deceased    Widowed    Water quality scientist  from [place]   Social Determinants of Health   Financial Resource Strain: Low Risk   . Difficulty of Paying Living Expenses: Not hard at all  Food Insecurity: No Food Insecurity  . Worried About Charity fundraiser in the Last Year: Never true  . Ran Out of Food in the Last Year: Never true  Transportation Needs: No Transportation Needs  . Lack of Transportation (Medical): No  . Lack of Transportation (Non-Medical): No  Physical Activity: Insufficiently Active  . Days of Exercise per Week: 3 days  . Minutes of Exercise per Session: 20 min  Stress: No Stress Concern Present  . Feeling of Stress : Not at all  Social Connections: Unknown  . Frequency of Communication with Friends and  Family: More than three times a week  . Frequency of Social Gatherings with Friends and Family: More than three times a week  . Attends Religious Services: Not on file  . Active Member of Clubs or Organizations: Not on file  . Attends Archivist Meetings: Not on file  . Marital Status: Not on file  Intimate Partner Violence: Not At Risk  . Fear of Current or Ex-Partner: No  . Emotionally Abused: No  . Physically Abused: No  . Sexually Abused: No   Current Meds  Medication Sig  . aspirin 81 MG chewable tablet Chew 81 mg by mouth daily.  Marland Kitchen glimepiride (AMARYL) 2 MG tablet Take 1 tablet (2 mg total) by mouth daily with breakfast.  . lovastatin (MEVACOR) 20 MG tablet Take 1 tablet (20 mg total) by mouth daily at 6 PM.  . [DISCONTINUED] lisinopril (ZESTRIL) 5 MG tablet Take 1 tablet (5 mg total) by mouth daily.  . [DISCONTINUED] metoprolol succinate (TOPROL XL) 25 MG 24 hr tablet Take 1 tablet (25 mg total) by mouth daily.   Allergies  Allergen Reactions  . Metformin And Related     Diarrhea    Recent Results (from the past 2160 hour(s))  Basic metabolic panel     Status: Abnormal   Collection Time: 05/14/21  3:31 PM  Result Value Ref Range   Sodium 136 135 - 145 mmol/L   Potassium 4.6 3.5 - 5.1 mmol/L   Chloride 104 98 - 111 mmol/L   CO2 23 22 - 32 mmol/L   Glucose, Bld 114 (H) 70 - 99 mg/dL    Comment: Glucose reference range applies only to samples taken after fasting for at least 8 hours.   BUN 27 (H) 8 - 23 mg/dL   Creatinine, Ser 0.99 0.44 - 1.00 mg/dL   Calcium 9.3 8.9 - 10.3 mg/dL   GFR, Estimated 55 (L) >60 mL/min    Comment: (NOTE) Calculated using the CKD-EPI Creatinine Equation (2021)    Anion gap 9 5 - 15    Comment: Performed at [facility], [address] [place], [place] [address]  CBC     Status: None   Collection Time: 05/14/21  3:31 PM  Result Value Ref Range   WBC 9.2 4.0 - 10.5 K/uL   RBC 4.10 3.87 - 5.11 MIL/uL   Hemoglobin 12.2  12.0 - 15.0 g/dL   HCT 37.4 36.0 - 46.0 %   MCV 91.2 80.0 - 100.0 fL   MCH 29.8 26.0 - 34.0 pg   MCHC 32.6 30.0 - 36.0 g/dL   RDW 14.4 11.5 - 15.5 %   Platelets 231 150 - 400 K/uL   nRBC 0.0 0.0 - 0.2 %  Comment: Performed at [facility], [place], [place] [address]  Troponin I (High Sensitivity)     Status: Abnormal   Collection Time: 05/14/21  3:31 PM  Result Value Ref Range   Troponin I (High Sensitivity) 20 (H) <18 ng/L    Comment: (NOTE) Elevated high sensitivity troponin I (hsTnI) values and significant  changes across serial measurements may suggest ACS but many other  chronic and acute conditions are known to elevate hsTnI results.  Refer to the "Links" section for chest pain algorithms and additional  guidance. Performed at [facility], [address] [place], [place] [address]   Magnesium     Status: None   Collection Time: 05/14/21  3:31 PM  Result Value Ref Range   Magnesium 2.3 1.7 - 2.4 mg/dL    Comment: Performed at [facility], [address] [place], [place] [address]  CK     Status: None   Collection Time: 05/14/21  3:31 PM  Result Value Ref Range   Total CK 61 38 - 234 U/L    Comment: Performed at [facility], [address] [place], [place] [address]  Brain natriuretic peptide     Status: Abnormal   Collection Time: 05/14/21  3:31 PM  Result Value Ref Range   B Natriuretic Peptide 191.8 (H) 0.0 - 100.0 pg/mL    Comment: Performed at [facility], [address], [place], [place] [address]  Resp Panel by RT-PCR (Flu A&B, Covid) Nasopharyngeal Swab     Status: None   Collection Time: 05/14/21  4:33 PM   Specimen: Nasopharyngeal Swab; Nasopharyngeal(NP) swabs in vial transport medium  Result Value Ref Range   SARS Coronavirus 2 by RT PCR NEGATIVE NEGATIVE    Comment: (NOTE) SARS-CoV-2 target nucleic acids are NOT DETECTED.  The SARS-CoV-2 RNA is generally detectable in upper  respiratory specimens during the acute phase of infection. The lowest concentration of SARS-CoV-2 viral copies this assay can detect is 138 copies/mL. A negative result does not preclude SARS-Cov-2 infection and should not be used as the sole basis for treatment or other patient management decisions. A negative result may occur with  improper specimen collection/handling, submission of specimen other than nasopharyngeal swab, presence of viral mutation(s) within the areas targeted by this assay, and inadequate number of viral copies(<138 copies/mL). A negative result must be combined with clinical observations, patient history, and epidemiological information. The expected result is Negative.  Fact Sheet for Patients:  EntrepreneurPulse.com.au  Fact Sheet for Healthcare Providers:  IncredibleEmployment.be  This test is no t yet approved or cleared by the [place] FDA and  has been authorized for detection and/or diagnosis of SARS-CoV-2 by FDA under an Emergency Use Authorization (EUA). This EUA will remain  in effect (meaning this test can be used) for the duration of the COVID-19 declaration under Section 564(b)(1) of the Act, 21 U.S.C.section 360bbb-3(b)(1), unless the authorization is terminated  or revoked sooner.       Influenza A by PCR NEGATIVE NEGATIVE   Influenza B by PCR NEGATIVE NEGATIVE    Comment: (NOTE) The Xpert Xpress SARS-CoV-2/FLU/RSV plus assay is intended as an aid in the diagnosis of influenza from Nasopharyngeal swab specimens and should not be used as a sole basis for treatment. Nasal washings and aspirates are unacceptable for Xpert Xpress SARS-CoV-2/FLU/RSV testing.  Fact Sheet for Patients: EntrepreneurPulse.com.au  Fact Sheet for Healthcare Providers: IncredibleEmployment.be  This test is not yet approved or cleared by  the [employer] and has been authorized for  detection and/or diagnosis of SARS-CoV-2 by FDA under an Emergency Use Authorization (EUA). This EUA will remain in effect (meaning this test can be used) for the duration of the COVID-19 declaration under Section 564(b)(1) of the Act, 21 U.S.C. section 360bbb-3(b)(1), unless the authorization is terminated or revoked.  Performed at [facility], [address] [place], [place] [address]   Urinalysis, Complete w Microscopic     Status: Abnormal   Collection Time: 05/14/21  6:06 PM  Result Value Ref Range   Color, Urine STRAW (A) YELLOW   APPearance CLEAR (A) CLEAR   Specific Gravity, Urine 1.004 (L) 1.005 - 1.030   pH 7.0 5.0 - 8.0   Glucose, UA NEGATIVE NEGATIVE mg/dL   Hgb urine dipstick NEGATIVE NEGATIVE   Bilirubin Urine NEGATIVE NEGATIVE   Ketones, ur NEGATIVE NEGATIVE mg/dL   Protein, ur NEGATIVE NEGATIVE mg/dL   Nitrite NEGATIVE NEGATIVE   Leukocytes,Ua SMALL (A) NEGATIVE   RBC / HPF 0-5 0 - 5 RBC/hpf   WBC, UA 0-5 0 - 5 WBC/hpf   Bacteria, UA NONE SEEN NONE SEEN   Squamous Epithelial / LPF 0-5 0 - 5    Comment: Performed at [facility], [place], [place] [address]  Troponin I (High Sensitivity)     Status: Abnormal   Collection Time: 05/14/21  6:29 PM  Result Value Ref Range   Troponin I (High Sensitivity) 22 (H) <18 ng/L    Comment: (NOTE) Elevated high sensitivity troponin I (hsTnI) values and significant  changes across serial measurements may suggest ACS but many other  chronic and acute conditions are known to elevate hsTnI results.  Refer to the "Links" section for chest pain algorithms and additional  guidance. Performed at [facility], [address] [place], [place] [address]   D-dimer, quantitative     Status: Abnormal   Collection Time: 05/14/21  6:47 PM  Result Value Ref Range   D-Dimer, Quant 3.26 (H) 0.00 - 0.50 ug/mL-FEU    Comment: (NOTE) At the manufacturer cut-off value of 0.5 g/mL FEU, this  assay has a negative predictive value of 95-100%.This assay is intended for use in conjunction with a clinical pretest probability (PTP) assessment model to exclude pulmonary embolism (PE) and deep venous thrombosis (DVT) in outpatients suspected of PE or DVT. Results should be correlated with clinical presentation. Performed at [facility], [address] [place], [place] [address]   CBG monitoring, ED     Status: Abnormal   Collection Time: 05/14/21 11:19 PM  Result Value Ref Range   Glucose-Capillary 114 (H) 70 - 99 mg/dL    Comment: Glucose reference range applies only to samples taken after fasting for at least 8 hours.  TSH     Status: None   Collection Time: 05/14/21 11:21 PM  Result Value Ref Range   TSH 3.788 0.350 - 4.500 uIU/mL    Comment: Performed by a 3rd Generation assay with a functional sensitivity of <=0.01 uIU/mL. Performed at [facility], [address] [place], [place] [address]   Hemoglobin A1c     Status: Abnormal   Collection Time: 05/14/21 11:21 PM  Result Value Ref Range   Hgb A1c MFr Bld 7.0 (H) 4.8 - 5.6 %    Comment: (NOTE) Pre diabetes:          5.7%-6.4%  Diabetes:              >  6.4%  Glycemic control for   <7.0% adults with diabetes    Mean Plasma Glucose 154.2 mg/dL    Comment: Performed at [facility] [address], [place], [place] [address]  Basic metabolic panel     Status: Abnormal   Collection Time: 05/15/21  5:04 AM  Result Value Ref Range   Sodium 139 135 - 145 mmol/L   Potassium 3.9 3.5 - 5.1 mmol/L   Chloride 106 98 - 111 mmol/L   CO2 22 22 - 32 mmol/L   Glucose, Bld 133 (H) 70 - 99 mg/dL    Comment: Glucose reference range applies only to samples taken after fasting for at least 8 hours.   BUN 23 8 - 23 mg/dL   Creatinine, Ser 1.04 (H) 0.44 - 1.00 mg/dL   Calcium 9.3 8.9 - 10.3 mg/dL   GFR, Estimated 51 (L) >60 mL/min    Comment: (NOTE) Calculated using the CKD-EPI Creatinine Equation  (2021)    Anion gap 11 5 - 15    Comment: Performed at [facility], [address] [place], [place] [address]  CBC     Status: Abnormal   Collection Time: 05/15/21  5:04 AM  Result Value Ref Range   WBC 11.9 (H) 4.0 - 10.5 K/uL   RBC 4.47 3.87 - 5.11 MIL/uL   Hemoglobin 13.3 12.0 - 15.0 g/dL   HCT 40.5 36.0 - 46.0 %   MCV 90.6 80.0 - 100.0 fL   MCH 29.8 26.0 - 34.0 pg   MCHC 32.8 30.0 - 36.0 g/dL   RDW 14.3 11.5 - 15.5 %   Platelets 239 150 - 400 K/uL   nRBC 0.0 0.0 - 0.2 %    Comment: Performed at [facility], [address] [place], [place] [address]  CBG monitoring, ED     Status: Abnormal   Collection Time: 05/15/21  7:59 AM  Result Value Ref Range   Glucose-Capillary 100 (H) 70 - 99 mg/dL    Comment: Glucose reference range applies only to samples taken after fasting for at least 8 hours.  CBG monitoring, ED     Status: Abnormal   Collection Time: 05/15/21 11:52 AM  Result Value Ref Range   Glucose-Capillary 174 (H) 70 - 99 mg/dL    Comment: Glucose reference range applies only to samples taken after fasting for at least 8 hours.  Vitamin B12     Status: None   Collection Time: 05/15/21  1:02 PM  Result Value Ref Range   Vitamin B-12 512 180 - 914 pg/mL    Comment: (NOTE) This assay is not validated for testing neonatal or myeloproliferative syndrome specimens for Vitamin B12 levels. Performed at [facility], [place] [address], [place], [place] [address]   Glucose, capillary     Status: Abnormal   Collection Time: 05/15/21  8:55 PM  Result Value Ref Range   Glucose-Capillary 134 (H) 70 - 99 mg/dL    Comment: Glucose reference range applies only to samples taken after fasting for at least 8 hours.  ECHOCARDIOGRAM COMPLETE     Status: None   Collection Time: 05/15/21 10:08 PM  Result Value Ref Range   Weight 2,880 oz   BP 125/67 mmHg   S' Lateral 2.61 cm   Area-P 1/2 2.60 cm2  Folate     Status: None   Collection Time: 05/16/21  6:33  AM  Result Value Ref Range   Folate 16.8 >5.9 ng/mL  Comment: RESULT CONFIRMED BY MANUAL DILUTION...[facility] Performed at [facility], [address] [place], [place] [address]   CBC     Status: Abnormal   Collection Time: 05/16/21  6:33 AM  Result Value Ref Range   WBC 11.3 (H) 4.0 - 10.5 K/uL   RBC 4.25 3.87 - 5.11 MIL/uL   Hemoglobin 12.9 12.0 - 15.0 g/dL   HCT 38.3 36.0 - 46.0 %   MCV 90.1 80.0 - 100.0 fL   MCH 30.4 26.0 - 34.0 pg   MCHC 33.7 30.0 - 36.0 g/dL   RDW 14.6 11.5 - 15.5 %   Platelets 231 150 - 400 K/uL   nRBC 0.0 0.0 - 0.2 %    Comment: Performed at [facility], [address] [place], [place] [address]  Basic metabolic panel     Status: Abnormal   Collection Time: 05/16/21  6:33 AM  Result Value Ref Range   Sodium 138 135 - 145 mmol/L   Potassium 3.4 (L) 3.5 - 5.1 mmol/L   Chloride 104 98 - 111 mmol/L   CO2 26 22 - 32 mmol/L   Glucose, Bld 125 (H) 70 - 99 mg/dL    Comment: Glucose reference range applies only to samples taken after fasting for at least 8 hours.   BUN 30 (H) 8 - 23 mg/dL   Creatinine, Ser 1.10 (H) 0.44 - 1.00 mg/dL   Calcium 9.4 8.9 - 10.3 mg/dL   GFR, Estimated 48 (L) >60 mL/min    Comment: (NOTE) Calculated using the CKD-EPI Creatinine Equation (2021)    Anion gap 8 5 - 15    Comment: Performed at [facility], [address] [place], [place] [address]  Glucose, capillary     Status: Abnormal   Collection Time: 05/16/21  7:58 AM  Result Value Ref Range   Glucose-Capillary 116 (H) 70 - 99 mg/dL    Comment: Glucose reference range applies only to samples taken after fasting for at least 8 hours.  Glucose, capillary     Status: Abnormal   Collection Time: 05/16/21 11:14 AM  Result Value Ref Range   Glucose-Capillary 128 (H) 70 - 99 mg/dL    Comment: Glucose reference range applies only to samples taken after fasting for at least 8 hours.   Objective  Body mass index is 30.06 kg/m. Wt Readings from Last  3 Encounters:  05/21/21 183 lb 6.4 oz (83.2 kg)  05/20/21 185 lb 9.6 oz (84.2 kg)  05/14/21 180 lb (81.6 kg)   Temp Readings from Last 3 Encounters:  05/21/21 (!) 97.4 F (36.3 C) (Temporal)  05/16/21 98.3 F (36.8 C) (Oral)  01/08/21 98.6 F (37 C) (Oral)   BP Readings from Last 3 Encounters:  05/21/21 114/66  05/20/21 125/71  05/16/21 (!) 95/59   Pulse Readings from Last 3 Encounters:  05/21/21 60  05/20/21 65  05/16/21 68    Physical Exam Vitals and nursing note reviewed.  Constitutional:      Appearance: Normal appearance. She is well-developed and well-groomed.  HENT:     Head: Normocephalic and atraumatic.  Eyes:     Conjunctiva/sclera: Conjunctivae normal.     Pupils: Pupils are equal, round, and reactive to light.  Cardiovascular:     Rate and Rhythm: Normal rate and regular rhythm.     Heart sounds: Normal heart sounds. No murmur heard.   Pulmonary:     Effort: Pulmonary effort is normal.     Breath sounds: Normal breath  sounds.  Abdominal:     General: Abdomen is flat. Bowel sounds are normal.  Skin:    General: Skin is warm and dry.  Neurological:     General: No focal deficit present.     Mental Status: She is alert and oriented to person, place, and time. Mental status is at baseline.     Gait: Gait normal.  Psychiatric:        Attention and Perception: Attention and perception normal.        Mood and Affect: Mood and affect normal.        Speech: Speech normal.        Behavior: Behavior normal. Behavior is cooperative.        Thought Content: Thought content normal.        Cognition and Memory: Cognition and memory normal.        Judgment: Judgment normal.     Assessment  Plan  Acute systolic congestive heart failure (HCC) with right pleural effusion/HTN/valvular heart disease echo 05/15/21- Plan: Comprehensive metabolic panel Hypokalemia - Plan: Comprehensive metabolic panel, Magnesium Lis reduce to 2.5 mg from 5 mg qd  toprol xl 25 mg  qd Lovastatin 20 mg qhs  F/u cardiology Dr. Ubaldo Glassing   Leukocytosis, unspecified type - Plan: CBC w/Diff  PAD (peripheral artery disease) ([place]) Vascular pending appt outpatient to repair PAD May consider PT in the future after procedure   Bilateral impacted cerumen - Plan: Ambulatory referral to ENT Dr. Leland Her monitor renal function hydration   Left cerebellum stroke chronic, memory loss - Denies neurology for now   Left leg open wound  bactroban and skin care  HM Had flu shotutd utd prevnar  pna 23utd  covid pfizer 4/4  did have shingles shot in past but 11/29/20 Rx Tdap and shingrix   Tdap and shingrix considerwill disc in future again MMR immune  Never had colonoscopy cologuard + 4/1/17see chartdeclined colonoscopys/p rectal mass excisiondue againf/u1/2021; left colostomy bag long termby Dr. Gerald Stabs white, colonoscopy Dr. Marius Ditch No pap  mammo out of age window  dexa 01/21/14 normal  WBC 11.5 noted 05/31/18 labs   Reviewed CT ab/pelvis 02/11/18 b/l atrophic kidneys kidney right side and b/l kidney scarring, 1.3 cm gallstone, diverticulosis, aortic calcifications, MV calcification.  Provider: Dr. Olivia Mackie McLean-Scocuzza-Internal Medicine

## 2021-05-21 NOTE — Telephone Encounter (Signed)
Patient seen in office this morning 05/21/21

## 2021-05-21 NOTE — Telephone Encounter (Signed)
PTs daughter called in to request a refill of:  lisinopril (ZESTRIL) 2.5 MG tablet metoprolol succinate (TOPROL XL) 25 MG 24 hr tablet  To be sent in to the [facility]. She also stated the PT is refusing the ointment due to she does not want to pay $60 for the cream.

## 2021-05-21 NOTE — Patient Instructions (Addendum)
MD Physician    Primary Contact Information  Phone Fax E-mail Address  [phone] Not available [place] [id number]   Consider cardiology f/u Dr. Ubaldo Glassing    Dr. David [place] [facility] chief medical office disc treatment there

## 2021-05-22 ENCOUNTER — Telehealth (INDEPENDENT_AMBULATORY_CARE_PROVIDER_SITE_OTHER): Payer: Self-pay

## 2021-05-22 ENCOUNTER — Other Ambulatory Visit: Payer: Self-pay

## 2021-05-22 DIAGNOSIS — D72829 Elevated white blood cell count, unspecified: Secondary | ICD-10-CM

## 2021-05-22 NOTE — Telephone Encounter (Signed)
Spoke with the patient's daughter and she is scheduled with Dr. Lucky Cowboy for a RLE and LLE angio on 05/29/21 and 06/05/21 with a 6:45 am arrival time for both dates. Pre-procedure instructions were discussed and will be mailed.

## 2021-05-23 ENCOUNTER — Other Ambulatory Visit: Payer: Medicare HMO

## 2021-05-23 ENCOUNTER — Telehealth: Payer: Self-pay | Admitting: Internal Medicine

## 2021-05-23 NOTE — Telephone Encounter (Signed)
"  Rejection Reason - Patient Declined - DECLINED AT THIS TIME PER DAUGHTER - OTHER PRESSING APPOINTMENTS - SEE PREVIOUS NOTES" Jannet Askew said on May 23, 2021 12:27 PM  msg from [facility] cardio

## 2021-05-26 ENCOUNTER — Encounter (INDEPENDENT_AMBULATORY_CARE_PROVIDER_SITE_OTHER): Payer: Self-pay | Admitting: Nurse Practitioner

## 2021-05-26 NOTE — Progress Notes (Signed)
Subjective:    Patient ID: Heidi Whitaker, female    DOB: [birth year], 87 y.o.   MRN: [id number] Chief Complaint  Patient presents with  . New Patient (Initial Visit)    Ref [facility] 1 week claudication     Heidi Whitaker is an 87 year old female that presents today after a recent presentation to the emergency room due to severe pain in her lower extremities.  The patient's daughter notes that she began to have severe pain and cramping in her lower extremities and ever since that incident she has not been able to resume her normal everyday activities.  Typically the patient was very active between standing and doing things about the house.  Now she cannot even climb a flight of stairs without stopping and resting.  She no longer does the things that she enjoys and can only go from her bedroom to her porch.  It is noted that the feet are cold and pale.  There is also pain during the evening at rest.  There are no open wounds or ulcerations.  ABIs done at the hospital showed an ABI of 0.38 on the right and 0.48 on the left.   Review of Systems  Cardiovascular:       Claudication and rest pain  Skin: Positive for pallor.       Objective:   Physical Exam Vitals reviewed.  HENT:     Head: Normocephalic.  Cardiovascular:     Rate and Rhythm: Normal rate.     Pulses: Decreased pulses.  Neurological:     Mental Status: She is alert and oriented to person, place, and time.     Motor: Weakness present.  Psychiatric:        Mood and Affect: Mood normal.        Behavior: Behavior normal.        Thought Content: Thought content normal.        Judgment: Judgment normal.     BP 125/71 (BP Location: Right Arm)   Pulse 65   Resp 16   Ht 5' 5.5" (1.664 m)   Wt 185 lb 9.6 oz (84.2 kg)   BMI 30.42 kg/m   Past Medical History:  Diagnosis Date  . Diabetes mellitus without complication ([place])   . DVT (deep venous thrombosis) ([place])    06/02/2019  . Hard of hearing   . Hypertension      Social History   Socioeconomic History  . Marital status: Widowed    Spouse name: Not on file  . Number of children: Not on file  . Years of education: Not on file  . Highest education level: Not on file  Occupational History  . Not on file  Tobacco Use  . Smoking status: Former Smoker    Years: 4.00  . Smokeless tobacco: Never Used  . Tobacco comment: 3 per day  Vaping Use  . Vaping Use: Never used  Substance and Sexual Activity  . Alcohol use: No  . Drug use: No  . Sexual activity: Not on file  Other Topics Concern  . Not on file  Social History Narrative   Retired Pharmacist, hospital    2 daughters lives with [name]    1 son deceased    Widowed    Moved from [place] Strain: [place]   . Difficulty of Paying Living Expenses: Not hard at all  Food Insecurity: No Food Insecurity  . Worried About Running  Out of Food in the Last Year: Never true  . Ran Out of Food in the Last Year: Never true  Transportation Needs: No Transportation Needs  . Lack of Transportation (Medical): No  . Lack of Transportation (Non-Medical): No  Physical Activity: Insufficiently Active  . Days of Exercise per Week: 3 days  . Minutes of Exercise per Session: 20 min  Stress: No Stress Concern Present  . Feeling of Stress : Not at all  Social Connections: Unknown  . Frequency of Communication with Friends and Family: More than three times a week  . Frequency of Social Gatherings with Friends and Family: More than three times a week  . Attends Religious Services: Not on file  . Active Member of Clubs or Organizations: Not on file  . Attends Archivist Meetings: Not on file  . Marital Status: Not on file  Intimate Partner Violence: Not At Risk  . Fear of Current or Ex-Partner: No  . Emotionally Abused: No  . Physically Abused: No  . Sexually Abused: No    Past Surgical History:  Procedure Laterality Date  . CESAREAN SECTION    .  COLONOSCOPY N/A 05/17/2019   Procedure: FLEXIBLE SIGMOIDOSCOPY;  Surgeon: Ileana Roup, MD;  [facility];  Service: General;  Laterality: N/A;  . COLONOSCOPY WITH PROPOFOL N/A 01/09/2019   Procedure: COLONOSCOPY WITH PROPOFOL;  Surgeon: Lin Landsman, MD;  Location: [facility] ENDOSCOPY;  Service: Gastroenterology;  Laterality: N/A;  . ESOPHAGOGASTRODUODENOSCOPY (EGD) WITH PROPOFOL N/A 01/09/2019   Procedure: ESOPHAGOGASTRODUODENOSCOPY (EGD) WITH PROPOFOL;  Surgeon: Lin Landsman, MD;  Location: [facility] ENDOSCOPY;  Service: Gastroenterology;  Laterality: N/A;  . KNEE SURGERY     left 2017/2018  . SHOULDER SURGERY     right  . XI ROBOTIC ASSISTED LOWER ANTERIOR RESECTION N/A 05/17/2019   Procedure: XI ROBOTIC ASSISTED LOWER ANTERIOR RESECTION WITH END COLOSTOMY;  Surgeon: Ileana Roup, MD;  [facility];  Service: General;  Laterality: N/A;    Family History  Problem Relation Age of Onset  . Addison's disease Maternal Aunt   . Heart disease Mother   . Heart disease Father     Allergies  Allergen Reactions  . Metformin And Related     Diarrhea     CBC Latest Ref Rng & Units 05/21/2021 05/16/2021 05/15/2021  WBC 4.0 - 10.5 K/uL 10.7(H) 11.3(H) 11.9(H)  Hemoglobin 12.0 - 15.0 g/dL 13.5 12.9 13.3  Hematocrit 36.0 - 46.0 % 40.1 38.3 40.5  Platelets 150.0 - 400.0 K/uL 259.0 231 239      CMP     Component Value Date/Time   NA 139 05/21/2021 0900   K 4.6 05/21/2021 0900   CL 104 05/21/2021 0900   CO2 29 05/21/2021 0900   GLUCOSE 103 (H) 05/21/2021 0900   BUN 22 05/21/2021 0900   CREATININE 1.02 05/21/2021 0900   CALCIUM 10.0 05/21/2021 0900   PROT 6.7 05/21/2021 0900   ALBUMIN 4.0 05/21/2021 0900   AST 16 05/21/2021 0900   ALT 11 05/21/2021 0900   ALKPHOS 47 05/21/2021 0900   BILITOT 0.7 05/21/2021 0900   GFRNONAA 48 (L) 05/16/2021 0633   GFRAA >60 06/02/2019 2009     No results found.     Assessment & Plan:   1. PAD (peripheral artery  disease) (HCC) Recommend:  The patient has evidence of severe atherosclerotic changes of both lower extremities with rest pain that is associated with preulcerative changes and impending tissue loss of the foot.  This represents a limb threatening ischemia and places the patient at the risk for limb loss.  Patient should undergo angiography of the lower extremities with the hope for intervention for limb salvage.  The risks and benefits as well as the alternative therapies was discussed in detail with the patient.  All questions were answered.  Patient agrees to proceed with angiography.  The patient will follow up with me in the office after the procedure.      - VAS [place] ABI WITH/WO TBI - clopidogrel (PLAVIX) 75 MG tablet; Take 1 tablet (75 mg total) by mouth daily. (Patient not taking: Reported on 05/21/2021)  Dispense: 30 tablet; Refill: 6  2. Type 2 diabetes mellitus with stage 3 chronic kidney disease, without long-term current use of insulin, unspecified whether stage 3a or 3b CKD ([place]) Continue hypoglycemic medications as already ordered, these medications have been reviewed and there are no changes at this time.  Hgb A1C to be monitored as already arranged by primary service   3. Hyperlipidemia, unspecified hyperlipidemia type Continue statin as ordered and reviewed, no changes at this time   4. Hypertension, unspecified type Continue antihypertensive medications as already ordered, these medications have been reviewed and there are no changes at this time.    Current Outpatient Medications on File Prior to Visit  Medication Sig Dispense Refill  . aspirin 81 MG chewable tablet Chew 81 mg by mouth daily.    . furosemide (LASIX) 20 MG tablet Take 1 tablet (20 mg total) by mouth daily as needed. (Patient not taking: Reported on 05/21/2021) 90 tablet 3  . glimepiride (AMARYL) 2 MG tablet Take 1 tablet (2 mg total) by mouth daily with breakfast. 90 tablet 3  . lovastatin (MEVACOR)  20 MG tablet Take 1 tablet (20 mg total) by mouth daily at 6 PM. 90 tablet 3   No current facility-administered medications on file prior to visit.    There are no Patient Instructions on file for this visit. No follow-ups on file.   Kris Hartmann, NP

## 2021-05-26 NOTE — H&P (View-Only) (Signed)
Subjective:    Patient ID: Heidi Whitaker, female    DOB: [birth year], 87 y.o.   MRN: [id number] Chief Complaint  Patient presents with  . New Patient (Initial Visit)    Ref [facility] 1 week claudication     Heidi Whitaker is an 87 year old female that presents today after a recent presentation to the emergency room due to severe pain in her lower extremities.  The patient's daughter notes that she began to have severe pain and cramping in her lower extremities and ever since that incident she has not been able to resume her normal everyday activities.  Typically the patient was very active between standing and doing things about the house.  Now she cannot even climb a flight of stairs without stopping and resting.  She no longer does the things that she enjoys and can only go from her bedroom to her porch.  It is noted that the feet are cold and pale.  There is also pain during the evening at rest.  There are no open wounds or ulcerations.  ABIs done at the hospital showed an ABI of 0.38 on the right and 0.48 on the left.   Review of Systems  Cardiovascular:       Claudication and rest pain  Skin: Positive for pallor.       Objective:   Physical Exam Vitals reviewed.  HENT:     Head: Normocephalic.  Cardiovascular:     Rate and Rhythm: Normal rate.     Pulses: Decreased pulses.  Neurological:     Mental Status: She is alert and oriented to person, place, and time.     Motor: Weakness present.  Psychiatric:        Mood and Affect: Mood normal.        Behavior: Behavior normal.        Thought Content: Thought content normal.        Judgment: Judgment normal.     BP 125/71 (BP Location: Right Arm)   Pulse 65   Resp 16   Ht 5' 5.5" (1.664 m)   Wt 185 lb 9.6 oz (84.2 kg)   BMI 30.42 kg/m   Past Medical History:  Diagnosis Date  . Diabetes mellitus without complication ([place])   . DVT (deep venous thrombosis) ([place])    06/02/2019  . Hard of hearing   . Hypertension      Social History   Socioeconomic History  . Marital status: Widowed    Spouse name: Not on file  . Number of children: Not on file  . Years of education: Not on file  . Highest education level: Not on file  Occupational History  . Not on file  Tobacco Use  . Smoking status: Former Smoker    Years: 4.00  . Smokeless tobacco: Never Used  . Tobacco comment: 3 per day  Vaping Use  . Vaping Use: Never used  Substance and Sexual Activity  . Alcohol use: No  . Drug use: No  . Sexual activity: Not on file  Other Topics Concern  . Not on file  Social History Narrative   Retired Pharmacist, hospital    2 daughters lives with [name]    1 son deceased    Widowed    Moved from [place] Strain: [place]   . Difficulty of Paying Living Expenses: Not hard at all  Food Insecurity: No Food Insecurity  . Worried About Running  Out of Food in the Last Year: Never true  . Ran Out of Food in the Last Year: Never true  Transportation Needs: No Transportation Needs  . Lack of Transportation (Medical): No  . Lack of Transportation (Non-Medical): No  Physical Activity: Insufficiently Active  . Days of Exercise per Week: 3 days  . Minutes of Exercise per Session: 20 min  Stress: No Stress Concern Present  . Feeling of Stress : Not at all  Social Connections: Unknown  . Frequency of Communication with Friends and Family: More than three times a week  . Frequency of Social Gatherings with Friends and Family: More than three times a week  . Attends Religious Services: Not on file  . Active Member of Clubs or Organizations: Not on file  . Attends Archivist Meetings: Not on file  . Marital Status: Not on file  Intimate Partner Violence: Not At Risk  . Fear of Current or Ex-Partner: No  . Emotionally Abused: No  . Physically Abused: No  . Sexually Abused: No    Past Surgical History:  Procedure Laterality Date  . CESAREAN SECTION    .  COLONOSCOPY N/A 05/17/2019   Procedure: FLEXIBLE SIGMOIDOSCOPY;  Surgeon: Ileana Roup, MD;  [facility];  Service: General;  Laterality: N/A;  . COLONOSCOPY WITH PROPOFOL N/A 01/09/2019   Procedure: COLONOSCOPY WITH PROPOFOL;  Surgeon: Lin Landsman, MD;  Location: [facility] ENDOSCOPY;  Service: Gastroenterology;  Laterality: N/A;  . ESOPHAGOGASTRODUODENOSCOPY (EGD) WITH PROPOFOL N/A 01/09/2019   Procedure: ESOPHAGOGASTRODUODENOSCOPY (EGD) WITH PROPOFOL;  Surgeon: Lin Landsman, MD;  Location: [facility] ENDOSCOPY;  Service: Gastroenterology;  Laterality: N/A;  . KNEE SURGERY     left 2017/2018  . SHOULDER SURGERY     right  . XI ROBOTIC ASSISTED LOWER ANTERIOR RESECTION N/A 05/17/2019   Procedure: XI ROBOTIC ASSISTED LOWER ANTERIOR RESECTION WITH END COLOSTOMY;  Surgeon: Ileana Roup, MD;  [facility];  Service: General;  Laterality: N/A;    Family History  Problem Relation Age of Onset  . Addison's disease Maternal Aunt   . Heart disease Mother   . Heart disease Father     Allergies  Allergen Reactions  . Metformin And Related     Diarrhea     CBC Latest Ref Rng & Units 05/21/2021 05/16/2021 05/15/2021  WBC 4.0 - 10.5 K/uL 10.7(H) 11.3(H) 11.9(H)  Hemoglobin 12.0 - 15.0 g/dL 13.5 12.9 13.3  Hematocrit 36.0 - 46.0 % 40.1 38.3 40.5  Platelets 150.0 - 400.0 K/uL 259.0 231 239      CMP     Component Value Date/Time   NA 139 05/21/2021 0900   K 4.6 05/21/2021 0900   CL 104 05/21/2021 0900   CO2 29 05/21/2021 0900   GLUCOSE 103 (H) 05/21/2021 0900   BUN 22 05/21/2021 0900   CREATININE 1.02 05/21/2021 0900   CALCIUM 10.0 05/21/2021 0900   PROT 6.7 05/21/2021 0900   ALBUMIN 4.0 05/21/2021 0900   AST 16 05/21/2021 0900   ALT 11 05/21/2021 0900   ALKPHOS 47 05/21/2021 0900   BILITOT 0.7 05/21/2021 0900   GFRNONAA 48 (L) 05/16/2021 0633   GFRAA >60 06/02/2019 2009     No results found.     Assessment & Plan:   1. PAD (peripheral artery  disease) (HCC) Recommend:  The patient has evidence of severe atherosclerotic changes of both lower extremities with rest pain that is associated with preulcerative changes and impending tissue loss of the foot.  This represents a limb threatening ischemia and places the patient at the risk for limb loss.  Patient should undergo angiography of the lower extremities with the hope for intervention for limb salvage.  The risks and benefits as well as the alternative therapies was discussed in detail with the patient.  All questions were answered.  Patient agrees to proceed with angiography.  The patient will follow up with me in the office after the procedure.      - VAS [place] ABI WITH/WO TBI - clopidogrel (PLAVIX) 75 MG tablet; Take 1 tablet (75 mg total) by mouth daily. (Patient not taking: Reported on 05/21/2021)  Dispense: 30 tablet; Refill: 6  2. Type 2 diabetes mellitus with stage 3 chronic kidney disease, without long-term current use of insulin, unspecified whether stage 3a or 3b CKD ([place]) Continue hypoglycemic medications as already ordered, these medications have been reviewed and there are no changes at this time.  Hgb A1C to be monitored as already arranged by primary service   3. Hyperlipidemia, unspecified hyperlipidemia type Continue statin as ordered and reviewed, no changes at this time   4. Hypertension, unspecified type Continue antihypertensive medications as already ordered, these medications have been reviewed and there are no changes at this time.    Current Outpatient Medications on File Prior to Visit  Medication Sig Dispense Refill  . aspirin 81 MG chewable tablet Chew 81 mg by mouth daily.    . furosemide (LASIX) 20 MG tablet Take 1 tablet (20 mg total) by mouth daily as needed. (Patient not taking: Reported on 05/21/2021) 90 tablet 3  . glimepiride (AMARYL) 2 MG tablet Take 1 tablet (2 mg total) by mouth daily with breakfast. 90 tablet 3  . lovastatin (MEVACOR)  20 MG tablet Take 1 tablet (20 mg total) by mouth daily at 6 PM. 90 tablet 3   No current facility-administered medications on file prior to visit.    There are no Patient Instructions on file for this visit. No follow-ups on file.   Kris Hartmann, NP

## 2021-05-26 NOTE — H&P (View-Only) (Signed)
Subjective:    Patient ID: Heidi Whitaker, female    DOB: [birth year], 87 y.o.   MRN: [id number] Chief Complaint  Patient presents with  . New Patient (Initial Visit)    Ref [facility] 1 week claudication     Heidi Whitaker is an 87 year old female that presents today after a recent presentation to the emergency room due to severe pain in her lower extremities.  The patient's daughter notes that she began to have severe pain and cramping in her lower extremities and ever since that incident she has not been able to resume her normal everyday activities.  Typically the patient was very active between standing and doing things about the house.  Now she cannot even climb a flight of stairs without stopping and resting.  She no longer does the things that she enjoys and can only go from her bedroom to her porch.  It is noted that the feet are cold and pale.  There is also pain during the evening at rest.  There are no open wounds or ulcerations.  ABIs done at the hospital showed an ABI of 0.38 on the right and 0.48 on the left.   Review of Systems  Cardiovascular:       Claudication and rest pain  Skin: Positive for pallor.       Objective:   Physical Exam Vitals reviewed.  HENT:     Head: Normocephalic.  Cardiovascular:     Rate and Rhythm: Normal rate.     Pulses: Decreased pulses.  Neurological:     Mental Status: She is alert and oriented to person, place, and time.     Motor: Weakness present.  Psychiatric:        Mood and Affect: Mood normal.        Behavior: Behavior normal.        Thought Content: Thought content normal.        Judgment: Judgment normal.     BP 125/71 (BP Location: Right Arm)   Pulse 65   Resp 16   Ht 5' 5.5" (1.664 m)   Wt 185 lb 9.6 oz (84.2 kg)   BMI 30.42 kg/m   Past Medical History:  Diagnosis Date  . Diabetes mellitus without complication ([place])   . DVT (deep venous thrombosis) ([place])    06/02/2019  . Hard of hearing   . Hypertension      Social History   Socioeconomic History  . Marital status: Widowed    Spouse name: Not on file  . Number of children: Not on file  . Years of education: Not on file  . Highest education level: Not on file  Occupational History  . Not on file  Tobacco Use  . Smoking status: Former Smoker    Years: 4.00  . Smokeless tobacco: Never Used  . Tobacco comment: 3 per day  Vaping Use  . Vaping Use: Never used  Substance and Sexual Activity  . Alcohol use: No  . Drug use: No  . Sexual activity: Not on file  Other Topics Concern  . Not on file  Social History Narrative   Retired Pharmacist, hospital    2 daughters lives with Heidi Whitaker    1 son deceased    Widowed    Moved from [place] Strain: [place]   . Difficulty of Paying Living Expenses: Not hard at all  Food Insecurity: No Food Insecurity  . Worried About Running  Out of Food in the Last Year: Never true  . Ran Out of Food in the Last Year: Never true  Transportation Needs: No Transportation Needs  . Lack of Transportation (Medical): No  . Lack of Transportation (Non-Medical): No  Physical Activity: Insufficiently Active  . Days of Exercise per Week: 3 days  . Minutes of Exercise per Session: 20 min  Stress: No Stress Concern Present  . Feeling of Stress : Not at all  Social Connections: Unknown  . Frequency of Communication with Friends and Family: More than three times a week  . Frequency of Social Gatherings with Friends and Family: More than three times a week  . Attends Religious Services: Not on file  . Active Member of Clubs or Organizations: Not on file  . Attends Archivist Meetings: Not on file  . Marital Status: Not on file  Intimate Partner Violence: Not At Risk  . Fear of Current or Ex-Partner: No  . Emotionally Abused: No  . Physically Abused: No  . Sexually Abused: No    Past Surgical History:  Procedure Laterality Date  . CESAREAN SECTION    .  COLONOSCOPY N/A 05/17/2019   Procedure: FLEXIBLE SIGMOIDOSCOPY;  Surgeon: Ileana Roup, MD;  [facility];  Service: General;  Laterality: N/A;  . COLONOSCOPY WITH PROPOFOL N/A 01/09/2019   Procedure: COLONOSCOPY WITH PROPOFOL;  Surgeon: Lin Landsman, MD;  Location: [facility] ENDOSCOPY;  Service: Gastroenterology;  Laterality: N/A;  . ESOPHAGOGASTRODUODENOSCOPY (EGD) WITH PROPOFOL N/A 01/09/2019   Procedure: ESOPHAGOGASTRODUODENOSCOPY (EGD) WITH PROPOFOL;  Surgeon: Lin Landsman, MD;  Location: [facility] ENDOSCOPY;  Service: Gastroenterology;  Laterality: N/A;  . KNEE SURGERY     left 2017/2018  . SHOULDER SURGERY     right  . XI ROBOTIC ASSISTED LOWER ANTERIOR RESECTION N/A 05/17/2019   Procedure: XI ROBOTIC ASSISTED LOWER ANTERIOR RESECTION WITH END COLOSTOMY;  Surgeon: Ileana Roup, MD;  [facility];  Service: General;  Laterality: N/A;    Family History  Problem Relation Age of Onset  . Addison's disease Maternal Aunt   . Heart disease Mother   . Heart disease Father     Allergies  Allergen Reactions  . Metformin And Related     Diarrhea     CBC Latest Ref Rng & Units 05/21/2021 05/16/2021 05/15/2021  WBC 4.0 - 10.5 K/uL 10.7(H) 11.3(H) 11.9(H)  Hemoglobin 12.0 - 15.0 g/dL 13.5 12.9 13.3  Hematocrit 36.0 - 46.0 % 40.1 38.3 40.5  Platelets 150.0 - 400.0 K/uL 259.0 231 239      CMP     Component Value Date/Time   NA 139 05/21/2021 0900   K 4.6 05/21/2021 0900   CL 104 05/21/2021 0900   CO2 29 05/21/2021 0900   GLUCOSE 103 (H) 05/21/2021 0900   BUN 22 05/21/2021 0900   CREATININE 1.02 05/21/2021 0900   CALCIUM 10.0 05/21/2021 0900   PROT 6.7 05/21/2021 0900   ALBUMIN 4.0 05/21/2021 0900   AST 16 05/21/2021 0900   ALT 11 05/21/2021 0900   ALKPHOS 47 05/21/2021 0900   BILITOT 0.7 05/21/2021 0900   GFRNONAA 48 (L) 05/16/2021 0633   GFRAA >60 06/02/2019 2009     No results found.     Assessment & Plan:   1. PAD (peripheral artery  disease) (HCC) Recommend:  The patient has evidence of severe atherosclerotic changes of both lower extremities with rest pain that is associated with preulcerative changes and impending tissue loss of the foot.  This represents a limb threatening ischemia and places the patient at the risk for limb loss.  Patient should undergo angiography of the lower extremities with the hope for intervention for limb salvage.  The risks and benefits as well as the alternative therapies was discussed in detail with the patient.  All questions were answered.  Patient agrees to proceed with angiography.  The patient will follow up with me in the office after the procedure.      - VAS [place] ABI WITH/WO TBI - clopidogrel (PLAVIX) 75 MG tablet; Take 1 tablet (75 mg total) by mouth daily. (Patient not taking: Reported on 05/21/2021)  Dispense: 30 tablet; Refill: 6  2. Type 2 diabetes mellitus with stage 3 chronic kidney disease, without long-term current use of insulin, unspecified whether stage 3a or 3b CKD ([place]) Continue hypoglycemic medications as already ordered, these medications have been reviewed and there are no changes at this time.  Hgb A1C to be monitored as already arranged by primary service   3. Hyperlipidemia, unspecified hyperlipidemia type Continue statin as ordered and reviewed, no changes at this time   4. Hypertension, unspecified type Continue antihypertensive medications as already ordered, these medications have been reviewed and there are no changes at this time.    Current Outpatient Medications on File Prior to Visit  Medication Sig Dispense Refill  . aspirin 81 MG chewable tablet Chew 81 mg by mouth daily.    . furosemide (LASIX) 20 MG tablet Take 1 tablet (20 mg total) by mouth daily as needed. (Patient not taking: Reported on 05/21/2021) 90 tablet 3  . glimepiride (AMARYL) 2 MG tablet Take 1 tablet (2 mg total) by mouth daily with breakfast. 90 tablet 3  . lovastatin (MEVACOR)  20 MG tablet Take 1 tablet (20 mg total) by mouth daily at 6 PM. 90 tablet 3   No current facility-administered medications on file prior to visit.    There are no Patient Instructions on file for this visit. No follow-ups on file.   Kris Hartmann, NP

## 2021-05-29 ENCOUNTER — Ambulatory Visit
Admission: RE | Admit: 2021-05-29 | Discharge: 2021-05-29 | Disposition: A | Discharge status: Home / Self Care | Payer: Medicare HMO | Attending: Vascular Surgery | Admitting: Vascular Surgery

## 2021-05-29 ENCOUNTER — Encounter
Admission: RE | Disposition: A | Discharge status: Home / Self Care | Payer: Self-pay | Source: Home / Self Care | Attending: Vascular Surgery

## 2021-05-29 ENCOUNTER — Other Ambulatory Visit (INDEPENDENT_AMBULATORY_CARE_PROVIDER_SITE_OTHER): Payer: Self-pay | Admitting: Nurse Practitioner

## 2021-05-29 ENCOUNTER — Encounter: Payer: Self-pay | Admitting: Vascular Surgery

## 2021-05-29 DIAGNOSIS — N183 Chronic kidney disease, stage 3 unspecified: Secondary | ICD-10-CM | POA: Insufficient documentation

## 2021-05-29 DIAGNOSIS — E1151 Type 2 diabetes mellitus with diabetic peripheral angiopathy without gangrene: Secondary | ICD-10-CM | POA: Diagnosis not present

## 2021-05-29 DIAGNOSIS — Z7982 Long term (current) use of aspirin: Secondary | ICD-10-CM | POA: Insufficient documentation

## 2021-05-29 DIAGNOSIS — Z79899 Other long term (current) drug therapy: Secondary | ICD-10-CM | POA: Insufficient documentation

## 2021-05-29 DIAGNOSIS — Z87891 Personal history of nicotine dependence: Secondary | ICD-10-CM | POA: Diagnosis not present

## 2021-05-29 DIAGNOSIS — E785 Hyperlipidemia, unspecified: Secondary | ICD-10-CM | POA: Insufficient documentation

## 2021-05-29 DIAGNOSIS — I70229 Atherosclerosis of native arteries of extremities with rest pain, unspecified extremity: Secondary | ICD-10-CM

## 2021-05-29 DIAGNOSIS — E1122 Type 2 diabetes mellitus with diabetic chronic kidney disease: Secondary | ICD-10-CM | POA: Insufficient documentation

## 2021-05-29 DIAGNOSIS — I129 Hypertensive chronic kidney disease with stage 1 through stage 4 chronic kidney disease, or unspecified chronic kidney disease: Secondary | ICD-10-CM | POA: Diagnosis not present

## 2021-05-29 DIAGNOSIS — I70222 Atherosclerosis of native arteries of extremities with rest pain, left leg: Secondary | ICD-10-CM | POA: Insufficient documentation

## 2021-05-29 DIAGNOSIS — I739 Peripheral vascular disease, unspecified: Secondary | ICD-10-CM

## 2021-05-29 DIAGNOSIS — I70223 Atherosclerosis of native arteries of extremities with rest pain, bilateral legs: Secondary | ICD-10-CM | POA: Diagnosis not present

## 2021-05-29 DIAGNOSIS — Z7984 Long term (current) use of oral hypoglycemic drugs: Secondary | ICD-10-CM | POA: Diagnosis not present

## 2021-05-29 HISTORY — PX: LOWER EXTREMITY ANGIOGRAPHY: CATH118251

## 2021-05-29 LAB — BUN: BUN: 20 mg/dL (ref 8–23)

## 2021-05-29 LAB — GLUCOSE, CAPILLARY: Glucose-Capillary: 106 mg/dL — ABNORMAL HIGH (ref 70–99)

## 2021-05-29 LAB — CREATININE, SERUM
Creatinine, Ser: 1.13 mg/dL — ABNORMAL HIGH (ref 0.44–1.00)
GFR, Estimated: 47 mL/min — ABNORMAL LOW (ref >60)

## 2021-05-29 SURGERY — LOWER EXTREMITY ANGIOGRAPHY
Anesthesia: Moderate Sedation | Site: Leg Lower | Laterality: Left

## 2021-05-29 MED ORDER — METHYLPREDNISOLONE SODIUM SUCC 125 MG IJ SOLR: 125.0000 mg | Freq: Once | INTRAMUSCULAR | Status: DC | PRN

## 2021-05-29 MED ORDER — HEPARIN SODIUM (PORCINE) 1000 UNIT/ML IJ SOLN
INTRAMUSCULAR | Status: DC | PRN
Start: 2021-05-29 — End: 2021-05-29
  Administered 2021-05-29: 5000 [IU] via INTRAVENOUS

## 2021-05-29 MED ORDER — HYDROMORPHONE HCL 1 MG/ML IJ SOLN: 1.0000 mg | Freq: Once | INTRAMUSCULAR | Status: DC | PRN

## 2021-05-29 MED ORDER — CEFAZOLIN SODIUM-DEXTROSE 2-4 GM/100ML-% IV SOLN: 2.0000 g | Freq: Once | INTRAVENOUS | Status: AC

## 2021-05-29 MED ORDER — CEFAZOLIN SODIUM-DEXTROSE 2-4 GM/100ML-% IV SOLN
INTRAVENOUS | Status: AC
  Administered 2021-05-29: 2 g via INTRAVENOUS
  Filled 2021-05-29: qty 100

## 2021-05-29 MED ORDER — MIDAZOLAM HCL 2 MG/2ML IJ SOLN
INTRAMUSCULAR | Status: DC | PRN
  Administered 2021-05-29: 1 mg via INTRAVENOUS

## 2021-05-29 MED ORDER — HEPARIN SODIUM (PORCINE) 1000 UNIT/ML IJ SOLN
INTRAMUSCULAR | Status: AC
  Filled 2021-05-29: qty 1

## 2021-05-29 MED ORDER — FENTANYL CITRATE (PF) 100 MCG/2ML IJ SOLN
INTRAMUSCULAR | Status: AC
  Filled 2021-05-29: qty 2

## 2021-05-29 MED ORDER — DIPHENHYDRAMINE HCL 50 MG/ML IJ SOLN: 50.0000 mg | Freq: Once | INTRAMUSCULAR | Status: DC | PRN

## 2021-05-29 MED ORDER — FENTANYL CITRATE (PF) 100 MCG/2ML IJ SOLN
INTRAMUSCULAR | Status: DC | PRN
  Administered 2021-05-29: 50 ug via INTRAVENOUS

## 2021-05-29 MED ORDER — MIDAZOLAM HCL 2 MG/ML PO SYRP: 8.0000 mg | ORAL_SOLUTION | Freq: Once | ORAL | Status: DC | PRN

## 2021-05-29 MED ORDER — MIDAZOLAM HCL 5 MG/5ML IJ SOLN
INTRAMUSCULAR | Status: AC
  Filled 2021-05-29: qty 5

## 2021-05-29 MED ORDER — CLOPIDOGREL BISULFATE 75 MG PO TABS: 75.0000 mg | ORAL_TABLET | Freq: Every day | ORAL | 6 refills | Status: DC

## 2021-05-29 MED ORDER — SODIUM CHLORIDE 0.9 % IV SOLN
INTRAVENOUS | Status: DC
  Administered 2021-05-29: 75 mL via INTRAVENOUS

## 2021-05-29 MED ORDER — ONDANSETRON HCL 4 MG/2ML IJ SOLN: 4.0000 mg | Freq: Four times a day (QID) | INTRAMUSCULAR | Status: DC | PRN

## 2021-05-29 MED ORDER — IODIXANOL 320 MG/ML IV SOLN
INTRAVENOUS | Status: DC | PRN
  Administered 2021-05-29: 70 mL

## 2021-05-29 MED ORDER — FAMOTIDINE 20 MG PO TABS: 40.0000 mg | ORAL_TABLET | Freq: Once | ORAL | Status: DC | PRN

## 2021-05-29 SURGICAL SUPPLY — 22 items
BALLN LUTONIX 018 5X100X130 (BALLOONS) ×2
BALLN LUTONIX 018 5X300X130 (BALLOONS) ×2
BALLN ULTRVRSE 018 2.5X100X150 (BALLOONS) ×2
BALLOON LUTONIX 018 5X100X130 (BALLOONS) ×1 IMPLANT
BALLOON LUTONIX 018 5X300X130 (BALLOONS) ×1 IMPLANT
BALLOON ULTRVS 018 2.5X100X150 (BALLOONS) ×1 IMPLANT
CATH ANGIO 5F PIGTAIL 65CM (CATHETERS) ×2 IMPLANT
CATH ROTAREX 135 6FR (CATHETERS) ×2 IMPLANT
CATH VERT 5X100 (CATHETERS) ×2 IMPLANT
COVER PROBE U/S 5X48 (MISCELLANEOUS) ×2 IMPLANT
DEVICE STARCLOSE SE CLOSURE (Vascular Products) ×2 IMPLANT
GLIDEWIRE ADV .035X260CM (WIRE) ×2 IMPLANT
KIT ENCORE 26 ADVANTAGE (KITS) ×2 IMPLANT
PACK ANGIOGRAPHY (CUSTOM PROCEDURE TRAY) ×2 IMPLANT
SHEATH ANL2 6FRX45 HC (SHEATH) ×2 IMPLANT
SHEATH BRITE TIP 5FRX11 (SHEATH) ×2 IMPLANT
STENT VIABAHN 6X250X120 (Permanent Stent) ×2 IMPLANT
STENT VIABAHN 6X7.5X120 (Permanent Stent) ×2 IMPLANT
SYR MEDRAD MARK 7 150ML (SYRINGE) ×2 IMPLANT
TUBING CONTRAST HIGH PRESS 48 (TUBING) ×4 IMPLANT
WIRE G V18X300CM (WIRE) ×2 IMPLANT
WIRE GUIDERIGHT .035X150 (WIRE) ×2 IMPLANT

## 2021-05-29 NOTE — Op Note (Signed)
[facility]  Percutaneous Study/Intervention Procedural Note   Date of Surgery: 05/29/2021  Surgeon(s):Niquan Charnley    Assistants:none  Pre-operative Diagnosis: PAD with rest pain bilateral lower extremities  Post-operative diagnosis:  Same  Procedure(s) Performed:             1.  Ultrasound guidance for vascular access right femoral artery             2.  Catheter placement into left common femoral artery from right femoral approach             3.  Aortogram and selective left lower extremity angiogram             4.  Percutaneous transluminal angioplasty of left anterior tibial artery with 2.5 mm diameter angioplasty balloon             5.   Mechanical thrombectomy with the [place] Rex device to the left SFA and was proximal popliteal artery  6.  Percutaneous transluminal angioplasty of the left SFA and most proximal popliteal artery with 5 mm diameter by 30 cm length Lutonix drug-coated angioplasty balloon  7.  Viabahn stent placement x2 to the left SFA and was proximal popliteal artery with 6 mm diameter by 25 cm length and 6 mm diameter by 7.5 cm length stents             8.  StarClose closure device right femoral artery  EBL: 100 cc  Contrast: 75 cc  Fluoro Time: 8.8 minutes  Moderate Conscious Sedation Time: approximately 59 minutes using 1 mg of Versed and 50 mcg of Fentanyl              Indications:  Patient is a 87 y.o.female with severe pain in both lower extremities. The patient has noninvasive study showing largely reduced ABIs bilaterally. The patient is brought in for angiography for further evaluation and potential treatment.  Due to the limb threatening nature of the situation, angiogram was performed for attempted limb salvage. The patient is aware that if the procedure fails, amputation would be expected.  The patient also understands that even with successful revascularization, amputation may still be required due to the severity of the situation.   Risks and benefits are discussed and informed consent is obtained.   Procedure:  The patient was identified and appropriate procedural time out was performed.  The patient was then placed supine on the table and prepped and draped in the usual sterile fashion. Moderate conscious sedation was administered during a face to face encounter with the patient throughout the procedure with my supervision of the RN administering medicines and monitoring the patient's vital signs, pulse oximetry, telemetry and mental status throughout from the start of the procedure until the patient was taken to the recovery room. Ultrasound was used to evaluate the right common femoral artery.  It was patent .  A digital ultrasound image was acquired.  A Seldinger needle was used to access the right common femoral artery under direct ultrasound guidance and a permanent image was performed.  A 0.035 J wire was advanced without resistance and a 5Fr sheath was placed.  Pigtail catheter was placed into the aorta and an AP aortogram was performed. This demonstrated normal renal arteries and normal aorta and iliac segments without significant stenosis. I then crossed the aortic bifurcation and advanced to the left femoral head. Selective left lower extremity angiogram was then performed. This demonstrated occlusion of the left SFA just beyond its origin with  somewhat an abrupt occlusion.  This reconstituted at Hunter's canal with some moderate disease in the above-knee popliteal artery.  The mid and distal popliteal artery were fairly normal.  The tibial vessels had diffuse calcification in the proximal anterior tibial artery had a 90 to 95% stenosis a few centimeters beyond its origin, but then was continuous distally.  The peroneal and posterior tibial arteries were small but did not have obvious focal stenosis requiring treatment. It was felt that it was in the patient's best interest to proceed with intervention after these images to avoid  a second procedure and a larger amount of contrast and fluoroscopy based off of the findings from the initial angiogram. The patient was systemically heparinized and a 6 [place] Ansell sheath was then placed over the [employer] wire. I then used a Kumpe catheter and the advantage wire to cross the SFA occlusion with minimal difficulty with the wire tracking easily making is more concerning for a thrombotic or acute on chronic event.  I then advanced down into the proximal anterior tibial artery with a Kumpe catheter and exchanged for a V 18 wire crossing the stenosis without difficulty.  Due to the possible thrombotic nature of the SFA lesion, I elected to perform mechanical thrombectomy.  2 passes with the [place] Rex device were performed performing mechanical thrombectomy and left SFA and proximal popliteal artery.  Following this, this did clean up nicely but there were several areas of residual significant stenosis with some residual thrombus as well.  The anterior tibial artery was treated with a 2.5 mm diameter by 10 cm length angioplasty balloon inflated to 12 atm for 1 minute.  The left SFA and was proximal popliteal artery were treated with a 5 mm diameter by 30 cm length Lutonix drug-coated angioplasty balloon inflated to 12 atm for 1 minute.  Completion imaging showed about a 20% residual stenosis in the anterior tibial artery, but the SFA and was proximal popliteal artery had multiple areas of greater than 50% residual stenosis throughout the course.  I elected to treat these areas with stents and I used a covered stent particularly with the thrombotic nature of the lesion.  A 6 mm diameter by 25 cm length Viabahn stent was deployed and an additional 6 mm diameter by 7.5 cm length Viabahn stent was deployed to the proximal SFA just below its origin above the lesion and down into the proximal popliteal artery.  These were postdilated with 5 mm balloons with excellent angiographic ablation result and  less than 10% residual stenosis. I elected to terminate the procedure. The sheath was removed and StarClose closure device was deployed in the right femoral artery with excellent hemostatic result. The patient was taken to the recovery room in stable condition having tolerated the procedure well.  Findings:               Aortogram:  Normal renal arteries, normal aorta and iliac arteries without significant stenosis             Left lower Extremity:  This demonstrated occlusion of the left SFA just beyond its origin with somewhat an abrupt occlusion.  This reconstituted at Hunter's canal with some moderate disease in the above-knee popliteal artery.  The mid and distal popliteal artery were fairly normal.  The tibial vessels had diffuse calcification in the proximal anterior tibial artery had a 90 to 95% stenosis a few centimeters beyond its origin, but then was continuous distally.  The peroneal and posterior tibial  arteries were small but did not have obvious focal stenosis requiring treatment.   Disposition: Patient was taken to the recovery room in stable condition having tolerated the procedure well.  Complications: None  Leotis Pain 05/29/2021 9:35 AM   This note was created with Dragon Medical transcription system. Any errors in dictation are purely unintentional.

## 2021-05-29 NOTE — Discharge Instructions (Signed)
Femoral Site Care  This sheet gives you information about how to care for yourself after your procedure. Your health care provider may also give you more specific instructions. If you have problems or questions, contact your health care provider. What can I expect after the procedure? After the procedure, it is common to have:  Bruising that usually fades within 1-2 weeks.  Tenderness at the site. Follow these instructions at home: Wound care  Follow instructions from your health care provider about how to take care of your insertion site. Make sure you: ? Wash your hands with soap and water before you change your bandage (dressing). If soap and water are not available, use hand sanitizer. ? Change your dressing as told by your health care provider. ? Leave stitches (sutures), skin glue, or adhesive strips in place. These skin closures may need to stay in place for 2 weeks or longer. If adhesive strip edges start to loosen and curl up, you may trim the loose edges. Do not remove adhesive strips completely unless your health care provider tells you to do that.  Do not take baths, swim, or use a hot tub until your health care provider approves.  You may shower 24-48 hours after the procedure or as told by your health care provider. ? Gently wash the site with plain soap and water. ? Pat the area dry with a clean towel. ? Do not rub the site. This may cause bleeding.  Do not apply powder or lotion to the site. Keep the site clean and dry.  Check your femoral site every day for signs of infection. Check for: ? Redness, swelling, or pain. ? Fluid or blood. ? Warmth. ? Pus or a bad smell. Activity  For the first 2-3 days after your procedure, or as long as directed: ? Avoid climbing stairs as much as possible. ? Do not squat.  Do not lift anything that is heavier than 10 lb (4.5 kg), or the limit that you are told, until your health care provider says that it is safe.  Rest as  directed. ? Avoid sitting for a long time without moving. Get up to take short walks every 1-2 hours.  Do not drive for 24 hours if you were given a medicine to help you relax (sedative). General instructions  Take over-the-counter and prescription medicines only as told by your health care provider.  Keep all follow-up visits as told by your health care provider. This is important. Contact a health care provider if you have:  A fever or chills.  You have redness, swelling, or pain around your insertion site. Get help right away if:  The catheter insertion area swells very fast.  You pass out.  You suddenly start to sweat or your skin gets clammy.  The catheter insertion area is bleeding, and the bleeding does not stop when you hold steady pressure on the area.  The area near or just beyond the catheter insertion site becomes pale, cool, tingly, or numb. These symptoms may represent a serious problem that is an emergency. Do not wait to see if the symptoms will go away. Get medical help right away. Call your local emergency services (911 in the [place] Do not drive yourself to the hospital. Summary  After the procedure, it is common to have bruising that usually fades within 1-2 weeks.  Check your femoral site every day for signs of infection.  Do not lift anything that is heavier than 10 lb (4.5 kg), or   the limit that you are told, until your health care provider says that it is safe. This information is not intended to replace advice given to you by your health care provider. Make sure you discuss any questions you have with your health care provider. Document Revised: 08/16/2020 Document Reviewed: 08/16/2020 Elsevier Patient Education  2021 Elsevier Inc. Moderate Conscious Sedation, Adult, Care After This sheet gives you information about how to care for yourself after your procedure. Your health care provider may also give you more specific instructions. If you have problems  or questions, contact your health care provider. What can I expect after the procedure? After the procedure, it is common to have:  Sleepiness for several hours.  Impaired judgment for several hours.  Difficulty with balance.  Vomiting if you eat too soon. Follow these instructions at home: For the time period you were told by your health care provider:  Rest.  Do not participate in activities where you could fall or become injured.  Do not drive or use machinery.  Do not drink alcohol.  Do not take sleeping pills or medicines that cause drowsiness.  Do not make important decisions or sign legal documents.  Do not take care of children on your own.      Eating and drinking  Follow the diet recommended by your health care provider.  Drink enough fluid to keep your urine pale yellow.  If you vomit: ? Drink water, juice, or soup when you can drink without vomiting. ? Make sure you have little or no nausea before eating solid foods.   General instructions  Take over-the-counter and prescription medicines only as told by your health care provider.  Have a responsible adult stay with you for the time you are told. It is important to have someone help care for you until you are awake and alert.  Do not smoke.  Keep all follow-up visits as told by your health care provider. This is important. Contact a health care provider if:  You are still sleepy or having trouble with balance after 24 hours.  You feel light-headed.  You keep feeling nauseous or you keep vomiting.  You develop a rash.  You have a fever.  You have redness or swelling around the IV site. Get help right away if:  You have trouble breathing.  You have new-onset confusion at home. Summary  After the procedure, it is common to feel sleepy, have impaired judgment, or feel nauseous if you eat too soon.  Rest after you get home. Know the things you should not do after the procedure.  Follow the  diet recommended by your health care provider and drink enough fluid to keep your urine pale yellow.  Get help right away if you have trouble breathing or new-onset confusion at home. This information is not intended to replace advice given to you by your health care provider. Make sure you discuss any questions you have with your health care provider. Document Revised: 04/12/2020 Document Reviewed: 11/09/2019 Elsevier Patient Education  2021 Elsevier Inc.  

## 2021-05-29 NOTE — Interval H&P Note (Signed)
History and Physical Interval Note:  05/29/2021 8:18 AM  Heidi Whitaker  has presented today for surgery, with the diagnosis of RT Lower Extremity Angiography   ASO with rest pain   BARD Rep cc: Judi Cong.  The various methods of treatment have been discussed with the patient and family. After consideration of risks, benefits and other options for treatment, the patient has consented to  Procedure(s): LOWER EXTREMITY ANGIOGRAPHY (Left) as a surgical intervention.  The patient's history has been reviewed, patient examined, no change in status, stable for surgery.  I have reviewed the patient's chart and labs.  Questions were answered to the patient's satisfaction.     Leotis Pain

## 2021-05-30 ENCOUNTER — Ambulatory Visit: Payer: Medicare HMO | Admitting: Internal Medicine

## 2021-05-30 ENCOUNTER — Telehealth (INDEPENDENT_AMBULATORY_CARE_PROVIDER_SITE_OTHER): Payer: Self-pay

## 2021-05-30 NOTE — Telephone Encounter (Signed)
Pt's daughter called and left a VM on the nurses line saying that her mother had an angio performed yesterday and has been vomiting since this Am early and is in a lot of pain. I spoke with Dr. Lucky Cowboy that  Did the pt's surgery and he said that the pt can take Tylenol or Ib profen for the pain considering her age , also for the vomiting  This can be a result from the sudation used  An the pt can use Pepto or accessor.  I  Made the pt's daughter  Aware  Of  Dr. Bunnie Domino instructions and also if she feels the pt is not getting any better she should go to the ED.

## 2021-06-03 DIAGNOSIS — K56609 Unspecified intestinal obstruction, unspecified as to partial versus complete obstruction: Secondary | ICD-10-CM | POA: Diagnosis not present

## 2021-06-03 DIAGNOSIS — Z933 Colostomy status: Secondary | ICD-10-CM | POA: Diagnosis not present

## 2021-06-04 ENCOUNTER — Other Ambulatory Visit (INDEPENDENT_AMBULATORY_CARE_PROVIDER_SITE_OTHER): Payer: Self-pay | Admitting: Nurse Practitioner

## 2021-06-05 ENCOUNTER — Ambulatory Visit: Payer: Medicare HMO | Admitting: Dermatology

## 2021-06-05 ENCOUNTER — Encounter: Payer: Self-pay | Admitting: Vascular Surgery

## 2021-06-05 ENCOUNTER — Encounter
Admission: RE | Disposition: A | Discharge status: Home / Self Care | Payer: Self-pay | Source: Home / Self Care | Attending: Vascular Surgery

## 2021-06-05 ENCOUNTER — Ambulatory Visit
Admission: RE | Admit: 2021-06-05 | Discharge: 2021-06-05 | Disposition: A | Discharge status: Home / Self Care | Payer: Medicare HMO | Attending: Vascular Surgery | Admitting: Vascular Surgery

## 2021-06-05 DIAGNOSIS — Z79899 Other long term (current) drug therapy: Secondary | ICD-10-CM | POA: Diagnosis not present

## 2021-06-05 DIAGNOSIS — Z7984 Long term (current) use of oral hypoglycemic drugs: Secondary | ICD-10-CM | POA: Diagnosis not present

## 2021-06-05 DIAGNOSIS — E785 Hyperlipidemia, unspecified: Secondary | ICD-10-CM | POA: Diagnosis not present

## 2021-06-05 DIAGNOSIS — Z87891 Personal history of nicotine dependence: Secondary | ICD-10-CM | POA: Diagnosis not present

## 2021-06-05 DIAGNOSIS — N183 Chronic kidney disease, stage 3 unspecified: Secondary | ICD-10-CM | POA: Diagnosis not present

## 2021-06-05 DIAGNOSIS — Z7982 Long term (current) use of aspirin: Secondary | ICD-10-CM | POA: Insufficient documentation

## 2021-06-05 DIAGNOSIS — I70221 Atherosclerosis of native arteries of extremities with rest pain, right leg: Secondary | ICD-10-CM | POA: Diagnosis not present

## 2021-06-05 DIAGNOSIS — I7092 Chronic total occlusion of artery of the extremities: Secondary | ICD-10-CM | POA: Diagnosis not present

## 2021-06-05 DIAGNOSIS — E1122 Type 2 diabetes mellitus with diabetic chronic kidney disease: Secondary | ICD-10-CM | POA: Diagnosis not present

## 2021-06-05 DIAGNOSIS — I70223 Atherosclerosis of native arteries of extremities with rest pain, bilateral legs: Secondary | ICD-10-CM | POA: Diagnosis not present

## 2021-06-05 DIAGNOSIS — I129 Hypertensive chronic kidney disease with stage 1 through stage 4 chronic kidney disease, or unspecified chronic kidney disease: Secondary | ICD-10-CM | POA: Diagnosis not present

## 2021-06-05 DIAGNOSIS — E1151 Type 2 diabetes mellitus with diabetic peripheral angiopathy without gangrene: Secondary | ICD-10-CM | POA: Insufficient documentation

## 2021-06-05 DIAGNOSIS — I70229 Atherosclerosis of native arteries of extremities with rest pain, unspecified extremity: Secondary | ICD-10-CM

## 2021-06-05 HISTORY — DX: Other complications of anesthesia, initial encounter: T88.59XA

## 2021-06-05 HISTORY — PX: LOWER EXTREMITY ANGIOGRAPHY: CATH118251

## 2021-06-05 LAB — BUN: BUN: 16 mg/dL (ref 8–23)

## 2021-06-05 LAB — CREATININE, SERUM
Creatinine, Ser: 0.85 mg/dL (ref 0.44–1.00)
GFR, Estimated: >60 mL/min

## 2021-06-05 LAB — GLUCOSE, CAPILLARY: Glucose-Capillary: 97 mg/dL (ref 70–99)

## 2021-06-05 SURGERY — LOWER EXTREMITY ANGIOGRAPHY
Anesthesia: Moderate Sedation | Site: Leg Lower | Laterality: Right

## 2021-06-05 MED ORDER — DIPHENHYDRAMINE HCL 50 MG/ML IJ SOLN: 50.0000 mg | Freq: Once | INTRAMUSCULAR | Status: DC | PRN

## 2021-06-05 MED ORDER — CEFAZOLIN SODIUM-DEXTROSE 2-4 GM/100ML-% IV SOLN
2.0000 g | Freq: Once | INTRAVENOUS | Status: AC
  Administered 2021-06-05: 2 g via INTRAVENOUS

## 2021-06-05 MED ORDER — SODIUM CHLORIDE 0.9 % IV SOLN
INTRAVENOUS | Status: DC
  Administered 2021-06-05: 75 mL via INTRAVENOUS

## 2021-06-05 MED ORDER — IODIXANOL 320 MG/ML IV SOLN
INTRAVENOUS | Status: DC | PRN
  Administered 2021-06-05: 40 mL via INTRA_ARTERIAL

## 2021-06-05 MED ORDER — ONDANSETRON HCL 4 MG/2ML IJ SOLN: 4.0000 mg | Freq: Once | INTRAMUSCULAR | Status: DC

## 2021-06-05 MED ORDER — FENTANYL CITRATE (PF) 100 MCG/2ML IJ SOLN
INTRAMUSCULAR | Status: DC | PRN
  Administered 2021-06-05: 25 ug via INTRAVENOUS
  Administered 2021-06-05: 50 ug via INTRAVENOUS

## 2021-06-05 MED ORDER — MIDAZOLAM HCL 2 MG/2ML IJ SOLN
INTRAMUSCULAR | Status: DC | PRN
  Administered 2021-06-05: 0.5 mg via INTRAVENOUS
  Administered 2021-06-05: 1 mg via INTRAVENOUS

## 2021-06-05 MED ORDER — ONDANSETRON HCL 4 MG/2ML IJ SOLN
INTRAMUSCULAR | Status: AC
  Administered 2021-06-05: 4 mg via INTRAVENOUS
  Filled 2021-06-05: qty 2

## 2021-06-05 MED ORDER — FAMOTIDINE 20 MG PO TABS: 40.0000 mg | ORAL_TABLET | Freq: Once | ORAL | Status: DC | PRN

## 2021-06-05 MED ORDER — MIDAZOLAM HCL 5 MG/5ML IJ SOLN
INTRAMUSCULAR | Status: AC
  Filled 2021-06-05: qty 5

## 2021-06-05 MED ORDER — FENTANYL CITRATE (PF) 100 MCG/2ML IJ SOLN
INTRAMUSCULAR | Status: AC
  Filled 2021-06-05: qty 2

## 2021-06-05 MED ORDER — MIDAZOLAM HCL 2 MG/ML PO SYRP: 8.0000 mg | ORAL_SOLUTION | Freq: Once | ORAL | Status: DC | PRN

## 2021-06-05 MED ORDER — HYDROMORPHONE HCL 1 MG/ML IJ SOLN: 1.0000 mg | Freq: Once | INTRAMUSCULAR | Status: DC | PRN

## 2021-06-05 MED ORDER — HEPARIN SODIUM (PORCINE) 1000 UNIT/ML IJ SOLN
INTRAMUSCULAR | Status: AC
  Filled 2021-06-05: qty 1

## 2021-06-05 MED ORDER — METHYLPREDNISOLONE SODIUM SUCC 125 MG IJ SOLR: 125.0000 mg | Freq: Once | INTRAMUSCULAR | Status: DC | PRN

## 2021-06-05 MED ORDER — HEPARIN SODIUM (PORCINE) 1000 UNIT/ML IJ SOLN
INTRAMUSCULAR | Status: DC | PRN
  Administered 2021-06-05: 5000 [IU] via INTRAVENOUS

## 2021-06-05 MED ORDER — ONDANSETRON HCL 4 MG/2ML IJ SOLN: 4.0000 mg | Freq: Four times a day (QID) | INTRAMUSCULAR | Status: DC | PRN

## 2021-06-05 SURGICAL SUPPLY — 17 items
BALLN LUTONIX 5X220X130 (BALLOONS) ×4
BALLN ULTRVRSE 3X100X150 (BALLOONS) ×2
BALLOON LUTONIX 5X220X130 (BALLOONS) ×2 IMPLANT
BALLOON ULTRVRSE 3X100X150 (BALLOONS) ×1 IMPLANT
CATH BEACON 5 .038 100 VERT TP (CATHETERS) ×2 IMPLANT
CATH ROTAREX 135 6FR (CATHETERS) ×2 IMPLANT
CATH TEMPO 5F RIM 65CM (CATHETERS) ×2 IMPLANT
COVER PROBE U/S 5X48 (MISCELLANEOUS) ×2 IMPLANT
DEVICE STARCLOSE SE CLOSURE (Vascular Products) ×2 IMPLANT
GLIDEWIRE ADV .035X260CM (WIRE) ×4 IMPLANT
KIT ENCORE 26 ADVANTAGE (KITS) ×2 IMPLANT
PACK ANGIOGRAPHY (CUSTOM PROCEDURE TRAY) ×2 IMPLANT
SHEATH ANL2 6FRX45 HC (SHEATH) ×2 IMPLANT
SHEATH BRITE TIP 5FRX11 (SHEATH) ×2 IMPLANT
STENT VIABAHN 6X150X120 (Permanent Stent) ×2 IMPLANT
WIRE G V18X300CM (WIRE) ×2 IMPLANT
WIRE GUIDERIGHT .035X150 (WIRE) ×2 IMPLANT

## 2021-06-05 NOTE — Interval H&P Note (Signed)
History and Physical Interval Note:  06/05/2021 8:43 AM  Heidi Whitaker  has presented today for surgery, with the diagnosis of LLE Angiography   ASO with rest pain   BARD Rep   cc: Judi Cong.  The various methods of treatment have been discussed with the patient and family. After consideration of risks, benefits and other options for treatment, the patient has consented to  Procedure(s): LOWER EXTREMITY ANGIOGRAPHY (Right) as a surgical intervention.  The patient's history has been reviewed, patient examined, no change in status, stable for surgery.  I have reviewed the patient's chart and labs.  Questions were answered to the patient's satisfaction.     Leotis Pain

## 2021-06-05 NOTE — Op Note (Signed)
[facility]  Percutaneous Study/Intervention Procedural Note   Date of Surgery: 06/05/2021  Surgeon(s):Brandonlee Navis    Assistants:none  Pre-operative Diagnosis: PAD with claudication BLE  Post-operative diagnosis:  Same  Procedure(s) Performed:             1.  Ultrasound guidance for vascular access left femoral artery             2.  Catheter placement into right SFA from left femoral approach             3.  Selective right lower extremity angiogram             4.  Percutaneous transluminal angioplasty of right anterior tibial artery with 3 mm diameter by 10 cm length angioplasty balloon             5.  Mechanical thrombectomy to the right SFA with the [place] Rex device  6.  Percutaneous transluminal angioplasty of the right SFA with 5 mm diameter by 22 cm length Lutonix drug-coated angioplasty balloon  7.  Viabahn stent placement to the right SFA with 6 mm diameter by 15 cm length stent             8.  StarClose closure device left femoral artery  EBL: 5 cc  Contrast: 40 cc  Fluoro Time: 4.8 minutes  Moderate Conscious Sedation Time: approximately 42 minutes using 1.5 mg of Versed and 75 mcg of Fentanyl              Indications:  Patient is a 87 y.o.female with significant peripheral arterial disease already status post left lower extremity revascularization. The patient has noninvasive study showing significant reduction in flow bilaterally. The patient is brought in for angiography for further evaluation and potential treatment.  Risks and benefits are discussed and informed consent is obtained.   Procedure:  The patient was identified and appropriate procedural time out was performed.  The patient was then placed supine on the table and prepped and draped in the usual sterile fashion. Moderate conscious sedation was administered during a face to face encounter with the patient throughout the procedure with my supervision of the RN administering medicines and  monitoring the patient's vital signs, pulse oximetry, telemetry and mental status throughout from the start of the procedure until the patient was taken to the recovery room. Ultrasound was used to evaluate the left common femoral artery.  It was patent .  A digital ultrasound image was acquired.  A Seldinger needle was used to access the left common femoral artery under direct ultrasound guidance and a permanent image was performed.  A 0.035 J wire was advanced without resistance and a 5Fr sheath was placed.  Aortogram was not necessary as 1 was done recently.  I then crossed the aortic bifurcation and advanced to the right femoral head and then into the proximal right SFA. Selective right lower extremity angiogram was then performed. This demonstrated a fairly normal common femoral artery, profunda femoris artery, and proximal superficial femoral artery.  In the proximal to mid superficial femoral artery there was mild irregularity in the mid and mid to distal superficial femoral artery were high-grade near occlusive stenosis of greater than 90% that had irregularity and concern for chronic thrombus.  At [facility] canal, there was about a 70% stenosis.  The remainder of the popliteal artery then normalized down to a typical tibial trifurcation.  There is a small peroneal artery did not have any focal stenosis.  The  posterior tibial artery was chronically occluded.  The anterior tibial artery was the best runoff distally, but he did have a greater than 70% stenosis in the proximal segment about 5 to 6 cm beyond the origin.  It was then continuous to the foot. It was felt that it was in the patient's best interest to proceed with intervention after these images to avoid a second procedure and a larger amount of contrast and fluoroscopy based off of the findings from the initial angiogram. The patient was systemically heparinized and a 6 [place] Ansell sheath was then placed over the [employer] wire. I then used  a Kumpe catheter and the advantage wire to navigate through the SFA lesion and into the proximal anterior tibial artery.  I then exchanged for a V 18 wire and cross the stenosis in the anterior tibial artery without difficulty parking the wire at the level of the ankle.  I then proceeded with treatment.  The [place] Rex device was brought on the field and 2 passes were made with the [place] Rex device in the right SFA to debulk the chronic thrombus from the lesion.  Following thrombectomy, there was a significant reduction in thrombus but still several areas of greater than 70% stenosis in the right SFA.  This was treated with a 5 mm diameter by 22 cm length Lutonix drug-coated angioplasty balloon inflated to 12 atm for 1 minute.  I also addressed the anterior tibial artery with a 3 mm diameter by 10 cm length angioplasty balloon inflated to 14 atm for 1 minute the proximal anterior tibial artery.  Completion imaging showed several areas of greater than 50% stenosis in the right SFA that I elected to treat with a stent.  The anterior tibial artery had less than 10% residual stenosis.  A 6 mm diameter by 15 cm length Viabahn stent was then deployed in the right SFA to encompass the residual lesions.  This was postdilated with a 5 mm diameter Lutonix drug-coated balloon with excellent angiographic completion result and less than 10% residual stenosis. I elected to terminate the procedure. The sheath was removed and StarClose closure device was deployed in the left femoral artery with excellent hemostatic result. The patient was taken to the recovery room in stable condition having tolerated the procedure well.  Findings:                          Right lower Extremity: Fairly normal common femoral artery, profunda femoris artery, and proximal superficial femoral artery.  In the proximal to mid superficial femoral artery there was mild irregularity in the mid and mid to distal superficial femoral artery were high-grade  near occlusive stenosis of greater than 90% that had irregularity and concern for chronic thrombus.  At [facility] canal, there was about a 70% stenosis.  The remainder of the popliteal artery then normalized down to a typical tibial trifurcation.  There is a small peroneal artery did not have any focal stenosis.  The posterior tibial artery was chronically occluded.  The anterior tibial artery was the best runoff distally, but he did have a greater than 70% stenosis in the proximal segment about 5 to 6 cm beyond the origin.  It was then continuous to the foot.   Disposition: Patient was taken to the recovery room in stable condition having tolerated the procedure well.  Complications: None  Leotis Pain 06/05/2021 9:43 AM   This note was created with Dragon Medical transcription  system. Any errors in dictation are purely unintentional.

## 2021-06-06 ENCOUNTER — Other Ambulatory Visit: Payer: Self-pay | Admitting: Internal Medicine

## 2021-06-06 DIAGNOSIS — R600 Localized edema: Secondary | ICD-10-CM

## 2021-06-06 MED ORDER — FUROSEMIDE 20 MG PO TABS: 20.0000 mg | ORAL_TABLET | Freq: Every day | ORAL | 3 refills | Status: DC | PRN

## 2021-06-13 ENCOUNTER — Telehealth (INDEPENDENT_AMBULATORY_CARE_PROVIDER_SITE_OTHER): Payer: Self-pay

## 2021-06-13 ENCOUNTER — Other Ambulatory Visit (INDEPENDENT_AMBULATORY_CARE_PROVIDER_SITE_OTHER): Payer: Self-pay

## 2021-06-13 DIAGNOSIS — I739 Peripheral vascular disease, unspecified: Secondary | ICD-10-CM

## 2021-06-13 MED ORDER — CLOPIDOGREL BISULFATE 75 MG PO TABS: 75.0000 mg | ORAL_TABLET | Freq: Every day | ORAL | 5 refills | Status: DC

## 2021-06-13 NOTE — Telephone Encounter (Signed)
Patient daughter left a voicemail requesting for Clopidogrel to sent [facility] for 90 day supply. I left a message on patient daughter voicemail making her aware that prescription has been sent

## 2021-06-15 ENCOUNTER — Other Ambulatory Visit (INDEPENDENT_AMBULATORY_CARE_PROVIDER_SITE_OTHER): Payer: Self-pay | Admitting: Nurse Practitioner

## 2021-06-15 DIAGNOSIS — I739 Peripheral vascular disease, unspecified: Secondary | ICD-10-CM

## 2021-06-15 DIAGNOSIS — Z9862 Peripheral vascular angioplasty status: Secondary | ICD-10-CM

## 2021-06-18 ENCOUNTER — Encounter (INDEPENDENT_AMBULATORY_CARE_PROVIDER_SITE_OTHER): Payer: Self-pay

## 2021-06-18 ENCOUNTER — Ambulatory Visit (INDEPENDENT_AMBULATORY_CARE_PROVIDER_SITE_OTHER): Payer: Self-pay | Admitting: Nurse Practitioner

## 2021-06-19 DIAGNOSIS — H903 Sensorineural hearing loss, bilateral: Secondary | ICD-10-CM | POA: Diagnosis not present

## 2021-06-19 DIAGNOSIS — H6123 Impacted cerumen, bilateral: Secondary | ICD-10-CM | POA: Diagnosis not present

## 2021-06-25 ENCOUNTER — Ambulatory Visit (INDEPENDENT_AMBULATORY_CARE_PROVIDER_SITE_OTHER): Payer: Medicare HMO

## 2021-06-25 ENCOUNTER — Ambulatory Visit (INDEPENDENT_AMBULATORY_CARE_PROVIDER_SITE_OTHER): Payer: Medicare HMO | Admitting: Nurse Practitioner

## 2021-06-25 ENCOUNTER — Other Ambulatory Visit: Payer: Self-pay

## 2021-06-25 ENCOUNTER — Encounter (INDEPENDENT_AMBULATORY_CARE_PROVIDER_SITE_OTHER): Payer: Self-pay | Admitting: Nurse Practitioner

## 2021-06-25 VITALS — BP 148/80 | HR 71 | Ht 62.0 in | Wt 175.0 lb

## 2021-06-25 DIAGNOSIS — E1169 Type 2 diabetes mellitus with other specified complication: Secondary | ICD-10-CM | POA: Diagnosis not present

## 2021-06-25 DIAGNOSIS — Z9862 Peripheral vascular angioplasty status: Secondary | ICD-10-CM

## 2021-06-25 DIAGNOSIS — E669 Obesity, unspecified: Secondary | ICD-10-CM

## 2021-06-25 DIAGNOSIS — I739 Peripheral vascular disease, unspecified: Secondary | ICD-10-CM

## 2021-06-25 DIAGNOSIS — I10 Essential (primary) hypertension: Secondary | ICD-10-CM

## 2021-06-25 DIAGNOSIS — E785 Hyperlipidemia, unspecified: Secondary | ICD-10-CM | POA: Diagnosis not present

## 2021-06-25 NOTE — Progress Notes (Signed)
Subjective:    Patient ID: Heidi Whitaker, female    DOB: 1929-02-28, 87 y.o.   MRN: [id number] Chief Complaint  Patient presents with   Follow-up    4wk armc post LE angio BIL arterial abi    Heidi Whitaker is an 87 year old female that returns to the office for followup and review status post angiogram with intervention. The patient notes improvement in the lower extremity symptoms. No interval shortening of the patient's claudication distance or rest pain symptoms. No new ulcers or wounds have occurred since the last visit.  There have been no significant changes to the patient's overall health care.  The patient denies amaurosis fugax or recent TIA symptoms. There are no recent neurological changes noted. The patient denies history of DVT, PE or superficial thrombophlebitis. The patient denies recent episodes of angina or shortness of breath.   ABI's Rt=1.12 and Lt=0.99  (previous ABI's Rt=0.38 and Lt=0.48) Duplex US of the bilateral lower extremities show patent stents as well as triphasic waveforms from the level of the common femoral artery down to the tibial arteries.  The patient has good toe waveforms bilaterally.   Review of Systems  Cardiovascular:  Negative for leg swelling.  Musculoskeletal:  Positive for arthralgias.  All other systems reviewed and are negative.     Objective:   Physical Exam Vitals reviewed.  HENT:     Head: Normocephalic.  Cardiovascular:     Rate and Rhythm: Normal rate.     Pulses: Normal pulses.  Pulmonary:     Effort: Pulmonary effort is normal.  Skin:    General: Skin is warm and dry.     Capillary Refill: Capillary refill takes less than 2 seconds.  Neurological:     Mental Status: She is alert and oriented to person, place, and time.  Psychiatric:        Mood and Affect: Mood normal.        Behavior: Behavior normal.        Thought Content: Thought content normal.        Judgment: Judgment normal.    BP (!) 148/80    Pulse 71   Ht 5\' 2"  (1.575 m)   Wt 175 lb (79.4 kg)   BMI 32.01 kg/m   Past Medical History:  Diagnosis Date   Complication of anesthesia    Diabetes mellitus without complication ([place])    DVT (deep venous thrombosis) ([place])    06/02/2019   Hard of hearing    Hypertension     Social History   Socioeconomic History   Marital status: Widowed    Spouse name: Not on file   Number of children: Not on file   Years of education: Not on file   Highest education level: Not on file  Occupational History   Not on file  Tobacco Use   Smoking status: Former    Years: 4.00    Pack years: 0.00    Types: Cigarettes   Smokeless tobacco: Never   Tobacco comments:    3 per day  Vaping Use   Vaping Use: Never used  Substance and Sexual Activity   Alcohol use: No   Drug use: No   Sexual activity: Not Currently  Other Topics Concern   Not on file  Social History Narrative   Retired Pharmacist, hospital    2 daughters lives with [name]    1 son deceased    Widowed    Moved from [place] Determinants of  Health   Financial Resource Strain: Low Risk    Difficulty of Paying Living Expenses: Not hard at all  Food Insecurity: No Food Insecurity   Worried About [place] in the Last Year: Never true   Ran Out of Food in the Last Year: Never true  Transportation Needs: No Transportation Needs   Lack of Transportation (Medical): No   Lack of Transportation (Non-Medical): No  Physical Activity: Insufficiently Active   Days of Exercise per Week: 3 days   Minutes of Exercise per Session: 20 min  Stress: No Stress Concern Present   Feeling of Stress : Not at all  Social Connections: Unknown   Frequency of Communication with Friends and Family: More than three times a week   Frequency of Social Gatherings with Friends and Family: More than three times a week   Attends Religious Services: Not on file   Active Member of Clubs or Organizations: Not on file   Attends Archivist  Meetings: Not on file   Marital Status: Not on file  Intimate Partner Violence: Not At Risk   Fear of Current or Ex-Partner: No   Emotionally Abused: No   Physically Abused: No   Sexually Abused: No    Past Surgical History:  Procedure Laterality Date   CESAREAN SECTION     COLONOSCOPY N/A 05/17/2019   Procedure: FLEXIBLE SIGMOIDOSCOPY;  Surgeon: Ileana Roup, MD;  Location: [facility];  Service: General;  Laterality: N/A;   COLONOSCOPY WITH PROPOFOL N/A 01/09/2019   Procedure: COLONOSCOPY WITH PROPOFOL;  Surgeon: Lin Landsman, MD;  Location: [facility] ENDOSCOPY;  Service: Gastroenterology;  Laterality: N/A;   ESOPHAGOGASTRODUODENOSCOPY (EGD) WITH PROPOFOL N/A 01/09/2019   Procedure: ESOPHAGOGASTRODUODENOSCOPY (EGD) WITH PROPOFOL;  Surgeon: Lin Landsman, MD;  Location: [facility] ENDOSCOPY;  Service: Gastroenterology;  Laterality: N/A;   KNEE SURGERY     left 2017/2018   LOWER EXTREMITY ANGIOGRAPHY Left 05/29/2021   Procedure: LOWER EXTREMITY ANGIOGRAPHY;  Surgeon: Algernon Huxley, MD;  Location: [place] [facility];  Service: Cardiovascular;  Laterality: Left;   LOWER EXTREMITY ANGIOGRAPHY Right 06/05/2021   Procedure: LOWER EXTREMITY ANGIOGRAPHY;  Surgeon: Algernon Huxley, MD;  Location: [place] [facility];  Service: Cardiovascular;  Laterality: Right;   SHOULDER SURGERY     right   XI ROBOTIC ASSISTED LOWER ANTERIOR RESECTION N/A 05/17/2019   Procedure: XI ROBOTIC ASSISTED LOWER ANTERIOR RESECTION WITH END COLOSTOMY;  Surgeon: Ileana Roup, MD;  Location: [facility];  Service: General;  Laterality: N/A;    Family History  Problem Relation Age of Onset   Addison's disease Maternal Aunt    Heart disease Mother    Heart disease Father     Allergies  Allergen Reactions   Metformin And Related     Diarrhea     CBC Latest Ref Rng & Units 05/21/2021 05/16/2021 05/15/2021  WBC 4.0 - 10.5 K/uL 10.7(H) 11.3(H) 11.9(H)  Hemoglobin 12.0 - 15.0 g/dL 13.5 12.9 13.3  Hematocrit 36.0 -  46.0 % 40.1 38.3 40.5  Platelets 150.0 - 400.0 K/uL 259.0 231 239      CMP     Component Value Date/Time   NA 139 05/21/2021 0900   K 4.6 05/21/2021 0900   CL 104 05/21/2021 0900   CO2 29 05/21/2021 0900   GLUCOSE 103 (H) 05/21/2021 0900   BUN 16 06/05/2021 0739   CREATININE 0.85 06/05/2021 0739   CALCIUM 10.0 05/21/2021 0900   PROT 6.7 05/21/2021 0900  ALBUMIN 4.0 05/21/2021 0900   AST 16 05/21/2021 0900   ALT 11 05/21/2021 0900   ALKPHOS 47 05/21/2021 0900   BILITOT 0.7 05/21/2021 0900   GFRNONAA >60 06/05/2021 0739   GFRAA >60 06/02/2019 2009     No results found.     Assessment & Plan:   1. PAD (peripheral artery disease) (HCC) Recommend:  The patient is status post successful angiogram with intervention.  The patient reports that the claudication symptoms and leg pain is essentially gone.   The patient denies lifestyle limiting changes at this point in time.  No further invasive studies, angiography or surgery at this time The patient should continue walking and begin a more formal exercise program.  The patient should continue antiplatelet therapy and aggressive treatment of the lipid abnormalities  Patient should undergo noninvasive studies as ordered in 3 months The patient will follow up with me after the studies.    2. Hyperlipidemia, unspecified hyperlipidemia type Continue statin as ordered and reviewed, no changes at this time   3. Hypertension, unspecified type Continue antihypertensive medications as already ordered, these medications have been reviewed and there are no changes at this time.   4. Diabetes mellitus type 2 in obese ([place]) Continue hypoglycemic medications as already ordered, these medications have been reviewed and there are no changes at this time.  Hgb A1C to be monitored as already arranged by primary service    Current Outpatient Medications on File Prior to Visit  Medication Sig Dispense Refill   aspirin 81 MG chewable  tablet Chew 81 mg by mouth daily.     clopidogrel (PLAVIX) 75 MG tablet Take 1 tablet (75 mg total) by mouth daily. 90 tablet 5   furosemide (LASIX) 20 MG tablet Take 1 tablet (20 mg total) by mouth daily as needed. 90 tablet 3   glimepiride (AMARYL) 2 MG tablet Take 1 tablet (2 mg total) by mouth daily with breakfast. 90 tablet 3   lisinopril (ZESTRIL) 2.5 MG tablet Take 1 tablet (2.5 mg total) by mouth daily. 90 tablet 3   lovastatin (MEVACOR) 20 MG tablet Take 1 tablet (20 mg total) by mouth daily at 6 PM. 90 tablet 3   metoprolol succinate (TOPROL XL) 25 MG 24 hr tablet Take 1 tablet (25 mg total) by mouth daily. 90 tablet 3   mupirocin ointment (BACTROBAN) 2 % Apply 1 application topically 2 (two) times daily. 30 g 0   No current facility-administered medications on file prior to visit.    There are no Patient Instructions on file for this visit. No follow-ups on file.   Kris Hartmann, NP

## 2021-06-26 ENCOUNTER — Other Ambulatory Visit: Payer: Medicare HMO

## 2021-07-02 DIAGNOSIS — K56609 Unspecified intestinal obstruction, unspecified as to partial versus complete obstruction: Secondary | ICD-10-CM | POA: Diagnosis not present

## 2021-07-02 DIAGNOSIS — Z933 Colostomy status: Secondary | ICD-10-CM | POA: Diagnosis not present

## 2021-07-27 ENCOUNTER — Telehealth: Payer: Self-pay | Admitting: Internal Medicine

## 2021-07-27 NOTE — Telephone Encounter (Signed)
Call daughter has pt had eye exam? If so need to get records where? And if not please schedule eye exam with h/o diabetes 2  If she does not have eye MD refer to [facility] please   Thank you

## 2021-07-28 NOTE — Telephone Encounter (Signed)
Patient will be going to see [facility] this month. The daughter will inform them that Dr Olivia Mackie McLean-Scocuzza is her PCP and will have them fax over the results.

## 2021-08-01 DIAGNOSIS — Z933 Colostomy status: Secondary | ICD-10-CM | POA: Diagnosis not present

## 2021-08-01 DIAGNOSIS — K56609 Unspecified intestinal obstruction, unspecified as to partial versus complete obstruction: Secondary | ICD-10-CM | POA: Diagnosis not present

## 2021-08-26 ENCOUNTER — Telehealth (INDEPENDENT_AMBULATORY_CARE_PROVIDER_SITE_OTHER): Payer: Self-pay

## 2021-08-26 NOTE — Telephone Encounter (Signed)
Pt's daughter called and left a VM on the nurse line saying that she is concerned with the bleeding and bruises her mom is having on her blood thinner . I called to get more information but it rang to a doctors office. Please advise.

## 2021-08-26 NOTE — Telephone Encounter (Signed)
The patient will need to remain on her blood thinners until at least her follow up visit and then we can discuss.  This is because she had intervention to her arteries and she needs to be on them for a minimum time to prevent re-stenosis

## 2021-08-27 NOTE — Telephone Encounter (Signed)
I called the pt's daughter and made her aware of the NP's instructions.

## 2021-08-29 ENCOUNTER — Telehealth: Payer: Self-pay

## 2021-08-29 NOTE — Telephone Encounter (Signed)
Confirmed fax [employer] care consideration. Form has been sent to scan.

## 2021-09-03 DIAGNOSIS — Z933 Colostomy status: Secondary | ICD-10-CM | POA: Diagnosis not present

## 2021-09-03 DIAGNOSIS — K56609 Unspecified intestinal obstruction, unspecified as to partial versus complete obstruction: Secondary | ICD-10-CM | POA: Diagnosis not present

## 2021-09-04 ENCOUNTER — Other Ambulatory Visit: Payer: Self-pay

## 2021-09-04 MED ORDER — METOPROLOL SUCCINATE ER 25 MG PO TB24: 25.0000 mg | ORAL_TABLET | Freq: Every day | ORAL | 3 refills | Status: DC

## 2021-09-04 MED ORDER — METOPROLOL SUCCINATE ER 25 MG PO TB24: 25.0000 mg | ORAL_TABLET | Freq: Every day | ORAL | 0 refills | Status: DC

## 2021-09-22 ENCOUNTER — Other Ambulatory Visit (INDEPENDENT_AMBULATORY_CARE_PROVIDER_SITE_OTHER): Payer: Self-pay | Admitting: Nurse Practitioner

## 2021-09-22 DIAGNOSIS — I739 Peripheral vascular disease, unspecified: Secondary | ICD-10-CM

## 2021-09-24 ENCOUNTER — Other Ambulatory Visit: Payer: Self-pay

## 2021-09-24 ENCOUNTER — Ambulatory Visit (INDEPENDENT_AMBULATORY_CARE_PROVIDER_SITE_OTHER): Payer: Medicare HMO

## 2021-09-24 ENCOUNTER — Ambulatory Visit (INDEPENDENT_AMBULATORY_CARE_PROVIDER_SITE_OTHER): Payer: Medicare HMO | Admitting: Nurse Practitioner

## 2021-09-24 ENCOUNTER — Encounter (INDEPENDENT_AMBULATORY_CARE_PROVIDER_SITE_OTHER): Payer: Self-pay | Admitting: Nurse Practitioner

## 2021-09-24 VITALS — BP 134/73 | HR 68 | Resp 16 | Wt 181.6 lb

## 2021-09-24 DIAGNOSIS — E1159 Type 2 diabetes mellitus with other circulatory complications: Secondary | ICD-10-CM | POA: Diagnosis not present

## 2021-09-24 DIAGNOSIS — E1169 Type 2 diabetes mellitus with other specified complication: Secondary | ICD-10-CM

## 2021-09-24 DIAGNOSIS — I152 Hypertension secondary to endocrine disorders: Secondary | ICD-10-CM

## 2021-09-24 DIAGNOSIS — E669 Obesity, unspecified: Secondary | ICD-10-CM | POA: Diagnosis not present

## 2021-09-24 DIAGNOSIS — I739 Peripheral vascular disease, unspecified: Secondary | ICD-10-CM | POA: Diagnosis not present

## 2021-09-26 DIAGNOSIS — Z933 Colostomy status: Secondary | ICD-10-CM | POA: Diagnosis not present

## 2021-09-26 DIAGNOSIS — K56609 Unspecified intestinal obstruction, unspecified as to partial versus complete obstruction: Secondary | ICD-10-CM | POA: Diagnosis not present

## 2021-10-05 ENCOUNTER — Encounter (INDEPENDENT_AMBULATORY_CARE_PROVIDER_SITE_OTHER): Payer: Self-pay | Admitting: Nurse Practitioner

## 2021-10-05 NOTE — Progress Notes (Signed)
Subjective:    Patient ID: Heidi Whitaker, female    DOB: [birth year], 87 y.o.   MRN: [id number] Chief Complaint  Patient presents with   Follow-up    Ultrasound follow up    Heidi Whitaker 87 year old female that returns to the office for followup and review of the noninvasive studies. There have been no interval changes in lower extremity symptoms. No interval shortening of the patient's claudication distance or development of rest pain symptoms. No new ulcers or wounds have occurred since the last visit.  There have been no significant changes to the patient's overall health care.  The patient denies amaurosis fugax or recent TIA symptoms. There are no recent neurological changes noted. The patient denies history of DVT, PE or superficial thrombophlebitis. The patient denies recent episodes of angina or shortness of breath.   ABI Rt=1.05 and Lt=1.19  (previous ABI's Rt=1.12 and Lt=0.99) Duplex ultrasound of the right tibial arteries reveals patent stent with biphasic waveforms throughout.  The left lower extremity also has a patent stent with biphasic waveforms throughout.  Patient has good toe waveforms bilaterally.   Review of Systems  Skin:  Negative for wound.  Neurological:  Negative for weakness.  All other systems reviewed and are negative.     Objective:   Physical Exam Vitals reviewed.  HENT:     Head: Normocephalic.  Cardiovascular:     Rate and Rhythm: Normal rate.     Pulses: Normal pulses.  Pulmonary:     Effort: Pulmonary effort is normal.  Skin:    General: Skin is warm and dry.  Neurological:     Mental Status: She is alert and oriented to person, place, and time.  Psychiatric:        Mood and Affect: Mood normal.        Behavior: Behavior normal.        Thought Content: Thought content normal.        Judgment: Judgment normal.    BP 134/73 (BP Location: Right Arm)   Pulse 68   Resp 16   Wt 181 lb 9.6 oz (82.4 kg)   BMI 33.22 kg/m    Past Medical History:  Diagnosis Date   Complication of anesthesia    Diabetes mellitus without complication ([place])    DVT (deep venous thrombosis) ([place])    06/02/2019   Hard of hearing    Hypertension     Social History   Socioeconomic History   Marital status: Widowed    Spouse name: Not on file   Number of children: Not on file   Years of education: Not on file   Highest education level: Not on file  Occupational History   Not on file  Tobacco Use   Smoking status: Former    Years: 4.00    Types: Cigarettes   Smokeless tobacco: Never   Tobacco comments:    3 per day  Vaping Use   Vaping Use: Never used  Substance and Sexual Activity   Alcohol use: No   Drug use: No   Sexual activity: Not Currently  Other Topics Concern   Not on file  Social History Narrative   Retired Pharmacist, hospital    2 daughters lives with [name]    1 son deceased    Widowed    Moved from [place] Resource Strain: Low Risk    Difficulty of Paying Living Expenses: Not hard at all  Food Insecurity: No  Food Insecurity   Worried About Char[employer] fundraiser in the Last Year: Never true   Ran Out of Food in the Last Year: Never true  Transportation Needs: No Transportation Needs   Lack of Transportation (Medical): No   Lack of Transportation (Non-Medical): No  Physical Activity: Insufficiently Active   Days of Exercise per Week: 3 days   Minutes of Exercise per Session: 20 min  Stress: No Stress Concern Present   Feeling of Stress : Not at all  Social Connections: Unknown   Frequency of Communication with Friends and Family: More than three times a week   Frequency of Social Gatherings with Friends and Family: More than three times a week   Attends Religious Services: Not on file   Active Member of Clubs or Organizations: Not on file   Attends Archivist Meetings: Not on file   Marital Status: Not on file  Intimate Partner Violence: Not At Risk    Fear of Current or Ex-Partner: No   Emotionally Abused: No   Physically Abused: No   Sexually Abused: No    Past Surgical History:  Procedure Laterality Date   CESAREAN SECTION     COLONOSCOPY N/A 05/17/2019   Procedure: FLEXIBLE SIGMOIDOSCOPY;  Surgeon: Ileana Roup, MD;  [facility];  Service: General;  Laterality: N/A;   COLONOSCOPY WITH PROPOFOL N/A 01/09/2019   Procedure: COLONOSCOPY WITH PROPOFOL;  Surgeon: Lin Landsman, MD;  Location: [facility] ENDOSCOPY;  Service: Gastroenterology;  Laterality: N/A;   ESOPHAGOGASTRODUODENOSCOPY (EGD) WITH PROPOFOL N/A 01/09/2019   Procedure: ESOPHAGOGASTRODUODENOSCOPY (EGD) WITH PROPOFOL;  Surgeon: Lin Landsman, MD;  Location: [facility] ENDOSCOPY;  Service: Gastroenterology;  Laterality: N/A;   KNEE SURGERY     left 2017/2018   LOWER EXTREMITY ANGIOGRAPHY Left 05/29/2021   Procedure: LOWER EXTREMITY ANGIOGRAPHY;  Surgeon: Algernon Huxley, MD;  Location: [place];  Service: Cardiovascular;  Laterality: Left;   LOWER EXTREMITY ANGIOGRAPHY Right 06/05/2021   Procedure: LOWER EXTREMITY ANGIOGRAPHY;  Surgeon: Algernon Huxley, MD;  Location: [facility];  Service: Cardiovascular;  Laterality: Right;   SHOULDER SURGERY     right   XI ROBOTIC ASSISTED LOWER ANTERIOR RESECTION N/A 05/17/2019   Procedure: XI ROBOTIC ASSISTED LOWER ANTERIOR RESECTION WITH END COLOSTOMY;  Surgeon: Ileana Roup, MD;  [facility];  Service: General;  Laterality: N/A;    Family History  Problem Relation Age of Onset   Addison's disease Maternal Aunt    Heart disease Mother    Heart disease Father     Allergies  Allergen Reactions   Metformin And Related     Diarrhea     CBC Latest Ref Rng & Units 05/21/2021 05/16/2021 05/15/2021  WBC 4.0 - 10.5 K/uL 10.7(H) 11.3(H) 11.9(H)  Hemoglobin 12.0 - 15.0 g/dL 13.5 12.9 13.3  Hematocrit 36.0 - 46.0 % 40.1 38.3 40.5  Platelets 150.0 - 400.0 K/uL 259.0 231 239      CMP     Component  Value Date/Time   NA 139 05/21/2021 0900   K 4.6 05/21/2021 0900   CL 104 05/21/2021 0900   CO2 29 05/21/2021 0900   GLUCOSE 103 (H) 05/21/2021 0900   BUN 16 06/05/2021 0739   CREATININE 0.85 06/05/2021 0739   CALCIUM 10.0 05/21/2021 0900   PROT 6.7 05/21/2021 0900   ALBUMIN 4.0 05/21/2021 0900   AST 16 05/21/2021 0900   ALT 11 05/21/2021 0900   ALKPHOS 47 05/21/2021 0900  BILITOT 0.7 05/21/2021 0900   GFRNONAA >60 06/05/2021 0739   GFRAA >60 06/02/2019 2009     VAS [place] ABI WITH/WO TBI  Result Date: 09/26/2021  LOWER EXTREMITY DOPPLER STUDY Patient Name:  YUVIA PLANT  Date of Exam:   09/24/2021 Medical Rec #: [id number]            Accession #:    [id number] Date of Birth: April 12, 1929            Patient Gender: F Patient Age:   80 years Exam [facility] Procedure:      VAS [place] ABI WITH/WO TBI Referring Phys: Eulogio Ditch --------------------------------------------------------------------------------  Indications: Peripheral artery disease.  Vascular Interventions: 05/29/2021 PTA of Lt ATA. Lt SFA and proximal SFA                         thrombectomy. PTA and stent Lt SFA and proximal                         popliteal.                         06/05/2021 PTA of Rt ATA. Rt thrombectomy Rt SFA. PTA Rt                         SFA with stent. Performing Technologist: Concha Norway RVT  Examination Guidelines: A complete evaluation includes at minimum, Doppler waveform signals and systolic blood pressure reading at the level of bilateral brachial, anterior tibial, and posterior tibial arteries, when vessel segments are accessible. Bilateral testing is considered an integral part of a complete examination. Photoelectric Plethysmograph (PPG) waveforms and toe systolic pressure readings are included as required and additional duplex testing as needed. Limited examinations for reoccurring indications may be performed as noted.  ABI Findings:  +---------+------------------+-----+---------+--------+ Right    Rt Pressure (mmHg)IndexWaveform Comment  +---------+------------------+-----+---------+--------+ Brachial 118                                      +---------+------------------+-----+---------+--------+ ATA      144                    triphasic1.20     +---------+------------------+-----+---------+--------+ PTA      126               1.05 biphasic          +---------+------------------+-----+---------+--------+ Great Toe108               0.90 Normal            +---------+------------------+-----+---------+--------+ +---------+------------------+-----+---------+-------+ Left     Lt Pressure (mmHg)IndexWaveform Comment +---------+------------------+-----+---------+-------+ Brachial 120                                     +---------+------------------+-----+---------+-------+ ATA      138                    biphasic 1.11    +---------+------------------+-----+---------+-------+ PTA      143               1.19 triphasic        +---------+------------------+-----+---------+-------+ Orma Render  0.88 Normal           +---------+------------------+-----+---------+-------+ +-------+-----------+-----------+------------+------------+ ABI/TBIToday's ABIToday's TBIPrevious ABIPrevious TBI +-------+-----------+-----------+------------+------------+ Right  1.05       .90        1.12        .89          +-------+-----------+-----------+------------+------------+ Left   1.19       .88        .99         .80          +-------+-----------+-----------+------------+------------+  Left ABIs appear decreased compared to prior study on 06/25/2021.  Summary: Right: Resting right ankle-brachial index is within normal range. No evidence of significant right lower extremity arterial disease. The right toe-brachial index is normal. Left: Resting left ankle-brachial index is within normal  range. No evidence of significant left lower extremity arterial disease. The left toe-brachial index is normal.  *See table(s) above for measurements and observations.  Electronically signed by Leotis Pain MD on 09/26/2021 at 9:05:39 AM.    Final        Assessment & Plan:   1. PAD (peripheral artery disease) (HCC)  Recommend:  The patient has evidence of atherosclerosis of the lower extremities with claudication.  The patient does not voice lifestyle limiting changes at this point in time.  Noninvasive studies do not suggest clinically significant change.  No invasive studies, angiography or surgery at this time The patient should continue walking and begin a more formal exercise program.  The patient should continue antiplatelet therapy and aggressive treatment of the lipid abnormalities  No changes in the patient's medications at this time  The patient should continue wearing graduated compression socks 10-15 mmHg strength to control the mild edema.    2. Hypertension associated with diabetes ([place]) Continue antihypertensive medications as already ordered, these medications have been reviewed and there are no changes at this time.   3. Diabetes mellitus type 2 in obese [facility]) Continue hypoglycemic medications as already ordered, these medications have been reviewed and there are no changes at this time.  Hgb A1C to be monitored as already arranged by primary service    Current Outpatient Medications on File Prior to Visit  Medication Sig Dispense Refill   aspirin 81 MG chewable tablet Chew 81 mg by mouth daily.     clopidogrel (PLAVIX) 75 MG tablet Take 1 tablet (75 mg total) by mouth daily. 90 tablet 5   furosemide (LASIX) 20 MG tablet Take 1 tablet (20 mg total) by mouth daily as needed. 90 tablet 3   glimepiride (AMARYL) 2 MG tablet Take 1 tablet (2 mg total) by mouth daily with breakfast. 90 tablet 3   lisinopril (ZESTRIL) 2.5 MG tablet Take 1 tablet (2.5 mg total) by mouth  daily. 90 tablet 3   lovastatin (MEVACOR) 20 MG tablet Take 1 tablet (20 mg total) by mouth daily at 6 PM. 90 tablet 3   metoprolol succinate (TOPROL XL) 25 MG 24 hr tablet Take 1 tablet (25 mg total) by mouth daily. 90 tablet 3   metoprolol succinate (TOPROL-XL) 25 MG 24 hr tablet Take 1 tablet (25 mg total) by mouth daily. 30 tablet 0   mupirocin ointment (BACTROBAN) 2 % Apply 1 application topically 2 (two) times daily. 30 g 0   No current facility-administered medications on file prior to visit.    There are no Patient Instructions on file for this visit. No follow-ups on file.   Kris Hartmann, NP

## 2021-10-13 DIAGNOSIS — E109 Type 1 diabetes mellitus without complications: Secondary | ICD-10-CM | POA: Diagnosis not present

## 2021-10-13 DIAGNOSIS — I10 Essential (primary) hypertension: Secondary | ICD-10-CM | POA: Diagnosis not present

## 2021-10-13 DIAGNOSIS — H52229 Regular astigmatism, unspecified eye: Secondary | ICD-10-CM | POA: Diagnosis not present

## 2021-10-24 DIAGNOSIS — Z933 Colostomy status: Secondary | ICD-10-CM | POA: Diagnosis not present

## 2021-10-24 DIAGNOSIS — K56609 Unspecified intestinal obstruction, unspecified as to partial versus complete obstruction: Secondary | ICD-10-CM | POA: Diagnosis not present

## 2021-10-31 ENCOUNTER — Encounter: Payer: Self-pay | Admitting: Emergency Medicine

## 2021-10-31 ENCOUNTER — Inpatient Hospital Stay
Admission: EM | Admit: 2021-10-31 | Discharge: 2021-11-04 | DRG: 291 | Disposition: A | Discharge status: Home Health | Payer: Medicare HMO | Attending: Internal Medicine | Admitting: Internal Medicine

## 2021-10-31 ENCOUNTER — Other Ambulatory Visit: Payer: Self-pay

## 2021-10-31 ENCOUNTER — Emergency Department: Payer: Medicare HMO

## 2021-10-31 DIAGNOSIS — N1831 Chronic kidney disease, stage 3a: Secondary | ICD-10-CM | POA: Diagnosis not present

## 2021-10-31 DIAGNOSIS — Z933 Colostomy status: Secondary | ICD-10-CM

## 2021-10-31 DIAGNOSIS — Z9114 Patient's other noncompliance with medication regimen: Secondary | ICD-10-CM | POA: Diagnosis not present

## 2021-10-31 DIAGNOSIS — E785 Hyperlipidemia, unspecified: Secondary | ICD-10-CM | POA: Diagnosis not present

## 2021-10-31 DIAGNOSIS — R600 Localized edema: Secondary | ICD-10-CM

## 2021-10-31 DIAGNOSIS — Z87891 Personal history of nicotine dependence: Secondary | ICD-10-CM | POA: Diagnosis not present

## 2021-10-31 DIAGNOSIS — I5023 Acute on chronic systolic (congestive) heart failure: Secondary | ICD-10-CM | POA: Diagnosis present

## 2021-10-31 DIAGNOSIS — I4891 Unspecified atrial fibrillation: Principal | ICD-10-CM | POA: Diagnosis present

## 2021-10-31 DIAGNOSIS — Z7902 Long term (current) use of antithrombotics/antiplatelets: Secondary | ICD-10-CM | POA: Diagnosis not present

## 2021-10-31 DIAGNOSIS — E1122 Type 2 diabetes mellitus with diabetic chronic kidney disease: Secondary | ICD-10-CM | POA: Diagnosis not present

## 2021-10-31 DIAGNOSIS — Z20822 Contact with and (suspected) exposure to covid-19: Secondary | ICD-10-CM | POA: Diagnosis not present

## 2021-10-31 DIAGNOSIS — R0602 Shortness of breath: Secondary | ICD-10-CM | POA: Diagnosis not present

## 2021-10-31 DIAGNOSIS — J9601 Acute respiratory failure with hypoxia: Secondary | ICD-10-CM | POA: Diagnosis present

## 2021-10-31 DIAGNOSIS — Z7984 Long term (current) use of oral hypoglycemic drugs: Secondary | ICD-10-CM | POA: Diagnosis not present

## 2021-10-31 DIAGNOSIS — D72829 Elevated white blood cell count, unspecified: Secondary | ICD-10-CM | POA: Diagnosis present

## 2021-10-31 DIAGNOSIS — I110 Hypertensive heart disease with heart failure: Secondary | ICD-10-CM | POA: Diagnosis not present

## 2021-10-31 DIAGNOSIS — Z66 Do not resuscitate: Secondary | ICD-10-CM | POA: Diagnosis not present

## 2021-10-31 DIAGNOSIS — J9811 Atelectasis: Secondary | ICD-10-CM | POA: Diagnosis not present

## 2021-10-31 DIAGNOSIS — E119 Type 2 diabetes mellitus without complications: Secondary | ICD-10-CM | POA: Diagnosis not present

## 2021-10-31 DIAGNOSIS — H9190 Unspecified hearing loss, unspecified ear: Secondary | ICD-10-CM | POA: Diagnosis present

## 2021-10-31 DIAGNOSIS — I130 Hypertensive heart and chronic kidney disease with heart failure and stage 1 through stage 4 chronic kidney disease, or unspecified chronic kidney disease: Principal | ICD-10-CM | POA: Diagnosis present

## 2021-10-31 DIAGNOSIS — Z888 Allergy status to other drugs, medicaments and biological substances status: Secondary | ICD-10-CM

## 2021-10-31 DIAGNOSIS — Z79899 Other long term (current) drug therapy: Secondary | ICD-10-CM | POA: Diagnosis not present

## 2021-10-31 DIAGNOSIS — I517 Cardiomegaly: Secondary | ICD-10-CM | POA: Diagnosis not present

## 2021-10-31 DIAGNOSIS — Z86718 Personal history of other venous thrombosis and embolism: Secondary | ICD-10-CM | POA: Diagnosis not present

## 2021-10-31 DIAGNOSIS — I10 Essential (primary) hypertension: Secondary | ICD-10-CM | POA: Diagnosis present

## 2021-10-31 DIAGNOSIS — E1129 Type 2 diabetes mellitus with other diabetic kidney complication: Secondary | ICD-10-CM | POA: Diagnosis present

## 2021-10-31 DIAGNOSIS — I5021 Acute systolic (congestive) heart failure: Secondary | ICD-10-CM | POA: Diagnosis not present

## 2021-10-31 DIAGNOSIS — E1151 Type 2 diabetes mellitus with diabetic peripheral angiopathy without gangrene: Secondary | ICD-10-CM | POA: Diagnosis present

## 2021-10-31 DIAGNOSIS — I639 Cerebral infarction, unspecified: Secondary | ICD-10-CM | POA: Diagnosis not present

## 2021-10-31 DIAGNOSIS — I208 Other forms of angina pectoris: Secondary | ICD-10-CM | POA: Diagnosis not present

## 2021-10-31 DIAGNOSIS — Z8673 Personal history of transient ischemic attack (TIA), and cerebral infarction without residual deficits: Secondary | ICD-10-CM | POA: Diagnosis not present

## 2021-10-31 DIAGNOSIS — J90 Pleural effusion, not elsewhere classified: Secondary | ICD-10-CM | POA: Diagnosis not present

## 2021-10-31 DIAGNOSIS — J811 Chronic pulmonary edema: Secondary | ICD-10-CM | POA: Diagnosis not present

## 2021-10-31 DIAGNOSIS — I509 Heart failure, unspecified: Secondary | ICD-10-CM | POA: Diagnosis not present

## 2021-10-31 DIAGNOSIS — Z7982 Long term (current) use of aspirin: Secondary | ICD-10-CM

## 2021-10-31 LAB — GLUCOSE, CAPILLARY: Glucose-Capillary: 174 mg/dL — ABNORMAL HIGH (ref 70–99)

## 2021-10-31 LAB — CBC WITH DIFFERENTIAL/PLATELET
Abs Immature Granulocytes: 0.04 10*3/uL (ref 0.00–0.07)
Basophils Absolute: 0.1 10*3/uL (ref 0.0–0.1)
Basophils Relative: 1 %
Eosinophils Absolute: 0.3 10*3/uL (ref 0.0–0.5)
Eosinophils Relative: 3 %
HCT: 42.9 % (ref 36.0–46.0)
Hemoglobin: 13.8 g/dL (ref 12.0–15.0)
Immature Granulocytes: 0 %
Lymphocytes Relative: 26 %
Lymphs Abs: 3 10*3/uL (ref 0.7–4.0)
MCH: 29.2 pg (ref 26.0–34.0)
MCHC: 32.2 g/dL (ref 30.0–36.0)
MCV: 90.7 fL (ref 80.0–100.0)
Monocytes Absolute: 1.3 10*3/uL — ABNORMAL HIGH (ref 0.1–1.0)
Monocytes Relative: 12 %
Neutro Abs: 6.8 10*3/uL (ref 1.7–7.7)
Neutrophils Relative %: 58 %
Platelets: 332 10*3/uL (ref 150–400)
RBC: 4.73 MIL/uL (ref 3.87–5.11)
RDW: 14.4 % (ref 11.5–15.5)
WBC: 11.6 10*3/uL — ABNORMAL HIGH (ref 4.0–10.5)
nRBC: 0 % (ref 0.0–0.2)

## 2021-10-31 LAB — BRAIN NATRIURETIC PEPTIDE: B Natriuretic Peptide: 355.8 pg/mL — ABNORMAL HIGH (ref 0.0–100.0)

## 2021-10-31 LAB — RESP PANEL BY RT-PCR (FLU A&B, COVID) ARPGX2
Influenza A by PCR: NEGATIVE
Influenza B by PCR: NEGATIVE
SARS Coronavirus 2 by RT PCR: NEGATIVE

## 2021-10-31 LAB — COMPREHENSIVE METABOLIC PANEL
ALT: 10 U/L (ref 0–44)
AST: 18 U/L (ref 15–41)
Albumin: 4.1 g/dL (ref 3.5–5.0)
Alkaline Phosphatase: 55 U/L (ref 38–126)
Anion gap: 7 (ref 5–15)
BUN: 18 mg/dL (ref 8–23)
CO2: 28 mmol/L (ref 22–32)
Calcium: 9.8 mg/dL (ref 8.9–10.3)
Chloride: 104 mmol/L (ref 98–111)
Creatinine, Ser: 1.13 mg/dL — ABNORMAL HIGH (ref 0.44–1.00)
GFR, Estimated: 47 mL/min — ABNORMAL LOW (ref >60)
Glucose, Bld: 151 mg/dL — ABNORMAL HIGH (ref 70–99)
Potassium: 4.5 mmol/L (ref 3.5–5.1)
Sodium: 139 mmol/L (ref 135–145)
Total Bilirubin: 1.4 mg/dL — ABNORMAL HIGH (ref 0.3–1.2)
Total Protein: 7.5 g/dL (ref 6.5–8.1)

## 2021-10-31 LAB — MAGNESIUM: Magnesium: 2.5 mg/dL — ABNORMAL HIGH (ref 1.7–2.4)

## 2021-10-31 LAB — CBG MONITORING, ED: Glucose-Capillary: 149 mg/dL — ABNORMAL HIGH (ref 70–99)

## 2021-10-31 LAB — TROPONIN I (HIGH SENSITIVITY): Troponin I (High Sensitivity): 17 ng/L (ref <18)

## 2021-10-31 MED ORDER — DM-GUAIFENESIN ER 30-600 MG PO TB12: 1.0000 | ORAL_TABLET | Freq: Two times a day (BID) | ORAL | Status: DC | PRN

## 2021-10-31 MED ORDER — ACETAMINOPHEN 325 MG PO TABS: 650.0000 mg | ORAL_TABLET | Freq: Four times a day (QID) | ORAL | Status: DC | PRN

## 2021-10-31 MED ORDER — HYDROXYZINE HCL 10 MG PO TABS
10.0000 mg | ORAL_TABLET | Freq: Three times a day (TID) | ORAL | Status: DC | PRN
  Filled 2021-10-31: qty 1

## 2021-10-31 MED ORDER — HYDRALAZINE HCL 20 MG/ML IJ SOLN: 5.0000 mg | INTRAMUSCULAR | Status: DC | PRN

## 2021-10-31 MED ORDER — ENOXAPARIN SODIUM 40 MG/0.4ML IJ SOSY
40.0000 mg | PREFILLED_SYRINGE | INTRAMUSCULAR | Status: DC
  Administered 2021-11-01 – 2021-11-04 (×4): 40 mg via SUBCUTANEOUS
  Filled 2021-10-31 (×4): qty 0.4

## 2021-10-31 MED ORDER — METOPROLOL SUCCINATE ER 25 MG PO TB24
25.0000 mg | ORAL_TABLET | Freq: Every day | ORAL | Status: DC
  Administered 2021-11-01 – 2021-11-04 (×4): 25 mg via ORAL
  Filled 2021-10-31 (×5): qty 1

## 2021-10-31 MED ORDER — FUROSEMIDE 10 MG/ML IJ SOLN: 40.0000 mg | Freq: Two times a day (BID) | INTRAMUSCULAR | Status: DC

## 2021-10-31 MED ORDER — MUPIROCIN 2 % EX OINT
1.0000 "application " | TOPICAL_OINTMENT | Freq: Two times a day (BID) | CUTANEOUS | Status: DC | PRN
  Filled 2021-10-31: qty 22

## 2021-10-31 MED ORDER — DILTIAZEM HCL 25 MG/5ML IV SOLN
10.0000 mg | Freq: Once | INTRAVENOUS | Status: AC
  Administered 2021-10-31: 10 mg via INTRAVENOUS
  Filled 2021-10-31: qty 5

## 2021-10-31 MED ORDER — FUROSEMIDE 10 MG/ML IJ SOLN
60.0000 mg | Freq: Once | INTRAMUSCULAR | Status: AC
  Administered 2021-10-31: 60 mg via INTRAVENOUS
  Filled 2021-10-31: qty 8

## 2021-10-31 MED ORDER — ASPIRIN 81 MG PO CHEW
81.0000 mg | CHEWABLE_TABLET | Freq: Every day | ORAL | Status: DC
  Administered 2021-11-01 – 2021-11-04 (×4): 81 mg via ORAL
  Filled 2021-10-31 (×4): qty 1

## 2021-10-31 MED ORDER — DILTIAZEM HCL-DEXTROSE 125-5 MG/125ML-% IV SOLN (PREMIX)
5.0000 mg/h | INTRAVENOUS | Status: DC
  Administered 2021-10-31: 5 mg/h via INTRAVENOUS
  Administered 2021-10-31: 13.5 mg/h via INTRAVENOUS
  Filled 2021-10-31 (×2): qty 125

## 2021-10-31 MED ORDER — CLOPIDOGREL BISULFATE 75 MG PO TABS
75.0000 mg | ORAL_TABLET | Freq: Every day | ORAL | Status: DC
  Administered 2021-11-01 – 2021-11-04 (×4): 75 mg via ORAL
  Filled 2021-10-31 (×4): qty 1

## 2021-10-31 MED ORDER — ALBUTEROL SULFATE (2.5 MG/3ML) 0.083% IN NEBU: 3.0000 mL | INHALATION_SOLUTION | RESPIRATORY_TRACT | Status: DC | PRN

## 2021-10-31 MED ORDER — INSULIN ASPART 100 UNIT/ML IJ SOLN
0.0000 [IU] | Freq: Three times a day (TID) | INTRAMUSCULAR | Status: DC
  Administered 2021-11-01: 2 [IU] via SUBCUTANEOUS
  Administered 2021-11-02: 1 [IU] via SUBCUTANEOUS
  Administered 2021-11-02: 3 [IU] via SUBCUTANEOUS
  Administered 2021-11-03 (×2): 2 [IU] via SUBCUTANEOUS
  Administered 2021-11-03 – 2021-11-04 (×2): 1 [IU] via SUBCUTANEOUS
  Administered 2021-11-04: 2 [IU] via SUBCUTANEOUS
  Filled 2021-10-31 (×8): qty 1

## 2021-10-31 MED ORDER — ONDANSETRON HCL 4 MG/2ML IJ SOLN
4.0000 mg | Freq: Three times a day (TID) | INTRAMUSCULAR | Status: DC | PRN
  Administered 2021-10-31: 4 mg via INTRAVENOUS
  Filled 2021-10-31: qty 2

## 2021-10-31 MED ORDER — FUROSEMIDE 10 MG/ML IJ SOLN
40.0000 mg | Freq: Two times a day (BID) | INTRAMUSCULAR | Status: DC
Start: 2021-11-01 — End: 2021-11-04
  Administered 2021-11-01 – 2021-11-04 (×6): 40 mg via INTRAVENOUS
  Filled 2021-10-31 (×6): qty 4

## 2021-10-31 MED ORDER — PRAVASTATIN SODIUM 20 MG PO TABS
20.0000 mg | ORAL_TABLET | Freq: Every day | ORAL | Status: DC
  Administered 2021-11-01 – 2021-11-03 (×3): 20 mg via ORAL
  Filled 2021-10-31 (×3): qty 1

## 2021-10-31 MED ORDER — INSULIN ASPART 100 UNIT/ML IJ SOLN: 0.0000 [IU] | Freq: Every day | INTRAMUSCULAR | Status: DC

## 2021-10-31 NOTE — ED Notes (Signed)
Pt given TV remote

## 2021-10-31 NOTE — ED Triage Notes (Signed)
Pt sent over by Dr. [name] for HR over 160 in the office.  Xray completed in the office.  Pt in new onset a-fib RVR.  Pt symptomatic with SHOB and weakness that has been ongoing for a couple days.

## 2021-10-31 NOTE — ED Notes (Signed)
Bed alarm placed for fall precaution

## 2021-10-31 NOTE — ED Notes (Signed)
Hospital gown and socks placed on pt

## 2021-10-31 NOTE — H&P (Addendum)
History and Physical    Heidi Whitaker DXI:[id number] DOB: Mar 31, 1929 DOA: 10/31/2021  Referring MD/NP/PA:   PCP: McLean-Scocuzza, Nino Glow, MD   Patient coming from:  The patient is coming from home.  At baseline, pt is independent for most of ADL.        Chief Complaint: SOB, weakness   HPI: Heidi Whitaker is a 87 y.o. female with medical history significant of sCHF with EF of 45 to 50%, hypertension, hyperlipidemia, diabetes mellitus, stroke, DVT not on anticoagulants, hard of hearing, CKD-3A, PVD, nosebleeding, chronic diarrhea, atrial fibrillation not on anticoagulants, s/p of colostomy (due to 2 benign tumors per her daughter), who presents with shortness breath and weakness  Patient states she has been not feeling well for almost a week.  She has shortness of breath, particularly on exertion.  Patient has generalized weakness.  She developed heart racing and palpitation today.  Denies chest pain.  No fever or chills.  Denies nausea vomiting, diarrhea or abdominal pain.  No symptoms of UTI. Patient was seen by her cardiologist, Dr. [name] in clinic today, found to have A. fib with RVR, heart rate up to 160.  Patient was sent to ED for further evaluation and treatment.    Patient is not using oxygen normally. She was found to have oxygen desaturating to 88% on room air, which improved to 92-95% on 2 L oxygen in ED.  ED Course: pt was found to have WBC 11.6, troponin level 17, BMP 355, pending COVID PCR, renal function close to baseline, temperature normal, blood pressure 1/80, RR 29.  Chest x-ray showed cardiomegaly, vascular congestion and tiny amount of bilateral pleural effusion.  Patient is admitted to progressive bed as inpatient  Review of Systems:   General: no fevers, chills, no body weight gain, has fatigue HEENT: no blurry vision, hearing changes or sore throat Respiratory: has dyspnea, no coughing, no wheezing CV: no chest pain, no palpitations GI: no nausea,  vomiting, abdominal pain, diarrhea, constipation GU: no dysuria, burning on urination, increased urinary frequency, hematuria  Ext: has leg edema Neuro: no unilateral weakness, numbness, or tingling, no vision change or hearing loss Skin: no rash, no skin tear. MSK: No muscle spasm, no deformity, no limitation of range of movement in spin Heme: No easy bruising.  Travel history: No recent long distant travel.  Allergy:  Allergies  Allergen Reactions   Metformin And Related     Diarrhea     Past Medical History:  Diagnosis Date   Complication of anesthesia    Diabetes mellitus without complication ([place])    DVT (deep venous thrombosis) ([place])    06/02/2019   Hard of hearing    Hypertension     Past Surgical History:  Procedure Laterality Date   CESAREAN SECTION     COLONOSCOPY N/A 05/17/2019   Procedure: FLEXIBLE SIGMOIDOSCOPY;  Surgeon: Ileana Roup, MD;  Location: [facility];  Service: General;  Laterality: N/A;   COLONOSCOPY WITH PROPOFOL N/A 01/09/2019   Procedure: COLONOSCOPY WITH PROPOFOL;  Surgeon: Lin Landsman, MD;  Location: [facility] ENDOSCOPY;  Service: Gastroenterology;  Laterality: N/A;   ESOPHAGOGASTRODUODENOSCOPY (EGD) WITH PROPOFOL N/A 01/09/2019   Procedure: ESOPHAGOGASTRODUODENOSCOPY (EGD) WITH PROPOFOL;  Surgeon: Lin Landsman, MD;  Location: [facility] ENDOSCOPY;  Service: Gastroenterology;  Laterality: N/A;   KNEE SURGERY     left 2017/2018   LOWER EXTREMITY ANGIOGRAPHY Left 05/29/2021   Procedure: LOWER EXTREMITY ANGIOGRAPHY;  Surgeon: Algernon Huxley, MD;  Location: [facility]  CV [facility];  Service: Cardiovascular;  Laterality: Left;   LOWER EXTREMITY ANGIOGRAPHY Right 06/05/2021   Procedure: LOWER EXTREMITY ANGIOGRAPHY;  Surgeon: Algernon Huxley, MD;  Location: [facility];  Service: Cardiovascular;  Laterality: Right;   SHOULDER SURGERY     right   XI ROBOTIC ASSISTED LOWER ANTERIOR RESECTION N/A 05/17/2019   Procedure: XI ROBOTIC ASSISTED LOWER  ANTERIOR RESECTION WITH END COLOSTOMY;  Surgeon: Ileana Roup, MD;  Location: [facility];  Service: General;  Laterality: N/A;    Social History:  reports that she has quit smoking. Her smoking use included cigarettes. She has never used smokeless tobacco. She reports that she does not drink alcohol and does not use drugs.  Family History:  Family History  Problem Relation Age of Onset   Addison's disease Maternal Aunt    Heart disease Mother    Heart disease Father      Prior to Admission medications   Medication Sig Start Date End Date Taking? Authorizing Provider  aspirin 81 MG chewable tablet Chew 81 mg by mouth daily.   Yes [provider]  clopidogrel (PLAVIX) 75 MG tablet Take 1 tablet (75 mg total) by mouth daily. 06/13/21  Yes Kris Hartmann, NP  furosemide (LASIX) 20 MG tablet Take 1 tablet (20 mg total) by mouth daily as needed. 06/06/21  Yes McLean-Scocuzza, Nino Glow, MD  glimepiride (AMARYL) 2 MG tablet Take 1 tablet (2 mg total) by mouth daily with breakfast. 11/29/20  Yes McLean-Scocuzza, Nino Glow, MD  lisinopril (ZESTRIL) 2.5 MG tablet Take 1 tablet (2.5 mg total) by mouth daily. 05/21/21  Yes McLean-Scocuzza, Nino Glow, MD  lovastatin (MEVACOR) 20 MG tablet Take 1 tablet (20 mg total) by mouth daily at 6 PM. 11/29/20  Yes McLean-Scocuzza, Nino Glow, MD  metoprolol succinate (TOPROL-XL) 25 MG 24 hr tablet Take 1 tablet (25 mg total) by mouth daily. 09/04/21  Yes McLean-Scocuzza, Nino Glow, MD  mupirocin ointment (BACTROBAN) 2 % Apply 1 application topically 2 (two) times daily. 05/21/21   McLean-Scocuzza, Nino Glow, MD    Physical Exam: Vitals:   10/31/21 1215 10/31/21 1220 10/31/21 1224 10/31/21 1300  BP:  108/84  114/73  Pulse: (!) 128 (!) 127 (!) 128 (!) 142  Resp: (!) 24 (!) 25 (!) 25 (!) 22  Temp:      TempSrc:      SpO2: 92% 94% 97% 92%  Weight:      Height:       General: Not in acute distress HEENT:       Eyes: PERRL, EOMI, no scleral icterus.       ENT: No  discharge from the ears and nose, no pharynx injection, no tonsillar enlargement.        Neck: positive JVD, no bruit, no mass felt. Heme: No neck lymph node enlargement. Cardiac: S1/S2, irregularly irregular rhythm, no murmurs, No gallops or rubs. Respiratory: Has crackles bilaterally GI: Soft, nondistended, nontender, no rebound pain, no organomegaly, BS present. S/p of colostomy. GU: No hematuria Ext: 1+ pitting leg edema bilaterally. 1+DP/PT pulse bilaterally. Musculoskeletal: No joint deformities, No joint redness or warmth, no limitation of ROM in spin. Skin: No rashes.  Neuro: Alert, oriented X3, cranial nerves II-XII grossly intact, moves all extremities normally.  Psych: Patient is not psychotic, no suicidal or hemocidal ideation.  Labs on Admission: I have personally reviewed following labs and imaging studies  CBC: Recent Labs  Lab 10/31/21 1115  WBC 11.6*  NEUTROABS 6.8  HGB 13.8  HCT 42.9  MCV 90.7  PLT 237   Basic Metabolic Panel: Recent Labs  Lab 10/31/21 1115  NA 139  K 4.5  CL 104  CO2 28  GLUCOSE 151*  BUN 18  CREATININE 1.13*  CALCIUM 9.8  MG 2.5*   GFR: Estimated Creatinine Clearance: 33.6 mL/min (A) (by C-G formula based on SCr of 1.13 mg/dL (H)). Liver Function Tests: Recent Labs  Lab 10/31/21 1115  AST 18  ALT 10  ALKPHOS 55  BILITOT 1.4*  PROT 7.5  ALBUMIN 4.1   No results for input(s): LIPASE, AMYLASE in the last 168 hours. No results for input(s): AMMONIA in the last 168 hours. Coagulation Profile: No results for input(s): INR, PROTIME in the last 168 hours. Cardiac Enzymes: No results for input(s): CKTOTAL, CKMB, CKMBINDEX, TROPONINI in the last 168 hours. BNP (last 3 results) No results for input(s): PROBNP in the last 8760 hours. HbA1C: No results for input(s): HGBA1C in the last 72 hours. CBG: No results for input(s): GLUCAP in the last 168 hours. Lipid Profile: No results for input(s): CHOL, HDL, LDLCALC, TRIG, CHOLHDL,  LDLDIRECT in the last 72 hours. Thyroid Function Tests: No results for input(s): TSH, T4TOTAL, FREET4, T3FREE, THYROIDAB in the last 72 hours. Anemia Panel: No results for input(s): VITAMINB12, FOLATE, FERRITIN, TIBC, IRON, RETICCTPCT in the last 72 hours. Urine analysis:    Component Value Date/Time   COLORURINE STRAW (A) 05/14/2021 1806   APPEARANCEUR CLEAR (A) 05/14/2021 1806   LABSPEC 1.004 (L) 05/14/2021 1806   PHURINE 7.0 05/14/2021 1806   GLUCOSEU NEGATIVE 05/14/2021 1806   HGBUR NEGATIVE 05/14/2021 1806   BILIRUBINUR NEGATIVE 05/14/2021 1806   KETONESUR NEGATIVE 05/14/2021 1806   PROTEINUR NEGATIVE 05/14/2021 1806   NITRITE NEGATIVE 05/14/2021 1806   LEUKOCYTESUR SMALL (A) 05/14/2021 1806   Sepsis Labs: @LABRCNTIP (procalcitonin:4,lacticidven:4) ) Recent Results (from the past 240 hour(s))  Resp Panel by RT-PCR (Flu A&B, Covid) Nasopharyngeal Swab     Status: None   Collection Time: 10/31/21 11:12 AM   Specimen: Nasopharyngeal Swab; Nasopharyngeal(NP) swabs in vial transport medium  Result Value Ref Range Status   SARS Coronavirus 2 by RT PCR NEGATIVE NEGATIVE Final    Comment: (NOTE) SARS-CoV-2 target nucleic acids are NOT DETECTED.  The SARS-CoV-2 RNA is generally detectable in upper respiratory specimens during the acute phase of infection. The lowest concentration of SARS-CoV-2 viral copies this assay can detect is 138 copies/mL. A negative result does not preclude SARS-Cov-2 infection and should not be used as the sole basis for treatment or other patient management decisions. A negative result may occur with  improper specimen collection/handling, submission of specimen other than nasopharyngeal swab, presence of viral mutation(s) within the areas targeted by this assay, and inadequate number of viral copies(<138 copies/mL). A negative result must be combined with clinical observations, patient history, and epidemiological information. The expected result is  Negative.  Fact Sheet for Patients:  EntrepreneurPulse.com.au  Fact Sheet for Healthcare Providers:  IncredibleEmployment.be  This test is no t yet approved or cleared by the [place] FDA and  has been authorized for detection and/or diagnosis of SARS-CoV-2 by FDA under an Emergency Use Authorization (EUA). This EUA will remain  in effect (meaning this test can be used) for the duration of the COVID-19 declaration under Section 564(b)(1) of the Act, 21 U.S.C.section 360bbb-3(b)(1), unless the authorization is terminated  or revoked sooner.       Influenza A by PCR NEGATIVE NEGATIVE Final  Influenza B by PCR NEGATIVE NEGATIVE Final    Comment: (NOTE) The Xpert Xpress SARS-CoV-2/FLU/RSV plus assay is intended as an aid in the diagnosis of influenza from Nasopharyngeal swab specimens and should not be used as a sole basis for treatment. Nasal washings and aspirates are unacceptable for Xpert Xpress SARS-CoV-2/FLU/RSV testing.  Fact Sheet for Patients: EntrepreneurPulse.com.au  Fact Sheet for Healthcare Providers: IncredibleEmployment.be  This test is not yet approved or cleared by the [place] FDA and has been authorized for detection and/or diagnosis of SARS-CoV-2 by FDA under an Emergency Use Authorization (EUA). This EUA will remain in effect (meaning this test can be used) for the duration of the COVID-19 declaration under Section 564(b)(1) of the Act, 21 U.S.C. section 360bbb-3(b)(1), unless the authorization is terminated or revoked.  Performed at [facility] [address], [place], [place] [address]      Radiological Exams on Admission: DG Chest Portable 1 View  Result Date: 10/31/2021 CLINICAL DATA:  Shortness of breath. Elevated heart rate. Evaluate for infiltrate or pulmonary congestion. EXAM: PORTABLE CHEST 1 VIEW COMPARISON:  05/14/2021 FINDINGS: Patient rotated  right. Right shoulder arthroplasty. Midline trachea. Mild cardiomegaly. Atherosclerosis in the transverse aorta. Tiny bilateral pleural effusions. No pneumothorax. Pulmonary interstitial prominence and indistinctness, increased. Right greater than left mild subsegmental atelectasis at the bases. IMPRESSION: Cardiomegaly and mild pulmonary venous congestion with tiny bilateral pleural effusions and dependent atelectasis. Aortic Atherosclerosis (ICD10-I70.0). Electronically Signed   By: Abigail Miyamoto M.D.   On: 10/31/2021 11:34     EKG: I have personally reviewed.  Atrial fibrillation, QTC 530, heart rate of 155, poor R wave progression   Assessment/Plan Principal Problem:   Acute on chronic systolic CHF (congestive heart failure) (HCC) Active Problems:   HTN (hypertension)   Hyperlipidemia   Atrial fibrillation with RVR (HCC)   Leukocytosis   Stage 3a chronic kidney disease (HCC)   Type II diabetes mellitus with renal manifestations (HCC)   Stroke (HCC)   Acute respiratory failure with hypoxia (HCC)   Acute respiratory failure with hypoxia due acute on chronic systolic CHF (congestive heart failure) ([place]): 2D echo on 05/15/2021 showed EF of 45-50%.  Patient has elevated BNP 355, cardiomegaly with vascular congestion on chest x-ray, positive JVD, 1+ leg edema bilaterally, crackles on auscultation, clinically consistent with CHF exacerbation.  -Will admit to progressive unit as inpatient -Lasix 40 mg bid by (received 60 mg of Lasix in ED) -2d echo -Daily weights -strict I/O's -Low salt diet -Fluid restriction -Obtain REDs Vest reading  HTN (hypertension) -IV hydralazine as needed -Metoprolol which is for A fib with RVR, will start tomorrow since pt is on cardizem gtt with soft Bp now -Patient is on IV Lasix  Hyperlipidemia -Continue pravastatin  Atrial fibrillation with RVR ([place]): Patient is not taking anticoagulants.  Heart rate 168 --> 80 for now -On Cardizem drip -Continue  metoprolol  Leukocytosis: Mild.  WBC 11.6.  No source of infection identified.  Likely reactive -Follow-up with CBC  Stage 3a chronic kidney disease ([place]): Stable, creatinine 1.13, BUN 18. -Follow-up with BMP  Type II diabetes mellitus with renal manifestations [facility]): Recent A1c 7.0, not well controlled.  Patient is taking glipizide at home -Sliding scale insulin  Stroke (HCC) -Aspirin, Plavix -Pravastatin         DVT ppx: SQ Lovenox Code Status: DNR per her daughter Family Communication:  Yes, patient's daughter by phone Disposition Plan:  Anticipate discharge back to previous environment Consults called:  none Admission  status and Level of care: Progressive Cardiac:   as inpt        Status is: Inpatient  Remains inpatient appropriate because: Patient has multiple comorbidities, now presents with CHF exacerbation and A. fib with RVR with heart rates of up to 160s.  Patient has new oxygen requirement.  Her presentation is highly complicated.  Given her old age, patient is at high risk of deteriorating.  Will need to be treated in hospital for at least 2 days.           Date of Service 10/31/2021    Ivor Costa [facility]   If 7PM-7AM, please contact night-coverage [email or web address] 10/31/2021, 1:40 PM

## 2021-10-31 NOTE — ED Notes (Signed)
Family at bedside at this time- snack given per family/pt request.

## 2021-10-31 NOTE — ED Notes (Signed)
Colostomy checked at this time- approximately 15% of bag noted to be filled. Did not empty at this time.

## 2021-10-31 NOTE — ED Provider Notes (Signed)
[facility] Emergency Department Provider Note ____________________________________________   Event Date/Time   First MD Initiated Contact with Patient 10/31/21 1104     (approximate)  I have reviewed the triage vital signs and the nursing notes.  HISTORY  Chief Complaint Atrial Fibrillation   HPI Heidi Whitaker is a 87 y.o. femalewho presents to the ED for evaluation of shortness of breath and fluttering  Chart review indicates [facility], was to be seen this AM as an outpatient but referred to the ED due to HR.  PAD on DAPT w Plavix HTN, DM.  Echo 6 months ago with EF of 45%. She resides at home with her daughter and son-in-law.  She has as needed Lasix, but not regular dosing of this.  Patient comes to the ED for evaluation of rapid heart rate from the cardiologist's office.  She and daughter, at the bedside, reports she has seemed weaker, complain of shortness of breath and dyspnea on exertion of the past 1 week.  Increasing lower extremity edema.  Patient reports developing palpitations and heart fluttering sensations for the past 1 day intermittently.  Denies any pain or pressure, denies syncope, cough, fever.  Reports continued palpitations and fluttering now.  Denies shortness of breath while upright.  Does report orthopnea.  Past Medical History:  Diagnosis Date   Complication of anesthesia    Diabetes mellitus without complication ([place])    DVT (deep venous thrombosis) ([place])    06/02/2019   Hard of hearing    Hypertension     Patient Active Problem List   Diagnosis Date Noted   Acute on chronic systolic CHF (congestive heart failure) ([place]) 10/31/2021   Type II diabetes mellitus with renal manifestations ([place]) 10/31/2021   Stroke ([place]) 10/31/2021   Acute respiratory failure with hypoxia ([place]) 10/31/2021   PAD (peripheral artery disease) ([place]) 05/21/2021   Valvular heart disease 54/27/0623   Acute systolic congestive heart  failure ([place]) 05/21/2021   Cerebral infarction involving left cerebellar artery ([place]) 05/21/2021   Dyspnea 05/15/2021   Generalized weakness 05/14/2021   Stage 3a chronic kidney disease ([place]) 11/29/2020   Hypertension associated with diabetes ([place]) 11/29/2020   Aortic atherosclerosis ([place]) 11/29/2020   Tubulovillous adenoma of rectum 03/05/2020   Vitamin D deficiency 06/09/2019   Atrial fibrillation with RVR ([place]) 06/09/2019   Leukocytosis 06/09/2019   Acute deep vein thrombosis (DVT) of distal vein of left lower extremity ([place]) 06/09/2019   Leg edema 06/09/2019   Diabetes mellitus type 2 in obese ([place])    Rectal mass 05/17/2019   Insomnia 09/30/2018   Abnormal gait 09/30/2018   Bilateral impacted cerumen 09/30/2018   Chronic diarrhea 09/30/2018   Type 2 diabetes mellitus with stage 3 chronic kidney disease, without long-term current use of insulin ([place]) 09/30/2018   Hyperlipidemia 09/30/2018   Nosebleed 09/30/2018   Does use hearing aid 09/30/2018   Positive colorectal cancer screening using Cologuard test 08/12/2018   HTN (hypertension) 08/12/2018    Past Surgical History:  Procedure Laterality Date   CESAREAN SECTION     COLONOSCOPY N/A 05/17/2019   Procedure: FLEXIBLE SIGMOIDOSCOPY;  Surgeon: Ileana Roup, MD;  Location: [facility];  Service: General;  Laterality: N/A;   COLONOSCOPY WITH PROPOFOL N/A 01/09/2019   Procedure: COLONOSCOPY WITH PROPOFOL;  Surgeon: Lin Landsman, MD;  Location: [facility];  Service: Gastroenterology;  Laterality: N/A;   ESOPHAGOGASTRODUODENOSCOPY (EGD) WITH PROPOFOL N/A 01/09/2019   Procedure: ESOPHAGOGASTRODUODENOSCOPY (EGD) WITH PROPOFOL;  Surgeon: Sherri Sear  Reece Levy, MD;  Location: [place];  Service: Gastroenterology;  Laterality: N/A;   KNEE SURGERY     left 2017/2018   LOWER EXTREMITY ANGIOGRAPHY Left 05/29/2021   Procedure: LOWER EXTREMITY ANGIOGRAPHY;  Surgeon: Algernon Huxley, MD;  Location: [place] [facility];  Service:  Cardiovascular;  Laterality: Left;   LOWER EXTREMITY ANGIOGRAPHY Right 06/05/2021   Procedure: LOWER EXTREMITY ANGIOGRAPHY;  Surgeon: Algernon Huxley, MD;  Location: [place] [facility];  Service: Cardiovascular;  Laterality: Right;   SHOULDER SURGERY     right   XI ROBOTIC ASSISTED LOWER ANTERIOR RESECTION N/A 05/17/2019   Procedure: XI ROBOTIC ASSISTED LOWER ANTERIOR RESECTION WITH END COLOSTOMY;  Surgeon: Ileana Roup, MD;  Location: [facility];  Service: General;  Laterality: N/A;    Prior to Admission medications   Medication Sig Start Date End Date Taking? Authorizing Provider  aspirin 81 MG chewable tablet Chew 81 mg by mouth daily.   Yes [provider]  clopidogrel (PLAVIX) 75 MG tablet Take 1 tablet (75 mg total) by mouth daily. 06/13/21  Yes Kris Hartmann, NP  furosemide (LASIX) 20 MG tablet Take 1 tablet (20 mg total) by mouth daily as needed. 06/06/21  Yes McLean-Scocuzza, Nino Glow, MD  glimepiride (AMARYL) 2 MG tablet Take 1 tablet (2 mg total) by mouth daily with breakfast. 11/29/20  Yes McLean-Scocuzza, Nino Glow, MD  lisinopril (ZESTRIL) 2.5 MG tablet Take 1 tablet (2.5 mg total) by mouth daily. 05/21/21  Yes McLean-Scocuzza, Nino Glow, MD  lovastatin (MEVACOR) 20 MG tablet Take 1 tablet (20 mg total) by mouth daily at 6 PM. 11/29/20  Yes McLean-Scocuzza, Nino Glow, MD  metoprolol succinate (TOPROL-XL) 25 MG 24 hr tablet Take 1 tablet (25 mg total) by mouth daily. 09/04/21  Yes McLean-Scocuzza, Nino Glow, MD  mupirocin ointment (BACTROBAN) 2 % Apply 1 application topically 2 (two) times daily. 05/21/21   McLean-Scocuzza, Nino Glow, MD    Allergies Metformin and related  Family History  Problem Relation Age of Onset   Addison's disease Maternal Aunt    Heart disease Mother    Heart disease Father     Social History Social History   Tobacco Use   Smoking status: Former    Years: 4.00    Types: Cigarettes   Smokeless tobacco: Never   Tobacco comments:    3 per day   Vaping Use   Vaping Use: Never used  Substance Use Topics   Alcohol use: No   Drug use: No    Review of Systems  Constitutional: No fever/chills Eyes: No visual changes. ENT: No sore throat. Cardiovascular: Denies chest pain.  Positive for palpitations Respiratory: Positive for orthopnea and dyspnea on exertion Gastrointestinal: No abdominal pain.  No nausea, no vomiting.  No diarrhea.  No constipation. Genitourinary: Negative for dysuria. Musculoskeletal: Negative for back pain. Skin: Negative for rash. Neurological: Negative for headaches, focal weakness or numbness.  ____________________________________________   PHYSICAL EXAM:  VITAL SIGNS: Vitals:   10/31/21 1220 10/31/21 1224  BP: 108/84   Pulse: (!) 127 (!) 128  Resp: (!) 25 (!) 25  Temp:    SpO2: 94% 97%      Constitutional: Alert. Well appearing and in no acute distress.  Sitting upright in bed.  A little hard of hearing, but pleasant and conversational. Eyes: Conjunctivae are normal. PERRL. EOMI. Head: Atraumatic. Nose: No congestion/rhinnorhea. Mouth/Throat: Mucous membranes are moist.  Oropharynx non-erythematous. Neck: No stridor. No cervical spine tenderness to palpation. Cardiovascular: Tachycardic  rate, irregular rhythm. Grossly normal heart sounds.  Good peripheral circulation. Respiratory: Tachypneic to the mid 20s.  No wheezing.  Faint bibasilar crackles are present. Gastrointestinal: Soft , nondistended, nontender to palpation. No CVA tenderness. Musculoskeletal: No lower extremity tenderness.  No joint effusions. No signs of acute trauma. Trace pitting edema to bilateral lower extremities symmetrically without overlying skin changes. Neurologic:  Normal speech and language. No gross focal neurologic deficits are appreciated. Skin:  Skin is warm, dry and intact. No rash noted. Psychiatric: Mood and affect are normal. Speech and behavior are  normal.  ____________________________________________   LABS (all labs ordered are listed, but only abnormal results are displayed)  Labs Reviewed  MAGNESIUM - Abnormal; Notable for the following components:      Result Value   Magnesium 2.5 (*)    All other components within normal limits  COMPREHENSIVE METABOLIC PANEL - Abnormal; Notable for the following components:   Glucose, Bld 151 (*)    Creatinine, Ser 1.13 (*)    Total Bilirubin 1.4 (*)    GFR, Estimated 47 (*)    All other components within normal limits  CBC WITH DIFFERENTIAL/PLATELET - Abnormal; Notable for the following components:   WBC 11.6 (*)    Monocytes Absolute 1.3 (*)    All other components within normal limits  BRAIN NATRIURETIC PEPTIDE - Abnormal; Notable for the following components:   B Natriuretic Peptide 355.8 (*)    All other components within normal limits  RESP PANEL BY RT-PCR (FLU A&B, COVID) ARPGX2  TROPONIN I (HIGH SENSITIVITY)   ____________________________________________  12 Lead EKG  A. fib with RVR, rate of 155 bpm.  Normal axis.  No ischemic features.  Tremors affect the baseline ____________________________________________  RADIOLOGY  ED MD interpretation: 1 view CXR reviewed by me with cardiomegaly, pulmonary vascular congestion and tiny bibasilar effusions  Official radiology report(s): DG Chest Portable 1 View  Result Date: 10/31/2021 CLINICAL DATA:  Shortness of breath. Elevated heart rate. Evaluate for infiltrate or pulmonary congestion. EXAM: PORTABLE CHEST 1 VIEW COMPARISON:  05/14/2021 FINDINGS: Patient rotated right. Right shoulder arthroplasty. Midline trachea. Mild cardiomegaly. Atherosclerosis in the transverse aorta. Tiny bilateral pleural effusions. No pneumothorax. Pulmonary interstitial prominence and indistinctness, increased. Right greater than left mild subsegmental atelectasis at the bases. IMPRESSION: Cardiomegaly and mild pulmonary venous congestion with tiny  bilateral pleural effusions and dependent atelectasis. Aortic Atherosclerosis (ICD10-I70.0). Electronically Signed   By: Abigail Miyamoto M.D.   On: 10/31/2021 11:34    ____________________________________________   PROCEDURES and INTERVENTIONS  Procedure(s) performed (including Critical Care):  .1-3 Lead EKG Interpretation Performed by: Vladimir Crofts, MD Authorized by: Vladimir Crofts, MD     Interpretation: abnormal     ECG rate:  144   ECG rate assessment: tachycardic     Rhythm: atrial fibrillation     Ectopy: none     Conduction: normal   .Critical Care Performed by: Vladimir Crofts, MD Authorized by: Vladimir Crofts, MD   Critical care provider statement:    Critical care time (minutes):  32   Critical care was necessary to treat or prevent imminent or life-threatening deterioration of the following conditions:  Cardiac failure   Critical care was time spent personally by me on the following activities:  Ordering and performing treatments and interventions, ordering and review of laboratory studies, ordering and review of radiographic studies, pulse oximetry, re-evaluation of patient's condition, review of old charts, obtaining history from patient or surrogate, examination of patient and evaluation of patient's response  to treatment  Medications  diltiazem (CARDIZEM) 125 mg in dextrose 5% 125 mL (1 mg/mL) infusion (7.5 mg/hr Intravenous Rate/Dose Change 10/31/21 1157)  ondansetron (ZOFRAN) injection 4 mg (has no administration in time range)  acetaminophen (TYLENOL) tablet 650 mg (has no administration in time range)  hydrALAZINE (APRESOLINE) injection 5 mg (has no administration in time range)  albuterol (PROVENTIL) (2.5 MG/3ML) 0.083% nebulizer solution 3 mL (has no administration in time range)  dextromethorphan-guaiFENesin (MUCINEX DM) 30-600 MG per 12 hr tablet 1 tablet (has no administration in time range)  insulin aspart (novoLOG) injection 0-9 Units (has no administration in time  range)  insulin aspart (novoLOG) injection 0-5 Units (has no administration in time range)  hydrOXYzine (ATARAX/VISTARIL) tablet 10 mg (has no administration in time range)  furosemide (LASIX) injection 40 mg (has no administration in time range)  diltiazem (CARDIZEM) injection 10 mg (10 mg Intravenous Given 10/31/21 1132)  furosemide (LASIX) injection 60 mg (60 mg Intravenous Given 10/31/21 1202)    ____________________________________________   MDM / ED COURSE   87 year old woman presents to the ED with symptomatic atrial fibrillation with RVR in the setting of CHF exacerbation requiring diltiazem drip, diuresis and medical admission.  She presents with rapid rates, but hemodynamically stable.  Does have evidence of volume overload and CHF exacerbation, likely precipitating her atrial fibrillation.  Electrolytes without abnormalities, no evidence of ACS.  CXR without infiltration or PTX.  We will initiate diuresis and place her on diltiazem drip after bolus for improved rates.  She tolerates this diltiazem well without hypotension and rates are improving at the time of admission.  Clinical Course as of 10/31/21 1326  Fri Oct 31, 2021  1121 Discussed plan of care with patient, daughter and son-in-law.  We discussed A. fib, CHF and medical admission.  They are in agreement. [DS]    Clinical Course User Index [DS] Vladimir Crofts, MD    ____________________________________________   FINAL CLINICAL IMPRESSION(S) / ED DIAGNOSES  Final diagnoses:  Atrial fibrillation with RVR ([place])  Acute on chronic systolic congestive heart failure [facility])     ED Discharge Orders     None        Cartel Mauss   Note:  This document was prepared using Dragon voice recognition software and may include unintentional dictation errors.    Vladimir Crofts, MD 10/31/21 [phone]

## 2021-10-31 NOTE — ED Notes (Signed)
Pt taken to floor by RN on cardiac monitor and diltiazem drip at this time. Inpatient RN aware

## 2021-11-01 DIAGNOSIS — I5023 Acute on chronic systolic (congestive) heart failure: Secondary | ICD-10-CM | POA: Diagnosis not present

## 2021-11-01 LAB — GLUCOSE, CAPILLARY
Glucose-Capillary: 109 mg/dL — ABNORMAL HIGH (ref 70–99)
Glucose-Capillary: 176 mg/dL — ABNORMAL HIGH (ref 70–99)
Glucose-Capillary: 116 mg/dL — ABNORMAL HIGH (ref 70–99)
Glucose-Capillary: 178 mg/dL — ABNORMAL HIGH (ref 70–99)

## 2021-11-01 LAB — CBC
HCT: 44.2 % (ref 36.0–46.0)
Hemoglobin: 13.8 g/dL (ref 12.0–15.0)
MCH: 28.9 pg (ref 26.0–34.0)
MCHC: 31.2 g/dL (ref 30.0–36.0)
MCV: 92.5 fL (ref 80.0–100.0)
Platelets: 170 10*3/uL (ref 150–400)
RBC: 4.78 MIL/uL (ref 3.87–5.11)
RDW: 12.5 % (ref 11.5–15.5)
WBC: 12.8 10*3/uL — ABNORMAL HIGH (ref 4.0–10.5)
nRBC: 0 % (ref 0.0–0.2)

## 2021-11-01 LAB — MAGNESIUM: Magnesium: 2.1 mg/dL (ref 1.7–2.4)

## 2021-11-01 LAB — BASIC METABOLIC PANEL
Anion gap: 9 (ref 5–15)
BUN: 13 mg/dL (ref 8–23)
CO2: 31 mmol/L (ref 22–32)
Calcium: 9.2 mg/dL (ref 8.9–10.3)
Chloride: 100 mmol/L (ref 98–111)
Creatinine, Ser: 1.02 mg/dL — ABNORMAL HIGH (ref 0.44–1.00)
GFR, Estimated: 53 mL/min — ABNORMAL LOW (ref >60)
Glucose, Bld: 117 mg/dL — ABNORMAL HIGH (ref 70–99)
Potassium: 3.3 mmol/L — ABNORMAL LOW (ref 3.5–5.1)
Sodium: 140 mmol/L (ref 135–145)

## 2021-11-01 MED ORDER — INFLUENZA VAC A&B SA ADJ QUAD 0.5 ML IM PRSY
0.5000 mL | PREFILLED_SYRINGE | INTRAMUSCULAR | Status: DC
  Filled 2021-11-01: qty 0.5

## 2021-11-01 MED ORDER — DILTIAZEM HCL 30 MG PO TABS
30.0000 mg | ORAL_TABLET | Freq: Three times a day (TID) | ORAL | Status: DC
  Administered 2021-11-01 – 2021-11-02 (×4): 30 mg via ORAL
  Filled 2021-11-01 (×5): qty 1

## 2021-11-01 NOTE — Progress Notes (Signed)
Cardizem gtt stopped at 1030am.  HR was in the 70's and 80s AFIB at that time.  Throughout the day patients HR has started trending up.  Currently sustaining around 110.  MD contacted and received verbal order for PO cardizem 30mg  TID.  Will continue to monitor.

## 2021-11-01 NOTE — Plan of Care (Signed)

## 2021-11-01 NOTE — Progress Notes (Addendum)
PROGRESS NOTE  Heidi Whitaker IRC:[id number] DOB: 1929-07-12 DOA: 10/31/2021 PCP: McLean-Scocuzza, Nino Glow, MD  HPI/Recap of past 24 hours:  Heidi Whitaker is a 87 y.o. female with medical history significant for CHF with EF of 45 to 50%, hypertension, hyperlipidemia, type 2 diabetes mellitus, stroke, DVT, not on anticoagulation, hard of hearing, chronic kidney disease stage III AAA, peripheral vascular disease, history of nosebleed, chronic diarrhea, atrial fibrillation not on anticoagulation, status post colostomy due to 2 benign tumors per her daughter.  She presented to the emergency room for evaluation of shortness of breath and weakness.  Patient was seen by her cardiologist Dr. Towanda Malkin in clinic on the day of presentation and was found to have A. fib with rapid ventricular rate of 160 she was therefore sent to the emergency department for evaluation.  On arrival to the ED she was noted to be desaturating in the 88% on room air which improved to 92 to 95% with 2 L/min.     Subjective: November 01, 2021: Patient seen and examined at bedside . Patient denies any complaints.  She was noted to be in bed reading her Melanee Spry book Denies any chest pain  Assessment/Plan: Principal Problem:   Acute on chronic systolic CHF (congestive heart failure) ([place]) Active Problems:   HTN (hypertension)   Hyperlipidemia   Atrial fibrillation with RVR (HCC)   Leukocytosis   Stage 3a chronic kidney disease (HCC)   Type II diabetes mellitus with renal manifestations (HCC)   Stroke (HCC)   Acute respiratory failure with hypoxia (HCC)      Acute respiratory failure with hypoxia due acute on chronic systolic CHF (congestive heart failure) ([place]):   2D echo on May 15, 2021 showed EF of 45 to 50% She has elevated BNP of 355. She also has cardiomegaly and vascular congestion on x-ray with positive JVD and +1 lower extremity edema bilaterally  -Lasix 40 mg bid by (received 60 mg of Lasix in  ED) -2d echo -Daily weights -strict I/O's -Low salt diet -Fluid restriction -Obtain REDs Vest reading   HTN (hypertension) Patient is currently on Cardizem gtt. with some blood pressure She is on metoprolol for atrial fibrillation with rapid ventricular rate She is also on Lasix for CHF. Will monitor her blood pressures closely    Hyperlipidemia -Continue pravastatin   Atrial fibrillation with RVR [facility]):  Patient is currently not taking any anticoagulation her heart rate is trending towards normal on the Cardizem drip Continue Cardizem drip Continue metoprolol  Leukocytosis:   Patient had a slightly elevated WBC of 11.6 on admission with no clear source of infection patient denies any fever chills cough or congestion. Will monitor and recheck her CBC. -   Stage 3a chronic kidney disease ([place]): Stable,   Will follow her BMP and avoid nephrotoxic drugs   Type II diabetes mellitus with renal manifestations ([place]):  Type 2 diabetes mellitus with renal manifestation Recent hemoglobin A1c is 7.0 not well controlled Continue home glipizide Continue sliding scale   Stroke (HCC) -Aspirin, Plavix -Pravastatin   Code Status: DNR  Severity of Illness: Patient still requires inpatient treatment due to being on IV Cardizem   Family Communication: None at bedside  Disposition Plan: Home Status is: Inpatient   Dispo: The patient is from: Home              Anticipated d/c is to:               Anticipated d/c date is:  Patient currently not medically stable for discharge  Consultants: None  Procedures: None  Antimicrobials: None  DVT prophylaxis: Subcu Lovenox   Objective: Vitals:   10/31/21 2303 11/01/21 0519 11/01/21 0549 11/01/21 0720  BP: 102/68 (!) 87/59 92/61 97/63   Pulse: (!) 55 62    Resp: 20 19  18   Temp: 98.2 F (36.8 C) 98.3 F (36.8 C)  98.1 F (36.7 C)  TempSrc: Oral Oral  Oral  SpO2: 93% 93%  92%  Weight: 81.8 kg 81.2 kg     Height: 5\' 7"  (1.702 m)       Intake/Output Summary (Last 24 hours) at 11/01/2021 0824 Last data filed at 11/01/2021 0721 Gross per 24 hour  Intake 165.96 ml  Output 1000 ml  Net -834.04 ml   Filed Weights   10/31/21 1111 10/31/21 2303 11/01/21 0519  Weight: 82.4 kg 81.8 kg 81.2 kg   Body mass index is 28.05 kg/m.  Exam:  General: 87 y.o. year-old female well developed well nourished in no acute distress.  Alert and oriented x3. Cardiovascular: Regular rate and rhythm with no rubs or gallops.  No thyromegaly or JVD noted.   Respiratory: Clear to auscultation with no wheezes or rales. Good inspiratory effort. Abdomen: Soft nontender nondistended with normal bowel sounds x4 quadrants. Musculoskeletal: No lower extremity edema. 2/4 pulses in all 4 extremities. Skin: No ulcerative lesions noted or rashes, Psychiatry: Mood is appropriate for condition and setting Neurology:    Data Reviewed: CBC: Recent Labs  Lab 10/31/21 1114 10/31/21 1115  WBC 12.8* 11.6*  NEUTROABS  --  6.8  HGB 13.8 13.8  HCT 44.2 42.9  MCV 92.5 90.7  PLT 170 053   Basic Metabolic Panel: Recent Labs  Lab 10/31/21 1114 10/31/21 1115  NA 140 139  K 3.3* 4.5  CL 100 104  CO2 31 28  GLUCOSE 117* 151*  BUN 13 18  CREATININE 1.02* 1.13*  CALCIUM 9.2 9.8  MG 2.1 2.5*   GFR: Estimated Creatinine Clearance: 37 mL/min (A) (by C-G formula based on SCr of 1.13 mg/dL (H)). Liver Function Tests: Recent Labs  Lab 10/31/21 1115  AST 18  ALT 10  ALKPHOS 55  BILITOT 1.4*  PROT 7.5  ALBUMIN 4.1   No results for input(s): LIPASE, AMYLASE in the last 168 hours. No results for input(s): AMMONIA in the last 168 hours. Coagulation Profile: No results for input(s): INR, PROTIME in the last 168 hours. Cardiac Enzymes: No results for input(s): CKTOTAL, CKMB, CKMBINDEX, TROPONINI in the last 168 hours. BNP (last 3 results) No results for input(s): PROBNP in the last 8760 hours. HbA1C: No results for  input(s): HGBA1C in the last 72 hours. CBG: Recent Labs  Lab 10/31/21 1807 10/31/21 2312 11/01/21 0723  GLUCAP 149* 174* 109*   Lipid Profile: No results for input(s): CHOL, HDL, LDLCALC, TRIG, CHOLHDL, LDLDIRECT in the last 72 hours. Thyroid Function Tests: No results for input(s): TSH, T4TOTAL, FREET4, T3FREE, THYROIDAB in the last 72 hours. Anemia Panel: No results for input(s): VITAMINB12, FOLATE, FERRITIN, TIBC, IRON, RETICCTPCT in the last 72 hours. Urine analysis:    Component Value Date/Time   COLORURINE STRAW (A) 05/14/2021 1806   APPEARANCEUR CLEAR (A) 05/14/2021 1806   LABSPEC 1.004 (L) 05/14/2021 1806   PHURINE 7.0 05/14/2021 1806   GLUCOSEU NEGATIVE 05/14/2021 1806   HGBUR NEGATIVE 05/14/2021 1806   BILIRUBINUR NEGATIVE 05/14/2021 1806   KETONESUR NEGATIVE 05/14/2021 1806   PROTEINUR NEGATIVE 05/14/2021 1806   NITRITE NEGATIVE 05/14/2021  [place] (A) 05/14/2021 1806   Sepsis Labs: @LABRCNTIP (procalcitonin:4,lacticidven:4)  ) Recent Results (from the past 240 hour(s))  Resp Panel by RT-PCR (Flu A&B, Covid) Nasopharyngeal Swab     Status: None   Collection Time: 10/31/21 11:12 AM   Specimen: Nasopharyngeal Swab; Nasopharyngeal(NP) swabs in vial transport medium  Result Value Ref Range Status   SARS Coronavirus 2 by RT PCR NEGATIVE NEGATIVE Final    Comment: (NOTE) SARS-CoV-2 target nucleic acids are NOT DETECTED.  The SARS-CoV-2 RNA is generally detectable in upper respiratory specimens during the acute phase of infection. The lowest concentration of SARS-CoV-2 viral copies this assay can detect is 138 copies/mL. A negative result does not preclude SARS-Cov-2 infection and should not be used as the sole basis for treatment or other patient management decisions. A negative result may occur with  improper specimen collection/handling, submission of specimen other than nasopharyngeal swab, presence of viral mutation(s) within the areas  targeted by this assay, and inadequate number of viral copies(<138 copies/mL). A negative result must be combined with clinical observations, patient history, and epidemiological information. The expected result is Negative.  Fact Sheet for Patients:  EntrepreneurPulse.com.au  Fact Sheet for Healthcare Providers:  IncredibleEmployment.be  This test is no t yet approved or cleared by the [place] FDA and  has been authorized for detection and/or diagnosis of SARS-CoV-2 by FDA under an Emergency Use Authorization (EUA). This EUA will remain  in effect (meaning this test can be used) for the duration of the COVID-19 declaration under Section 564(b)(1) of the Act, 21 U.S.C.section 360bbb-3(b)(1), unless the authorization is terminated  or revoked sooner.       Influenza A by PCR NEGATIVE NEGATIVE Final   Influenza B by PCR NEGATIVE NEGATIVE Final    Comment: (NOTE) The Xpert Xpress SARS-CoV-2/FLU/RSV plus assay is intended as an aid in the diagnosis of influenza from Nasopharyngeal swab specimens and should not be used as a sole basis for treatment. Nasal washings and aspirates are unacceptable for Xpert Xpress SARS-CoV-2/FLU/RSV testing.  Fact Sheet for Patients: EntrepreneurPulse.com.au  Fact Sheet for Healthcare Providers: IncredibleEmployment.be  This test is not yet approved or cleared by the [place] FDA and has been authorized for detection and/or diagnosis of SARS-CoV-2 by FDA under an Emergency Use Authorization (EUA). This EUA will remain in effect (meaning this test can be used) for the duration of the COVID-19 declaration under Section 564(b)(1) of the Act, 21 U.S.C. section 360bbb-3(b)(1), unless the authorization is terminated or revoked.  Performed at [facility], [address] [place], [place] [address]       Studies: DG Chest Portable 1 View  Result Date:  10/31/2021 CLINICAL DATA:  Shortness of breath. Elevated heart rate. Evaluate for infiltrate or pulmonary congestion. EXAM: PORTABLE CHEST 1 VIEW COMPARISON:  05/14/2021 FINDINGS: Patient rotated right. Right shoulder arthroplasty. Midline trachea. Mild cardiomegaly. Atherosclerosis in the transverse aorta. Tiny bilateral pleural effusions. No pneumothorax. Pulmonary interstitial prominence and indistinctness, increased. Right greater than left mild subsegmental atelectasis at the bases. IMPRESSION: Cardiomegaly and mild pulmonary venous congestion with tiny bilateral pleural effusions and dependent atelectasis. Aortic Atherosclerosis (ICD10-I70.0). Electronically Signed   By: Abigail Miyamoto M.D.   On: 10/31/2021 11:34    Scheduled Meds:  aspirin  81 mg Oral Daily   clopidogrel  75 mg Oral Daily   enoxaparin (LOVENOX) injection  40 mg Subcutaneous Q24H   furosemide  40 mg Intravenous Q12H   insulin aspart  0-5 Units  Subcutaneous QHS   insulin aspart  0-9 Units Subcutaneous TID WC   metoprolol succinate  25 mg Oral Daily   pravastatin  20 mg Oral q1800    Continuous Infusions:  diltiazem (CARDIZEM) infusion 10 mg/hr (11/01/21 0737)     LOS: 1 day     Cristal Deer, MD [facility]  To reach me or the doctor on call, go to: [email or web address] Password [facility]  11/01/2021, 8:24 AM

## 2021-11-01 NOTE — Progress Notes (Signed)
CSW received consult for heart failure screen, messaged heart failure RN Jim[place].   [place], [place]

## 2021-11-02 ENCOUNTER — Inpatient Hospital Stay (HOSPITAL_COMMUNITY)
Admit: 2021-11-02 | Discharge: 2021-11-02 | Disposition: A | Discharge status: Home / Self Care | Payer: Medicare HMO | Attending: Internal Medicine | Admitting: Internal Medicine

## 2021-11-02 DIAGNOSIS — I5021 Acute systolic (congestive) heart failure: Secondary | ICD-10-CM

## 2021-11-02 DIAGNOSIS — I5023 Acute on chronic systolic (congestive) heart failure: Secondary | ICD-10-CM | POA: Diagnosis not present

## 2021-11-02 LAB — GLUCOSE, CAPILLARY
Glucose-Capillary: 111 mg/dL — ABNORMAL HIGH (ref 70–99)
Glucose-Capillary: 212 mg/dL — ABNORMAL HIGH (ref 70–99)
Glucose-Capillary: 123 mg/dL — ABNORMAL HIGH (ref 70–99)
Glucose-Capillary: 165 mg/dL — ABNORMAL HIGH (ref 70–99)

## 2021-11-02 LAB — ECHOCARDIOGRAM COMPLETE
Height: 67 in
S' Lateral: 2.55 cm
Weight: 2865.6 oz

## 2021-11-02 MED ORDER — SODIUM CHLORIDE 0.9 % IV SOLN: INTRAVENOUS | Status: DC

## 2021-11-02 NOTE — Progress Notes (Signed)
This RN ambulated patient around nurses station and back to room. Patient had no complications, no shortness of breath, no elevated HR and no complaints of pain. Patient back in chair at this time, purewick in place, chair alarm on and IV fluids running at this time.  Will continue to monitor closely.

## 2021-11-02 NOTE — Progress Notes (Signed)
Placed order per MD Kyung Bacca for NS at 52ml/hr

## 2021-11-02 NOTE — Progress Notes (Signed)
*  PRELIMINARY RESULTS* Echocardiogram 2D Echocardiogram has been performed.  Claretta Fraise 11/02/2021, 10:27 AM

## 2021-11-02 NOTE — Consult Note (Addendum)
[facility] Nurse ostomy consult note Stoma type/location: LLQ colostomy. Patient known to our team from an admission earlier this year Stomal assessment/size:  1 and 3/8 inch oval, flush ostomy with lumen incenter Peristomal assessment: clear, creases. Parastomal hernia. Treatment options for stomal/peristomal skin: skin barrier ring Output: brown stool Ostomy pouching: 1pc compressible convex pouch with skin barrier ring. Pouch is Kellie Simmering # [id number] and kin barrier ring is Kellie Simmering [phone] Education provided: None today Enrolled patient in [place] program: Yes, previously  Staff are provided with guidance regarding supplies and Orders are provided for the routine care of the ostomy should the patient not feel well enough to participate in self care.  [place] nursing team will not follow, but will remain available to this patient, the nursing and medical teams.  Please re-consult if needed. Thanks, Maudie Flakes, MSN, RN, [place], Arther Abbott  Pager# [phone]

## 2021-11-02 NOTE — Progress Notes (Signed)
PROGRESS NOTE  Heidi Whitaker XNA:[id number] DOB: 1929/09/08 DOA: 10/31/2021 PCP: McLean-Scocuzza, Nino Glow, MD  HPI/Recap of past 24 hours:  Heidi Whitaker is a 87 y.o. female with medical history significant for CHF with EF of 45 to 50%, hypertension, hyperlipidemia, type 2 diabetes mellitus, stroke, DVT, not on anticoagulation, hard of hearing, chronic kidney disease stage III AAA, peripheral vascular disease, history of nosebleed, chronic diarrhea, atrial fibrillation not on anticoagulation, status post colostomy due to 2 benign tumors per her daughter.  She presented to the emergency room for evaluation of shortness of breath and weakness.  Patient was seen by her cardiologist Dr. Towanda Malkin in clinic on the day of presentation and was found to have A. fib with rapid ventricular rate of 160 she was therefore sent to the emergency department for evaluation.  On arrival to the ED she was noted to be desaturating in the 88% on room air which improved to 92 to 95% with 2 L/min.     Subjective: November 01, 2021: Patient seen and examined at bedside . Patient denies any complaints.  She was noted to be in bed reading her Melanee Spry book Denies any chest pain  November 02, 2021: Patient seen and examined at bedside she complained that she feels a little tired she has a back pain from lying in bed Her Cardizem drip was discontinued yesterday because she was having good heart rate but it is starting to go up per nurse  Assessment/Plan: Principal Problem:   Acute on chronic systolic CHF (congestive heart failure) ([place]) Active Problems:   HTN (hypertension)   Hyperlipidemia   Atrial fibrillation with RVR ([place])   Leukocytosis   Stage 3a chronic kidney disease ([place])   Type II diabetes mellitus with renal manifestations ([place])   Stroke ([place])   Acute respiratory failure with hypoxia ([place])      Acute respiratory failure with hypoxia due acute on chronic systolic CHF (congestive heart  failure) ([place]):   2D echo on May 15, 2021 showed EF of 45 to 50% She has elevated BNP of 355. She also has cardiomegaly and vascular congestion on x-ray with positive JVD and +1 lower extremity edema bilaterally  -Lasix 40 mg bid by (received 60 mg of Lasix in ED) -2d echo -Daily weights -strict I/O's -Low salt diet -Fluid restriction -Obtain REDs Vest reading   HTN (hypertension) Patient is currently on Cardizem gtt. with some blood pressure She is on metoprolol for atrial fibrillation with rapid ventricular rate She is also on Lasix for CHF. Will monitor her blood pressures closely Her blood pressure is slightly on the low side I will order low-dose IV normal saline    Hyperlipidemia -Continue pravastatin   Atrial fibrillation with RVR ([place]):  Patient is currently not taking any anticoagulation her heart rate is trending towards normal on the Cardizem drip disContinue Cardizem drip Continue metoprolol I will start her on oral Cardizem 30 mg 3 times daily  Leukocytosis:   Patient had a slightly elevated WBC of 11.6 on admission with no clear source of infection patient denies any fever chills cough or congestion. Will monitor and recheck her CBC. -   Stage 3a chronic kidney disease ([place]): Stable,   Will follow her BMP and avoid nephrotoxic drugs   Type II diabetes mellitus with renal manifestations ([place]):  Type 2 diabetes mellitus with renal manifestation Recent hemoglobin A1c is 7.0 not well controlled Continue home glipizide Continue sliding scale   Stroke (HCC) -Aspirin, Plavix -Pravastatin  Code Status: DNR  Severity of Illness: Patient still requires inpatient treatment due to being on IV Cardizem   Family Communication: None at bedside  Disposition Plan: Home Status is: Inpatient   Dispo: The patient is from: Home              Anticipated d/c is to:               Anticipated d/c date is:               Patient currently not medically stable  for discharge  Consultants: None  Procedures: None  Antimicrobials: None  DVT prophylaxis: Subcu Lovenox   Objective: Vitals:   11/02/21 0824 11/02/21 1008 11/02/21 1212 11/02/21 1640  BP: 105/68  119/75 112/84  Pulse: 84 86 (!) 126 84  Resp: 17  18 17   Temp: 98.1 F (36.7 C)  97.8 F (36.6 C) 97.9 F (36.6 C)  TempSrc: Oral   Oral  SpO2: 93%  94% 99%  Weight:      Height:        Intake/Output Summary (Last 24 hours) at 11/02/2021 1737 Last data filed at 11/02/2021 1538 Gross per 24 hour  Intake 1660.59 ml  Output 2000 ml  Net -339.41 ml    Filed Weights   10/31/21 2303 11/01/21 0519 11/02/21 0500  Weight: 81.8 kg 81.2 kg 81 kg   Body mass index is 27.97 kg/m.  Exam:  General: 87 y.o. year-old female well developed well nourished in no acute distress.  Alert and oriented x3. Cardiovascular: Regular rate and rhythm with no rubs or gallops.  No thyromegaly or JVD noted.   Respiratory: Clear to auscultation with no wheezes or rales. Good inspiratory effort. Abdomen: Soft nontender nondistended with normal bowel sounds x4 quadrants. Musculoskeletal: No lower extremity edema. 2/4 pulses in all 4 extremities. Skin: No ulcerative lesions noted or rashes, Psychiatry: Mood is appropriate for condition and setting Neurology:    Data Reviewed: CBC: Recent Labs  Lab 10/31/21 1114 10/31/21 1115  WBC 12.8* 11.6*  NEUTROABS  --  6.8  HGB 13.8 13.8  HCT 44.2 42.9  MCV 92.5 90.7  PLT 170 086    Basic Metabolic Panel: Recent Labs  Lab 10/31/21 1114 10/31/21 1115  NA 140 139  K 3.3* 4.5  CL 100 104  CO2 31 28  GLUCOSE 117* 151*  BUN 13 18  CREATININE 1.02* 1.13*  CALCIUM 9.2 9.8  MG 2.1 2.5*    GFR: Estimated Creatinine Clearance: 37 mL/min (A) (by C-G formula based on SCr of 1.13 mg/dL (H)). Liver Function Tests: Recent Labs  Lab 10/31/21 1115  AST 18  ALT 10  ALKPHOS 55  BILITOT 1.4*  PROT 7.5  ALBUMIN 4.1    No results for input(s):  LIPASE, AMYLASE in the last 168 hours. No results for input(s): AMMONIA in the last 168 hours. Coagulation Profile: No results for input(s): INR, PROTIME in the last 168 hours. Cardiac Enzymes: No results for input(s): CKTOTAL, CKMB, CKMBINDEX, TROPONINI in the last 168 hours. BNP (last 3 results) No results for input(s): PROBNP in the last 8760 hours. HbA1C: No results for input(s): HGBA1C in the last 72 hours. CBG: Recent Labs  Lab 11/01/21 1614 11/01/21 2140 11/02/21 0726 11/02/21 1212 11/02/21 1620  GLUCAP 116* 178* 111* 212* 123*    Lipid Profile: No results for input(s): CHOL, HDL, LDLCALC, TRIG, CHOLHDL, LDLDIRECT in the last 72 hours. Thyroid Function Tests: No results for input(s): TSH, T4TOTAL, FREET4,  T3FREE, THYROIDAB in the last 72 hours. Anemia Panel: No results for input(s): VITAMINB12, FOLATE, FERRITIN, TIBC, IRON, RETICCTPCT in the last 72 hours. Urine analysis:    Component Value Date/Time   COLORURINE STRAW (A) 05/14/2021 1806   APPEARANCEUR CLEAR (A) 05/14/2021 1806   LABSPEC 1.004 (L) 05/14/2021 1806   PHURINE 7.0 05/14/2021 1806   GLUCOSEU NEGATIVE 05/14/2021 1806   HGBUR NEGATIVE 05/14/2021 1806   BILIRUBINUR NEGATIVE 05/14/2021 1806   KETONESUR NEGATIVE 05/14/2021 1806   PROTEINUR NEGATIVE 05/14/2021 1806   NITRITE NEGATIVE 05/14/2021 1806   LEUKOCYTESUR SMALL (A) 05/14/2021 1806   Sepsis Labs: @LABRCNTIP (procalcitonin:4,lacticidven:4)  ) Recent Results (from the past 240 hour(s))  Resp Panel by RT-PCR (Flu A&B, Covid) Nasopharyngeal Swab     Status: None   Collection Time: 10/31/21 11:12 AM   Specimen: Nasopharyngeal Swab; Nasopharyngeal(NP) swabs in vial transport medium  Result Value Ref Range Status   SARS Coronavirus 2 by RT PCR NEGATIVE NEGATIVE Final    Comment: (NOTE) SARS-CoV-2 target nucleic acids are NOT DETECTED.  The SARS-CoV-2 RNA is generally detectable in upper respiratory specimens during the acute phase of infection.  The lowest concentration of SARS-CoV-2 viral copies this assay can detect is 138 copies/mL. A negative result does not preclude SARS-Cov-2 infection and should not be used as the sole basis for treatment or other patient management decisions. A negative result may occur with  improper specimen collection/handling, submission of specimen other than nasopharyngeal swab, presence of viral mutation(s) within the areas targeted by this assay, and inadequate number of viral copies(<138 copies/mL). A negative result must be combined with clinical observations, patient history, and epidemiological information. The expected result is Negative.  Fact Sheet for Patients:  EntrepreneurPulse.com.au  Fact Sheet for Healthcare Providers:  IncredibleEmployment.be  This test is no t yet approved or cleared by the [place] FDA and  has been authorized for detection and/or diagnosis of SARS-CoV-2 by FDA under an Emergency Use Authorization (EUA). This EUA will remain  in effect (meaning this test can be used) for the duration of the COVID-19 declaration under Section 564(b)(1) of the Act, 21 U.S.C.section 360bbb-3(b)(1), unless the authorization is terminated  or revoked sooner.       Influenza A by PCR NEGATIVE NEGATIVE Final   Influenza B by PCR NEGATIVE NEGATIVE Final    Comment: (NOTE) The Xpert Xpress SARS-CoV-2/FLU/RSV plus assay is intended as an aid in the diagnosis of influenza from Nasopharyngeal swab specimens and should not be used as a sole basis for treatment. Nasal washings and aspirates are unacceptable for Xpert Xpress SARS-CoV-2/FLU/RSV testing.  Fact Sheet for Patients: EntrepreneurPulse.com.au  Fact Sheet for Healthcare Providers: IncredibleEmployment.be  This test is not yet approved or cleared by the [place] FDA and has been authorized for detection and/or diagnosis of SARS-CoV-2 by FDA  under an Emergency Use Authorization (EUA). This EUA will remain in effect (meaning this test can be used) for the duration of the COVID-19 declaration under Section 564(b)(1) of the Act, 21 U.S.C. section 360bbb-3(b)(1), unless the authorization is terminated or revoked.  Performed at [facility], [address] [place], [place] [address]       Studies: ECHOCARDIOGRAM COMPLETE  Result Date: 11/02/2021    ECHOCARDIOGRAM REPORT   Patient Name:   Heidi Whitaker Date of Exam: 11/02/2021 Medical Rec #:  [id number]           Height:       67.0 in Accession #:  [id number]          Weight:       179.1 lb Date of Birth:  Apr 01, 1929           BSA:          1.930 m Patient Age:    89 years            BP:           119/72 mmHg Patient Gender: F                   HR:           133 bpm. Exam Location:  [facility] Procedure: 2D Echo Indications:     CHF I50.21  History:         Patient has prior history of Echocardiogram examinations, most                  recent 05/15/2021.  Sonographer:     Kathlen Brunswick RDCS Referring Phys:  [id number] Soledad Gerlach NIU Diagnosing Phys: Kate Sable MD  Sonographer Comments: Technically difficult study due to poor echo windows. Image acquisition challenging due to uncooperative patient. IMPRESSIONS  1. Left ventricular ejection fraction, by estimation, is 35 to 40%. The left ventricle has moderately decreased function. Left ventricular endocardial border not optimally defined to evaluate regional wall motion. There is mild left ventricular hypertrophy. Left ventricular diastolic parameters are indeterminate.  2. Right ventricular systolic function is mildly reduced. The right ventricular size is normal.  3. Left atrial size was severely dilated.  4. The mitral valve is degenerative. No evidence of mitral valve regurgitation. Moderate mitral annular calcification.  5. The aortic valve is calcified. Aortic valve regurgitation is not visualized. Mild to moderate aortic valve  sclerosis/calcification is present, without any evidence of aortic stenosis. FINDINGS  Left Ventricle: Left ventricular ejection fraction, by estimation, is 35 to 40%. The left ventricle has moderately decreased function. Left ventricular endocardial border not optimally defined to evaluate regional wall motion. The left ventricular internal cavity size was normal in size. There is mild left ventricular hypertrophy. Left ventricular diastolic parameters are indeterminate. Right Ventricle: The right ventricular size is normal. No increase in right ventricular wall thickness. Right ventricular systolic function is mildly reduced. Left Atrium: Left atrial size was severely dilated. Right Atrium: Right atrial size was not well visualized. Pericardium: There is no evidence of pericardial effusion. Mitral Valve: The mitral valve is degenerative in appearance. There is mild thickening of the mitral valve leaflet(s). Moderate mitral annular calcification. No evidence of mitral valve regurgitation. Tricuspid Valve: The tricuspid valve is not well visualized. Tricuspid valve regurgitation is not demonstrated. Aortic Valve: The aortic valve is calcified. Aortic valve regurgitation is not visualized. Mild to moderate aortic valve sclerosis/calcification is present, without any evidence of aortic stenosis. Pulmonic Valve: The pulmonic valve was not well visualized. Pulmonic valve regurgitation is not visualized. Aorta: The aortic root is normal in size and structure. IAS/Shunts: The interatrial septum was not well visualized.  LEFT VENTRICLE PLAX 2D LVIDd:         3.15 cm LVIDs:         2.55 cm LV PW:         1.28 cm LV IVS:        1.09 cm LVOT diam:     1.70 cm LVOT Area:     2.27 cm  LEFT ATRIUM         Index LA diam:  3.90 cm 2.02 cm/m   AORTA Ao Root diam: 3.10 cm Ao Asc diam:  3.00 cm  SHUNTS Systemic Diam: 1.70 cm Kate Sable MD Electronically signed by Kate Sable MD Signature Date/Time: 11/02/2021/1:44:16  PM    Final     Scheduled Meds:  aspirin  81 mg Oral Daily   clopidogrel  75 mg Oral Daily   diltiazem  30 mg Oral TID   enoxaparin (LOVENOX) injection  40 mg Subcutaneous Q24H   furosemide  40 mg Intravenous Q12H   influenza vaccine adjuvanted  0.5 mL Intramuscular Tomorrow-1000   insulin aspart  0-5 Units Subcutaneous QHS   insulin aspart  0-9 Units Subcutaneous TID WC   metoprolol succinate  25 mg Oral Daily   pravastatin  20 mg Oral q1800    Continuous Infusions:  sodium chloride 75 mL/hr at 11/02/21 1538     LOS: 2 days     Cristal Deer, MD [facility]  To reach me or the doctor on call, go to: [email or web address] Password [facility]  11/02/2021, 5:37 PM

## 2021-11-02 NOTE — Progress Notes (Signed)
SBP 90-100 DBP 60-70s105 MAP 70-80s  Vitals rechecked and prior to medication administration.   Metoprolol held and MD notified per secure chat.   Cardizem given for HR reaching 140s non sustaining.   Patient resting in bed comfortably at this time with all needs met.   Will continue to monitor closely.   Purewick in place at this time with 690m urine emptied and documented in flowchart.

## 2021-11-02 NOTE — Progress Notes (Signed)
   11/02/21 0737  Assess: MEWS Score  Temp 97.6 F (36.4 C)  BP 97/76  Pulse Rate (!) 109  Resp 18  SpO2 96 %  O2 Device Nasal Cannula  O2 Flow Rate (L/min) 3 L/min  Assess: MEWS Score  MEWS Temp 0  MEWS Systolic 1  MEWS Pulse 1  MEWS RR 0  MEWS LOC 0  MEWS Score 2  MEWS Score Color Yellow  Assess: if the MEWS score is Yellow or Red  Were vital signs taken at a resting state? Yes  Focused Assessment Change from prior assessment (see assessment flowsheet)  Does the patient meet 2 or more of the SIRS criteria? No  MEWS guidelines implemented *See Row Information* Yes  Treat  MEWS Interventions Escalated (See documentation below)  Pain Scale 0-10  Pain Score 0  Take Vital Signs  Increase Vital Sign Frequency  Yellow: Q 2hr X 2 then Q 4hr X 2, if remains yellow, continue Q 4hrs  Escalate  MEWS: Escalate Yellow: discuss with charge nurse/RN and consider discussing with provider and RRT  Notify: Charge Nurse/RN  Name of Charge Nurse/RN Notified Jonnie Kind RN  Date Charge Nurse/RN Notified 11/02/21  Time Charge Nurse/RN Notified [phone]  Document  Patient Outcome Other (Comment) (Continue to monitor)  Progress note created (see row info) Yes  Assess: SIRS CRITERIA  SIRS Temperature  0  SIRS Pulse 1  SIRS Respirations  0  SIRS WBC 0  SIRS Score Sum  1

## 2021-11-03 DIAGNOSIS — I5023 Acute on chronic systolic (congestive) heart failure: Secondary | ICD-10-CM | POA: Diagnosis not present

## 2021-11-03 LAB — BASIC METABOLIC PANEL
Anion gap: 16 — ABNORMAL HIGH (ref 5–15)
BUN: 25 mg/dL — ABNORMAL HIGH (ref 8–23)
CO2: 28 mmol/L (ref 22–32)
Calcium: 9.6 mg/dL (ref 8.9–10.3)
Chloride: 101 mmol/L (ref 98–111)
Creatinine, Ser: 1.04 mg/dL — ABNORMAL HIGH (ref 0.44–1.00)
GFR, Estimated: 51 mL/min — ABNORMAL LOW (ref >60)
Glucose, Bld: 128 mg/dL — ABNORMAL HIGH (ref 70–99)
Potassium: 3.6 mmol/L (ref 3.5–5.1)
Sodium: 145 mmol/L (ref 135–145)

## 2021-11-03 LAB — CBC WITH DIFFERENTIAL/PLATELET
Abs Immature Granulocytes: 0.05 10*3/uL (ref 0.00–0.07)
Basophils Absolute: 0.1 10*3/uL (ref 0.0–0.1)
Basophils Relative: 0 %
Eosinophils Absolute: 0.4 10*3/uL (ref 0.0–0.5)
Eosinophils Relative: 3 %
HCT: 40.7 % (ref 36.0–46.0)
Hemoglobin: 13.2 g/dL (ref 12.0–15.0)
Immature Granulocytes: 0 %
Lymphocytes Relative: 26 %
Lymphs Abs: 3.6 10*3/uL (ref 0.7–4.0)
MCH: 29.4 pg (ref 26.0–34.0)
MCHC: 32.4 g/dL (ref 30.0–36.0)
MCV: 90.6 fL (ref 80.0–100.0)
Monocytes Absolute: 1.9 10*3/uL — ABNORMAL HIGH (ref 0.1–1.0)
Monocytes Relative: 14 %
Neutro Abs: 7.8 10*3/uL — ABNORMAL HIGH (ref 1.7–7.7)
Neutrophils Relative %: 57 %
Platelets: 306 10*3/uL (ref 150–400)
RBC: 4.49 MIL/uL (ref 3.87–5.11)
RDW: 14.2 % (ref 11.5–15.5)
WBC: 13.8 10*3/uL — ABNORMAL HIGH (ref 4.0–10.5)
nRBC: 0 % (ref 0.0–0.2)

## 2021-11-03 LAB — GLUCOSE, CAPILLARY
Glucose-Capillary: 145 mg/dL — ABNORMAL HIGH (ref 70–99)
Glucose-Capillary: 184 mg/dL — ABNORMAL HIGH (ref 70–99)
Glucose-Capillary: 169 mg/dL — ABNORMAL HIGH (ref 70–99)
Glucose-Capillary: 134 mg/dL — ABNORMAL HIGH (ref 70–99)

## 2021-11-03 MED ORDER — DILTIAZEM HCL ER COATED BEADS 120 MG PO CP24
120.0000 mg | ORAL_CAPSULE | Freq: Every day | ORAL | Status: DC
  Administered 2021-11-03 – 2021-11-04 (×2): 120 mg via ORAL
  Filled 2021-11-03 (×2): qty 1

## 2021-11-03 NOTE — Care Management Important Message (Signed)
Important Message  Patient Details  Name: Heidi Whitaker MRN: [id number] Date of Birth: 1929-02-11   Medicare Important Message Given:  Yes     Dannette Barbara 11/03/2021, 4:10 PM

## 2021-11-03 NOTE — Evaluation (Signed)
Physical Therapy Evaluation Patient Details Name: Heidi Whitaker MRN: [id number] DOB: 05/09/1929 Today's Date: 11/03/2021  History of Present Illness  Pt is an 87 y/o F admitted on 10/31/21 with c/c of SOB & weakness. Pt was seen in cardiology clinic that day & found to have a-fib with RVR. Pt is being treated for acute respiratory failure with hypoxia 2/2 acute on chronic CHF. PMH: CHF with EF 45-50%, HTN, HLD, DM2, stroke, DVT, not on anticoagulation, HOH, CKD stage 3, AAA, PVD, a-fib, s/p colostomy  Clinical Impression  Pt seen for PT evaluation with pt reporting she was independent with mobility without AD (son-in-law provides supervision for stair negotiation to bed/bath on 2nd level). On this date, pt requires CGA overall for transfers & gait into hallway with global weakness appreciated. Pt incontinent of urine upon each sit>stand, requiring physical assistance to don clean underwear & pt noting fatigue after gait & ADLs. Will continue to follow pt acutely to progress gait, endurance, & stair negotiation.   Pt received on 2.5L/min via nasal cannula with SPO2 >90%. Pt placed on room air & maintained >/= 91% throughout session, pt left on room air & nurse notified. Max HR observed 128 bpm.       Recommendations for follow up therapy are one component of a multi-disciplinary discharge planning process, led by the attending physician.  Recommendations may be updated based on patient status, additional functional criteria and insurance authorization.  Follow Up Recommendations Home health PT    Assistance Recommended at Discharge Frequent or constant Supervision/Assistance  Functional Status Assessment Patient has had a recent decline in their functional status and demonstrates the ability to make significant improvements in function in a reasonable and predictable amount of time.  Equipment Recommendations  Rolling walker (2 wheels)    Recommendations for Other Services        Precautions / Restrictions Precautions Precautions: Fall Restrictions Weight Bearing Restrictions: No      Mobility  Bed Mobility Overal bed mobility: Needs Assistance Bed Mobility: Supine to Sit     Supine to sit: Supervision;HOB elevated          Transfers Overall transfer level: Needs assistance   Transfers: Sit to/from Stand Sit to Stand: Min guard                Ambulation/Gait Ambulation/Gait assistance: Min guard Gait Distance (Feet): 125 Feet Assistive device: None Gait Pattern/deviations: Staggering left;Staggering right Gait velocity: decreased     General Gait Details: Pt endorses feeling "wobbly" 2/2 not being OOB for a couple of days. No significant LOB but staggering L<>R & requiring CGA.  Stairs            Wheelchair Mobility    Modified Rankin (Stroke Patients Only)       Balance Overall balance assessment: Needs assistance Sitting-balance support: Feet supported;Single extremity supported Sitting balance-Leahy Scale: Good     Standing balance support: No upper extremity supported;During functional activity Standing balance-Leahy Scale: Fair                               Pertinent Vitals/Pain Pain Assessment: No/denies pain    Home Living Family/patient expects to be discharged to:: Private residence Living Arrangements: Children Available Help at Discharge: Family;Available 24 hours/day Type of Home: House Home Access: Stairs to enter Entrance Stairs-Rails: Left;Right (wideset) Entrance Stairs-Number of Steps: pt reports 3, but chart states 4-5 Alternate Level Stairs-Number of Steps: flight  Home Layout: Two level;Bed/bath upstairs Home Equipment:  (may have a RW in storage)      Prior Function Prior Level of Function : Independent/Modified Independent             Mobility Comments: Pt reports independence without AD prior to admission, denies falls.       Hand Dominance         Extremity/Trunk Assessment   Upper Extremity Assessment Upper Extremity Assessment: Generalized weakness    Lower Extremity Assessment Lower Extremity Assessment: Generalized weakness       Communication   Communication: HOH (has hearing aides)  Cognition Arousal/Alertness: Awake/alert Behavior During Therapy: WFL for tasks assessed/performed Overall Cognitive Status: Within Functional Limits for tasks assessed                                          General Comments General comments (skin integrity, edema, etc.): Pt incontinent of urine upon each sit>stand (reports she wears a pull up at home) & requires assistance for changing mesh underwear (PT threads on BLE).    Exercises     Assessment/Plan    PT Assessment Patient needs continued PT services  PT Problem List Decreased strength;Decreased mobility;Decreased balance;Decreased activity tolerance;Cardiopulmonary status limiting activity;Decreased knowledge of use of DME       PT Treatment Interventions DME instruction;Therapeutic activities;Gait training;Therapeutic exercise;Patient/family education;Stair training;Balance training;Functional mobility training;Neuromuscular re-education    PT Goals (Current goals can be found in the Care Plan section)  Acute Rehab PT Goals Patient Stated Goal: get better PT Goal Formulation: With patient Time For Goal Achievement: 11/17/21 Potential to Achieve Goals: Good    Frequency Min 2X/week   Barriers to discharge        Co-evaluation               AM-PAC PT "6 Clicks" Mobility  Outcome Measure Help needed turning from your back to your side while in a flat bed without using bedrails?: None Help needed moving from lying on your back to sitting on the side of a flat bed without using bedrails?: None Help needed moving to and from a bed to a chair (including a wheelchair)?: A Little Help needed standing up from a chair using your arms (e.g.,  wheelchair or bedside chair)?: A Little Help needed to walk in hospital room?: A Little Help needed climbing 3-5 steps with a railing? : A Little 6 Click Score: 20    End of Session   Activity Tolerance: Patient tolerated treatment well;Patient limited by fatigue Patient left: in chair;with chair alarm set;with call bell/phone within reach Nurse Communication: Mobility status (O2) PT Visit Diagnosis: Muscle weakness (generalized) (M62.81);Unsteadiness on feet (R26.81)    Time: 2956-2130 PT Time Calculation (min) (ACUTE ONLY): 22 min   Charges:   PT Evaluation $PT Eval Low Complexity: 1 Low PT Treatments $Therapeutic Activity: 8-22 mins        Lavone Nian, PT, DPT 11/03/21, 10:02 AM   Waunita Schooner 11/03/2021, 9:59 AM

## 2021-11-03 NOTE — Progress Notes (Signed)
PROGRESS NOTE    Heidi Whitaker  ZSW:[id number] DOB: Feb 17, 1929 DOA: 10/31/2021 PCP: McLean-Scocuzza, Nino Glow, MD    Brief Narrative:  87 y.o. female with medical history significant for CHF with EF of 45 to 50%, hypertension, hyperlipidemia, type 2 diabetes mellitus, stroke, DVT, not on anticoagulation, hard of hearing, chronic kidney disease stage III AAA, peripheral vascular disease, history of nosebleed, chronic diarrhea, atrial fibrillation not on anticoagulation, status post colostomy due to 2 benign tumors per her daughter.  She presented to the emergency room for evaluation of shortness of breath and weakness.  Patient was seen by her cardiologist Dr. Towanda Malkin in clinic on the day of presentation and was found to have A. fib with rapid ventricular rate of 160 she was therefore sent to the emergency department for evaluation.  On arrival to the ED she was noted to be desaturating in the 88% on room air which improved to 92 to 95% with 2 L/min.   Assessment & Plan:   Principal Problem:   Acute on chronic systolic CHF (congestive heart failure) (HCC) Active Problems:   HTN (hypertension)   Hyperlipidemia   Atrial fibrillation with RVR (HCC)   Leukocytosis   Stage 3a chronic kidney disease (HCC)   Type II diabetes mellitus with renal manifestations (HCC)   Stroke (HCC)   Acute respiratory failure with hypoxia (HCC)  Acute on chronic systolic congestive heart failure Acute hypoxic respiratory failure secondary to above Previous echocardiogram May 2022 EF 45 to 50%.   Repeat echocardiogram EF worsened at 35-40% Started on IV diuretic, has been responding appropriately Plan: Continue IV Lasix 40 mg twice daily Continue supplemental oxygen, wean as tolerated Target net -1-1.5 L daily Daily weights Strict intake and output Fluid restricted low-sodium diet  Essential hypertension Blood pressure controlled Plan: Long-acting Cardizem, started 11/7 Continue Lasix as  above PTA metoprolol  Atrial fibrillation rapid ventricular response Not on anticoagulation Cardizem drip discontinued Heart rate controlled Plan: Cardizem CD 120 mg daily PTA metoprolol  Leukocytosis Suspect reactive No clear source of infection Monitor vitals and fever curve No indication for antibiotics  Stage IIIa chronic kidney disease Creatinine stable Monitor BMP while on diuretics  Type 2 diabetes mellitus with renal manifestations Blood sugar controlled Continue SSI glipizide  History of CVA PTA aspirin and Plavix PTA pravastatin   DVT prophylaxis: SQ Lovenox Code Status: DNR Family Communication: Daughter [name] [phone] on 11/7 Disposition Plan: Status is: Inpatient  Remains inpatient appropriate because: Decompensated failure and breakthrough atrial fibrillation with rapid ventricular response.  Anticipate 1 to 2 days prior to disposition.  Anticipate discharge home with home health services.      Level of care: Progressive Cardiac  Consultants:  None  Procedures:  None  Antimicrobials: None   Subjective: Seen and examined.  Endorses improvement in shortness of breath over interval.  No pain complaints.  Objective: Vitals:   11/03/21 0000 11/03/21 0400 11/03/21 0500 11/03/21 0729  BP: 92/67 (!) 94/59  117/63  Pulse: 81 98  66  Resp:  20  20  Temp: 98.2 F (36.8 C) 98.4 F (36.9 C)  (!) 97.5 F (36.4 C)  TempSrc:      SpO2: 91% 91%  95%  Weight:   72.3 kg   Height:        Intake/Output Summary (Last 24 hours) at 11/03/2021 1141 Last data filed at 11/03/2021 0900 Gross per 24 hour  Intake 2174.22 ml  Output 1500 ml  Net 674.22 ml   Filed  Weights   11/01/21 0519 11/02/21 0500 11/03/21 0500  Weight: 81.2 kg 81 kg 72.3 kg    Examination:  General exam: Appears calm and comfortable  Respiratory system: Bibasilar crackles.  Normal work of breathing.  2 L Cardiovascular system: S1-S2, RRR, no murmurs, trace pedal  edema Gastrointestinal system: Abdomen is nondistended, soft and nontender. No organomegaly or masses felt. Normal bowel sounds heard. Central nervous system: Alert and oriented. No focal neurological deficits. Extremities: Symmetric 5 x 5 power. Skin: No rashes, lesions or ulcers Psychiatry: Judgement and insight appear normal. Mood & affect appropriate.     Data Reviewed: I have personally reviewed following labs and imaging studies  CBC: Recent Labs  Lab 10/31/21 1114 10/31/21 1115 11/03/21 0417  WBC 12.8* 11.6* 13.8*  NEUTROABS  --  6.8 7.8*  HGB 13.8 13.8 13.2  HCT 44.2 42.9 40.7  MCV 92.5 90.7 90.6  PLT 170 332 161   Basic Metabolic Panel: Recent Labs  Lab 10/31/21 1114 10/31/21 1115 11/03/21 0417  NA 140 139 145  K 3.3* 4.5 3.6  CL 100 104 101  CO2 31 28 28   GLUCOSE 117* 151* 128*  BUN 13 18 25*  CREATININE 1.02* 1.13* 1.04*  CALCIUM 9.2 9.8 9.6  MG 2.1 2.5*  --    GFR: Estimated Creatinine Clearance: 35.7 mL/min (A) (by C-G formula based on SCr of 1.04 mg/dL (H)). Liver Function Tests: Recent Labs  Lab 10/31/21 1115  AST 18  ALT 10  ALKPHOS 55  BILITOT 1.4*  PROT 7.5  ALBUMIN 4.1   No results for input(s): LIPASE, AMYLASE in the last 168 hours. No results for input(s): AMMONIA in the last 168 hours. Coagulation Profile: No results for input(s): INR, PROTIME in the last 168 hours. Cardiac Enzymes: No results for input(s): CKTOTAL, CKMB, CKMBINDEX, TROPONINI in the last 168 hours. BNP (last 3 results) No results for input(s): PROBNP in the last 8760 hours. HbA1C: No results for input(s): HGBA1C in the last 72 hours. CBG: Recent Labs  Lab 11/02/21 0726 11/02/21 1212 11/02/21 1620 11/02/21 2133 11/03/21 0809  GLUCAP 111* 212* 123* 165* 145*   Lipid Profile: No results for input(s): CHOL, HDL, LDLCALC, TRIG, CHOLHDL, LDLDIRECT in the last 72 hours. Thyroid Function Tests: No results for input(s): TSH, T4TOTAL, FREET4, T3FREE, THYROIDAB in  the last 72 hours. Anemia Panel: No results for input(s): VITAMINB12, FOLATE, FERRITIN, TIBC, IRON, RETICCTPCT in the last 72 hours. Sepsis Labs: No results for input(s): PROCALCITON, LATICACIDVEN in the last 168 hours.  Recent Results (from the past 240 hour(s))  Resp Panel by RT-PCR (Flu A&B, Covid) Nasopharyngeal Swab     Status: None   Collection Time: 10/31/21 11:12 AM   Specimen: Nasopharyngeal Swab; Nasopharyngeal(NP) swabs in vial transport medium  Result Value Ref Range Status   SARS Coronavirus 2 by RT PCR NEGATIVE NEGATIVE Final    Comment: (NOTE) SARS-CoV-2 target nucleic acids are NOT DETECTED.  The SARS-CoV-2 RNA is generally detectable in upper respiratory specimens during the acute phase of infection. The lowest concentration of SARS-CoV-2 viral copies this assay can detect is 138 copies/mL. A negative result does not preclude SARS-Cov-2 infection and should not be used as the sole basis for treatment or other patient management decisions. A negative result may occur with  improper specimen collection/handling, submission of specimen other than nasopharyngeal swab, presence of viral mutation(s) within the areas targeted by this assay, and inadequate number of viral copies(<138 copies/mL). A negative result must be combined with  clinical observations, patient history, and epidemiological information. The expected result is Negative.  Fact Sheet for Patients:  EntrepreneurPulse.com.au  Fact Sheet for Healthcare Providers:  IncredibleEmployment.be  This test is no t yet approved or cleared by the [place] FDA and  has been authorized for detection and/or diagnosis of SARS-CoV-2 by FDA under an Emergency Use Authorization (EUA). This EUA will remain  in effect (meaning this test can be used) for the duration of the COVID-19 declaration under Section 564(b)(1) of the Act, 21 U.S.C.section 360bbb-3(b)(1), unless the  authorization is terminated  or revoked sooner.       Influenza A by PCR NEGATIVE NEGATIVE Final   Influenza B by PCR NEGATIVE NEGATIVE Final    Comment: (NOTE) The Xpert Xpress SARS-CoV-2/FLU/RSV plus assay is intended as an aid in the diagnosis of influenza from Nasopharyngeal swab specimens and should not be used as a sole basis for treatment. Nasal washings and aspirates are unacceptable for Xpert Xpress SARS-CoV-2/FLU/RSV testing.  Fact Sheet for Patients: EntrepreneurPulse.com.au  Fact Sheet for Healthcare Providers: IncredibleEmployment.be  This test is not yet approved or cleared by the [place] FDA and has been authorized for detection and/or diagnosis of SARS-CoV-2 by FDA under an Emergency Use Authorization (EUA). This EUA will remain in effect (meaning this test can be used) for the duration of the COVID-19 declaration under Section 564(b)(1) of the Act, 21 U.S.C. section 360bbb-3(b)(1), unless the authorization is terminated or revoked.  Performed at [facility], [address], [place], [place] [address]          Radiology Studies: ECHOCARDIOGRAM COMPLETE  Result Date: 11/02/2021    ECHOCARDIOGRAM REPORT   Patient Name:   Heidi Whitaker Date of Exam: 11/02/2021 Medical Rec #:  [id number]           Height:       67.0 in Accession #:    [id number]          Weight:       179.1 lb Date of Birth:  07-May-1929           BSA:          1.930 m Patient Age:    75 years            BP:           119/72 mmHg Patient Gender: F                   HR:           133 bpm. Exam Location:  [facility] Procedure: 2D Echo Indications:     CHF I50.21  History:         Patient has prior history of Echocardiogram examinations, most                  recent 05/15/2021.  Sonographer:     Kathlen Brunswick RDCS Referring Phys:  [id number] Soledad Gerlach NIU Diagnosing Phys: Kate Sable MD  Sonographer Comments: Technically difficult study due to poor echo  windows. Image acquisition challenging due to uncooperative patient. IMPRESSIONS  1. Left ventricular ejection fraction, by estimation, is 35 to 40%. The left ventricle has moderately decreased function. Left ventricular endocardial border not optimally defined to evaluate regional wall motion. There is mild left ventricular hypertrophy. Left ventricular diastolic parameters are indeterminate.  2. Right ventricular systolic function is mildly reduced. The right ventricular size is normal.  3. Left atrial size was severely dilated.  4. The mitral valve  is degenerative. No evidence of mitral valve regurgitation. Moderate mitral annular calcification.  5. The aortic valve is calcified. Aortic valve regurgitation is not visualized. Mild to moderate aortic valve sclerosis/calcification is present, without any evidence of aortic stenosis. FINDINGS  Left Ventricle: Left ventricular ejection fraction, by estimation, is 35 to 40%. The left ventricle has moderately decreased function. Left ventricular endocardial border not optimally defined to evaluate regional wall motion. The left ventricular internal cavity size was normal in size. There is mild left ventricular hypertrophy. Left ventricular diastolic parameters are indeterminate. Right Ventricle: The right ventricular size is normal. No increase in right ventricular wall thickness. Right ventricular systolic function is mildly reduced. Left Atrium: Left atrial size was severely dilated. Right Atrium: Right atrial size was not well visualized. Pericardium: There is no evidence of pericardial effusion. Mitral Valve: The mitral valve is degenerative in appearance. There is mild thickening of the mitral valve leaflet(s). Moderate mitral annular calcification. No evidence of mitral valve regurgitation. Tricuspid Valve: The tricuspid valve is not well visualized. Tricuspid valve regurgitation is not demonstrated. Aortic Valve: The aortic valve is calcified. Aortic valve  regurgitation is not visualized. Mild to moderate aortic valve sclerosis/calcification is present, without any evidence of aortic stenosis. Pulmonic Valve: The pulmonic valve was not well visualized. Pulmonic valve regurgitation is not visualized. Aorta: The aortic root is normal in size and structure. IAS/Shunts: The interatrial septum was not well visualized.  LEFT VENTRICLE PLAX 2D LVIDd:         3.15 cm LVIDs:         2.55 cm LV PW:         1.28 cm LV IVS:        1.09 cm LVOT diam:     1.70 cm LVOT Area:     2.27 cm  LEFT ATRIUM         Index LA diam:    3.90 cm 2.02 cm/m   AORTA Ao Root diam: 3.10 cm Ao Asc diam:  3.00 cm  SHUNTS Systemic Diam: 1.70 cm Kate Sable MD Electronically signed by Kate Sable MD Signature Date/Time: 11/02/2021/1:44:16 PM    Final         Scheduled Meds:  aspirin  81 mg Oral Daily   clopidogrel  75 mg Oral Daily   diltiazem  120 mg Oral Daily   enoxaparin (LOVENOX) injection  40 mg Subcutaneous Q24H   furosemide  40 mg Intravenous Q12H   influenza vaccine adjuvanted  0.5 mL Intramuscular Tomorrow-1000   insulin aspart  0-5 Units Subcutaneous QHS   insulin aspart  0-9 Units Subcutaneous TID WC   metoprolol succinate  25 mg Oral Daily   pravastatin  20 mg Oral q1800   Continuous Infusions:   LOS: 3 days    Time spent: 35 minutes    Sidney Ace, MD [facility]   If 7PM-7AM, please contact night-coverage  11/03/2021, 11:41 AM

## 2021-11-03 NOTE — Evaluation (Signed)
Occupational Therapy Evaluation Patient Details Name: Heidi Whitaker MRN: [id number] DOB: 1929-11-29 Today's Date: 11/03/2021   History of Present Illness Pt is an 87 y/o F admitted on 10/31/21 with c/c of SOB & weakness. Pt was seen in cardiology clinic that day & found to have a-fib with RVR. Pt is being treated for acute respiratory failure with hypoxia 2/2 acute on chronic CHF. PMH: CHF with EF 45-50%, HTN, HLD, DM2, stroke, DVT, not on anticoagulation, HOH, CKD stage 3, AAA, PVD, a-fib, s/p colostomy   Clinical Impression   Patient presenting with decreased Ind in self care, balance, functional mobility/transfers, endurance, and safety awareness. Patient reports living at home with daughter and son in law at baseline. She reports not using AD and is Ind in self care and performs some IADL tasks at home PTA.Pt's family assist her to appointments since she no longer drives.  Patient currently functioning at min guard without use of AD. She is very concerned over urinary incontinence. OT discussed various ways to address that at home in order to increase Ind and also decrease fall risk.  Patient will benefit from acute OT to increase overall independence in the areas of ADLs, functional mobility, and safety awareness in order to safely discharge home with family.      Recommendations for follow up therapy are one component of a multi-disciplinary discharge planning process, led by the attending physician.  Recommendations may be updated based on patient status, additional functional criteria and insurance authorization.   Follow Up Recommendations  Home health OT    Assistance Recommended at Discharge Intermittent Supervision/Assistance  Functional Status Assessment  Patient has had a recent decline in their functional status and demonstrates the ability to make significant improvements in function in a reasonable and predictable amount of time.  Equipment Recommendations  BSC/3in1        Precautions / Restrictions Precautions Precautions: Fall      Mobility Bed Mobility Overal bed mobility: Needs Assistance Bed Mobility: Supine to Sit     Supine to sit: Supervision;HOB elevated          Transfers Overall transfer level: Needs assistance   Transfers: Sit to/from Stand Sit to Stand: Min guard                  Balance Overall balance assessment: Needs assistance Sitting-balance support: Feet supported;Single extremity supported Sitting balance-Leahy Scale: Good     Standing balance support: No upper extremity supported;During functional activity Standing balance-Leahy Scale: Fair                             ADL either performed or assessed with clinical judgement   ADL                                         General ADL Comments: min guard for functional mobility     Vision Baseline Vision/History: 1 Wears glasses Patient Visual Report: No change from baseline              Pertinent Vitals/Pain Pain Assessment: No/denies pain     Hand Dominance Right   Extremity/Trunk Assessment Upper Extremity Assessment Upper Extremity Assessment: Generalized weakness   Lower Extremity Assessment Lower Extremity Assessment: Generalized weakness       Communication Communication Communication: HOH   Cognition Arousal/Alertness: Awake/alert Behavior During Therapy: [facility]  for tasks assessed/performed Overall Cognitive Status: Within Functional Limits for tasks assessed                                                  Home Living Family/patient expects to be discharged to:: Private residence Living Arrangements: Children Available Help at Discharge: Family;Available 24 hours/day Type of Home: House Home Access: Stairs to enter [employer] of Steps: pt reports 3, but chart states 4-5 Entrance Stairs-Rails: Left;Right Home Layout: Two level;Bed/bath upstairs Alternate Level  Stairs-Number of Steps: flight Alternate Level Stairs-Rails: Left Bathroom Shower/Tub: Occupational psychologist: Standard     Home Equipment: Conservation officer, nature (2 wheels);Other (comment) (may have AD in storage)          Prior Functioning/Environment Prior Level of Function : Independent/Modified Independent             Mobility Comments: Pt reports independence without AD prior to admission, denies falls. ADLs Comments: Pt reports performing all ADLs without assistance. She does laundry and some light cooking at home without assist.        OT Problem List: Decreased strength;Decreased activity tolerance;Impaired balance (sitting and/or standing);Decreased safety awareness      OT Treatment/Interventions: Self-care/ADL training;Therapeutic exercise;Therapeutic activities;Energy conservation;DME and/or AE instruction;Patient/family education;Balance training    OT Goals(Current goals can be found in the care plan section) Acute Rehab OT Goals Patient Stated Goal: to return home OT Goal Formulation: With patient Time For Goal Achievement: 11/17/21 Potential to Achieve Goals: Good ADL Goals Pt Will Perform Grooming: Independently;standing Pt Will Perform Lower Body Dressing: Independently Pt Will Transfer to Toilet: Independently Pt Will Perform Toileting - Clothing Manipulation and hygiene: Independently  OT Frequency: Min 2X/week   Barriers to D/C:    none known at this time          AM-PAC OT "6 Clicks" Daily Activity     Outcome Measure Help from another person eating meals?: None Help from another person taking care of personal grooming?: None Help from another person toileting, which includes using toliet, bedpan, or urinal?: A Little Help from another person bathing (including washing, rinsing, drying)?: A Little Help from another person to put on and taking off regular upper body clothing?: None Help from another person to put on and taking off  regular lower body clothing?: A Little 6 Click Score: 21   End of Session Nurse Communication: Mobility status  Activity Tolerance: Patient tolerated treatment well Patient left: in bed;with call bell/phone within reach;with bed alarm set  OT Visit Diagnosis: Unsteadiness on feet (R26.81);Repeated falls (R29.6);Muscle weakness (generalized) (M62.81)                Time: 0258-5277 OT Time Calculation (min): 16 min Charges:  OT General Charges $OT Visit: 1 Visit OT Evaluation $OT Eval Low Complexity: 1 Low OT Treatments $Self Care/Home Management : 8-22 mins  Darleen Crocker, MS, OTR/L , CBIS ascom [phone]  11/03/21, 3:26 PM

## 2021-11-03 NOTE — Consult Note (Signed)
   Heart Failure Nurse Navigator Note  HFrEF 35 to 40%.  Mild LVH.  Mildly decreased right ventricular systolic function and severe left atrial enlargement.  Ductions fraction previously recorded at 45 to 50%.  She presented to the emergency room with complaints of shortness of breath and weakness.  She was also noted to be in A. fib with RVR with rates of 160.   Comorbidities:  Hypertension Hyperlipidemia Atrial fibrillation with RVR Chronic kidney disease stage III Type 2 diabetes  Medications:  Aspirin 81 mg daily Plavix 75 mg daily Cardizem CD 120 mg daily Furosemide 40 mg IV twice a day Metoprolol succinate 50 mg daily Pravachol 20 mg daily  Labs:   Sodium 145, potassium 3.6, chloride 101, CO2 28, BUN 25, creatinine 1.04 Intake 1180 Output 2100 Weight 72.3 kg down from 81 kg from yesterday  Initial meeting with patient today, she was sitting up at the bedside in the chair in no acute distress.  States that she might be discharged tomorrow.  Discussed how she takes care of herself at home, she states that she lives with daughter and son-in-law.  She states that she eats 1 meal a day and a snack before bedtime.  She admitted to using salt.  Discussed removing the saltshaker from the table.  Also discussed low-sodium foods and foods that are higher in sodium content.  She states that she rarely has soup that comes from a can.  Discussed sodium limit of 2000 mg a day.  Also discussed fluid restriction of 64 ounces which includes milk, juice, sodas, tea, and coffee.  And ice cream and Jell-O included in that because it melts at room temperature   She does not weigh herself daily, discussed the importance of daily weight and what to report.  She was given the living with heart failure teaching booklet along with his own magnet and information on low-sodium.  Also discussed follow-up in the outpatient heart failure clinic she was given an appointment for November 15 at  12:00.  She has a 0% history of no-shows.  Instructed I would gladly come back and talk with her daughter or come back if she had other questions.  Pricilla Riffle RN [facility]

## 2021-11-03 NOTE — TOC Initial Note (Signed)
Transition of Care [facility]) - Initial/Assessment Note    Patient Details  Name: Heidi Whitaker MRN: [id number] Date of Birth: 09-01-1929  [facility]) CM/SW Contact:    Eileen Stanford, LCSW Phone Number: 11/03/2021, 12:51 PM  Clinical Narrative:   CSW spoke with pt's daughter. [name] states pt lives with her. Pt's daughter explained they have her house fully equipped for pt to safety get around. Pt's daughter states pt has had HH in the past but didn't want to continue after states. Pt's daughter wants CSW to set it up for pt to work with them after this admission. Per daughter there is no agency preference. CSW has reached out to [facility]. Per pt's daughter they have all the DME at home.               Expected Discharge Plan: [place] Barriers to Discharge: Continued Medical Work up   Patient Goals and CMS Choice Patient states their goals for this hospitalization and ongoing recovery are:: for pt to go home   Choice offered to / list presented to : Adult Children  Expected Discharge Plan and Services Expected Discharge Plan: [place] In-house Referral: NA   Post Acute Care Choice: [place] arrangements for the past 2 months: Single Family Home                           HH Arranged: RN, PT, OT          Prior Living Arrangements/Services Living arrangements for the past 2 months: Single Family Home Lives with:: Adult Children Patient language and need for interpreter reviewed:: Yes Do you feel safe going back to the place where you live?: Yes      Need for Family Participation in Patient Care: Yes (Comment) Care giver support system in place?: Yes (comment)   Criminal Activity/Legal Involvement Pertinent to Current Situation/Hospitalization: No - Comment as needed  Activities of Daily Living Home Assistive Devices/Equipment: Eyeglasses, Hearing aid ADL Screening (condition at time of  admission) Patient's cognitive ability adequate to safely complete daily activities?: Yes Is the patient deaf or have difficulty hearing?: Yes Does the patient have difficulty seeing, even when wearing glasses/contacts?: Yes Does the patient have difficulty concentrating, remembering, or making decisions?: No Patient able to express need for assistance with ADLs?: Yes Does the patient have difficulty dressing or bathing?: Yes Independently performs ADLs?: No Communication: Independent with device (comment) Dressing (OT): Needs assistance Grooming: Needs assistance Feeding: Independent Bathing: Needs assistance Toileting: Needs assistance In/Out Bed: Needs assistance Walks in Home: Needs assistance Does the patient have difficulty walking or climbing stairs?: Yes Weakness of Legs: Both Weakness of Arms/Hands: Both  Permission Sought/Granted   Permission granted to share information with : Yes, Release of Information Signed  Share Information with NAME: [name]  Permission granted to share info w AGENCY: [place] granted to share info w Relationship: daughter     Emotional Assessment Appearance:: Appears stated age Attitude/Demeanor/Rapport: Unable to Assess Affect (typically observed): Unable to Assess Orientation: : Oriented to Self, Oriented to Place Alcohol / Substance Use: Not Applicable Psych Involvement: No (comment)  Admission diagnosis:  Acute on chronic systolic congestive heart failure (HCC) [I50.23] Atrial fibrillation with RVR (HCC) [I48.91] Acute on chronic systolic CHF (congestive heart failure) (HCC) [I50.23] Patient Active Problem List   Diagnosis Date Noted   Acute on chronic systolic  CHF (congestive heart failure) ([place]) 10/31/2021   Type II diabetes mellitus with renal manifestations ([place]) 10/31/2021   Stroke ([place]) 10/31/2021   Acute respiratory failure with hypoxia ([place]) 10/31/2021   PAD (peripheral artery disease) ([place]) 05/21/2021    Valvular heart disease 54/65/6812   Acute systolic congestive heart failure ([place]) 05/21/2021   Cerebral infarction involving left cerebellar artery ([place]) 05/21/2021   Dyspnea 05/15/2021   Generalized weakness 05/14/2021   Stage 3a chronic kidney disease ([place]) 11/29/2020   Hypertension associated with diabetes ([place]) 11/29/2020   Aortic atherosclerosis ([place]) 11/29/2020   Tubulovillous adenoma of rectum 03/05/2020   Vitamin D deficiency 06/09/2019   Atrial fibrillation with RVR ([place]) 06/09/2019   Leukocytosis 06/09/2019   Acute deep vein thrombosis (DVT) of distal vein of left lower extremity ([place]) 06/09/2019   Leg edema 06/09/2019   Diabetes mellitus type 2 in obese ([place])    Rectal mass 05/17/2019   Insomnia 09/30/2018   Abnormal gait 09/30/2018   Bilateral impacted cerumen 09/30/2018   Chronic diarrhea 09/30/2018   Type 2 diabetes mellitus with stage 3 chronic kidney disease, without long-term current use of insulin ([place]) 09/30/2018   Hyperlipidemia 09/30/2018   Nosebleed 09/30/2018   Does use hearing aid 09/30/2018   Positive colorectal cancer screening using Cologuard test 08/12/2018   HTN (hypertension) 08/12/2018   PCP:  McLean-Scocuzza, Nino Glow, MD Pharmacy:   [id number] [place], [place] [address] AT [facility] [facility] [address] [place] [address] Phone: [phone] Fax: [phone]     Social Determinants of Health (SDOH) Interventions    Readmission Risk Interventions No flowsheet data found.

## 2021-11-04 ENCOUNTER — Telehealth: Payer: Self-pay | Admitting: Internal Medicine

## 2021-11-04 DIAGNOSIS — I5023 Acute on chronic systolic (congestive) heart failure: Secondary | ICD-10-CM | POA: Diagnosis not present

## 2021-11-04 LAB — GLUCOSE, CAPILLARY
Glucose-Capillary: 134 mg/dL — ABNORMAL HIGH (ref 70–99)
Glucose-Capillary: 180 mg/dL — ABNORMAL HIGH (ref 70–99)

## 2021-11-04 MED ORDER — DILTIAZEM HCL ER COATED BEADS 120 MG PO CP24: 120.0000 mg | ORAL_CAPSULE | Freq: Every day | ORAL | 0 refills | Status: DC

## 2021-11-04 NOTE — Discharge Summary (Signed)
Physician Discharge Summary  Heidi Whitaker ZDG:[id number] DOB: November 04, 1929 DOA: 10/31/2021  PCP: McLean-Scocuzza, Nino Glow, MD  Admit date: 10/31/2021 Discharge date: 11/04/2021  Admitted From: Home Disposition: Home with home health  Recommendations for Outpatient Follow-up:  Follow up with PCP in 1-2 weeks Follow-up with cardiology as directed  Home Health: Yes PT, RN Equipment/Devices: No  Discharge Condition: Stable CODE STATUS: DNR Diet recommendation: Low-sodium  Brief/Interim Summary: 87 y.o. female with medical history significant for CHF with EF of 45 to 50%, hypertension, hyperlipidemia, type 2 diabetes mellitus, stroke, DVT, not on anticoagulation, hard of hearing, chronic kidney disease stage III AAA, peripheral vascular disease, history of nosebleed, chronic diarrhea, atrial fibrillation not on anticoagulation, status post colostomy due to 2 benign tumors per her daughter.  She presented to the emergency room for evaluation of shortness of breath and weakness.  Patient was seen by her cardiologist Dr. Towanda Malkin in clinic on the day of presentation and was found to have A. fib with rapid ventricular rate of 160 she was therefore sent to the emergency department for evaluation.  On arrival to the ED she was noted to be desaturating in the 88% on room air which improved to 92 to 95% with 2 L/min.  Weaned to room air on the day of discharge.  Volume status optimized.  Stable for discharge home.  Had a lengthy discussion with patient's daughter who states the patient is frequently noncompliant with her scheduled diuretics and difficult to control her salt intake.  I explained that these interventions were likely to keep patient in the hospital but given her advanced age I think it is reasonable to continue with as needed Lasix and monitor weight on a daily basis.  If patient notes that her weight is increasing she can take a as needed Lasix.  She will need close follow-up with a  cardiologist.  Atrial fibrillation was under control at time of discharge.  Home lisinopril has been held and diltiazem has been added to medication regimen.    Discharge Diagnoses:  Principal Problem:   Acute on chronic systolic CHF (congestive heart failure) (HCC) Active Problems:   HTN (hypertension)   Hyperlipidemia   Atrial fibrillation with RVR (HCC)   Leukocytosis   Stage 3a chronic kidney disease (HCC)   Type II diabetes mellitus with renal manifestations (HCC)   Stroke (HCC)   Acute respiratory failure with hypoxia (HCC)  Acute on chronic systolic congestive heart failure Acute hypoxic respiratory failure secondary to above Previous echocardiogram May 2022 EF 45 to 50%.   Repeat echocardiogram EF worsened at 35-40% Started on IV diuretic, has been responding appropriately Plan: Discharge home.  Can resume home Lasix 20 mg daily as needed.  Patient is reluctant to take Lasix at home and difficult to control her salt intake.  I explained to daughter that these interventions would likely keep her out of the hospital.  Given her advanced age I think it is reasonable to continue with as needed Lasix and monitor daily weights at home.  If weight is noted to be increasing can take Lasix at that time.  Follow-up outpatient cardiology for further evaluation and management.  Patient does have reduced ejection fraction and would benefit from ACE inhibitor however to provide room for [place] to give diltiazem to control her atrial fibrillation lisinopril has been held.  Patient should follow-up with primary care or cardiologist within 1 to 2 weeks of discharge to discuss reinstitution of this medication   Essential hypertension Blood  pressure controlled Plan: Continue long-acting Cardizem and PTA metoprolol on discharge.  Home lisinopril has been held.  Follow-up outpatient cardiology   Atrial fibrillation rapid ventricular response Not on anticoagulation Cardizem drip discontinued Heart  rate controlled Plan: Cardizem CD 120 mg daily PTA metoprolol Heart rate controlled at time of discharge   Leukocytosis Suspect reactive No clear source of infection Monitor vitals and fever curve No indication for antibiotics   Stage IIIa chronic kidney disease Creatinine stable    Type 2 diabetes mellitus with renal manifestations Blood sugar controlled Resume home regimen   History of CVA PTA aspirin and Plavix PTA pravastatin    Discharge Instructions  Discharge Instructions     Diet - low sodium heart healthy   Complete by: As directed    Increase activity slowly   Complete by: As directed       Allergies as of 11/04/2021       Reactions   Metformin And Related    Diarrhea        Medication List     STOP taking these medications    lisinopril 2.5 MG tablet Commonly known as: ZESTRIL       TAKE these medications    aspirin 81 MG chewable tablet Chew 81 mg by mouth daily.   clopidogrel 75 MG tablet Commonly known as: PLAVIX Take 1 tablet (75 mg total) by mouth daily.   diltiazem 120 MG 24 hr capsule Commonly known as: CARDIZEM CD Take 1 capsule (120 mg total) by mouth daily. Start taking on: November 05, 2021   furosemide 20 MG tablet Commonly known as: LASIX Take 1 tablet (20 mg total) by mouth daily as needed.   glimepiride 2 MG tablet Commonly known as: AMARYL Take 1 tablet (2 mg total) by mouth daily with breakfast.   lovastatin 20 MG tablet Commonly known as: MEVACOR Take 1 tablet (20 mg total) by mouth daily at 6 PM.   metoprolol succinate 25 MG 24 hr tablet Commonly known as: TOPROL-XL Take 1 tablet (25 mg total) by mouth daily.   mupirocin ointment 2 % Commonly known as: BACTROBAN Apply 1 application topically 2 (two) times daily.        Follow-up Information     McLean-Scocuzza, Nino Glow, MD. Schedule an appointment as soon as possible for a visit in 1 week(s).   Specialty: Internal Medicine Contact  information: [address] [place] [address] [phone]         Yolonda Kida, MD. Schedule an appointment as soon as possible for a visit in 1 week(s).   Specialties: Cardiology, Internal Medicine Contact information: [place] [address] [phone]                Allergies  Allergen Reactions   Metformin And Related     Diarrhea     Consultations: None   Procedures/Studies: DG Chest Portable 1 View  Result Date: 10/31/2021 CLINICAL DATA:  Shortness of breath. Elevated heart rate. Evaluate for infiltrate or pulmonary congestion. EXAM: PORTABLE CHEST 1 VIEW COMPARISON:  05/14/2021 FINDINGS: Patient rotated right. Right shoulder arthroplasty. Midline trachea. Mild cardiomegaly. Atherosclerosis in the transverse aorta. Tiny bilateral pleural effusions. No pneumothorax. Pulmonary interstitial prominence and indistinctness, increased. Right greater than left mild subsegmental atelectasis at the bases. IMPRESSION: Cardiomegaly and mild pulmonary venous congestion with tiny bilateral pleural effusions and dependent atelectasis. Aortic Atherosclerosis (ICD10-I70.0). Electronically Signed   By: Abigail Miyamoto M.D.   On: 10/31/2021 11:34  ECHOCARDIOGRAM COMPLETE  Result Date: 11/02/2021    ECHOCARDIOGRAM REPORT   Patient Name:   Heidi Whitaker Date of Exam: 11/02/2021 Medical Rec #:  [id number]           Height:       67.0 in Accession #:    [id number]          Weight:       179.1 lb Date of Birth:  1929-12-21           BSA:          1.930 m Patient Age:    36 years            BP:           119/72 mmHg Patient Gender: F                   HR:           133 bpm. Exam Location:  [facility] Procedure: 2D Echo Indications:     CHF I50.21  History:         Patient has prior history of Echocardiogram examinations, most                  recent 05/15/2021.  Sonographer:     Kathlen Brunswick RDCS Referring Phys:  [id number] Soledad Gerlach NIU Diagnosing Phys: Kate Sable MD  Sonographer Comments: Technically difficult study due to poor echo windows. Image acquisition challenging due to uncooperative patient. IMPRESSIONS  1. Left ventricular ejection fraction, by estimation, is 35 to 40%. The left ventricle has moderately decreased function. Left ventricular endocardial border not optimally defined to evaluate regional wall motion. There is mild left ventricular hypertrophy. Left ventricular diastolic parameters are indeterminate.  2. Right ventricular systolic function is mildly reduced. The right ventricular size is normal.  3. Left atrial size was severely dilated.  4. The mitral valve is degenerative. No evidence of mitral valve regurgitation. Moderate mitral annular calcification.  5. The aortic valve is calcified. Aortic valve regurgitation is not visualized. Mild to moderate aortic valve sclerosis/calcification is present, without any evidence of aortic stenosis. FINDINGS  Left Ventricle: Left ventricular ejection fraction, by estimation, is 35 to 40%. The left ventricle has moderately decreased function. Left ventricular endocardial border not optimally defined to evaluate regional wall motion. The left ventricular internal cavity size was normal in size. There is mild left ventricular hypertrophy. Left ventricular diastolic parameters are indeterminate. Right Ventricle: The right ventricular size is normal. No increase in right ventricular wall thickness. Right ventricular systolic function is mildly reduced. Left Atrium: Left atrial size was severely dilated. Right Atrium: Right atrial size was not well visualized. Pericardium: There is no evidence of pericardial effusion. Mitral Valve: The mitral valve is degenerative in appearance. There is mild thickening of the mitral valve leaflet(s). Moderate mitral annular calcification. No evidence of mitral valve regurgitation. Tricuspid Valve: The tricuspid valve is not well visualized. Tricuspid valve regurgitation is  not demonstrated. Aortic Valve: The aortic valve is calcified. Aortic valve regurgitation is not visualized. Mild to moderate aortic valve sclerosis/calcification is present, without any evidence of aortic stenosis. Pulmonic Valve: The pulmonic valve was not well visualized. Pulmonic valve regurgitation is not visualized. Aorta: The aortic root is normal in size and structure. IAS/Shunts: The interatrial septum was not well visualized.  LEFT VENTRICLE PLAX 2D LVIDd:         3.15 cm LVIDs:         2.55 cm LV PW:  1.28 cm LV IVS:        1.09 cm LVOT diam:     1.70 cm LVOT Area:     2.27 cm  LEFT ATRIUM         Index LA diam:    3.90 cm 2.02 cm/m   AORTA Ao Root diam: 3.10 cm Ao Asc diam:  3.00 cm  SHUNTS Systemic Diam: 1.70 cm Kate Sable MD Electronically signed by Kate Sable MD Signature Date/Time: 11/02/2021/1:44:16 PM    Final       Subjective: Seen and examined the day of discharge.  Stable no distress.  Stable for discharge home.  Discharge Exam: Vitals:   11/04/21 0923 11/04/21 1135  BP: 103/79 (!) 114/59  Pulse: 79 (!) 110  Resp: 16 16  Temp: 98.1 F (36.7 C) 98.1 F (36.7 C)  SpO2: 93% 93%   Vitals:   11/04/21 0505 11/04/21 0716 11/04/21 0923 11/04/21 1135  BP: (!) 115/91 120/81 103/79 (!) 114/59  Pulse: (!) 41 (!) 52 79 (!) 110  Resp: 18 20 16 16   Temp: 98.4 F (36.9 C) 97.7 F (36.5 C) 98.1 F (36.7 C) 98.1 F (36.7 C)  TempSrc:      SpO2: (!) 89% 93% 93% 93%  Weight: 78.5 kg     Height:        General: Pt is alert, awake, not in acute distress Cardiovascular: RRR, S1/S2 +, no rubs, no gallops Respiratory: CTA bilaterally, no wheezing, no rhonchi Abdominal: Soft, NT, ND, bowel sounds + Extremities: no edema, no cyanosis    The results of significant diagnostics from this hospitalization (including imaging, microbiology, ancillary and laboratory) are listed below for reference.     Microbiology: Recent Results (from the past 240 hour(s))   Resp Panel by RT-PCR (Flu A&B, Covid) Nasopharyngeal Swab     Status: None   Collection Time: 10/31/21 11:12 AM   Specimen: Nasopharyngeal Swab; Nasopharyngeal(NP) swabs in vial transport medium  Result Value Ref Range Status   SARS Coronavirus 2 by RT PCR NEGATIVE NEGATIVE Final    Comment: (NOTE) SARS-CoV-2 target nucleic acids are NOT DETECTED.  The SARS-CoV-2 RNA is generally detectable in upper respiratory specimens during the acute phase of infection. The lowest concentration of SARS-CoV-2 viral copies this assay can detect is 138 copies/mL. A negative result does not preclude SARS-Cov-2 infection and should not be used as the sole basis for treatment or other patient management decisions. A negative result may occur with  improper specimen collection/handling, submission of specimen other than nasopharyngeal swab, presence of viral mutation(s) within the areas targeted by this assay, and inadequate number of viral copies(<138 copies/mL). A negative result must be combined with clinical observations, patient history, and epidemiological information. The expected result is Negative.  Fact Sheet for Patients:  EntrepreneurPulse.com.au  Fact Sheet for Healthcare Providers:  IncredibleEmployment.be  This test is no t yet approved or cleared by the [place] FDA and  has been authorized for detection and/or diagnosis of SARS-CoV-2 by FDA under an Emergency Use Authorization (EUA). This EUA will remain  in effect (meaning this test can be used) for the duration of the COVID-19 declaration under Section 564(b)(1) of the Act, 21 U.S.C.section 360bbb-3(b)(1), unless the authorization is terminated  or revoked sooner.       Influenza A by PCR NEGATIVE NEGATIVE Final   Influenza B by PCR NEGATIVE NEGATIVE Final    Comment: (NOTE) The Xpert Xpress SARS-CoV-2/FLU/RSV plus assay is intended as an aid in  the diagnosis of influenza from  Nasopharyngeal swab specimens and should not be used as a sole basis for treatment. Nasal washings and aspirates are unacceptable for Xpert Xpress SARS-CoV-2/FLU/RSV testing.  Fact Sheet for Patients: EntrepreneurPulse.com.au  Fact Sheet for Healthcare Providers: IncredibleEmployment.be  This test is not yet approved or cleared by the [place] FDA and has been authorized for detection and/or diagnosis of SARS-CoV-2 by FDA under an Emergency Use Authorization (EUA). This EUA will remain in effect (meaning this test can be used) for the duration of the COVID-19 declaration under Section 564(b)(1) of the Act, 21 U.S.C. section 360bbb-3(b)(1), unless the authorization is terminated or revoked.  Performed at [facility], [address] [place], [place] [address]      Labs: BNP (last 3 results) Recent Labs    05/14/21 1531 10/31/21 1112  BNP 191.8* 878.6*   Basic Metabolic Panel: Recent Labs  Lab 10/31/21 1114 10/31/21 1115 11/03/21 0417  NA 140 139 145  K 3.3* 4.5 3.6  CL 100 104 101  CO2 31 28 28   GLUCOSE 117* 151* 128*  BUN 13 18 25*  CREATININE 1.02* 1.13* 1.04*  CALCIUM 9.2 9.8 9.6  MG 2.1 2.5*  --    Liver Function Tests: Recent Labs  Lab 10/31/21 1115  AST 18  ALT 10  ALKPHOS 55  BILITOT 1.4*  PROT 7.5  ALBUMIN 4.1   No results for input(s): LIPASE, AMYLASE in the last 168 hours. No results for input(s): AMMONIA in the last 168 hours. CBC: Recent Labs  Lab 10/31/21 1114 10/31/21 1115 11/03/21 0417  WBC 12.8* 11.6* 13.8*  NEUTROABS  --  6.8 7.8*  HGB 13.8 13.8 13.2  HCT 44.2 42.9 40.7  MCV 92.5 90.7 90.6  PLT 170 332 306   Cardiac Enzymes: No results for input(s): CKTOTAL, CKMB, CKMBINDEX, TROPONINI in the last 168 hours. BNP: Invalid input(s): POCBNP CBG: Recent Labs  Lab 11/03/21 1152 11/03/21 1544 11/03/21 2110 11/04/21 0718 11/04/21 1137  GLUCAP 184* 169* 134* 134* 180*    D-Dimer No results for input(s): DDIMER in the last 72 hours. Hgb A1c No results for input(s): HGBA1C in the last 72 hours. Lipid Profile No results for input(s): CHOL, HDL, LDLCALC, TRIG, CHOLHDL, LDLDIRECT in the last 72 hours. Thyroid function studies No results for input(s): TSH, T4TOTAL, T3FREE, THYROIDAB in the last 72 hours.  Invalid input(s): FREET3 Anemia work up No results for input(s): VITAMINB12, FOLATE, FERRITIN, TIBC, IRON, RETICCTPCT in the last 72 hours. Urinalysis    Component Value Date/Time   COLORURINE STRAW (A) 05/14/2021 1806   APPEARANCEUR CLEAR (A) 05/14/2021 1806   LABSPEC 1.004 (L) 05/14/2021 1806   PHURINE 7.0 05/14/2021 1806   GLUCOSEU NEGATIVE 05/14/2021 1806   HGBUR NEGATIVE 05/14/2021 1806   BILIRUBINUR NEGATIVE 05/14/2021 1806   KETONESUR NEGATIVE 05/14/2021 1806   PROTEINUR NEGATIVE 05/14/2021 1806   NITRITE NEGATIVE 05/14/2021 1806   LEUKOCYTESUR SMALL (A) 05/14/2021 1806   Sepsis Labs Invalid input(s): PROCALCITONIN,  WBC,  LACTICIDVEN Microbiology Recent Results (from the past 240 hour(s))  Resp Panel by RT-PCR (Flu A&B, Covid) Nasopharyngeal Swab     Status: None   Collection Time: 10/31/21 11:12 AM   Specimen: Nasopharyngeal Swab; Nasopharyngeal(NP) swabs in vial transport medium  Result Value Ref Range Status   SARS Coronavirus 2 by RT PCR NEGATIVE NEGATIVE Final    Comment: (NOTE) SARS-CoV-2 target nucleic acids are NOT DETECTED.  The SARS-CoV-2 RNA is generally detectable in upper respiratory specimens during  the acute phase of infection. The lowest concentration of SARS-CoV-2 viral copies this assay can detect is 138 copies/mL. A negative result does not preclude SARS-Cov-2 infection and should not be used as the sole basis for treatment or other patient management decisions. A negative result may occur with  improper specimen collection/handling, submission of specimen other than nasopharyngeal swab, presence of viral  mutation(s) within the areas targeted by this assay, and inadequate number of viral copies(<138 copies/mL). A negative result must be combined with clinical observations, patient history, and epidemiological information. The expected result is Negative.  Fact Sheet for Patients:  EntrepreneurPulse.com.au  Fact Sheet for Healthcare Providers:  IncredibleEmployment.be  This test is no t yet approved or cleared by the [place] FDA and  has been authorized for detection and/or diagnosis of SARS-CoV-2 by FDA under an Emergency Use Authorization (EUA). This EUA will remain  in effect (meaning this test can be used) for the duration of the COVID-19 declaration under Section 564(b)(1) of the Act, 21 U.S.C.section 360bbb-3(b)(1), unless the authorization is terminated  or revoked sooner.       Influenza A by PCR NEGATIVE NEGATIVE Final   Influenza B by PCR NEGATIVE NEGATIVE Final    Comment: (NOTE) The Xpert Xpress SARS-CoV-2/FLU/RSV plus assay is intended as an aid in the diagnosis of influenza from Nasopharyngeal swab specimens and should not be used as a sole basis for treatment. Nasal washings and aspirates are unacceptable for Xpert Xpress SARS-CoV-2/FLU/RSV testing.  Fact Sheet for Patients: EntrepreneurPulse.com.au  Fact Sheet for Healthcare Providers: IncredibleEmployment.be  This test is not yet approved or cleared by the [place] FDA and has been authorized for detection and/or diagnosis of SARS-CoV-2 by FDA under an Emergency Use Authorization (EUA). This EUA will remain in effect (meaning this test can be used) for the duration of the COVID-19 declaration under Section 564(b)(1) of the Act, 21 U.S.C. section 360bbb-3(b)(1), unless the authorization is terminated or revoked.  Performed at [facility], [address] [place], [place] [address]      Time coordinating  discharge: Over 30 minutes  SIGNED:   Sidney Ace, MD  [facility] 11/04/2021, 12:22 PM Pager   If 7PM-7AM, please contact night-coverage

## 2021-11-04 NOTE — Telephone Encounter (Signed)
Pts daughter called stating pt was admitted into the hospital ED on 11/4-11/8. Pt has congestive heart failure. Pt has an appt scheduled for Nov 29 at 9:30. Pt was told to follow up with doctor and cardiologist within one week but keeping appt already set for 11/29 in case she is unable to be seen sooner. Pts daughter is also requesting a phone call so she can discuss pts new POC.   Pts daughter [phone]

## 2021-11-04 NOTE — TOC Transition Note (Signed)
Transition of Care [facility]) - CM/SW Discharge Note   Patient Details  Name: Heidi Whitaker MRN: [id number] Date of Birth: [birth year]  Transition of Care [facility]) CM/SW Contact:  Eileen Stanford, LCSW Phone Number: 11/04/2021, 11:35 AM   Clinical Narrative:   [place] arranged through [facility]. No dme needs. No additional needs.    Final next level of care: [place] Barriers to Discharge: No Barriers Identified   Patient Goals and CMS Choice Patient states their goals for this hospitalization and ongoing recovery are:: for pt to go home   Choice offered to / list presented to : Adult Children  Discharge Placement                    Patient and family notified of of transfer: 11/04/21  Discharge Plan and Services In-house Referral: NA   Post Acute Care Choice: Home Health                    HH Arranged: PT, RN [facility]: [place] Rhonda W Agency: [place] Date [place]: 11/04/21 Time [place]: 8138    Social Determinants of Health (SDOH) Interventions     Readmission Risk Interventions No flowsheet data found.

## 2021-11-05 NOTE — Telephone Encounter (Signed)
Please advise, okay for Patient to wait to be seen until 11/25/21 or does this need to moved up sooner?

## 2021-11-05 NOTE — Telephone Encounter (Signed)
Call daughter she needs cardiology appt 1st over PCP try to schedule this in 1 week  Call and schedule shes established right?

## 2021-11-05 NOTE — Telephone Encounter (Signed)
Patients daughter informed and verbalized understanding

## 2021-11-05 NOTE — Telephone Encounter (Signed)
Patient is scheduled to be seen by Cardiology

## 2021-11-05 NOTE — Telephone Encounter (Signed)
TCM will not be placed at this time. Due to Cardiology to receive transition of care follow up.

## 2021-11-06 ENCOUNTER — Telehealth: Payer: Self-pay | Admitting: Internal Medicine

## 2021-11-06 DIAGNOSIS — I152 Hypertension secondary to endocrine disorders: Secondary | ICD-10-CM | POA: Diagnosis not present

## 2021-11-06 DIAGNOSIS — I5023 Acute on chronic systolic (congestive) heart failure: Secondary | ICD-10-CM | POA: Diagnosis not present

## 2021-11-06 DIAGNOSIS — I4891 Unspecified atrial fibrillation: Secondary | ICD-10-CM | POA: Diagnosis not present

## 2021-11-06 DIAGNOSIS — E559 Vitamin D deficiency, unspecified: Secondary | ICD-10-CM | POA: Diagnosis not present

## 2021-11-06 DIAGNOSIS — I700 Atherosclerosis of aorta: Secondary | ICD-10-CM | POA: Diagnosis not present

## 2021-11-06 DIAGNOSIS — J9601 Acute respiratory failure with hypoxia: Secondary | ICD-10-CM | POA: Diagnosis not present

## 2021-11-06 DIAGNOSIS — N1831 Chronic kidney disease, stage 3a: Secondary | ICD-10-CM | POA: Diagnosis not present

## 2021-11-06 DIAGNOSIS — E1159 Type 2 diabetes mellitus with other circulatory complications: Secondary | ICD-10-CM | POA: Diagnosis not present

## 2021-11-06 DIAGNOSIS — E1122 Type 2 diabetes mellitus with diabetic chronic kidney disease: Secondary | ICD-10-CM | POA: Diagnosis not present

## 2021-11-06 DIAGNOSIS — E1151 Type 2 diabetes mellitus with diabetic peripheral angiopathy without gangrene: Secondary | ICD-10-CM | POA: Diagnosis not present

## 2021-11-06 NOTE — Telephone Encounter (Signed)
Heidi Whitaker from [facility] would like to verify home health orders. [name] is requesting for approval for nursing order on [facility] and medication management. [name] is requesting PT evaluation. Heidi requesting callback.

## 2021-11-07 ENCOUNTER — Telehealth: Payer: Self-pay

## 2021-11-07 NOTE — Telephone Encounter (Signed)
yes

## 2021-11-07 NOTE — Telephone Encounter (Signed)
  Post hospital phone call  Called and spoke with daughter.  She states that her mother is very weak, and is sleeping a lot.  Yesterday the home health nurse came for the first visit.  The daughter states that she is not cooking with salt and they have taken it off the table to liberalize the diet just a little bit more.  She is weighing herself and weight has remained steady within a pound.  The home health nurse they are discussing palliative/hospice care.  She has an appointment to see her cardiologist on Monday the 14th and will see Darylene Price on November 21 at 1030.  She had no further questions.  Pricilla Riffle RN [facility]

## 2021-11-07 NOTE — Telephone Encounter (Signed)
Okay for verbal for home health orders?

## 2021-11-07 NOTE — Telephone Encounter (Signed)
Verbal given 

## 2021-11-10 DIAGNOSIS — E785 Hyperlipidemia, unspecified: Secondary | ICD-10-CM | POA: Diagnosis not present

## 2021-11-10 DIAGNOSIS — R0602 Shortness of breath: Secondary | ICD-10-CM | POA: Diagnosis not present

## 2021-11-10 DIAGNOSIS — I509 Heart failure, unspecified: Secondary | ICD-10-CM | POA: Diagnosis not present

## 2021-11-10 DIAGNOSIS — R600 Localized edema: Secondary | ICD-10-CM | POA: Diagnosis not present

## 2021-11-10 DIAGNOSIS — I208 Other forms of angina pectoris: Secondary | ICD-10-CM | POA: Diagnosis not present

## 2021-11-10 DIAGNOSIS — N1832 Chronic kidney disease, stage 3b: Secondary | ICD-10-CM | POA: Diagnosis not present

## 2021-11-10 DIAGNOSIS — E1122 Type 2 diabetes mellitus with diabetic chronic kidney disease: Secondary | ICD-10-CM | POA: Diagnosis not present

## 2021-11-10 DIAGNOSIS — I4891 Unspecified atrial fibrillation: Secondary | ICD-10-CM | POA: Diagnosis not present

## 2021-11-10 DIAGNOSIS — E119 Type 2 diabetes mellitus without complications: Secondary | ICD-10-CM | POA: Diagnosis not present

## 2021-11-10 DIAGNOSIS — I10 Essential (primary) hypertension: Secondary | ICD-10-CM | POA: Diagnosis not present

## 2021-11-11 ENCOUNTER — Other Ambulatory Visit: Payer: Self-pay | Admitting: Internal Medicine

## 2021-11-11 ENCOUNTER — Ambulatory Visit: Payer: Medicare HMO | Admitting: Family

## 2021-11-11 DIAGNOSIS — I4891 Unspecified atrial fibrillation: Secondary | ICD-10-CM | POA: Diagnosis not present

## 2021-11-11 DIAGNOSIS — E1122 Type 2 diabetes mellitus with diabetic chronic kidney disease: Secondary | ICD-10-CM | POA: Diagnosis not present

## 2021-11-11 DIAGNOSIS — E1151 Type 2 diabetes mellitus with diabetic peripheral angiopathy without gangrene: Secondary | ICD-10-CM | POA: Diagnosis not present

## 2021-11-11 DIAGNOSIS — N1831 Chronic kidney disease, stage 3a: Secondary | ICD-10-CM | POA: Diagnosis not present

## 2021-11-11 DIAGNOSIS — I152 Hypertension secondary to endocrine disorders: Secondary | ICD-10-CM | POA: Diagnosis not present

## 2021-11-11 DIAGNOSIS — E559 Vitamin D deficiency, unspecified: Secondary | ICD-10-CM | POA: Diagnosis not present

## 2021-11-11 DIAGNOSIS — E1159 Type 2 diabetes mellitus with other circulatory complications: Secondary | ICD-10-CM | POA: Diagnosis not present

## 2021-11-11 DIAGNOSIS — I5023 Acute on chronic systolic (congestive) heart failure: Secondary | ICD-10-CM | POA: Diagnosis not present

## 2021-11-11 DIAGNOSIS — I700 Atherosclerosis of aorta: Secondary | ICD-10-CM | POA: Diagnosis not present

## 2021-11-11 DIAGNOSIS — J9601 Acute respiratory failure with hypoxia: Secondary | ICD-10-CM | POA: Diagnosis not present

## 2021-11-12 ENCOUNTER — Telehealth: Payer: Self-pay | Admitting: Internal Medicine

## 2021-11-12 NOTE — Telephone Encounter (Signed)
Rx in box rollator with seat and brakes

## 2021-11-12 NOTE — Telephone Encounter (Signed)
L[place], PT from [place], [phone]. At this time patient does not need physical therapy. Patient does need a prescription for a  walker with a seat, if insurance calls for it.

## 2021-11-12 NOTE — Telephone Encounter (Signed)
For your information  

## 2021-11-16 NOTE — Progress Notes (Deleted)
   Patient ID: Heidi Whitaker, female    DOB: 1929-08-19, 87 y.o.   MRN: [id number]  HPI  Heidi Whitaker is a 87 y/o female with a history of atrial fibrillation, DM, HTN, DVT, PAD, previous tobacco use and chronic heart failure.   Echo report from 11/02/21 reviewed and showed an EF of 35-40% along with mild LVH and severe LAE.   Admitted 11/10/21 due to shortness of breath/ weakness. Noted AF RVR. Initially needed oxygen but then weaned off to room air. Wound consult obtained. Low sodium diet and diuretic usage emphasized. Discharged after 4 days.   She presents today for her initial visit with a chief complaint of   Review of Systems    Physical Exam  Assessment & Plan:  1: Chronic heart failure with reduced ejection fraction- - NYHA class   - BNP 10/31/21 was 355.8  2: HTN- - BP - saw PCP (McLean-Scocuzza) 05/21/21 - BMP 11/03/21 reviewed and showed sodium 145, potassium 3.6, creatinine 1.04 and GFR 51  3: DM- - A1c 05/14/21 was 7.0%  4: Atrial fibrillation- - saw cardiology [name]) 11/10/21  5: PAD- - saw vascular Owens Shark) 09/24/21

## 2021-11-17 ENCOUNTER — Other Ambulatory Visit: Payer: Self-pay

## 2021-11-17 ENCOUNTER — Ambulatory Visit: Payer: Medicare HMO | Attending: Family | Admitting: Family

## 2021-11-17 ENCOUNTER — Encounter: Payer: Self-pay | Admitting: Family

## 2021-11-17 VITALS — BP 116/71 | HR 105 | Resp 16 | Ht 64.0 in | Wt 177.5 lb

## 2021-11-17 DIAGNOSIS — I4821 Permanent atrial fibrillation: Secondary | ICD-10-CM | POA: Diagnosis not present

## 2021-11-17 DIAGNOSIS — Z8673 Personal history of transient ischemic attack (TIA), and cerebral infarction without residual deficits: Secondary | ICD-10-CM | POA: Diagnosis not present

## 2021-11-17 DIAGNOSIS — E785 Hyperlipidemia, unspecified: Secondary | ICD-10-CM | POA: Insufficient documentation

## 2021-11-17 DIAGNOSIS — E1151 Type 2 diabetes mellitus with diabetic peripheral angiopathy without gangrene: Secondary | ICD-10-CM | POA: Insufficient documentation

## 2021-11-17 DIAGNOSIS — I5022 Chronic systolic (congestive) heart failure: Secondary | ICD-10-CM | POA: Insufficient documentation

## 2021-11-17 DIAGNOSIS — N183 Chronic kidney disease, stage 3 unspecified: Secondary | ICD-10-CM | POA: Insufficient documentation

## 2021-11-17 DIAGNOSIS — I4891 Unspecified atrial fibrillation: Secondary | ICD-10-CM | POA: Insufficient documentation

## 2021-11-17 DIAGNOSIS — Z9049 Acquired absence of other specified parts of digestive tract: Secondary | ICD-10-CM | POA: Diagnosis not present

## 2021-11-17 DIAGNOSIS — Z86718 Personal history of other venous thrombosis and embolism: Secondary | ICD-10-CM | POA: Insufficient documentation

## 2021-11-17 DIAGNOSIS — I10 Essential (primary) hypertension: Secondary | ICD-10-CM

## 2021-11-17 DIAGNOSIS — I739 Peripheral vascular disease, unspecified: Secondary | ICD-10-CM | POA: Diagnosis not present

## 2021-11-17 DIAGNOSIS — Z8719 Personal history of other diseases of the digestive system: Secondary | ICD-10-CM | POA: Diagnosis not present

## 2021-11-17 DIAGNOSIS — N1831 Chronic kidney disease, stage 3a: Secondary | ICD-10-CM

## 2021-11-17 DIAGNOSIS — I130 Hypertensive heart and chronic kidney disease with heart failure and stage 1 through stage 4 chronic kidney disease, or unspecified chronic kidney disease: Secondary | ICD-10-CM | POA: Insufficient documentation

## 2021-11-17 DIAGNOSIS — Z79899 Other long term (current) drug therapy: Secondary | ICD-10-CM | POA: Insufficient documentation

## 2021-11-17 DIAGNOSIS — E1122 Type 2 diabetes mellitus with diabetic chronic kidney disease: Secondary | ICD-10-CM | POA: Diagnosis not present

## 2021-11-17 DIAGNOSIS — Z713 Dietary counseling and surveillance: Secondary | ICD-10-CM | POA: Insufficient documentation

## 2021-11-17 NOTE — Progress Notes (Signed)
Patient ID: Heidi Whitaker, female    DOB: [birth year], 87y.o.   MRN: [id number]   HPI   Ms Demauro is a 87 y/o female with a history of CHF with EF of 45 to 50%, hypertension, hyperlipidemia, type 2 diabetes mellitus, stroke, DVT (not on anticoagulation), hard of hearing, chronic kidney disease stage III AAA, peripheral vascular disease, history of nosebleed, chronic diarrhea, atrial fibrillation not on anticoagulation, status post colostomy due to 2 benign tumors per her daughter.   Echo report from 11/02/21 reviewed and showed an EF of 35-40% along with mild LVH and severe LAE.    Admitted 10/31/21- 11/04/21 after being evaluated in Dr. Etta Quill office sent to ED due to shortness of breath/weakness. Noted AF RVR. Initially needed oxygen but then weaned off to room air. Wound consult obtained. Low sodium diet and diuretic usage emphasized. Discharged after 4 days.    She presents today for her initial visit without a chief complaint. Her daughter accompanies her and provides much of the history, due to pt being HOH. Daughter reports that her mother is very stoic and does not complain regardless of how bad she may feel. Prior to 10/31/21, her daughter had noticed she was increasingly SOB with everyday tasks, and worsening fatigue. At baseline, she needs to rest with her household chores. Denies CP, palpitations, pedal edema, dizziness, or orthopnea.  Since her discharge, they have been weighing her daily and she has been around 176-178, which appears to be her baseline. She has a home health RN coming out once/week for a month due to recent discharge. During her visit on Wednesday, the RN noted her weight had increased two pounds. RN called cardiology office and lasix was changed from PRN to 40 mg PO x 5 days until she could be evaluated in the HF clinic. They report no weight loss with the change in lasix. Possible her weight was just returning to her baseline follow her discharge.   Past Medical  History:  Diagnosis Date   Arrhythmia    atrial fibrillation   CHF (congestive heart failure) (HCC)    Complication of anesthesia    Diabetes mellitus without complication ([place])    DVT (deep venous thrombosis) ([place])    06/02/2019   Hard of hearing    Hypertension    PAD (peripheral artery disease) ([place])     Past Surgical History:  Procedure Laterality Date   CESAREAN SECTION     COLONOSCOPY N/A 05/17/2019   Procedure: FLEXIBLE SIGMOIDOSCOPY;  Surgeon: Ileana Roup, MD;  Location: [facility];  Service: General;  Laterality: N/A;   COLONOSCOPY WITH PROPOFOL N/A 01/09/2019   Procedure: COLONOSCOPY WITH PROPOFOL;  Surgeon: Lin Landsman, MD;  Location: [place];  Service: Gastroenterology;  Laterality: N/A;   ESOPHAGOGASTRODUODENOSCOPY (EGD) WITH PROPOFOL N/A 01/09/2019   Procedure: ESOPHAGOGASTRODUODENOSCOPY (EGD) WITH PROPOFOL;  Surgeon: Lin Landsman, MD;  Location: [facility];  Service: Gastroenterology;  Laterality: N/A;   KNEE SURGERY     left 2017/2018   LOWER EXTREMITY ANGIOGRAPHY Left 05/29/2021   Procedure: LOWER EXTREMITY ANGIOGRAPHY;  Surgeon: Algernon Huxley, MD;  Location: [facility];  Service: Cardiovascular;  Laterality: Left;   LOWER EXTREMITY ANGIOGRAPHY Right 06/05/2021   Procedure: LOWER EXTREMITY ANGIOGRAPHY;  Surgeon: Algernon Huxley, MD;  Location: [place] [facility];  Service: Cardiovascular;  Laterality: Right;   SHOULDER SURGERY     right   XI ROBOTIC ASSISTED LOWER ANTERIOR RESECTION N/A 05/17/2019   Procedure: XI ROBOTIC  ASSISTED LOWER ANTERIOR RESECTION WITH END COLOSTOMY;  Surgeon: Ileana Roup, MD;  Location: [facility];  Service: General;  Laterality: N/A;   Family History  Problem Relation Age of Onset   Addison's disease Maternal Aunt    Heart disease Mother    Heart disease Father    Social History   Tobacco Use   Smoking status: Former    Years: 4.00    Types: Cigarettes   Smokeless tobacco: Never   Tobacco  comments:    3 per day  Substance Use Topics   Alcohol use: No   Allergies  Allergen Reactions   Metformin And Related     Diarrhea    Prior to Admission medications   Medication Sig Start Date End Date Taking? Authorizing Provider  aspirin 81 MG chewable tablet Chew 81 mg by mouth daily.    [provider]  clopidogrel (PLAVIX) 75 MG tablet Take 1 tablet (75 mg total) by mouth daily. 06/13/21   Kris Hartmann, NP  diltiazem (CARDIZEM CD) 120 MG 24 hr capsule Take 1 capsule (120 mg total) by mouth daily. 11/05/21 12/05/21  Sidney Ace, MD  furosemide (LASIX) 20 MG tablet Take 1 tablet (20 mg total) by mouth daily as needed. 06/06/21   McLean-Scocuzza, Nino Glow, MD  glimepiride (AMARYL) 2 MG tablet Take 1 tablet (2 mg total) by mouth daily with breakfast. 11/29/20   McLean-Scocuzza, Nino Glow, MD  lovastatin (MEVACOR) 20 MG tablet Take 1 tablet (20 mg total) by mouth daily at 6 PM. 11/29/20   McLean-Scocuzza, Nino Glow, MD  metoprolol succinate (TOPROL-XL) 25 MG 24 hr tablet TAKE ONE TABLET BY MOUTH ONE TIME DAILY 11/11/21   McLean-Scocuzza, Nino Glow, MD  mupirocin ointment (BACTROBAN) 2 % Apply 1 application topically 2 (two) times daily. 05/21/21   McLean-Scocuzza, Nino Glow, MD    Review of Systems  Constitutional:  Negative for malaise/fatigue.  HENT:  Positive for hearing loss. Negative for congestion.   Eyes: Negative.   Respiratory:  Positive for shortness of breath (on exertion). Negative for cough, hemoptysis and wheezing.   Cardiovascular:  Negative for chest pain, palpitations and orthopnea.  Gastrointestinal: Negative.   Genitourinary: Negative.   Musculoskeletal: Negative.   Skin: Negative.   Neurological:  Negative for dizziness, weakness and headaches.  Endo/Heme/Allergies:  Bruises/bleeds easily.  Psychiatric/Behavioral:  Negative for depression. The patient is not nervous/anxious.     Vitals:   11/17/21 1016 11/17/21 1038  BP: 116/71   Pulse: (!) 144 (!) 105   Resp: 16   SpO2: 97%   Weight: 177 lb 8 oz (80.5 kg)   Height: 5\' 4"  (1.626 m)    Wt Readings from Last 3 Encounters:  11/17/21 177 lb 8 oz (80.5 kg)  11/04/21 173 lb 1 oz (78.5 kg)  09/24/21 181 lb 9.6 oz (82.4 kg)    Lab Results  Component Value Date   CREATININE 1.04 (H) 11/03/2021   CREATININE 1.13 (H) 10/31/2021   CREATININE 1.02 (H) 10/31/2021         Physical Exam Vitals and nursing note reviewed. Exam conducted with a chaperone present.  Constitutional:      General: She is not in acute distress.    Appearance: Normal appearance. She is not ill-appearing.  HENT:     Ears:     Comments: Hearing aids present Neck:     Vascular: No carotid bruit.  Cardiovascular:     Rate and Rhythm: Tachycardia present. Rhythm irregular.  Pulmonary:     Effort: No respiratory distress.     Breath sounds: No wheezing, rhonchi or rales.  Abdominal:     Palpations: Abdomen is soft.     Comments: L colostomy  Musculoskeletal:     Right lower leg: Edema (+1) present.     Left lower leg: Edema (+1) present.  Skin:    General: Skin is warm and dry.  Neurological:     Mental Status: She is alert and oriented to person, place, and time. Mental status is at baseline.  Psychiatric:        Mood and Affect: Mood normal.      Assessment & Plan: 1: Chronic heart failure with reduced ejection fraction- - NYHA class II - BNP 10/31/21 was 355.8 - weighing daily, today 177 in clinic - advised to call for wt gain > 2 lbs/24 hours or 5 lbs/week - provided low sodium cookbook, however pt prefers salt and had been told she can continue to eat in light of her age and quality of life - on GDMT of metoprolol 50 mg QD - discussed Entresto, may consider adding at another time - was on lasix PRN, then 40mg  x 5 days, today she still has edema and will change her lasix to 20 mg QD - encouraged to get compression stockings and keep feet elevated when possible   2: HTN- - BP 116/77 - saw PCP  (McLean-Scocuzza) 05/21/21, will see for post-hospital f/u on 11/25/21 - BMP 11/03/21 reviewed and showed sodium 145, potassium 3.6, creatinine 1.04 and GFR 51   3: DM- - A1c 05/14/21 was 7.0%   4: Atrial fibrillation- - saw cardiology [name]) 11/10/21 - diltiazem started during hospitalization and stopped by cardiology and metoprolol was increased to 50 mg QD - initially HR 144 during check in, pt asymptomatic and is unaware if she is in afib or not, re-checked 105 - irregular rhythm noted    5: PAD- - saw vascular Owens Shark) 09/24/21   Medications were brought in and reviewed with patient and her daughter.  Return in 2 months.

## 2021-11-17 NOTE — Patient Instructions (Signed)
Return in 2 months to Heart Failure clinic  Continue to take (1) 20 mg furosemide/lasix per day  Get some compression socks (from pharmacy), place on in am and off at bedtime

## 2021-11-18 ENCOUNTER — Telehealth: Payer: Self-pay | Admitting: Internal Medicine

## 2021-11-18 DIAGNOSIS — I5023 Acute on chronic systolic (congestive) heart failure: Secondary | ICD-10-CM | POA: Diagnosis not present

## 2021-11-18 DIAGNOSIS — I700 Atherosclerosis of aorta: Secondary | ICD-10-CM | POA: Diagnosis not present

## 2021-11-18 DIAGNOSIS — E1151 Type 2 diabetes mellitus with diabetic peripheral angiopathy without gangrene: Secondary | ICD-10-CM | POA: Diagnosis not present

## 2021-11-18 DIAGNOSIS — E559 Vitamin D deficiency, unspecified: Secondary | ICD-10-CM | POA: Diagnosis not present

## 2021-11-18 DIAGNOSIS — I152 Hypertension secondary to endocrine disorders: Secondary | ICD-10-CM | POA: Diagnosis not present

## 2021-11-18 DIAGNOSIS — E1122 Type 2 diabetes mellitus with diabetic chronic kidney disease: Secondary | ICD-10-CM | POA: Diagnosis not present

## 2021-11-18 DIAGNOSIS — I4891 Unspecified atrial fibrillation: Secondary | ICD-10-CM | POA: Diagnosis not present

## 2021-11-18 DIAGNOSIS — E1159 Type 2 diabetes mellitus with other circulatory complications: Secondary | ICD-10-CM | POA: Diagnosis not present

## 2021-11-18 DIAGNOSIS — N1831 Chronic kidney disease, stage 3a: Secondary | ICD-10-CM | POA: Diagnosis not present

## 2021-11-18 DIAGNOSIS — J9601 Acute respiratory failure with hypoxia: Secondary | ICD-10-CM | POA: Diagnosis not present

## 2021-11-18 NOTE — Telephone Encounter (Signed)
Palliative care nurse called. This afternoon heart rate was 138, BP was 160/60. No symptoms including no chest pain no difficulty breathing. Currently taking metoprolol 50 mg daily. In the past she took up to 100 mg daily. Heart rate yesterday at cardiology was also elevated at 144. Plan: Take a extra metoprolol XL 25 mg now Notify PCP in the morning.

## 2021-11-19 ENCOUNTER — Telehealth: Payer: Self-pay | Admitting: Internal Medicine

## 2021-11-19 NOTE — Telephone Encounter (Signed)
Alisa Graff, FNP  McLean-Scocuzza, Nino Glow, MD Caller: Unspecified Wilburn Mylar,  5:36 PM) Patient was reached and said that her BP was 117/96 with a HR ranging from 58-97. We told her that if her HR goes >100 and stays up, to take an additional metoprolol and then follow-up with Dr. Etta Quill office. He has recently stopped her cardizem (due to her low EF) and adjusted her metoprolol but he may need to adjust meds further for her atrial fibrillation.   Thank you,   Darylene Price, [facility]  [phone]

## 2021-11-19 NOTE — Telephone Encounter (Signed)
Seen cardiology yesterday will have them weigh in and cardiology call nurse

## 2021-11-19 NOTE — Telephone Encounter (Signed)
Please advise, does Patient need to come in and be seen today or does she need to see her Cardiologist?

## 2021-11-22 ENCOUNTER — Other Ambulatory Visit: Payer: Self-pay | Admitting: Internal Medicine

## 2021-11-22 DIAGNOSIS — E1122 Type 2 diabetes mellitus with diabetic chronic kidney disease: Secondary | ICD-10-CM

## 2021-11-22 NOTE — Telephone Encounter (Signed)
Heidi Whitaker Nov 17, 2021 10:33 AM  Patient states Dr [name] told them to take 50 mg daily now

## 2021-11-24 DIAGNOSIS — E1122 Type 2 diabetes mellitus with diabetic chronic kidney disease: Secondary | ICD-10-CM | POA: Diagnosis not present

## 2021-11-24 DIAGNOSIS — I700 Atherosclerosis of aorta: Secondary | ICD-10-CM | POA: Diagnosis not present

## 2021-11-24 DIAGNOSIS — I5023 Acute on chronic systolic (congestive) heart failure: Secondary | ICD-10-CM | POA: Diagnosis not present

## 2021-11-24 DIAGNOSIS — E1159 Type 2 diabetes mellitus with other circulatory complications: Secondary | ICD-10-CM | POA: Diagnosis not present

## 2021-11-24 DIAGNOSIS — E1151 Type 2 diabetes mellitus with diabetic peripheral angiopathy without gangrene: Secondary | ICD-10-CM | POA: Diagnosis not present

## 2021-11-24 DIAGNOSIS — I4891 Unspecified atrial fibrillation: Secondary | ICD-10-CM | POA: Diagnosis not present

## 2021-11-24 DIAGNOSIS — I152 Hypertension secondary to endocrine disorders: Secondary | ICD-10-CM | POA: Diagnosis not present

## 2021-11-24 DIAGNOSIS — E559 Vitamin D deficiency, unspecified: Secondary | ICD-10-CM | POA: Diagnosis not present

## 2021-11-24 DIAGNOSIS — N1831 Chronic kidney disease, stage 3a: Secondary | ICD-10-CM | POA: Diagnosis not present

## 2021-11-24 DIAGNOSIS — J9601 Acute respiratory failure with hypoxia: Secondary | ICD-10-CM | POA: Diagnosis not present

## 2021-11-25 ENCOUNTER — Telehealth: Payer: Self-pay

## 2021-11-25 ENCOUNTER — Other Ambulatory Visit: Payer: Self-pay | Admitting: Family

## 2021-11-25 ENCOUNTER — Other Ambulatory Visit: Payer: Self-pay

## 2021-11-25 ENCOUNTER — Telehealth: Payer: Self-pay | Admitting: Nurse Practitioner

## 2021-11-25 ENCOUNTER — Encounter: Payer: Self-pay | Admitting: Internal Medicine

## 2021-11-25 ENCOUNTER — Ambulatory Visit (INDEPENDENT_AMBULATORY_CARE_PROVIDER_SITE_OTHER): Payer: Medicare HMO | Admitting: Internal Medicine

## 2021-11-25 ENCOUNTER — Telehealth: Payer: Self-pay | Admitting: Internal Medicine

## 2021-11-25 VITALS — BP 118/80 | HR 116 | Temp 97.4°F | Ht 64.0 in | Wt 183.4 lb

## 2021-11-25 DIAGNOSIS — R0602 Shortness of breath: Secondary | ICD-10-CM | POA: Diagnosis not present

## 2021-11-25 DIAGNOSIS — E876 Hypokalemia: Secondary | ICD-10-CM

## 2021-11-25 DIAGNOSIS — D72829 Elevated white blood cell count, unspecified: Secondary | ICD-10-CM

## 2021-11-25 DIAGNOSIS — E1122 Type 2 diabetes mellitus with diabetic chronic kidney disease: Secondary | ICD-10-CM

## 2021-11-25 DIAGNOSIS — N183 Chronic kidney disease, stage 3 unspecified: Secondary | ICD-10-CM

## 2021-11-25 DIAGNOSIS — Z9181 History of falling: Secondary | ICD-10-CM | POA: Diagnosis not present

## 2021-11-25 DIAGNOSIS — I5022 Chronic systolic (congestive) heart failure: Secondary | ICD-10-CM

## 2021-11-25 DIAGNOSIS — I152 Hypertension secondary to endocrine disorders: Secondary | ICD-10-CM

## 2021-11-25 DIAGNOSIS — R600 Localized edema: Secondary | ICD-10-CM | POA: Diagnosis not present

## 2021-11-25 DIAGNOSIS — E1159 Type 2 diabetes mellitus with other circulatory complications: Secondary | ICD-10-CM

## 2021-11-25 MED ORDER — FUROSEMIDE 40 MG PO TABS
40.0000 mg | ORAL_TABLET | Freq: Every day | ORAL | 3 refills | Status: DC
Start: 2021-11-25 — End: 2022-01-16

## 2021-11-25 MED ORDER — POTASSIUM CHLORIDE CRYS ER 20 MEQ PO TBCR: 40.0000 meq | EXTENDED_RELEASE_TABLET | Freq: Every day | ORAL | 3 refills | Status: DC

## 2021-11-25 MED ORDER — POTASSIUM CHLORIDE CRYS ER 20 MEQ PO TBCR: 40.0000 meq | EXTENDED_RELEASE_TABLET | Freq: Every day | ORAL | 0 refills | Status: DC

## 2021-11-25 NOTE — Progress Notes (Signed)
[name] would like palliative care and said she thought it had been ordered already by either her PCP or cardiology but she hasn't heard anything yet. Will make referral today.

## 2021-11-25 NOTE — Telephone Encounter (Signed)
Contacted patient's daughter to attempt to schedule the patient an appointment this week after pt's PCP contacted Darylene Price, NP for a request to see the patient for weight gain and need for increased diuretics. PCP also contacted Dr. [name] to see patient for poorly controlled afib.  [name] stated her mom going to multiple appointments has become too physically taxing for her mother. Since patient has to see Dr. [name] for atrial fibrillation, patient wishes to defer appointment with [place] at this time.  [name] stated they have been trying to get her mother set up with palliative care but she hasn't heard anything regarding that. Darylene Price, NP will put in referral to palliative care and contact patient's PCP for update.  [name] verbalized understanding to call Dr. [name] today to be seen as soon as possible. Instructed to call us if we can further assist or if they decide they would like an earlier appt with the HF clinic. Georg Ruddle, RN [facility]

## 2021-11-25 NOTE — Telephone Encounter (Signed)
Spoke with patient's daughter regarding the Palliative referral/services and all questions were answered and she was in agreement with scheduling visit.  I have scheduled an In-home Consult for 12/04/21 @ 9 AM

## 2021-11-25 NOTE — Patient Instructions (Signed)
Heart Failure, Self-Care Heart failure is a serious condition. The following information explains the things you need to do to take care of yourself after a heart failure diagnosis. You may be asked to change your diet, take certain medicines, and make other lifestyle changes in order to stay as healthy as possible. Your health care provider may also give you more specific instructions. If you have problems or questions, contact your health care provider. What are the risks? Having heart failure puts you at higher risk for certain problems. These problems can get worse if you do not take good care of yourself. Problems may include: Damage to the kidneys, liver, or lungs. Malnutrition. Abnormal heart rhythms. Blood clotting issues that could cause a stroke. Supplies needed: Scale for monitoring weight. Blood pressure monitor. Notebook. Medicines. How to care for yourself when you have heart failure Medicines Take over-the-counter and prescription medicines only as told by your health care provider. Medicines reduce the workload of your heart, slow the progression of heart failure, and improve symptoms. Take your medicines every day. Do not stop taking your medicine unless your health care provider tells you to do so. Do not skip any dose of medicine. Refill your prescriptions before you run out of medicine. Talk with your health care provider if you cannot afford your medicines. Eating and drinking  Eat heart-healthy foods. Talk with a dietitian to make an eating plan that is right for you. Limit salt (sodium) if told by your health care provider. Sodium restriction may reduce symptoms of heart failure. Ask a dietitian to recommend heart-healthy seasonings. Use healthy cooking methods instead of frying. Healthy methods include roasting, grilling, broiling, baking, poaching, steaming, and stir-frying. Choose foods that contain no trans fat and are low in saturated fat and cholesterol.  Healthy choices include fresh or frozen fruits and vegetables, fish, lean meats, legumes, fat-free or low-fat dairy products, and whole-grain or high-fiber foods. Limit your fluid intake, if directed by your health care provider. Fluid restriction may reduce symptoms of heart failure. Alcohol use Do not drink alcohol if: Your health care provider tells you not to drink. Your heart was damaged by alcohol, or you have severe heart failure. You are pregnant, may be pregnant, or are planning to become pregnant. If you drink alcohol: Limit how much you have to: 0-1 drink a day for women. 0-2 drinks a day for men. Know how much alcohol is in your drink. In the [place] one drink equals one 12 oz bottle of beer (355 mL), one 5 oz glass of wine (148 mL), or one 1 oz glass of hard liquor (44 mL). Lifestyle  Do not use any products that contain nicotine or tobacco. These products include cigarettes, chewing tobacco, and vaping devices, such as e-cigarettes. If you need help quitting, ask your health care provider. Do not use nicotine gum or patches before talking to your health care provider. Do not use illegal drugs. Work with your health care provider to safely reach the right body weight. Do physical activity if told by your health care provider. Talk to your health care provider before you begin an exercise if: You are an older adult. You have severe heart failure. Learn to manage stress. If you need help to do this, ask your health care provider. Participate in or seek physical rehabilitation as needed to keep or improve your independence and quality of life. Participate in a cardiac rehabilitation program, which is a treatment program to improve your health and well-being through  exercise training, education, and counseling. Plan rest periods when you get tired. Monitoring important information  Weigh yourself every day. This will help you to notice if too much fluid is building up in your  body. Weigh yourself every morning after you urinate and before you eat breakfast. Wear the same amount of clothing each time you weigh yourself. Record your daily weight. Provide your health care provider with your weight record. Monitor and record your pulse and blood pressure as told by your health care provider. Dealing with extreme temperatures If the weather is extremely hot: Avoid vigorous physical activity. Use air conditioning or fans, or find a cooler location. Avoid caffeine and alcohol. Wear loose-fitting, lightweight, and light-colored clothing. If the weather is extremely cold: Avoid vigorous activity. Layer your clothes. Wear mittens or gloves, a hat, and a face covering when you go outside. Avoid alcohol. Follow these instructions at home: Stay up to date with vaccines. Pneumococcal and flu (influenza) vaccines are especially important in preventing infections of the airways. Keep all follow-up visits. This is important. Contact a health care provider if you: Gain 2-3 lb (1-1.4 kg) in 24 hours or 5 lb (2.3 kg) in a week. Have increasing shortness of breath. Are unable to participate in your usual physical activities. Get tired easily. Cough more than normal, especially with physical activity. Lose your appetite or feel nauseous. Have any swelling or more swelling in areas such as your hands, feet, ankles, or abdomen. Are unable to sleep because it is hard to breathe. Feel like your heart is beating quickly (palpitations). Become dizzy or light-headed when you stand up. Have feelings of depression or sadness. Get help right away if you: Have trouble breathing. Notice, or your family notices, a change in your awareness, such as having trouble staying awake or concentrating. Have pain or discomfort in your chest. Have an episode of fainting (syncope). These symptoms may represent a serious problem that is an emergency. Do not wait to see if the symptoms will go away.  Get medical help right away. Call your local emergency services (911 in the [place] Do not drive yourself to the hospital. Summary Heart failure is a serious condition. To care for yourself, you may be asked to change your diet, take certain medicines, and make other lifestyle changes. Take your medicines every day. Do not stop taking them unless your health care provider tells you to do so. Limit salt and eat heart-healthy foods, such as fresh or frozen fruits and vegetables, fish, lean meats, legumes, fat-free or low-fat dairy products, and whole-grain or high-fiber foods. Ask your health care provider if you have any alcohol restrictions. You may have to stop drinking alcohol if you have severe heart failure. Contact your health care provider if you notice problems, such as rapid weight gain or a fast heartbeat. Get help right away if you faint or have chest pain or trouble breathing. This information is not intended to replace advice given to you by your health care provider. Make sure you discuss any questions you have with your health care provider. Document Revised: 07/06/2020 Document Reviewed: 07/06/2020 Elsevier Patient Education  2022 [employer].  Heart Failure, Diagnosis Heart failure is a condition in which the heart has trouble pumping blood. This may mean that the heart cannot pump enough blood out to the body or that the heart does not fill up with enough blood. For some people with heart failure, fluid may back up into the lungs. There may also be  swelling (edema) in the lower legs. Heart failure is usually a long-term (chronic) condition. It is important for you to take good care of yourself and follow the treatment plan from your health care provider. Different stages of heart failure have different treatment plans. The stages are: Stage A: At risk for heart failure. Having no symptoms of heart failure, but being at risk for developing heart failure. Stage B: Pre-heart  failure. Having no symptoms of heart failure, but having structural changes to the heart that indicate heart failure. Stage C: Symptomatic heart failure. Having symptoms of heart failure in addition to structural changes to the heart that indicate heart failure. Stage D: Advanced heart failure. Having symptoms that interfere with daily life and frequent hospitalizations related to heart failure. What are the causes? This condition may be caused by: High blood pressure (hypertension). Hypertension causes the heart muscle to work harder than normal. Coronary artery disease, or CAD. CAD is the buildup of cholesterol and fat (plaque) in the arteries of the heart. Heart attack, also called myocardial infarction. This injures the heart muscle, making it hard for the heart to pump blood. Abnormal heart valves. The valves do not open and close properly, forcing the heart to pump harder to keep the blood flowing. Heart muscle disease, inflammation, or infection (cardiomyopathy or myocarditis). This is damage to the heart muscle. It can increase the risk of heart failure. Lung disease. The heart works harder when the lungs are not healthy. What increases the risk? The risk of heart failure increases as a person ages. This condition is also more likely to develop in people who: Are obese. Use tobacco or nicotine products. Abuse alcohol or drugs. Have taken medicines that can damage the heart, such as chemotherapy drugs. Have any of these conditions: Diabetes. Abnormal heart rhythms. Thyroid problems. Low blood counts (anemia). Chronic kidney disease. Have a family history of heart failure. What are the signs or symptoms? Symptoms of this condition include: Shortness of breath with activity, such as when climbing stairs. A cough that does not go away. Swelling of the feet, ankles, legs, or abdomen. Losing or gaining weight for no reason. Trouble breathing when lying flat. Waking from sleep  because of the need to sit up and get more air. Rapid heartbeat. Other symptoms may include: Tiredness (fatigue) and loss of energy. Feeling light-headed, dizzy, or close to fainting. Nausea or loss of appetite. Waking up more often during the night to urinate (nocturia). Confusion. How is this diagnosed? This condition is diagnosed based on: Your medical history, symptoms, and a physical exam. Blood tests. Diagnostic tests, which may include: Echocardiogram. Electrocardiogram (ECG). Chest X-ray. Exercise stress test. Cardiac MRI. Cardiac catheterization and angiogram. Radionuclide scans. How is this treated? Treatment for this condition is aimed at managing the symptoms of heart failure. Medicines Treatment may include medicines that: Help lower blood pressure by relaxing (dilating) the blood vessels. These medicines are called ACE inhibitors (angiotensin-converting enzyme), ARBs (angiotensin receptor blockers), or vasodilators. Cause the kidneys to remove salt and water from the blood through urination (diuretics). Improve heart muscle strength and prevent the heart from beating too fast (beta blockers). Increase the force of the heartbeat (digoxin). Lower heart rates. Certain diabetes medicines (SGLT-2 inhibitors) may also be used in treatment. Healthy behavior changes Treatment may also include making healthy lifestyle changes, such as: Reaching and staying at a healthy weight. Not using tobacco or nicotine products. Eating heart-healthy foods. Limiting or avoiding alcohol. Stopping the use of illegal drugs.  Being physically active. Participating in a cardiac rehabilitation program, which is a treatment program to improve your health and well-being through exercise training, education, and counseling. Other treatments Other treatments may include: Procedures to open blocked arteries or repair damaged valves. Placing a pacemaker to improve heart function (cardiac  resynchronization therapy). Placing a device to treat serious abnormal heart rhythms (implantable cardioverter defibrillator, or ICD). Placing a device to improve the pumping ability of the heart (left ventricular assist device, or LVAD). Receiving a healthy heart from a donor (heart transplant). This is done when other treatments have not helped. Follow these instructions at home: Manage other health conditions as told by your health care provider. These may include hypertension, diabetes, thyroid disease, or abnormal heart rhythms. Get ongoing education and support as needed. Learn as much as you can about heart failure. Keep all follow-up visits. This is important. Where to find more information American Heart Association: www.heart.org Centers for Disease Control and Prevention: http://www.wolf.info/ NIH National Institute on Aging: http://kim-miller.com/ Summary Heart failure is a condition in which the heart has trouble pumping blood. This condition is commonly caused by high blood pressure and other diseases of the heart and lungs. Symptoms of this condition include shortness of breath, tiredness (fatigue), nausea, and swelling of the feet, ankles, legs, or abdomen. Treatments for this condition may include medicines, lifestyle changes, and surgery. Manage other health conditions as told by your health care provider. This information is not intended to replace advice given to you by your health care provider. Make sure you discuss any questions you have with your health care provider. Document Revised: 06/12/2021 Document Reviewed: 07/06/2020 Elsevier Patient Education  [place].

## 2021-11-25 NOTE — Telephone Encounter (Signed)
H/o Afib HR still not regulated fluctuating from 58 to 133 at home today 133   Call daughter for appt and f/u asap in clinic  Fax note to Dr. [name]

## 2021-11-25 NOTE — Progress Notes (Signed)
l °

## 2021-11-25 NOTE — Telephone Encounter (Signed)
Patient seen in office today and given written prescription

## 2021-11-25 NOTE — Progress Notes (Signed)
Chief Complaint  Patient presents with   Follow-up   F/u  1. Chf systolic with wt up hospital discharge from 171 to 178/9 home scales on lasix 20 mg qd prn, toprol xl 50 mg qd  Afib with HR going 55 to 133s  She is having sob and leg edema  2. Dm 2 recently saw my eye doctor will get records    Review of Systems  Constitutional:  Negative for weight loss.  HENT:  Negative for hearing loss.   Eyes:  Negative for blurred vision.  Respiratory:  Positive for shortness of breath.   Cardiovascular:  Positive for leg swelling. Negative for chest pain.  Gastrointestinal:  Negative for abdominal pain and blood in stool.  Genitourinary:  Negative for dysuria.  Musculoskeletal:  Negative for falls and joint pain.  Skin:  Negative for rash.  Neurological:  Negative for headaches.  Psychiatric/Behavioral:  Negative for depression.   Past Medical History:  Diagnosis Date   Arrhythmia    atrial fibrillation   CHF (congestive heart failure) (HCC)    Complication of anesthesia    Diabetes mellitus without complication ([place])    DVT (deep venous thrombosis) ([place])    06/02/2019   Hard of hearing    Hypertension    PAD (peripheral artery disease) ([place])    Past Surgical History:  Procedure Laterality Date   CESAREAN SECTION     COLONOSCOPY N/A 05/17/2019   Procedure: FLEXIBLE SIGMOIDOSCOPY;  Surgeon: Ileana Roup, MD;  Location: [facility];  Service: General;  Laterality: N/A;   COLONOSCOPY WITH PROPOFOL N/A 01/09/2019   Procedure: COLONOSCOPY WITH PROPOFOL;  Surgeon: Lin Landsman, MD;  Location: [facility];  Service: Gastroenterology;  Laterality: N/A;   ESOPHAGOGASTRODUODENOSCOPY (EGD) WITH PROPOFOL N/A 01/09/2019   Procedure: ESOPHAGOGASTRODUODENOSCOPY (EGD) WITH PROPOFOL;  Surgeon: Lin Landsman, MD;  Location: [facility];  Service: Gastroenterology;  Laterality: N/A;   KNEE SURGERY     left 2017/2018   LOWER EXTREMITY ANGIOGRAPHY Left 05/29/2021   Procedure: LOWER  EXTREMITY ANGIOGRAPHY;  Surgeon: Algernon Huxley, MD;  Location: [facility];  Service: Cardiovascular;  Laterality: Left;   LOWER EXTREMITY ANGIOGRAPHY Right 06/05/2021   Procedure: LOWER EXTREMITY ANGIOGRAPHY;  Surgeon: Algernon Huxley, MD;  Location: [facility];  Service: Cardiovascular;  Laterality: Right;   SHOULDER SURGERY     right   XI ROBOTIC ASSISTED LOWER ANTERIOR RESECTION N/A 05/17/2019   Procedure: XI ROBOTIC ASSISTED LOWER ANTERIOR RESECTION WITH END COLOSTOMY;  Surgeon: Ileana Roup, MD;  Location: [facility];  Service: General;  Laterality: N/A;   Family History  Problem Relation Age of Onset   Addison's disease Maternal Aunt    Heart disease Mother    Heart disease Father    Social History   Socioeconomic History   Marital status: Widowed    Spouse name: Not on file   Number of children: Not on file   Years of education: Not on file   Highest education level: Not on file  Occupational History   Not on file  Tobacco Use   Smoking status: Former    Years: 4.00    Types: Cigarettes   Smokeless tobacco: Never   Tobacco comments:    3 per day  Vaping Use   Vaping Use: Never used  Substance and Sexual Activity   Alcohol use: No   Drug use: No   Sexual activity: Not Currently  Other Topics Concern   Not on file  Social History Narrative   Retired Pharmacist, hospital    2 daughters lives with [name]    1 son deceased    Widowed    Moved from [place] Resource Strain: Low Risk    Difficulty of Paying Living Expenses: Not hard at all  Food Insecurity: No Food Insecurity   Worried About Charity fundraiser in the Last Year: Never true   Arboriculturist in the Last Year: Never true  Transportation Needs: No Transportation Needs   Lack of Transportation (Medical): No   Lack of Transportation (Non-Medical): No  Physical Activity: Insufficiently Active   Days of Exercise per Week: 3 days   Minutes of Exercise per  Session: 20 min  Stress: No Stress Concern Present   Feeling of Stress : Not at all  Social Connections: Unknown   Frequency of Communication with Friends and Family: More than three times a week   Frequency of Social Gatherings with Friends and Family: More than three times a week   Attends Religious Services: Not on Electrical engineer or Organizations: Not on file   Attends Archivist Meetings: Not on file   Marital Status: Not on file  Intimate Partner Violence: Not At Risk   Fear of Current or Ex-Partner: No   Emotionally Abused: No   Physically Abused: No   Sexually Abused: No   Current Meds  Medication Sig   Cholecalciferol (D3-1000) 25 MCG (1000 UT) capsule Take 2,000 Units by mouth daily.   clopidogrel (PLAVIX) 75 MG tablet Take 1 tablet (75 mg total) by mouth daily.   glimepiride (AMARYL) 2 MG tablet TAKE 1 TABLET DAILY WITH   BREAKFAST   lovastatin (MEVACOR) 20 MG tablet TAKE 1 TABLET DAILY AT 6PM   metoprolol succinate (TOPROL-XL) 50 MG 24 hr tablet Take 1 tablet (50 mg total) by mouth daily. D/c 25 and 100 mg   potassium chloride SA (KLOR-CON M) 20 MEQ tablet Take 2 tablets (40 mEq total) by mouth daily.   potassium chloride SA (KLOR-CON M) 20 MEQ tablet Take 2 tablets (40 mEq total) by mouth daily.   [DISCONTINUED] furosemide (LASIX) 20 MG tablet Take 1 tablet (20 mg total) by mouth daily as needed.   Allergies  Allergen Reactions   Metformin And Related     Diarrhea    Recent Results (from the past 2160 hour(s))  Brain natriuretic peptide     Status: Abnormal   Collection Time: 10/31/21 11:12 AM  Result Value Ref Range   B Natriuretic Peptide 355.8 (H) 0.0 - 100.0 pg/mL    Comment: Performed at [facility], [address], [place], [place] [address]  Resp Panel by RT-PCR (Flu A&B, Covid) Nasopharyngeal Swab     Status: None   Collection Time: 10/31/21 11:12 AM   Specimen: Nasopharyngeal Swab; Nasopharyngeal(NP) swabs in vial  transport medium  Result Value Ref Range   SARS Coronavirus 2 by RT PCR NEGATIVE NEGATIVE    Comment: (NOTE) SARS-CoV-2 target nucleic acids are NOT DETECTED.  The SARS-CoV-2 RNA is generally detectable in upper respiratory specimens during the acute phase of infection. The lowest concentration of SARS-CoV-2 viral copies this assay can detect is 138 copies/mL. A negative result does not preclude SARS-Cov-2 infection and should not be used as the sole basis for treatment or other patient management decisions. A negative result may occur with  improper specimen collection/handling, submission of specimen  other than nasopharyngeal swab, presence of viral mutation(s) within the areas targeted by this assay, and inadequate number of viral copies(<138 copies/mL). A negative result must be combined with clinical observations, patient history, and epidemiological information. The expected result is Negative.  Fact Sheet for Patients:  EntrepreneurPulse.com.au  Fact Sheet for Healthcare Providers:  IncredibleEmployment.be  This test is no t yet approved or cleared by the [place] FDA and  has been authorized for detection and/or diagnosis of SARS-CoV-2 by FDA under an Emergency Use Authorization (EUA). This EUA will remain  in effect (meaning this test can be used) for the duration of the COVID-19 declaration under Section 564(b)(1) of the Act, 21 U.S.C.section 360bbb-3(b)(1), unless the authorization is terminated  or revoked sooner.       Influenza A by PCR NEGATIVE NEGATIVE   Influenza B by PCR NEGATIVE NEGATIVE    Comment: (NOTE) The Xpert Xpress SARS-CoV-2/FLU/RSV plus assay is intended as an aid in the diagnosis of influenza from Nasopharyngeal swab specimens and should not be used as a sole basis for treatment. Nasal washings and aspirates are unacceptable for Xpert Xpress SARS-CoV-2/FLU/RSV testing.  Fact Sheet for  Patients: EntrepreneurPulse.com.au  Fact Sheet for Healthcare Providers: IncredibleEmployment.be  This test is not yet approved or cleared by the [place] FDA and has been authorized for detection and/or diagnosis of SARS-CoV-2 by FDA under an Emergency Use Authorization (EUA). This EUA will remain in effect (meaning this test can be used) for the duration of the COVID-19 declaration under Section 564(b)(1) of the Act, 21 U.S.C. section 360bbb-3(b)(1), unless the authorization is terminated or revoked.  Performed at [facility], [address] [place], [place] [address]   Basic metabolic panel     Status: Abnormal   Collection Time: 10/31/21 11:14 AM  Result Value Ref Range   Sodium 140 135 - 145 mmol/L   Potassium 3.3 (L) 3.5 - 5.1 mmol/L   Chloride 100 98 - 111 mmol/L   CO2 31 22 - 32 mmol/L   Glucose, Bld 117 (H) 70 - 99 mg/dL    Comment: Glucose reference range applies only to samples taken after fasting for at least 8 hours.   BUN 13 8 - 23 mg/dL   Creatinine, Ser 1.02 (H) 0.44 - 1.00 mg/dL   Calcium 9.2 8.9 - 10.3 mg/dL   GFR, Estimated 53 (L) >60 mL/min    Comment: (NOTE) Calculated using the CKD-EPI Creatinine Equation (2021)    Anion gap 9 5 - 15    Comment: Performed at [facility], [address] [place], [place] [address]  CBC     Status: Abnormal   Collection Time: 10/31/21 11:14 AM  Result Value Ref Range   WBC 12.8 (H) 4.0 - 10.5 K/uL   RBC 4.78 3.87 - 5.11 MIL/uL   Hemoglobin 13.8 12.0 - 15.0 g/dL   HCT 44.2 36.0 - 46.0 %   MCV 92.5 80.0 - 100.0 fL   MCH 28.9 26.0 - 34.0 pg   MCHC 31.2 30.0 - 36.0 g/dL   RDW 12.5 11.5 - 15.5 %   Platelets 170 150 - 400 K/uL   nRBC 0.0 0.0 - 0.2 %    Comment: Performed at [facility], [address] [place], [place] [address]  Magnesium     Status: None   Collection Time: 10/31/21 11:14 AM  Result Value Ref Range   Magnesium 2.1 1.7 - 2.4  mg/dL    Comment: Performed at [facility], [place], [place] [address]  Troponin I (High Sensitivity)     Status: None   Collection Time: 10/31/21 11:15 AM  Result Value Ref Range   Troponin I (High Sensitivity) 17 <18 ng/L    Comment: (NOTE) Elevated high sensitivity troponin I (hsTnI) values and significant  changes across serial measurements may suggest ACS but many other  chronic and acute conditions are known to elevate hsTnI results.  Refer to the "Links" section for chest pain algorithms and additional  guidance. Performed at [facility], [address] [place], [place] [address]   Magnesium     Status: Abnormal   Collection Time: 10/31/21 11:15 AM  Result Value Ref Range   Magnesium 2.5 (H) 1.7 - 2.4 mg/dL    Comment: Performed at [facility], [address] [place], [place] [address]  Comprehensive metabolic panel     Status: Abnormal   Collection Time: 10/31/21 11:15 AM  Result Value Ref Range   Sodium 139 135 - 145 mmol/L   Potassium 4.5 3.5 - 5.1 mmol/L   Chloride 104 98 - 111 mmol/L   CO2 28 22 - 32 mmol/L   Glucose, Bld 151 (H) 70 - 99 mg/dL    Comment: Glucose reference range applies only to samples taken after fasting for at least 8 hours.   BUN 18 8 - 23 mg/dL   Creatinine, Ser 1.13 (H) 0.44 - 1.00 mg/dL   Calcium 9.8 8.9 - 10.3 mg/dL   Total Protein 7.5 6.5 - 8.1 g/dL   Albumin 4.1 3.5 - 5.0 g/dL   AST 18 15 - 41 U/L   ALT 10 0 - 44 U/L   Alkaline Phosphatase 55 38 - 126 U/L   Total Bilirubin 1.4 (H) 0.3 - 1.2 mg/dL   GFR, Estimated 47 (L) >60 mL/min    Comment: (NOTE) Calculated using the CKD-EPI Creatinine Equation (2021)    Anion gap 7 5 - 15    Comment: Performed at [facility], [address] [place], [place] [address]  CBC with Differential/Platelet     Status: Abnormal   Collection Time: 10/31/21 11:15 AM  Result Value Ref Range   WBC 11.6 (H) 4.0 - 10.5 K/uL   RBC 4.73 3.87 - 5.11  MIL/uL   Hemoglobin 13.8 12.0 - 15.0 g/dL   HCT 42.9 36.0 - 46.0 %   MCV 90.7 80.0 - 100.0 fL   MCH 29.2 26.0 - 34.0 pg   MCHC 32.2 30.0 - 36.0 g/dL   RDW 14.4 11.5 - 15.5 %   Platelets 332 150 - 400 K/uL   nRBC 0.0 0.0 - 0.2 %   Neutrophils Relative % 58 %   Neutro Abs 6.8 1.7 - 7.7 K/uL   Lymphocytes Relative 26 %   Lymphs Abs 3.0 0.7 - 4.0 K/uL   Monocytes Relative 12 %   Monocytes Absolute 1.3 (H) 0.1 - 1.0 K/uL   Eosinophils Relative 3 %   Eosinophils Absolute 0.3 0.0 - 0.5 K/uL   Basophils Relative 1 %   Basophils Absolute 0.1 0.0 - 0.1 K/uL   Immature Granulocytes 0 %   Abs Immature Granulocytes 0.04 0.00 - 0.07 K/uL    Comment: Performed at [facility], [address] [place], [place] [address]  CBG monitoring, ED     Status: Abnormal   Collection Time: 10/31/21  6:07 PM  Result Value Ref Range   Glucose-Capillary 149 (H) 70 - 99 mg/dL  Comment: Glucose reference range applies only to samples taken after fasting for at least 8 hours.  Glucose, capillary     Status: Abnormal   Collection Time: 10/31/21 11:12 PM  Result Value Ref Range   Glucose-Capillary 174 (H) 70 - 99 mg/dL    Comment: Glucose reference range applies only to samples taken after fasting for at least 8 hours.  Glucose, capillary     Status: Abnormal   Collection Time: 11/01/21  7:23 AM  Result Value Ref Range   Glucose-Capillary 109 (H) 70 - 99 mg/dL    Comment: Glucose reference range applies only to samples taken after fasting for at least 8 hours.  Glucose, capillary     Status: Abnormal   Collection Time: 11/01/21 12:15 PM  Result Value Ref Range   Glucose-Capillary 176 (H) 70 - 99 mg/dL    Comment: Glucose reference range applies only to samples taken after fasting for at least 8 hours.  Glucose, capillary     Status: Abnormal   Collection Time: 11/01/21  4:14 PM  Result Value Ref Range   Glucose-Capillary 116 (H) 70 - 99 mg/dL    Comment: Glucose reference range applies only to  samples taken after fasting for at least 8 hours.  Glucose, capillary     Status: Abnormal   Collection Time: 11/01/21  9:40 PM  Result Value Ref Range   Glucose-Capillary 178 (H) 70 - 99 mg/dL    Comment: Glucose reference range applies only to samples taken after fasting for at least 8 hours.  Glucose, capillary     Status: Abnormal   Collection Time: 11/02/21  7:26 AM  Result Value Ref Range   Glucose-Capillary 111 (H) 70 - 99 mg/dL    Comment: Glucose reference range applies only to samples taken after fasting for at least 8 hours.  ECHOCARDIOGRAM COMPLETE     Status: None   Collection Time: 11/02/21 10:26 AM  Result Value Ref Range   Weight 2,865.6 oz   Height 67 in   BP 119/72 mmHg   S' Lateral 2.55 cm  Glucose, capillary     Status: Abnormal   Collection Time: 11/02/21 12:12 PM  Result Value Ref Range   Glucose-Capillary 212 (H) 70 - 99 mg/dL    Comment: Glucose reference range applies only to samples taken after fasting for at least 8 hours.  Glucose, capillary     Status: Abnormal   Collection Time: 11/02/21  4:20 PM  Result Value Ref Range   Glucose-Capillary 123 (H) 70 - 99 mg/dL    Comment: Glucose reference range applies only to samples taken after fasting for at least 8 hours.  Glucose, capillary     Status: Abnormal   Collection Time: 11/02/21  9:33 PM  Result Value Ref Range   Glucose-Capillary 165 (H) 70 - 99 mg/dL    Comment: Glucose reference range applies only to samples taken after fasting for at least 8 hours.  Basic metabolic panel     Status: Abnormal   Collection Time: 11/03/21  4:17 AM  Result Value Ref Range   Sodium 145 135 - 145 mmol/L   Potassium 3.6 3.5 - 5.1 mmol/L   Chloride 101 98 - 111 mmol/L   CO2 28 22 - 32 mmol/L   Glucose, Bld 128 (H) 70 - 99 mg/dL    Comment: Glucose reference range applies only to samples taken after fasting for at least 8 hours.   BUN 25 (H) 8 - 23 mg/dL  Creatinine, Ser 1.04 (H) 0.44 - 1.00 mg/dL   Calcium 9.6  8.9 - 10.3 mg/dL   GFR, Estimated 51 (L) >60 mL/min    Comment: (NOTE) Calculated using the CKD-EPI Creatinine Equation (2021)    Anion gap 16 (H) 5 - 15    Comment: Performed at [facility], [address] [place], [place] [address]  CBC with Differential/Platelet     Status: Abnormal   Collection Time: 11/03/21  4:17 AM  Result Value Ref Range   WBC 13.8 (H) 4.0 - 10.5 K/uL   RBC 4.49 3.87 - 5.11 MIL/uL   Hemoglobin 13.2 12.0 - 15.0 g/dL   HCT 40.7 36.0 - 46.0 %   MCV 90.6 80.0 - 100.0 fL   MCH 29.4 26.0 - 34.0 pg   MCHC 32.4 30.0 - 36.0 g/dL   RDW 14.2 11.5 - 15.5 %   Platelets 306 150 - 400 K/uL   nRBC 0.0 0.0 - 0.2 %   Neutrophils Relative % 57 %   Neutro Abs 7.8 (H) 1.7 - 7.7 K/uL   Lymphocytes Relative 26 %   Lymphs Abs 3.6 0.7 - 4.0 K/uL   Monocytes Relative 14 %   Monocytes Absolute 1.9 (H) 0.1 - 1.0 K/uL   Eosinophils Relative 3 %   Eosinophils Absolute 0.4 0.0 - 0.5 K/uL   Basophils Relative 0 %   Basophils Absolute 0.1 0.0 - 0.1 K/uL   Immature Granulocytes 0 %   Abs Immature Granulocytes 0.05 0.00 - 0.07 K/uL    Comment: Performed at [facility], [address] [place], [place] [address]  Glucose, capillary     Status: Abnormal   Collection Time: 11/03/21  8:09 AM  Result Value Ref Range   Glucose-Capillary 145 (H) 70 - 99 mg/dL    Comment: Glucose reference range applies only to samples taken after fasting for at least 8 hours.  Glucose, capillary     Status: Abnormal   Collection Time: 11/03/21 11:52 AM  Result Value Ref Range   Glucose-Capillary 184 (H) 70 - 99 mg/dL    Comment: Glucose reference range applies only to samples taken after fasting for at least 8 hours.  Glucose, capillary     Status: Abnormal   Collection Time: 11/03/21  3:44 PM  Result Value Ref Range   Glucose-Capillary 169 (H) 70 - 99 mg/dL    Comment: Glucose reference range applies only to samples taken after fasting for at least 8 hours.  Glucose, capillary      Status: Abnormal   Collection Time: 11/03/21  9:10 PM  Result Value Ref Range   Glucose-Capillary 134 (H) 70 - 99 mg/dL    Comment: Glucose reference range applies only to samples taken after fasting for at least 8 hours.  Glucose, capillary     Status: Abnormal   Collection Time: 11/04/21  7:18 AM  Result Value Ref Range   Glucose-Capillary 134 (H) 70 - 99 mg/dL    Comment: Glucose reference range applies only to samples taken after fasting for at least 8 hours.  Glucose, capillary     Status: Abnormal   Collection Time: 11/04/21 11:37 AM  Result Value Ref Range   Glucose-Capillary 180 (H) 70 - 99 mg/dL    Comment: Glucose reference range applies only to samples taken after fasting for at least 8 hours.   Objective  Body mass index is 31.48 kg/m. Wt Readings from Last 3 Encounters:  11/25/21 183 lb 6.4 oz (83.2 kg)  11/17/21 177 lb 8 oz (80.5 kg)  11/04/21 173 lb 1 oz (78.5 kg)   Temp Readings from Last 3 Encounters:  11/25/21 (!) 97.4 F (36.3 C) (Temporal)  11/04/21 98.1 F (36.7 C)  06/05/21 98.6 F (37 C) (Oral)   BP Readings from Last 3 Encounters:  11/25/21 118/80  11/17/21 116/71  11/04/21 (!) 114/59   Pulse Readings from Last 3 Encounters:  11/25/21 (!) 116  11/17/21 (!) 105  11/04/21 (!) 110    Physical Exam Vitals and nursing note reviewed.  Constitutional:      Appearance: Normal appearance. She is well-developed and well-groomed.  HENT:     Head: Normocephalic and atraumatic.  Eyes:     Conjunctiva/sclera: Conjunctivae normal.     Pupils: Pupils are equal, round, and reactive to light.  Cardiovascular:     Rate and Rhythm: Tachycardia present. Rhythm irregular.     Heart sounds: Normal heart sounds. No murmur heard.    Comments: In afib Pulmonary:     Effort: Pulmonary effort is normal.     Breath sounds: Normal breath sounds.  Abdominal:     General: Abdomen is flat. Bowel sounds are normal.     Tenderness: There is no abdominal tenderness.   Musculoskeletal:        General: No tenderness.     Right lower leg: 1+ Pitting Edema present.     Left lower leg: 1+ Pitting Edema present.  Skin:    General: Skin is warm and dry.  Neurological:     General: No focal deficit present.     Mental Status: She is alert and oriented to person, place, and time. Mental status is at baseline.     Cranial Nerves: Cranial nerves 2-12 are intact.     Gait: Gait is intact.  Psychiatric:        Attention and Perception: Attention and perception normal.        Mood and Affect: Mood and affect normal.        Speech: Speech normal.        Behavior: Behavior normal. Behavior is cooperative.        Thought Content: Thought content normal.        Cognition and Memory: Cognition and memory normal.        Judgment: Judgment normal.    Assessment  Plan  Chronic systolic heart failure (HCC) and Afib unstable with rate not controlled and sob EF 35-40% Lasix 40 mg qd due to wt up to 178 and having sob With 40 mg K daily  Leg edema - Plan: furosemide (LASIX) 40 MG tablet SOB (shortness of breath) on exertion Needs to f/u Dr. [name]  Goal wt 171 to mid 170s  Hypokalemia - Plan: potassium chloride SA (KLOR-CON M) 20 MEQ tablet, potassium chloride SA (KLOR-CON M) 20 MEQ tablet bid  Leukocytosis declines hematology for now  At high risk for falls Rx rollator today  Type 2 diabetes mellitus with stage 3 chronic kidney disease, without long-term current use of insulin, unspecified whether stage 3a or 3b CKD (HCC) Cont amaryl 2 mg  Hypertension associated with diabetes ([place])  See meds BP controlled  Records my eye doctor  HM Had flu shot utd utd prevnar  pna 23 utd  covid pfizer 5/5  did have shingles shot in past but 11/29/20 Rx Tdap and shingrix    Tdap and shingrix consider will disc in future again  MMR immune    Never had colonoscopy cologuard +  03/28/16 see chart declined colonoscopy s/p rectal mass excision due again f/u 12/2019; left  colostomy bag long term by Dr. Gerald Stabs white, colonoscopy Dr. Marius Ditch No pap  mammo out of age window  dexa 01/21/14 normal    WBC 11.5 noted 05/31/18 labs wbc 11.7 declines hematology 11/25/21    Reviewed CT ab/pelvis 02/11/18 b/l atrophic kidneys kidney right side and b/l kidney scarring, 1.3 cm gallstone, diverticulosis, aortic calcifications, MV calcification.    Provider: Dr. Olivia Mackie McLean-Scocuzza-Internal Medicine

## 2021-11-26 ENCOUNTER — Ambulatory Visit (INDEPENDENT_AMBULATORY_CARE_PROVIDER_SITE_OTHER): Payer: Medicare HMO

## 2021-11-26 VITALS — Ht 64.0 in | Wt 183.0 lb

## 2021-11-26 DIAGNOSIS — Z0000 Encounter for general adult medical examination without abnormal findings: Secondary | ICD-10-CM | POA: Diagnosis not present

## 2021-11-26 NOTE — Progress Notes (Signed)
Subjective:   Heidi Whitaker is a 87 y.o. female who presents for Medicare Annual (Subsequent) preventive examination.  Review of Systems    No ROS.  Medicare Wellness Virtual Visit.  Visual/audio telehealth visit, UTA vital signs.   See social history for additional risk factors.   Cardiac Risk Factors include: advanced age (>25men, >39 women);hypertension;diabetes mellitus     Objective:    Today's Vitals   11/26/21 1236  Weight: 183 lb (83 kg)  Height: 5\' 4"  (1.626 m)   Body mass index is 31.41 kg/m.  Advanced Directives 11/26/2021 11/01/2021 10/31/2021 05/15/2021 05/14/2021 11/25/2020 11/16/2019  Does Patient Have a Medical Advance Directive? Yes - No - No Yes No  Type of Paramedic of [place];Living will - - - - Press photographer;Living will -  Does patient want to make changes to medical advance directive? No - Patient declined - - - - No - Patient declined -  Copy of [place] in Chart? Yes - validated most recent copy scanned in chart (See row information) - - - - No - copy requested -  Would patient like information on creating a medical advance directive? - No - Patient declined - No - Patient declined - - No - Patient declined    Current Medications (verified) Outpatient Encounter Medications as of 11/26/2021  Medication Sig   Cholecalciferol (D3-1000) 25 MCG (1000 UT) capsule Take 2,000 Units by mouth daily.   clopidogrel (PLAVIX) 75 MG tablet Take 1 tablet (75 mg total) by mouth daily.   furosemide (LASIX) 40 MG tablet Take 1 tablet (40 mg total) by mouth daily. D/c 20   glimepiride (AMARYL) 2 MG tablet TAKE 1 TABLET DAILY WITH   BREAKFAST   lovastatin (MEVACOR) 20 MG tablet TAKE 1 TABLET DAILY AT 6PM   metoprolol succinate (TOPROL-XL) 50 MG 24 hr tablet Take 1 tablet (50 mg total) by mouth daily. D/c 25 and 100 mg   potassium chloride SA (KLOR-CON M) 20 MEQ tablet Take 2 tablets (40 mEq total) by mouth  daily.   potassium chloride SA (KLOR-CON M) 20 MEQ tablet Take 2 tablets (40 mEq total) by mouth daily.   No facility-administered encounter medications on file as of 11/26/2021.    Allergies (verified) Metformin and related   History: Past Medical History:  Diagnosis Date   Arrhythmia    atrial fibrillation   CHF (congestive heart failure) (HCC)    Complication of anesthesia    Diabetes mellitus without complication (HCC)    DVT (deep venous thrombosis) ([place])    06/02/2019   Hard of hearing    Hypertension    PAD (peripheral artery disease) ([place])    Past Surgical History:  Procedure Laterality Date   CESAREAN SECTION     COLONOSCOPY N/A 05/17/2019   Procedure: FLEXIBLE SIGMOIDOSCOPY;  Surgeon: Ileana Roup, MD;  Location: [facility];  Service: General;  Laterality: N/A;   COLONOSCOPY WITH PROPOFOL N/A 01/09/2019   Procedure: COLONOSCOPY WITH PROPOFOL;  Surgeon: Lin Landsman, MD;  Location: [facility] ENDOSCOPY;  Service: Gastroenterology;  Laterality: N/A;   ESOPHAGOGASTRODUODENOSCOPY (EGD) WITH PROPOFOL N/A 01/09/2019   Procedure: ESOPHAGOGASTRODUODENOSCOPY (EGD) WITH PROPOFOL;  Surgeon: Lin Landsman, MD;  Location: [facility];  Service: Gastroenterology;  Laterality: N/A;   KNEE SURGERY     left 2017/2018   LOWER EXTREMITY ANGIOGRAPHY Left 05/29/2021   Procedure: LOWER EXTREMITY ANGIOGRAPHY;  Surgeon: Algernon Huxley, MD;  Location: [facility];  Service: Cardiovascular;  Laterality: Left;   LOWER EXTREMITY ANGIOGRAPHY Right 06/05/2021   Procedure: LOWER EXTREMITY ANGIOGRAPHY;  Surgeon: Algernon Huxley, MD;  Location: [place];  Service: Cardiovascular;  Laterality: Right;   SHOULDER SURGERY     right   XI ROBOTIC ASSISTED LOWER ANTERIOR RESECTION N/A 05/17/2019   Procedure: XI ROBOTIC ASSISTED LOWER ANTERIOR RESECTION WITH END COLOSTOMY;  Surgeon: Ileana Roup, MD;  Location: [facility];  Service: General;  Laterality: N/A;   Family History   Problem Relation Age of Onset   Addison's disease Maternal Aunt    Heart disease Mother    Heart disease Father    Social History   Socioeconomic History   Marital status: Widowed    Spouse name: Not on file   Number of children: Not on file   Years of education: Not on file   Highest education level: Not on file  Occupational History   Not on file  Tobacco Use   Smoking status: Former    Years: 4.00    Types: Cigarettes   Smokeless tobacco: Never   Tobacco comments:    3 per day  Vaping Use   Vaping Use: Never used  Substance and Sexual Activity   Alcohol use: No   Drug use: No   Sexual activity: Not Currently  Other Topics Concern   Not on file  Social History Narrative   Retired Pharmacist, hospital    2 daughters lives with [place]    1 son deceased    Widowed    Moved from [place] Strain: Low Risk    Difficulty of Paying Living Expenses: Not hard at all  Food Insecurity: No Food Insecurity   Worried About Charity fundraiser in the Last Year: Never true   Arboriculturist in the Last Year: Never true  Transportation Needs: No Transportation Needs   Lack of Transportation (Medical): No   Lack of Transportation (Non-Medical): No  Physical Activity: Insufficiently Active   Days of Exercise per Week: 3 days   Minutes of Exercise per Session: 20 min  Stress: No Stress Concern Present   Feeling of Stress : Not at all  Social Connections: Unknown   Frequency of Communication with Friends and Family: More than three times a week   Frequency of Social Gatherings with Friends and Family: More than three times a week   Attends Religious Services: Not on Electrical engineer or Organizations: Not on file   Attends Archivist Meetings: Not on file   Marital Status: Not on file    Tobacco Counseling Counseling given: Not Answered Tobacco comments: 3 per day   Clinical Intake:  Pre-visit preparation  completed: Yes       Nutrition Risk Assessment: Does the patient have any non-healing wounds?  No   Diabetes: How often do you monitor your CBG's? Monitored in office with pcp.   Financial Strains and Diabetes Management: Does the patient want to be seen by Chronic Care Management for management of their diabetes?  No  Would the patient like to be referred to a Nutritionist or for Diabetic Management?  No   Diabetic Exams: Foot exam- patient notes no change in feet. Denies wounds, numbness, tingling. Followed by pcp.   Eye exam- UTD   How often do you need to have someone help you when you read instructions, pamphlets, or other written materials  from your doctor or pharmacy?: 1 - Never  Interpreter Needed?: No    Activities of Daily Living In your present state of health, do you have any difficulty performing the following activities: 11/26/2021 11/17/2021  Hearing? Tempie Donning  Vision? Y Y  Comment Hearimg aids -  Difficulty concentrating or making decisions? Tempie Donning  Walking or climbing stairs? N N  Comment Paces self -  Dressing or bathing? N N  Doing errands, shopping? Y Y  Comment Does not drive -  Preparing Food and eating ? N -  Comment Daughter assist as needed with meal prep. Self feeds. -  Using the Toilet? N -  In the past six months, have you accidently leaked urine? N -  Do you have problems with loss of bowel control? N -  Managing your Medications? Y -  Comment Daughter manages -  Managing your Finances? Y -  Comment Daughter manages -  Housekeeping or managing your Housekeeping? Y -  Comment Daughter assist -  Some recent data might be hidden    Patient Care Team: McLean-Scocuzza, Nino Glow, MD as PCP - General (Internal Medicine)  Indicate any recent Medical Services you may have received from other than [facility] providers in the past year (date may be approximate).     Assessment:   This is a routine wellness examination for Aubrii.  Virtual Visit via  Telephone Note  I connected with  Heidi Whitaker on 11/26/21 at 12:30 PM EST by telephone and verified that I am speaking with the correct person using two identifiers.  Location: Patient: home Provider: office Persons participating in the virtual visit: patient/Nurse Health Advisor   I discussed the limitations, risks, security and privacy concerns of performing an evaluation and management service by telephone and the availability of in person appointments. The patient expressed understanding and agreed to proceed.  Interactive audio and video telecommunications were attempted between this nurse and patient, however failed, due to patient having technical difficulties OR patient did not have access to video capability.  We continued and completed visit with audio only.  Some vital signs may be absent or patient reported.   Hearing/Vision screen Hearing Screening - Comments:: Patient is able to hear conversational tones without difficulty.  No issues reported. Vision Screening - Comments:: Visual acuity not assessed, virtual visit. They have seen their ophthalmologist in the last 12 months.  Dietary issues and exercise activities discussed: Current Exercise Habits: Home exercise routine, Intensity: Mild Healthy diet Good water intake   Goals Addressed             This Visit's Progress    Follow up with Primary Care Provider       As needed       Depression Screen PHQ 2/9 Scores 11/26/2021 11/17/2021 11/25/2020 03/05/2020 11/16/2019 09/30/2018  PHQ - 2 Score 0 0 0 0 0 0    Fall Risk Fall Risk  11/26/2021 11/17/2021 05/21/2021 01/08/2021 11/29/2020  Falls in the past year? 0 0 0 0 0  Number falls in past yr: 0 0 0 - 0  Injury with Fall? - 0 0 - 0  Risk Factor Category  - - - - -  Risk for fall due to : - Impaired mobility Impaired balance/gait - -  Follow up Falls evaluation completed Falls evaluation completed;Education provided;Falls prevention discussed Falls  evaluation completed - Falls evaluation completed    FALL RISK PREVENTION PERTAINING TO THE HOME: Home free of loose throw rugs in walkways,  pet beds, electrical cords, etc? Yes  Adequate lighting in your home to reduce risk of falls? Yes   ASSISTIVE DEVICES UTILIZED TO PREVENT FALLS: Life alert? No  Use of a cane, walker or w/c? No  Grab bars in the bathroom? Yes   TIMED UP AND GO: Was the test performed? No .   Cognitive Function:  Patient is alert and oriented x3.    6CIT Screen 11/16/2019  What Year? 0 points  What month? 0 points  What time? 0 points  Count back from 20 0 points  Months in reverse 0 points    Immunizations Immunization History  Administered Date(s) Administered   Fluad Quad(high Dose 65+) 09/08/2019, 09/24/2020, 09/20/2021   Influenza, High Dose Seasonal PF 09/30/2018   Moderna Covid-19 Vaccine Bivalent Booster 47yrs & up 09/20/2021   PFIZER Comirnaty(Gray Top)Covid-19 Tri-Sucrose Vaccine 03/28/2021   PFIZER(Purple Top)SARS-COV-2 Vaccination 01/08/2020, 01/29/2020, 09/27/2020   Pneumococcal Conjugate-13 01/01/2018   Pneumococcal Polysaccharide-23 11/01/2019   TDAP status: Due, Education has been provided regarding the importance of this vaccine. Advised may receive this vaccine at local pharmacy or Health Dept. Aware to provide a copy of the vaccination record if obtained from local pharmacy or Health Dept. Verbalized acceptance and understanding.  Shingrix Completed?: No.    Education has been provided regarding the importance of this vaccine. Patient has been advised to call insurance company to determine out of pocket expense if they have not yet received this vaccine. Advised may also receive vaccine at local pharmacy or Health Dept. Verbalized acceptance and understanding.  Screening Tests Health Maintenance  Topic Date Due   FOOT EXAM  03/05/2021   HEMOGLOBIN A1C  11/14/2021   URINE MICROALBUMIN  11/29/2021   Zoster Vaccines- Shingrix (1 of  2) 02/24/2022 (Originally 12/03/1948)   DEXA SCAN  11/26/2022 (Originally 12/03/1996)   COLONOSCOPY (Pts 45-25yrs Insurance coverage will need to be confirmed)  11/26/2022 (Originally 05/16/2020)   TETANUS/TDAP  11/26/2022 (Originally 12/03/1948)   OPHTHALMOLOGY EXAM  10/11/2022   Pneumonia Vaccine 9+ Years old  Completed   INFLUENZA VACCINE  Completed   COVID-19 Vaccine  Completed   HPV VACCINES  Aged Out   Health Maintenance Health Maintenance Due  Topic Date Due   FOOT EXAM  03/05/2021   HEMOGLOBIN A1C  11/14/2021   URINE MICROALBUMIN  11/29/2021   Bone density- declined  Lung Cancer Screening: (Low Dose CT Chest recommended if Age 52-80 years, 30 pack-year currently smoking OR have quit w/in 15years.) does not qualify.   Hepatitis C Screening: does not qualify  Vision Screening: Recommended annual ophthalmology exams for early detection of glaucoma and other disorders of the eye.  Dental Screening: Recommended annual dental exams for proper oral hygiene  Community Resource Referral / Chronic Care Management: CRR required this visit?  No   CCM required this visit?  No      Plan:   Keep all routine maintenance appointments.   I have personally reviewed and noted the following in the patient's chart:   Medical and social history Use of alcohol, tobacco or illicit drugs  Current medications and supplements including opioid prescriptions. Not taking opioid.  Functional ability and status Nutritional status Physical activity Advanced directives List of other physicians Hospitalizations, surgeries, and ER visits in previous 12 months Vitals Screenings to include cognitive, depression, and falls Referrals and appointments  In addition, I have reviewed and discussed with patient certain preventive protocols, quality metrics, and best practice recommendations. A written personalized care plan for  preventive services as well as general preventive health recommendations were  provided to patient.     Varney Biles, LPN   [id number]

## 2021-11-26 NOTE — Patient Instructions (Addendum)
  Heidi Whitaker , Thank you for taking time to come for your Medicare Wellness Visit. I appreciate your ongoing commitment to your health goals. Please review the following plan we discussed and let me know if I can assist you in the future.   These are the goals we discussed:  Goals      Follow up with Primary Care Provider     As needed        This is a list of the screening recommended for you and due dates:  Health Maintenance  Topic Date Due   Complete foot exam   03/05/2021   Hemoglobin A1C  11/14/2021   Urine Protein Check  11/29/2021   Zoster (Shingles) Vaccine (1 of 2) 02/24/2022*   DEXA scan (bone density measurement)  11/26/2022*   Colon Cancer Screening  11/26/2022*   Tetanus Vaccine  11/26/2022*   Eye exam for diabetics  10/11/2022   Pneumonia Vaccine  Completed   Flu Shot  Completed   COVID-19 Vaccine  Completed   HPV Vaccine  Aged Out  *Topic was postponed. The date shown is not the original due date.

## 2021-11-27 ENCOUNTER — Telehealth: Payer: Self-pay | Admitting: Family

## 2021-11-27 NOTE — Telephone Encounter (Signed)
Spoke with patient's daughter, Suzi Roots about her mom's HR. She says that it can range from 58 to upper 120's. She says that patient doesn't have any shortness of breath or dizziness. Swelling appears to be under control with extra diuretic.   She was concerned about whether the wide HR fluctuation was normal or if she needed to be worried about it. She has an upcoming appointment with Dr. [name] on 12/15/21 but she hasn't heard from his office since she called them about the HR.   Patient is currently taking metoprolol succinate 50mg  daily. Patient is adamant that she doesn't want to go back to the hospital and just wants to die at home. Palliative care referral was placed and they are coming to see patient next week.   Advised daughter that wide HR fluctuations can be normal with atrial fibrillation and since she's not having symptoms, I didn't think we needed to adjust her metoprolol. Concerned that if we increase her metoprolol, she may get even lower HR readings. Instructed Deb to check her HR a couple of times a day or if she notices that patient is having any symptoms and reach back out to me if this occurs, at least until they see Dr. [name] on the 19th.   Deb also mentions that patient has said numerous times that she is ready to die and wants to die at home. Discussed that if that is how she is feeling, she may be more hospice appropriate but that discussion can be held in greater detail with palliative care next week.   Deb voiced understanding and was appreciative of the information and for the palliative care referral.

## 2021-11-27 NOTE — Telephone Encounter (Signed)
Per the patients daughter your note was not sent to Dr. [name]. Could you resend.

## 2021-11-28 ENCOUNTER — Other Ambulatory Visit: Payer: Self-pay

## 2021-11-28 ENCOUNTER — Telehealth: Payer: Self-pay | Admitting: Internal Medicine

## 2021-11-28 DIAGNOSIS — Z933 Colostomy status: Secondary | ICD-10-CM | POA: Diagnosis not present

## 2021-11-28 DIAGNOSIS — K56609 Unspecified intestinal obstruction, unspecified as to partial versus complete obstruction: Secondary | ICD-10-CM | POA: Diagnosis not present

## 2021-11-28 MED ORDER — POTASSIUM CHLORIDE CRYS ER 10 MEQ PO TBCR: EXTENDED_RELEASE_TABLET | ORAL | 1 refills | Status: DC

## 2021-11-28 NOTE — Telephone Encounter (Signed)
Agree 

## 2021-11-28 NOTE — Telephone Encounter (Signed)
I spoke with patient's daughter & I let her know we received fax confirmation of note.

## 2021-11-28 NOTE — Telephone Encounter (Signed)
I have faxed back awaiting a confirmation.

## 2021-11-28 NOTE — Telephone Encounter (Signed)
Per Dr. Olivia Mackie I have also sent in the smaller dose of potassium the 10 MEQ for patient to take two tablets (20 MEQ) by mouth twice a day to equal the 40 MEQ. Pt's daughter aware sent to pharmacy.

## 2021-11-28 NOTE — Telephone Encounter (Signed)
Patient's pharmacy called. Patient called them and stated she is having a hard time swallowing the potassium chloride SA (KLOR-CON M) 20 MEQ tablet. If possible could the provider change to a 10 meq tablet and have patient take 2 at a time.

## 2021-12-01 NOTE — Telephone Encounter (Signed)
Addressed 11/28/21

## 2021-12-04 ENCOUNTER — Encounter: Payer: Self-pay | Admitting: Nurse Practitioner

## 2021-12-04 ENCOUNTER — Other Ambulatory Visit: Payer: Self-pay

## 2021-12-04 ENCOUNTER — Other Ambulatory Visit: Payer: Medicare HMO | Admitting: Nurse Practitioner

## 2021-12-04 DIAGNOSIS — R0602 Shortness of breath: Secondary | ICD-10-CM

## 2021-12-04 DIAGNOSIS — R5381 Other malaise: Secondary | ICD-10-CM

## 2021-12-04 DIAGNOSIS — Z515 Encounter for palliative care: Secondary | ICD-10-CM | POA: Diagnosis not present

## 2021-12-04 DIAGNOSIS — I5022 Chronic systolic (congestive) heart failure: Secondary | ICD-10-CM | POA: Diagnosis not present

## 2021-12-04 NOTE — Progress Notes (Signed)
[facility] Palliative Care Consult Note Telephone: [phone]  Fax: [phone]   Date of encounter: 12/04/21 11:30 AM PATIENT NAME: Heidi Whitaker [id number]   [phone] (home)  DOB: 1929/08/22 MRN: [id number] PRIMARY CARE PROVIDER:    McLean-Scocuzza, Nino Glow, MD,  [place] [address] [phone]  REFERRING PROVIDER:   McLean-Scocuzza, Nino Glow, MD [place],  [place] [address] [phone]  RESPONSIBLE PARTY:    Contact Information     Name Relation Home Work [address]   Reather Laurence Daughter [phone]  [phone]   [facility] Relative [phone]        I met face to face with patient and family in home. Palliative Care was asked to follow this patient by consultation request of  McLean-Scocuzza, Olivia Mackie * to address advance care planning and complex medical decision making. This is the initial visit.                  ASSESSMENT AND PLAN / RECOMMENDATIONS:  Advance Care Planning/Goals of Care: Goals include to maximize quality of life and symptom management. Patient/health care surrogate gave his/her permission to discuss.Our advance care planning conversation included a discussion about:    The value and importance of advance care planning  Experiences with loved ones who have been seriously ill or have died  Exploration of personal, cultural or spiritual beliefs that might influence medical decisions  Exploration of goals of care in the event of a sudden injury or illness  Identification and preparation of a healthcare agent  Review and updating or creation of an  advance directive document . Decision not to resuscitate or to de-escalate disease focused treatments due to poor prognosis. CODE STATUS: DNR  Symptom Management/Plan: 1. Advance Care Planning; Discussed code status, made DNR, goldenrod form completed; discussed most form, blank most form left in home for  further discussion amongst family  2. Shortness of breath secondary to CHF, continue to monitor weights; edema. Discussed keeping legs elevated as compression hose is difficult for Ms. Fasig to put on and off. I discussed at length about diet, ras. We talked about CHF clinic. We talked about community paramedic program, will see if she would be eligible. Discussed varied hr from sustaining 120-130's to 50's. Discussed holter monitor. Will f/u with Darylene Price NP CHF clinic to see if this is an option as Ms. Alire is in agreement to wear.   3. Debility secondary to CHF, discussed at challenges with shortness of breath walking up the stairs. We talked about energy conservation. We talked about quality of life. We talked about increase in fatigue with exertion. We talked about sleep patterns, sleep hygiene. [name] endorses Ms. Lasseigne does take multiple naps during the day.   4. Goals of Care: Goals include to maximize quality of life and symptom management. Our advance care planning conversation included a discussion about:    The value and importance of advance care planning  Exploration of personal, cultural or spiritual beliefs that might influence medical decisions  Exploration of goals of care in the event of a sudden injury or illness  Identification and preparation of a healthcare agent  Review and updating or creation of an advance directive document.  5. Palliative care encounter; Palliative care encounter; Palliative medicine team will continue to support patient, patient's family, and medical team. Visit consisted of counseling and education dealing with the complex and emotionally intense issues of symptom management and palliative care  in the setting of serious and potentially life-threatening illness  6. f/u 2 month for ongoing monitoring chronic disease progression, ongoing discussions complex medical decision making  Follow up Palliative Care Visit: Palliative care will  continue to follow for complex medical decision making, advance care planning, and clarification of goals. Return 8 weeks or prn.  I spent 76 minutes providing this consultation. More than 50% of the time in this consultation was spent in counseling and care coordination. PPS: 50%  HOSPICE ELIGIBILITY/DIAGNOSIS: TBD  Chief Complaint: Initial consult for palliative consult for complex medical decision making  HISTORY OF PRESENT ILLNESS:  Heidi Whitaker is a 87 y.o. year old female  with multiple medial problems including CHF, Afib, h/o DVT, HOH, PAD, HTN, DM. I called [name], Heidi Whitaker to confirm PC initial visit. Deborah in agreement with covid screen negative. We talked about the last time Heidi Whitaker was independent at home which was about 4 years ago. We talked about at that time her husband passed away. She was residing in [place]. Heidi Whitaker relocated to [place] to live with her daughter [name]. We talked about past medical history with multiple family members having afib, CHF. We talked about life review. Heidi Whitaker has 2 adult living daughters and 1 son deceased from alcoholism. We talked about dx CHF, hospitalization. We talked about CHF clinic. [name] talked at length about Cardiology visit. [name] talked about focus to keep Heidi Whitaker out of the hospital and minimize office appointments. We talked about community paramedic program. Will contact CHF clinic to see if eligible.  We talked about weights staying relatively within 2 to 3 lbs with mild to moderate edema. We talked about elevation of legs, compression wraps. Ms. Slight endorses it is hard to put on and take off. We talked about sleep patterns. Heidi Whitaker endorses she has a lot of trouble staying asleep, sometimes falling asleep. Heidi Whitaker endorses she does have multiple sleep aid she could use but chooses not too. [name] endorses Heidi Whitaker does nap multiple times a day. We talked about appetite which  is fair. We talked about medical goals including code status, wishes are to be a DNR. Goldenrod completed will put in vynca. We talked about MOST form, blank copy left for further family discussion. We talked about debility. We talked about endurance walking up the stairs with increase in hr, tachycardia from 90 to 130's or higher. [name] endorses her normal hr runs 120's. Sometimes it will drop to 50 with afib. Discussed possibility of holter monitor. Ms Frontera in agreement. We talked about role pc in poc. We talked about f/u pc visit will have PC RN make phone call in 4 weeks then [facility] NP f/u 8 weeks. Ms. Legner in agreement. Scheduled appointment. Therapeutic listening, emotional support provided. Questions answered.   History obtained from review of EMR, discussion with Daughter, [name] and Ms. Cornacchia.  I reviewed available labs, medications, imaging, studies and related documents from the EMR.  Records reviewed and summarized above.   ROS Full 10 system review of systems performed and negative with exception of: as per HPI.   Physical Exam: Constitutional: NAD General: pleasant elderly female EYES: lids intact ENMT: oral mucous membranes moist CV: S1S2, RRR, +BLE edema Pulmonary: LCTA, no increased work of breathing, no cough Abdomen normo-active BS + 4 quadrants, soft and non tender MSK: ambulatory Skin: warm and dry Neuro:  no generalized weakness,  no cognitive impairment Psych: non-anxious affect, A and O x 3  CURRENT PROBLEM LIST:  Patient Active Problem List   Diagnosis Date Noted   Chronic systolic heart failure ([place]) 11/25/2021   At high risk for falls 11/25/2021   Acute on chronic systolic CHF (congestive heart failure) ([place]) 10/31/2021   Type II diabetes mellitus with renal manifestations ([place]) 10/31/2021   Stroke ([place]) 10/31/2021   Acute respiratory failure with hypoxia ([place]) 10/31/2021   PAD (peripheral artery disease) ([place]) 05/21/2021   Valvular heart disease  09/32/6712   Acute systolic congestive heart failure ([place]) 05/21/2021   Cerebral infarction involving left cerebellar artery ([place]) 05/21/2021   Dyspnea 05/15/2021   Generalized weakness 05/14/2021   Stage 3a chronic kidney disease ([place]) 11/29/2020   Hypertension associated with diabetes ([place]) 11/29/2020   Aortic atherosclerosis ([place]) 11/29/2020   Tubulovillous adenoma of rectum 03/05/2020   Vitamin D deficiency 06/09/2019   Atrial fibrillation with RVR ([place]) 06/09/2019   Leukocytosis 06/09/2019   Acute deep vein thrombosis (DVT) of distal vein of left lower extremity ([place]) 06/09/2019   Leg edema 06/09/2019   Diabetes mellitus type 2 in obese (HCC)    Rectal mass 05/17/2019   Insomnia 09/30/2018   Abnormal gait 09/30/2018   Bilateral impacted cerumen 09/30/2018   Chronic diarrhea 09/30/2018   Type 2 diabetes mellitus with stage 3 chronic kidney disease, without long-term current use of insulin ([place]) 09/30/2018   Hyperlipidemia 09/30/2018   Nosebleed 09/30/2018   Does use hearing aid 09/30/2018   Positive colorectal cancer screening using Cologuard test 08/12/2018   HTN (hypertension) 08/12/2018   PAST MEDICAL HISTORY:  Active Ambulatory Problems    Diagnosis Date Noted   Positive colorectal cancer screening using Cologuard test 08/12/2018   HTN (hypertension) 08/12/2018   Insomnia 09/30/2018   Abnormal gait 09/30/2018   Bilateral impacted cerumen 09/30/2018   Chronic diarrhea 09/30/2018   Type 2 diabetes mellitus with stage 3 chronic kidney disease, without long-term current use of insulin ([place]) 09/30/2018   Hyperlipidemia 09/30/2018   Nosebleed 09/30/2018   Does use hearing aid 09/30/2018   Rectal mass 05/17/2019   Diabetes mellitus type 2 in obese ([place])    Vitamin D deficiency 06/09/2019   Atrial fibrillation with RVR ([place]) 06/09/2019   Leukocytosis 06/09/2019   Acute deep vein thrombosis (DVT) of distal vein of left lower extremity (HCC) 06/09/2019   Leg edema  06/09/2019   Tubulovillous adenoma of rectum 03/05/2020   Stage 3a chronic kidney disease ([place]) 11/29/2020   Hypertension associated with diabetes ([place]) 11/29/2020   Aortic atherosclerosis ([place]) 11/29/2020   Generalized weakness 05/14/2021   Dyspnea 05/15/2021   PAD (peripheral artery disease) ([place]) 05/21/2021   Valvular heart disease 45/80/9983   Acute systolic congestive heart failure ([place]) 05/21/2021   Cerebral infarction involving left cerebellar artery ([place]) 05/21/2021   Acute on chronic systolic CHF (congestive heart failure) ([place]) 10/31/2021   Type II diabetes mellitus with renal manifestations ([place]) 10/31/2021   Stroke ([place]) 10/31/2021   Acute respiratory failure with hypoxia ([place]) 38/25/0539   Chronic systolic heart failure ([place]) 11/25/2021   At high risk for falls 11/25/2021   Resolved Ambulatory Problems    Diagnosis Date Noted   No Resolved Ambulatory Problems   Past Medical History:  Diagnosis Date   Arrhythmia    CHF (congestive heart failure) ([place])    Complication of anesthesia    Diabetes mellitus without complication ([place])    DVT (deep venous thrombosis) (HCC)    Hard of hearing    Hypertension    SOCIAL  HX:  Social History   Tobacco Use   Smoking status: Former    Years: 4.00    Types: Cigarettes   Smokeless tobacco: Never   Tobacco comments:    3 per day  Substance Use Topics   Alcohol use: No   FAMILY HX:  Family History  Problem Relation Age of Onset   Addison's disease Maternal Aunt    Heart disease Mother    Heart disease Father     reviewed  ALLERGIES:  Allergies  Allergen Reactions   Metformin And Related     Diarrhea      PERTINENT MEDICATIONS:  Outpatient Encounter Medications as of 12/04/2021  Medication Sig   Cholecalciferol (D3-1000) 25 MCG (1000 UT) capsule Take 2,000 Units by mouth daily.   clopidogrel (PLAVIX) 75 MG tablet Take 1 tablet (75 mg total) by mouth daily.   furosemide (LASIX) 40 MG tablet Take 1 tablet (40 mg  total) by mouth daily. D/c 20   glimepiride (AMARYL) 2 MG tablet TAKE 1 TABLET DAILY WITH   BREAKFAST   lovastatin (MEVACOR) 20 MG tablet TAKE 1 TABLET DAILY AT 6PM   metoprolol succinate (TOPROL-XL) 50 MG 24 hr tablet Take 1 tablet (50 mg total) by mouth daily. D/c 25 and 100 mg   potassium chloride (KLOR-CON M) 10 MEQ tablet Take two tablets (20 MEQ) by mouth twice a day.   No facility-administered encounter medications on file as of 12/04/2021.   Thank you for the opportunity to participate in the care of Ms. Katen.  The palliative care team will continue to follow. Please call our office at [phone] if we can be of additional assistance.   Questions and concerns were addressed. The patient/family was encouraged to call with questions and/or concerns. My business card was provided. Provided general support and encouragement, no other unmet needs identified   This chart was dictated using voice recognition software.  Despite best efforts to proofread,  errors can occur which can change the documentation meaning.   Shonn Farruggia Z Kasyn Rolph, NP ,   COVID-19 PATIENT SCREENING TOOL Asked and negative response unless otherwise noted:  Have you had symptoms of covid, tested positive or been in contact with someone with symptoms/positive test in the past 5-10 days? No

## 2021-12-10 ENCOUNTER — Other Ambulatory Visit (HOSPITAL_COMMUNITY): Payer: Self-pay

## 2021-12-10 ENCOUNTER — Encounter (HOSPITAL_COMMUNITY): Payer: Self-pay

## 2021-12-10 NOTE — Progress Notes (Signed)
Had a first home visit with Heidi Whitaker and her daughter today.  She has no complaints today.  She did states she was more short of breath a couple of days ago but has subsided.  She lives with daughter and husband,  she has a bedroom upstairs with a lot of steps to it.  She is able to climb it once or twice daily.  She does have a stool in the middle to rest and another stool at top of the steps.  She stays active in the home.  She does go to the mailbox once a day and walk her dog outside in the neighborhood.  She helps with simple chores in the home.  She is able to dress and bath herself.  She walks with no assistance.  She does normally get short of breath with ambulation and aware when to stop and rest.  She is doing better since they have increased her fluid pill to 40 mg and she is urinating a lot more.  Little edema in ankle area today.  Lungs are clear.  She is always in afib per daughter and she is irregular pulse today with rate of 108.  She denies any chest pain, headache,  or dizziness.  She has all her medications and aware of how to take them.  She has everything for daily living.  She discussed her up coming cardiology appts and HF appts. She has palliative that visits.  Explained the program and gave my contact information to them.  They are wanting to be part of the program.  Discussed fluids and high sodium foods.  Will continue to visit for heart failure.   [place] [phone]

## 2021-12-11 DIAGNOSIS — I700 Atherosclerosis of aorta: Secondary | ICD-10-CM | POA: Diagnosis not present

## 2021-12-11 DIAGNOSIS — N1831 Chronic kidney disease, stage 3a: Secondary | ICD-10-CM | POA: Diagnosis not present

## 2021-12-11 DIAGNOSIS — E1151 Type 2 diabetes mellitus with diabetic peripheral angiopathy without gangrene: Secondary | ICD-10-CM | POA: Diagnosis not present

## 2021-12-11 DIAGNOSIS — E559 Vitamin D deficiency, unspecified: Secondary | ICD-10-CM | POA: Diagnosis not present

## 2021-12-11 DIAGNOSIS — I4891 Unspecified atrial fibrillation: Secondary | ICD-10-CM | POA: Diagnosis not present

## 2021-12-11 DIAGNOSIS — E1122 Type 2 diabetes mellitus with diabetic chronic kidney disease: Secondary | ICD-10-CM | POA: Diagnosis not present

## 2021-12-11 DIAGNOSIS — I152 Hypertension secondary to endocrine disorders: Secondary | ICD-10-CM | POA: Diagnosis not present

## 2021-12-11 DIAGNOSIS — J9601 Acute respiratory failure with hypoxia: Secondary | ICD-10-CM | POA: Diagnosis not present

## 2021-12-11 DIAGNOSIS — I5023 Acute on chronic systolic (congestive) heart failure: Secondary | ICD-10-CM | POA: Diagnosis not present

## 2021-12-11 DIAGNOSIS — E1159 Type 2 diabetes mellitus with other circulatory complications: Secondary | ICD-10-CM | POA: Diagnosis not present

## 2021-12-19 ENCOUNTER — Telehealth: Payer: Self-pay | Admitting: Internal Medicine

## 2021-12-19 NOTE — Telephone Encounter (Signed)
Heidi Whitaker from [facility] called in regards to pt. Heidi Whitaker wanted to report pts weight gain. Pt is eating more and has gained two lbs. Week one pt weighed 174.8. Week 2 pt weighed 176.4. This week (week 3) pt weighs 176.4. Heidi Whitaker can be reached at [phone]

## 2021-12-23 NOTE — Telephone Encounter (Signed)
Could be due to diet but weight gain needs to be reported to cardiology not PCP inform

## 2021-12-23 NOTE — Telephone Encounter (Signed)
For your information  

## 2021-12-23 NOTE — Telephone Encounter (Signed)
Called and message was given to [employer]

## 2021-12-25 DIAGNOSIS — E559 Vitamin D deficiency, unspecified: Secondary | ICD-10-CM | POA: Diagnosis not present

## 2021-12-25 DIAGNOSIS — I152 Hypertension secondary to endocrine disorders: Secondary | ICD-10-CM | POA: Diagnosis not present

## 2021-12-25 DIAGNOSIS — I4891 Unspecified atrial fibrillation: Secondary | ICD-10-CM | POA: Diagnosis not present

## 2021-12-25 DIAGNOSIS — N1831 Chronic kidney disease, stage 3a: Secondary | ICD-10-CM | POA: Diagnosis not present

## 2021-12-25 DIAGNOSIS — J9601 Acute respiratory failure with hypoxia: Secondary | ICD-10-CM | POA: Diagnosis not present

## 2021-12-25 DIAGNOSIS — E1122 Type 2 diabetes mellitus with diabetic chronic kidney disease: Secondary | ICD-10-CM | POA: Diagnosis not present

## 2021-12-25 DIAGNOSIS — I700 Atherosclerosis of aorta: Secondary | ICD-10-CM | POA: Diagnosis not present

## 2021-12-25 DIAGNOSIS — I5023 Acute on chronic systolic (congestive) heart failure: Secondary | ICD-10-CM | POA: Diagnosis not present

## 2021-12-25 DIAGNOSIS — E1151 Type 2 diabetes mellitus with diabetic peripheral angiopathy without gangrene: Secondary | ICD-10-CM | POA: Diagnosis not present

## 2021-12-25 DIAGNOSIS — E1159 Type 2 diabetes mellitus with other circulatory complications: Secondary | ICD-10-CM | POA: Diagnosis not present

## 2021-12-30 ENCOUNTER — Other Ambulatory Visit: Payer: Self-pay

## 2021-12-30 MED ORDER — POTASSIUM CHLORIDE CRYS ER 10 MEQ PO TBCR: EXTENDED_RELEASE_TABLET | ORAL | 1 refills | Status: DC

## 2021-12-30 NOTE — Telephone Encounter (Signed)
Pts daughter called in regards to pts medication. Pts daughter is requesting that we sent pts potassium chloride 10mg  tablet to [facility] mail order pharmacy. [name] states [facility] is only giving her 87 at a time so she is having to take more trips than necessary. She would like to know if the prescription can be written for 90 pills instead of 30.

## 2021-12-30 NOTE — Telephone Encounter (Signed)
Called and verified with Patient daughter that she is still taking the 10 MEQ potassium two tablets twice daily, total of 4 tablets a day. [name] verified this dose. Sent in 90 day supply to [facility] mail delivery per request.   [name] informed and verbalized understanding.

## 2021-12-31 ENCOUNTER — Other Ambulatory Visit: Payer: Self-pay | Admitting: Family

## 2021-12-31 ENCOUNTER — Other Ambulatory Visit (HOSPITAL_COMMUNITY): Payer: Self-pay

## 2021-12-31 DIAGNOSIS — Z933 Colostomy status: Secondary | ICD-10-CM | POA: Diagnosis not present

## 2021-12-31 DIAGNOSIS — K56609 Unspecified intestinal obstruction, unspecified as to partial versus complete obstruction: Secondary | ICD-10-CM | POA: Diagnosis not present

## 2022-01-01 ENCOUNTER — Encounter (HOSPITAL_COMMUNITY): Payer: Self-pay

## 2022-01-01 ENCOUNTER — Other Ambulatory Visit: Payer: Self-pay

## 2022-01-01 ENCOUNTER — Other Ambulatory Visit
Admission: RE | Admit: 2022-01-01 | Discharge: 2022-01-01 | Disposition: A | Discharge status: Home / Self Care | Payer: Medicare HMO | Source: Ambulatory Visit | Attending: Family | Admitting: Family

## 2022-01-01 DIAGNOSIS — I5022 Chronic systolic (congestive) heart failure: Secondary | ICD-10-CM | POA: Diagnosis present

## 2022-01-01 DIAGNOSIS — N1831 Chronic kidney disease, stage 3a: Secondary | ICD-10-CM | POA: Diagnosis not present

## 2022-01-01 DIAGNOSIS — I5023 Acute on chronic systolic (congestive) heart failure: Secondary | ICD-10-CM | POA: Diagnosis not present

## 2022-01-01 DIAGNOSIS — I700 Atherosclerosis of aorta: Secondary | ICD-10-CM | POA: Diagnosis not present

## 2022-01-01 DIAGNOSIS — I4891 Unspecified atrial fibrillation: Secondary | ICD-10-CM | POA: Diagnosis not present

## 2022-01-01 DIAGNOSIS — E559 Vitamin D deficiency, unspecified: Secondary | ICD-10-CM | POA: Diagnosis not present

## 2022-01-01 DIAGNOSIS — J9601 Acute respiratory failure with hypoxia: Secondary | ICD-10-CM | POA: Diagnosis not present

## 2022-01-01 DIAGNOSIS — E1122 Type 2 diabetes mellitus with diabetic chronic kidney disease: Secondary | ICD-10-CM | POA: Diagnosis not present

## 2022-01-01 DIAGNOSIS — E1159 Type 2 diabetes mellitus with other circulatory complications: Secondary | ICD-10-CM | POA: Diagnosis not present

## 2022-01-01 DIAGNOSIS — I152 Hypertension secondary to endocrine disorders: Secondary | ICD-10-CM | POA: Diagnosis not present

## 2022-01-01 DIAGNOSIS — E1151 Type 2 diabetes mellitus with diabetic peripheral angiopathy without gangrene: Secondary | ICD-10-CM | POA: Diagnosis not present

## 2022-01-01 LAB — BASIC METABOLIC PANEL
Anion gap: 8 (ref 5–15)
BUN: 25 mg/dL — ABNORMAL HIGH (ref 8–23)
CO2: 24 mmol/L (ref 22–32)
Calcium: 9.1 mg/dL (ref 8.9–10.3)
Chloride: 103 mmol/L (ref 98–111)
Creatinine, Ser: 1.17 mg/dL — ABNORMAL HIGH (ref 0.44–1.00)
GFR, Estimated: 44 mL/min — ABNORMAL LOW (ref >60)
Glucose, Bld: 188 mg/dL — ABNORMAL HIGH (ref 70–99)
Potassium: 4.1 mmol/L (ref 3.5–5.1)
Sodium: 135 mmol/L (ref 135–145)

## 2022-01-01 NOTE — Progress Notes (Signed)
[place] contacted me from [facility] and advised she seen the lab results from today.  She is wanting an order for fluids because her kidney function.  Contacted Tina with HF clinic and her labs for her kidney function is same as 1 month ago.  She advised no fluids, to continue the 20 mg furosemide and 40 mg furosemide everyother day.  She advised kidney function is stable.  I was on speaker with Steffanie Dunn and Via's daughter and explained this to both of them.  They appeared to understand the reasoning why not to give fluids.  Will make a home visit on Monday next week.   [place] [phone]

## 2022-01-01 NOTE — Progress Notes (Signed)
Had a home visit with Heidi Whitaker.  Her main complaint is dry mouth, she states she can not stand it.  Last week Heidi Whitaker with HF clinic advised to watch her weight and take 40 mg furosemide everyother day and 20 mg on opposite days.  She has done that for about 5 days.  Her weight has fluctuated about 1 lbs up and down.  She states urinates all the time.  She is drinking more water than she had been, she still not drinking close to 60 ounces a day.  She drinks 1 cup of water with milk, juice and ice cream rest of day.  She eats her big meal for the day around 2 pm and snacks at night on healthy things.  She eats breakfast daily but not as much as she was.  She lives with her daughter that takes care of her meds.  She weighs daily but does not like to.  Contacted Heidi Whitaker and asked her about blood work and she has set her up for tomorrow to check to see if too dry.  She has cardiology appt with Dr [name] as a new patient at the end of the month. She has slight edema in ankle areas, looks better than when I was here last month.  She denies any chest pain, headaches, dizziness or increased shortness of breath.  She is always in afib and palliative is wanting cardiology to do a 7 day monitor on her.  Lung sounds are clear.  Advised her importance of weighing and drinking enough fluids.  Will continue to visit for heart failure, diet and medication management.   [place] [phone]

## 2022-01-02 ENCOUNTER — Telehealth: Payer: Self-pay

## 2022-01-02 NOTE — Telephone Encounter (Addendum)
Message and below lab results called to daughter, Jackelyn Poling. Hilda Blades acknowledged and stated they will keep taking medications the same and will not miss their cardiology appointment later this month. Georg Ruddle, RN ----- Message from Alisa Graff, [place] sent at 01/01/2022  1:11 PM EST ----- Potassium and sodium level are normals. Kidney function is within her range. Continue furosemide as 40mg  QOD alternating with 20mg  QOD. Keep cardiology appointment later this month.

## 2022-01-05 ENCOUNTER — Other Ambulatory Visit (HOSPITAL_COMMUNITY): Payer: Self-pay

## 2022-01-05 ENCOUNTER — Encounter (HOSPITAL_COMMUNITY): Payer: Self-pay

## 2022-01-05 NOTE — Progress Notes (Signed)
Today had a home visit with Heidi Whitaker.  Had a visit because she told her daughter she was having a bad day.  She is very dry mouth today and some shortness of breath.  She appears to be doing same.  After discussing with her daughter she drank very little yesterday.  She states she has drank more this morning than she did all day yesterday.  She states feeling better, just dry mouth.  She states was dizzy but has went away.  She states dont have as much energy and gets more short of breath climbing steps.  She did get up and walk and her rate goes up to around 130's.  Resting it is about 108 today.  Lungs are clear, no more swelling than her normal.  She denies any pain.  She states taking more naps than normal.  She has a bedroom upstairs and she goes up the stairs once a day now.  She still does little things around the home.  Appetite is still good but does not eat a lot.  She is not a big protein eater.  Tried discussing everything to try to get her to intake more fluids but can not find anything.  Daughter is aware of blood work and appears to understand that she is stable.  Will continue to visit for heart failure.   [place] [phone]

## 2022-01-09 ENCOUNTER — Telehealth: Payer: Self-pay | Admitting: Family

## 2022-01-09 ENCOUNTER — Telehealth: Payer: Self-pay | Admitting: Internal Medicine

## 2022-01-09 DIAGNOSIS — E1151 Type 2 diabetes mellitus with diabetic peripheral angiopathy without gangrene: Secondary | ICD-10-CM | POA: Diagnosis not present

## 2022-01-09 DIAGNOSIS — I5023 Acute on chronic systolic (congestive) heart failure: Secondary | ICD-10-CM | POA: Diagnosis not present

## 2022-01-09 DIAGNOSIS — I700 Atherosclerosis of aorta: Secondary | ICD-10-CM | POA: Diagnosis not present

## 2022-01-09 DIAGNOSIS — I4891 Unspecified atrial fibrillation: Secondary | ICD-10-CM | POA: Diagnosis not present

## 2022-01-09 DIAGNOSIS — I152 Hypertension secondary to endocrine disorders: Secondary | ICD-10-CM | POA: Diagnosis not present

## 2022-01-09 DIAGNOSIS — E1122 Type 2 diabetes mellitus with diabetic chronic kidney disease: Secondary | ICD-10-CM | POA: Diagnosis not present

## 2022-01-09 DIAGNOSIS — N1831 Chronic kidney disease, stage 3a: Secondary | ICD-10-CM | POA: Diagnosis not present

## 2022-01-09 DIAGNOSIS — E559 Vitamin D deficiency, unspecified: Secondary | ICD-10-CM | POA: Diagnosis not present

## 2022-01-09 DIAGNOSIS — E1159 Type 2 diabetes mellitus with other circulatory complications: Secondary | ICD-10-CM | POA: Diagnosis not present

## 2022-01-09 DIAGNOSIS — J9601 Acute respiratory failure with hypoxia: Secondary | ICD-10-CM | POA: Diagnosis not present

## 2022-01-09 NOTE — Telephone Encounter (Signed)
Returned call to [facility] nurse, [place]. She says that patient has increased fatigue, increased SOB, dry cough and increased edema. Weight about the same and lungs are clear.   Currently taking furosemide 40mg  QOD alternating with 20mg  QOD. Last BMP on 01/01/22 showed potassium 4.1, creatinine 1.17 with GFR of 44.   Thamas Jaegers to have patient take furosemide 40mg  every day through the weekend and reassess on Monday. [name] says that she will be going out to see the patient on Monday. Gave verbal order to draw a BMET at that time and fax results to [place]. She verbalized understanding.

## 2022-01-09 NOTE — Telephone Encounter (Signed)
[name] called from [facility] called regarding the pt having increase shortness of breath, increase fatigue, dry cough, 2 to 3 plus pitting ademia. Pt is on lasix 40 mg and she is alternating between 40 and 20 mg every other day. [name] wanted to talk to [place] or provider

## 2022-01-12 DIAGNOSIS — N1831 Chronic kidney disease, stage 3a: Secondary | ICD-10-CM | POA: Diagnosis not present

## 2022-01-12 DIAGNOSIS — J9601 Acute respiratory failure with hypoxia: Secondary | ICD-10-CM | POA: Diagnosis not present

## 2022-01-12 DIAGNOSIS — E1159 Type 2 diabetes mellitus with other circulatory complications: Secondary | ICD-10-CM | POA: Diagnosis not present

## 2022-01-12 DIAGNOSIS — I700 Atherosclerosis of aorta: Secondary | ICD-10-CM | POA: Diagnosis not present

## 2022-01-12 DIAGNOSIS — I152 Hypertension secondary to endocrine disorders: Secondary | ICD-10-CM | POA: Diagnosis not present

## 2022-01-12 DIAGNOSIS — I5023 Acute on chronic systolic (congestive) heart failure: Secondary | ICD-10-CM | POA: Diagnosis not present

## 2022-01-12 DIAGNOSIS — E559 Vitamin D deficiency, unspecified: Secondary | ICD-10-CM | POA: Diagnosis not present

## 2022-01-12 DIAGNOSIS — I4891 Unspecified atrial fibrillation: Secondary | ICD-10-CM | POA: Diagnosis not present

## 2022-01-12 DIAGNOSIS — E1151 Type 2 diabetes mellitus with diabetic peripheral angiopathy without gangrene: Secondary | ICD-10-CM | POA: Diagnosis not present

## 2022-01-12 DIAGNOSIS — E1122 Type 2 diabetes mellitus with diabetic chronic kidney disease: Secondary | ICD-10-CM | POA: Diagnosis not present

## 2022-01-12 NOTE — Telephone Encounter (Signed)
Noted  

## 2022-01-12 NOTE — Telephone Encounter (Signed)
Not sure what help she needs but happy to shall it arise appt is in 2 weeks with cardiology

## 2022-01-12 NOTE — Telephone Encounter (Signed)
Heidi Whitaker states she also called Cardiology [facility] and they called [name] back. They increased her Lasix to 40 mg. States they will be going to check a BMET on the Patient today. States they may need some help in the interm managing the Patient as she switches from seeing Dr [name] to seeing Dr [name] to keep the Patient out of the hospital Sees Dr [name] in 2 weeks.

## 2022-01-14 ENCOUNTER — Other Ambulatory Visit: Payer: Self-pay | Admitting: Internal Medicine

## 2022-01-14 ENCOUNTER — Telehealth: Payer: Self-pay | Admitting: Family

## 2022-01-14 NOTE — Telephone Encounter (Signed)
Labs received from [facility] dated 01/12/22  Sodium 138 Potassium 4.4 BUN 20 Creatinine 1.28 eGFR 40  Continue medications at this time.

## 2022-01-15 ENCOUNTER — Telehealth (HOSPITAL_COMMUNITY): Payer: Self-pay

## 2022-01-15 DIAGNOSIS — E1159 Type 2 diabetes mellitus with other circulatory complications: Secondary | ICD-10-CM | POA: Diagnosis not present

## 2022-01-15 DIAGNOSIS — E559 Vitamin D deficiency, unspecified: Secondary | ICD-10-CM | POA: Diagnosis not present

## 2022-01-15 DIAGNOSIS — I4891 Unspecified atrial fibrillation: Secondary | ICD-10-CM | POA: Diagnosis not present

## 2022-01-15 DIAGNOSIS — I5023 Acute on chronic systolic (congestive) heart failure: Secondary | ICD-10-CM | POA: Diagnosis not present

## 2022-01-15 DIAGNOSIS — N1831 Chronic kidney disease, stage 3a: Secondary | ICD-10-CM | POA: Diagnosis not present

## 2022-01-15 DIAGNOSIS — J9601 Acute respiratory failure with hypoxia: Secondary | ICD-10-CM | POA: Diagnosis not present

## 2022-01-15 DIAGNOSIS — E1151 Type 2 diabetes mellitus with diabetic peripheral angiopathy without gangrene: Secondary | ICD-10-CM | POA: Diagnosis not present

## 2022-01-15 DIAGNOSIS — E1122 Type 2 diabetes mellitus with diabetic chronic kidney disease: Secondary | ICD-10-CM | POA: Diagnosis not present

## 2022-01-15 DIAGNOSIS — I700 Atherosclerosis of aorta: Secondary | ICD-10-CM | POA: Diagnosis not present

## 2022-01-15 DIAGNOSIS — I152 Hypertension secondary to endocrine disorders: Secondary | ICD-10-CM | POA: Diagnosis not present

## 2022-01-15 NOTE — Telephone Encounter (Signed)
Spoke with Floris's daughter and she advised that Tamera is very weak, making noises when she sleeps, not wanting to drink the required 50 ounces.  Mouthe is dry.  She states she did 20mg  torsemide on Friday due to eye appts when they got home and she gained 3 lbs on Saturday.  She had some edema in ankles but took 40  mg that day.  She lost it on Sunday but the Nurse that came out told her to continue taking 40 mg everyday and she drew some labs on Monday.  Reading through the notes from HF clinic and doctor if stable to continue the regiman she was on.  She has been doing 40 mg everyday this week.  She is starting tomorrow back on 20mg  and 40 mg everyother day.  She has cardiology appt next week.  Will make a home visit next week.   [place] [phone]

## 2022-01-16 ENCOUNTER — Ambulatory Visit: Payer: Medicare HMO | Admitting: Cardiovascular Disease

## 2022-01-16 ENCOUNTER — Other Ambulatory Visit: Payer: Self-pay

## 2022-01-16 VITALS — BP 108/78 | HR 124 | Wt 178.0 lb

## 2022-01-16 DIAGNOSIS — I10 Essential (primary) hypertension: Secondary | ICD-10-CM

## 2022-01-16 DIAGNOSIS — E785 Hyperlipidemia, unspecified: Secondary | ICD-10-CM

## 2022-01-16 DIAGNOSIS — I4819 Other persistent atrial fibrillation: Secondary | ICD-10-CM

## 2022-01-16 DIAGNOSIS — N1831 Chronic kidney disease, stage 3a: Secondary | ICD-10-CM

## 2022-01-16 DIAGNOSIS — E1122 Type 2 diabetes mellitus with diabetic chronic kidney disease: Secondary | ICD-10-CM | POA: Diagnosis not present

## 2022-01-16 DIAGNOSIS — I739 Peripheral vascular disease, unspecified: Secondary | ICD-10-CM | POA: Diagnosis not present

## 2022-01-16 DIAGNOSIS — I70229 Atherosclerosis of native arteries of extremities with rest pain, unspecified extremity: Secondary | ICD-10-CM | POA: Diagnosis not present

## 2022-01-16 DIAGNOSIS — I5022 Chronic systolic (congestive) heart failure: Secondary | ICD-10-CM

## 2022-01-16 MED ORDER — DIGOXIN 125 MCG PO TABS: 0.1250 mg | ORAL_TABLET | Freq: Every day | ORAL | 6 refills | Status: DC

## 2022-01-16 MED ORDER — APIXABAN 5 MG PO TABS: 5.0000 mg | ORAL_TABLET | Freq: Two times a day (BID) | ORAL | 6 refills | Status: DC

## 2022-01-16 NOTE — Progress Notes (Signed)
Cardiology Office Note  Date:  01/16/2022   ID:  Heidi Whitaker, DOB [birth year], MRN [id number]  PCP:  McLean-Scocuzza, Nino Glow, MD   Chief Complaint  Patient presents with   NEW patient-A Fib (self referral)    HPI:  88 year old woman with medical history significant for  CHF with EF of 45 to 50%,  hypertension,  hyperlipidemia,  type 2 diabetes mellitus,  stroke,  DVT, not on anticoagulation,  hard of hearing,  chronic kidney disease stage III  PAD, AAA,  chronic diarrhea,  atrial fibrillation not on anticoagulation,  status post colostomy due to 2 benign tumors per her daughter.   Who presents to establish care in the [facility] office for her atrial fibrillation  Previously [facility] History reviewed In the hospital November 2022 with symptoms of shortness of breath and weakness,  A. fib with rapid ventricular rate of 160  desaturating in the 88% on room air , 92 to 95% with 2 L/min.  Previous echocardiogram May 2022 EF 45 to 50%.   Repeat echocardiogram EF worsened at 35-40% Started on IV diuretic, has been responding appropriately  Started on diltiazem and metoprolol for rate control    frequently noncompliant with her scheduled diuretics and difficult to control her salt intake.    At the time of discharge from the hospital was placed on as needed Lasix and monitor weight on a daily basis.    Daughter reports Sleeping more, poor appetitie, sedentary, sleepig in day SOB with walking Edema in ankles,   Reports her diuretic dose has been up and down recently guided by CHF clinic, primary care Lasix 20 , up to 40 milligrams Then 20/40 alternating every other day Then back on 40 daily Then 20/40 alternating every other day  Lab work reviewed Cr 1.28  Hx of nose bleeds  EKG personally reviewed by myself on todays visit Atrial fibrillation ventricular rate 124 bpm poor R wave progression, unable to exclude old anterior MI, left axis  deviation  PMH:   has a past medical history of Arrhythmia, CHF (congestive heart failure) ([place]), Complication of anesthesia, Diabetes mellitus without complication ([place]), DVT (deep venous thrombosis) ([place]), Hard of hearing, Hypertension, and PAD (peripheral artery disease) ([place]).  PSH:    Past Surgical History:  Procedure Laterality Date   CESAREAN SECTION     COLONOSCOPY N/A 05/17/2019   Procedure: FLEXIBLE SIGMOIDOSCOPY;  Surgeon: Ileana Roup, MD;  Location: [facility] ORS;  Service: General;  Laterality: N/A;   COLONOSCOPY WITH PROPOFOL N/A 01/09/2019   Procedure: COLONOSCOPY WITH PROPOFOL;  Surgeon: Lin Landsman, MD;  Location: [facility] ENDOSCOPY;  Service: Gastroenterology;  Laterality: N/A;   ESOPHAGOGASTRODUODENOSCOPY (EGD) WITH PROPOFOL N/A 01/09/2019   Procedure: ESOPHAGOGASTRODUODENOSCOPY (EGD) WITH PROPOFOL;  Surgeon: Lin Landsman, MD;  Location: [facility] ENDOSCOPY;  Service: Gastroenterology;  Laterality: N/A;   KNEE SURGERY     left 2017/2018   LOWER EXTREMITY ANGIOGRAPHY Left 05/29/2021   Procedure: LOWER EXTREMITY ANGIOGRAPHY;  Surgeon: Algernon Huxley, MD;  Location: [place] CV LAB;  Service: Cardiovascular;  Laterality: Left;   LOWER EXTREMITY ANGIOGRAPHY Right 06/05/2021   Procedure: LOWER EXTREMITY ANGIOGRAPHY;  Surgeon: Algernon Huxley, MD;  Location: [place] CV LAB;  Service: Cardiovascular;  Laterality: Right;   SHOULDER SURGERY     right   XI ROBOTIC ASSISTED LOWER ANTERIOR RESECTION N/A 05/17/2019   Procedure: XI ROBOTIC ASSISTED LOWER ANTERIOR RESECTION WITH END COLOSTOMY;  Surgeon: Ileana Roup, MD;  Location: [facility];  Service: General;  Laterality: N/A;    Current Outpatient Medications  Medication Sig Dispense Refill   apixaban (ELIQUIS) 5 MG TABS tablet Take 1 tablet (5 mg total) by mouth 2 (two) times daily. 60 tablet 6   Cholecalciferol (D3-1000) 25 MCG (1000 UT) capsule Take 2,000 Units by mouth daily.     digoxin (LANOXIN) 0.125 MG tablet  Take 1 tablet (0.125 mg total) by mouth daily. 30 tablet 6   furosemide (LASIX) 40 MG tablet Take by mouth daily. Alternates 40mg  and 20mg  QOD     glimepiride (AMARYL) 2 MG tablet TAKE 1 TABLET DAILY WITH   BREAKFAST 90 tablet 3   lovastatin (MEVACOR) 20 MG tablet TAKE 1 TABLET DAILY AT 6PM 90 tablet 3   metoprolol succinate (TOPROL-XL) 50 MG 24 hr tablet Take 1 tablet (50 mg total) by mouth daily. D/c 25 and 100 mg 90 tablet 3   potassium chloride (KLOR-CON M) 10 MEQ tablet Take two tablets (20 MEQ) by mouth twice a day. 360 tablet 1   No current facility-administered medications for this visit.     Allergies:   Patient has no known allergies.   Social History:  The patient  reports that she has quit smoking. Her smoking use included cigarettes. She has never used smokeless tobacco. She reports that she does not drink alcohol and does not use drugs.   Family History:   family history includes Addison's disease in her maternal aunt; Heart disease in her father and mother.    Review of Systems: ROS   PHYSICAL EXAM: VS:  BP 108/78 (BP Location: Left Arm, Patient Position: Sitting, Cuff Size: Large)    Pulse (!) 124    Wt 178 lb (80.7 kg)    SpO2 98%    BMI 30.55 kg/m  , BMI Body mass index is 30.55 kg/m. GEN: Well nourished, well developed, in no acute distress HEENT: normal Neck: no JVD, carotid bruits, or masses Cardiac: RRR; no murmurs, rubs, or gallops,no edema  Respiratory:  clear to auscultation bilaterally, normal work of breathing GI: soft, nontender, nondistended, + BS MS: no deformity or atrophy Skin: warm and dry, no rash Neuro:  Strength and sensation are intact Psych: euthymic mood, full affect    Recent Labs: 05/14/2021: TSH 3.788 10/31/2021: ALT 10; B Natriuretic Peptide 355.8; Magnesium 2.5 11/03/2021: Hemoglobin 13.2; Platelets 306 01/01/2022: BUN 25; Creatinine, Ser 1.17; Potassium 4.1; Sodium 135    Lipid Panel Lab Results  Component Value Date   CHOL 141  11/27/2020   HDL 43.80 11/27/2020   LDLCALC 78 11/27/2020   TRIG 98.0 11/27/2020      Wt Readings from Last 3 Encounters:  01/16/22 178 lb (80.7 kg)  01/05/22 178 lb (80.7 kg)  12/31/21 178 lb (80.7 kg)       ASSESSMENT AND PLAN:  Problem List Items Addressed This Visit       Cardiology Problems   Chronic systolic heart failure (HCC)   Relevant Medications   furosemide (LASIX) 40 MG tablet   apixaban (ELIQUIS) 5 MG TABS tablet   digoxin (LANOXIN) 0.125 MG tablet   Other Relevant Orders   EKG 12-Lead   HTN (hypertension)   Relevant Medications   furosemide (LASIX) 40 MG tablet   apixaban (ELIQUIS) 5 MG TABS tablet   digoxin (LANOXIN) 0.125 MG tablet   Hyperlipidemia   Relevant Medications   furosemide (LASIX) 40 MG tablet   apixaban (ELIQUIS) 5 MG TABS tablet   digoxin (LANOXIN)  0.125 MG tablet   PAD (peripheral artery disease) (HCC)   Relevant Medications   furosemide (LASIX) 40 MG tablet   apixaban (ELIQUIS) 5 MG TABS tablet   digoxin (LANOXIN) 0.125 MG tablet     Other   Type II diabetes mellitus with renal manifestations (HCC)   Other Visit Diagnoses     Persistent atrial fibrillation (HCC)    -  Primary   Relevant Medications   furosemide (LASIX) 40 MG tablet   apixaban (ELIQUIS) 5 MG TABS tablet   digoxin (LANOXIN) 0.125 MG tablet   Atherosclerosis of artery of extremity with rest pain (HCC)       Relevant Medications   furosemide (LASIX) 40 MG tablet   apixaban (ELIQUIS) 5 MG TABS tablet   digoxin (LANOXIN) 0.125 MG tablet   Other Relevant Orders   EKG 12-Lead       Persistent atrial fibrillation First noted November 2022, evaluated in the hospital at that time No attempt made to restore normal sinus rhythm, low ejection fraction noted Was not started on anticoagulation After long discussion, patient and daughter willing to try anticoagulation, very fearful of strokes We will stop the Plavix, start Eliquis 5 twice daily Poorly controlled  rate, given low blood pressure we will start digoxin 0.25 mg x 1 today then 0.125 daily Not clear if they would like to proceed with cardioversion though this was discussed Not a candidate for cardioversion for at least 1 month.  Having symptoms of shortness of breath, fatigue.  Fluid status managed with Lasix daily Will continue Lasix 40 alternating with 20 every other day Stable renal function  Dilated cardiomyopathy Unable to exclude ischemia, currently not having anginal symptoms No ischemic work-up at this time, patient prefers no significant testing Will start digoxin, continue Lasix, on metoprolol succinate 50 daily Will discuss additional medications and follow-up, could consider spironolactone, Farxiga/Jardiance  Chronic diastolic and systolic CHF Appears relatively euvolemic, continue Lasix 40/20 every other day   Total encounter time more than 60 minutes  Greater than 50% was spent in counseling and coordination of care with the patient    Signed, Esmond Plants, M.D., Ph.D. [place], [place]

## 2022-01-16 NOTE — Patient Instructions (Addendum)
Medication Instructions:  - Your physician has recommended you make the following change in your medication:   1) STOP plavix  2) START eliquis 5 mg: - take 1 tablet by mouth twice a day  3) START digoxin 0.125 mg: - Day 1 take 2 tablets ( 0.25 mg) x 1 dose, then - take 1 tablet  (0.125 mg) by mouth once daily    Samples Given:  Eliquis 5 mg Lot: [id number] Exp: 08/2023 # 3 boxes  If you need a refill on your cardiac medications before your next appointment, please call your pharmacy.   Lab work: No new labs needed  Testing/Procedures: No new testing needed  Follow-Up: At [facility], you and your health needs are our priority.  As part of our continuing mission to provide you with exceptional heart care, we have created designated Provider Care Teams.  These Care Teams include your primary Cardiologist (physician) and Advanced Practice Providers (APPs -  Physician Assistants and Nurse Practitioners) who all work together to provide you with the care you need, when you need it.  You will need a follow up appointment in 1 month  Providers on your designated Care Team:   Murray Hodgkins, NP Christell Faith, PA-C Cadence Kathlen Mody, [place]  COVID-19 Vaccine Information can be found at: [email or web address].uk For questions related to vaccine distribution or appointments, please email [email or web address] or call [phone].

## 2022-01-19 ENCOUNTER — Other Ambulatory Visit (HOSPITAL_COMMUNITY): Payer: Self-pay

## 2022-01-19 ENCOUNTER — Ambulatory Visit: Payer: Medicare HMO | Admitting: Family

## 2022-01-19 ENCOUNTER — Encounter (HOSPITAL_COMMUNITY): Payer: Self-pay

## 2022-01-19 NOTE — Progress Notes (Signed)
Today had a home visit with Heidi Whitaker.  She seen cardiology on Friday.  He added digoxin and eliquis to her meds.  She is to stop the plavix.  She took 2 digoxin on Saturday and now she is taking 1 a day. She is aware to take eliquis twice a day and to take with food. Her daughter states she has noticed her breathing has been better yesterday and today.  She states she is not coughing like she was either.  She states she does not have as much energy but feels good.  Heart rate is much more regular and strong.  Pule today was 74 and irregular.  Vitals were good and lungs are clear.  She has no complaints of chest pain or dizziness.  Edema in lower legs is plus1, about her normal.  Daughter states she is weighing daily, she noticed when she does the 20 mg furosemide she may gain a lbs but loses when she does the 40 mg the next day.  Heidi Whitaker likes this regiman and would like to continue it.  She is aware to take 40 mg if she needs to.  She is not a big drinker but when we add up her milk, juice and ice cream she is getting closer to the 50 ozs a day. She is not a big eater, they eat 2 meals a day then she snacks in the evening. Their big meal is about 2 pm. She has palliative NP and nurse then she has Nurse with [facility].  Will continue to visit for heart failure, diet and medication management.   [place] [phone]

## 2022-01-20 ENCOUNTER — Telehealth: Payer: Self-pay

## 2022-01-20 NOTE — Telephone Encounter (Signed)
Pt daughter is returning call 

## 2022-01-20 NOTE — Telephone Encounter (Signed)
LMTCB in regards to lab results.  

## 2022-01-21 NOTE — Telephone Encounter (Signed)
[name] Reather Laurence informed and verbalized understanding.

## 2022-01-22 ENCOUNTER — Other Ambulatory Visit: Payer: Medicare HMO | Admitting: Nurse Practitioner

## 2022-01-22 ENCOUNTER — Other Ambulatory Visit: Payer: Self-pay

## 2022-01-22 ENCOUNTER — Encounter: Payer: Self-pay | Admitting: Nurse Practitioner

## 2022-01-22 DIAGNOSIS — R0602 Shortness of breath: Secondary | ICD-10-CM | POA: Diagnosis not present

## 2022-01-22 DIAGNOSIS — I5022 Chronic systolic (congestive) heart failure: Secondary | ICD-10-CM | POA: Diagnosis not present

## 2022-01-22 DIAGNOSIS — R5381 Other malaise: Secondary | ICD-10-CM

## 2022-01-22 NOTE — Progress Notes (Signed)
[facility] Palliative Care Consult Note Telephone: [phone]  Fax: [phone]    Date of encounter: 01/22/22 7:12 PM PATIENT NAME: Heidi Whitaker [address] [place] [address]   [phone] (home)  DOB: 02/22/1929 MRN: [id number] PRIMARY CARE PROVIDER:    McLean-Scocuzza, Nino Glow, MD,  [place] [address] [phone]  RESPONSIBLE PARTY:    Contact Information     Name Relation Home Work [address]   Reather Laurence Daughter [phone]  [phone]   [facility] Relative [phone]        I met face to face with patient and family in home. Palliative Care was asked to follow this patient by consultation request of  McLean-Scocuzza, Olivia Mackie * to address advance care planning and complex medical decision making. This is a follow up visit.                                  ASSESSMENT AND PLAN / RECOMMENDATIONS:  Symptom Management/Plan: 1. Advance Care Planning; Discussed code status, made DNR, goldenrod form completed; discussed most form, blank most form left in home for further discussion amongst family   2. Shortness of breath secondary to CHF, continue to monitor weights; edema. Discussed keeping legs elevated as compression hose is difficult for Heidi Whitaker to put on and off. I discussed at length about diet, ras. We talked about CHF clinic. We talked about community paramedic program, will see if she would be eligible. Discussed varied hr from sustaining 120-130's to 50's. Discussed holter monitor. Will f/u with Darylene Price NP CHF clinic to see if this is an option as Heidi Whitaker is in agreement to wear.    3. Debility secondary to CHF, discussed at challenges with shortness of breath walking up the stairs. We talked about energy conservation. We talked about Heidi Whitaker feeling better since started on digoxin. We talked about medication, education completed.    4. Goals of Care: Goals include to maximize  quality of life and symptom management. Our advance care planning conversation included a discussion about:    The value and importance of advance care planning  Exploration of personal, cultural or spiritual beliefs that might influence medical decisions  Exploration of goals of care in the event of a sudden injury or illness  Identification and preparation of a healthcare agent  Review and updating or creation of an advance directive document.   5. Palliative care encounter; Palliative care encounter; Palliative medicine team will continue to support patient, patient's family, and medical team. Visit consisted of counseling and education dealing with the complex and emotionally intense issues of symptom management and palliative care in the setting of serious and potentially life-threatening illness   6. f/u 2 month for ongoing monitoring chronic disease progression, ongoing discussions complex medical decision making  Follow up Palliative Care Visit: Palliative care will continue to follow for complex medical decision making, advance care planning, and clarification of goals. Return 8 weeks or prn.  I spent 61 minutes providing this consultation. More than 50% of the time in this consultation was spent in counseling and care coordination.  PPS: 50%  Chief Complaint: Follow up palliative consult for complex medical decision making  HISTORY OF PRESENT ILLNESS:  CHERAL CAPPUCCI is a 88 y.o. year old female  with multiple medial problems including CHF, Afib, h/o DVT, HOH, PAD, HTN, DM. I called Heidi Whitaker, Heidi Whitaker to confirm  PC initial visit. Heidi Whitaker in agreement with covid screen negative. I visited Heidi Whitaker and Heidi Whitaker in their home. We talked about last PC visit, symptoms, ros, cardiology visit including starting digoxin, how she has been feeling. We talked about appetite, nutrition. We talked about sleep pattern. We talked about quality of life, napping during the day, daily routine.  We talked about medical goals. We talked about role pc in poc. We talked about community paramedic program visited weekly as well as Therapist, sports. F/u visit scheduled. Therapeutic listening, emotional support provided. Questions answered.   History obtained from review of EMR, discussion with Heidi Whitaker and Heidi Whitaker.  I reviewed available labs, medications, imaging, studies and related documents from the EMR.  Records reviewed and summarized above.   ROS 10 point system reviewed with Heidi Whitaker, daughter and Heidi Whitaker all negative except HPI  Physical Exam: Constitutional: NAD General: frail appearing, elderly pleasant female EYES: lids intact, ENMT: oral mucous membranes moist CV: S1S2, RRR Pulmonary: LCTA, no increased work of breathing, no cough, room air Abdomen: soft and non tender, MSK: ambulatory Skin: warm and dry Neuro:  + generalized weakness,  no cognitive impairment Psych: non-anxious affect, A and O x 3  Thank you for the opportunity to participate in the care of Heidi Whitaker.  The palliative care team will continue to follow. Please call our office at [phone] if we can be of additional assistance.   Questions and concerns were addressed. The patient/family was encouraged to call with questions and/or concerns. My contact information was provided. Provided general support and encouragement, no other unmet needs identified   This chart was dictated using voice recognition software.  Despite best efforts to proofread,  errors can occur which can change the documentation meaning.   Davan Nawabi Z Harlie Buening, NP   COVID-19 PATIENT SCREENING TOOL Asked and negative response unless otherwise noted:   Have you had symptoms of covid, tested positive or been in contact with someone with symptoms/positive test in the past 5-10 days? NO

## 2022-01-23 ENCOUNTER — Ambulatory Visit: Payer: Medicare HMO | Admitting: Cardiovascular Disease

## 2022-01-29 DIAGNOSIS — I152 Hypertension secondary to endocrine disorders: Secondary | ICD-10-CM | POA: Diagnosis not present

## 2022-01-29 DIAGNOSIS — E1122 Type 2 diabetes mellitus with diabetic chronic kidney disease: Secondary | ICD-10-CM | POA: Diagnosis not present

## 2022-01-29 DIAGNOSIS — E559 Vitamin D deficiency, unspecified: Secondary | ICD-10-CM | POA: Diagnosis not present

## 2022-01-29 DIAGNOSIS — N1831 Chronic kidney disease, stage 3a: Secondary | ICD-10-CM | POA: Diagnosis not present

## 2022-01-29 DIAGNOSIS — K56609 Unspecified intestinal obstruction, unspecified as to partial versus complete obstruction: Secondary | ICD-10-CM | POA: Diagnosis not present

## 2022-01-29 DIAGNOSIS — Z933 Colostomy status: Secondary | ICD-10-CM | POA: Diagnosis not present

## 2022-01-29 DIAGNOSIS — I4891 Unspecified atrial fibrillation: Secondary | ICD-10-CM | POA: Diagnosis not present

## 2022-01-29 DIAGNOSIS — E1159 Type 2 diabetes mellitus with other circulatory complications: Secondary | ICD-10-CM | POA: Diagnosis not present

## 2022-01-29 DIAGNOSIS — I5023 Acute on chronic systolic (congestive) heart failure: Secondary | ICD-10-CM | POA: Diagnosis not present

## 2022-01-29 DIAGNOSIS — E1151 Type 2 diabetes mellitus with diabetic peripheral angiopathy without gangrene: Secondary | ICD-10-CM | POA: Diagnosis not present

## 2022-01-29 DIAGNOSIS — J9601 Acute respiratory failure with hypoxia: Secondary | ICD-10-CM | POA: Diagnosis not present

## 2022-01-29 DIAGNOSIS — I700 Atherosclerosis of aorta: Secondary | ICD-10-CM | POA: Diagnosis not present

## 2022-02-05 DIAGNOSIS — E559 Vitamin D deficiency, unspecified: Secondary | ICD-10-CM | POA: Diagnosis not present

## 2022-02-05 DIAGNOSIS — E1151 Type 2 diabetes mellitus with diabetic peripheral angiopathy without gangrene: Secondary | ICD-10-CM | POA: Diagnosis not present

## 2022-02-05 DIAGNOSIS — J9601 Acute respiratory failure with hypoxia: Secondary | ICD-10-CM | POA: Diagnosis not present

## 2022-02-05 DIAGNOSIS — E1159 Type 2 diabetes mellitus with other circulatory complications: Secondary | ICD-10-CM | POA: Diagnosis not present

## 2022-02-05 DIAGNOSIS — I152 Hypertension secondary to endocrine disorders: Secondary | ICD-10-CM | POA: Diagnosis not present

## 2022-02-05 DIAGNOSIS — I700 Atherosclerosis of aorta: Secondary | ICD-10-CM | POA: Diagnosis not present

## 2022-02-05 DIAGNOSIS — E1122 Type 2 diabetes mellitus with diabetic chronic kidney disease: Secondary | ICD-10-CM | POA: Diagnosis not present

## 2022-02-05 DIAGNOSIS — I5023 Acute on chronic systolic (congestive) heart failure: Secondary | ICD-10-CM | POA: Diagnosis not present

## 2022-02-05 DIAGNOSIS — I4891 Unspecified atrial fibrillation: Secondary | ICD-10-CM | POA: Diagnosis not present

## 2022-02-05 DIAGNOSIS — N1831 Chronic kidney disease, stage 3a: Secondary | ICD-10-CM | POA: Diagnosis not present

## 2022-02-09 ENCOUNTER — Other Ambulatory Visit (HOSPITAL_COMMUNITY): Payer: Self-pay

## 2022-02-09 ENCOUNTER — Other Ambulatory Visit: Payer: Self-pay

## 2022-02-09 MED ORDER — APIXABAN 5 MG PO TABS: 5.0000 mg | ORAL_TABLET | Freq: Two times a day (BID) | ORAL | 3 refills | Status: DC

## 2022-02-11 ENCOUNTER — Encounter (HOSPITAL_COMMUNITY): Payer: Self-pay

## 2022-02-11 NOTE — Progress Notes (Signed)
Had a home visit with Heidi Whitaker and her daughter.  Her daughter states the dementia is getting worse.  Heidi Whitaker states she feels well today.  Her daughter thought she was a little short of breath earlier today.  Lungs are clear.  Her daughter does her medications.  Her weight fluctuates 1 lb back and forth but no large increase in weight or decrease.  Right ankle area has some edema but none pitting, left has none.  She stays active around the home.  She takes several naps during the day.  She is still able to walk up and down the steps to her room once a day.  She denies any pain or problems.  She still having a hard time drinking enough water through the day.  Have explained several times importance of fluids.  Appetite is same, she eats smalls meals.  She does not snack as much.  She has been walking her dog around the [place] they [place].  She denies any chest pain, headache, dizziness or increased shortness of breath.  She has cardiology appt coming up this month and they are aware.  Heart rate is mor controlled since started the digoxin.   Her breathing has improved also with exertion.  Her daughter wants her prescription to be sent to [facility], advised Dr Donivan Scull office.  Will continue to visit for heart failure, diet and medication management.   [place] [phone]

## 2022-02-12 DIAGNOSIS — I700 Atherosclerosis of aorta: Secondary | ICD-10-CM | POA: Diagnosis not present

## 2022-02-12 DIAGNOSIS — E1151 Type 2 diabetes mellitus with diabetic peripheral angiopathy without gangrene: Secondary | ICD-10-CM | POA: Diagnosis not present

## 2022-02-12 DIAGNOSIS — E1122 Type 2 diabetes mellitus with diabetic chronic kidney disease: Secondary | ICD-10-CM | POA: Diagnosis not present

## 2022-02-12 DIAGNOSIS — N1831 Chronic kidney disease, stage 3a: Secondary | ICD-10-CM | POA: Diagnosis not present

## 2022-02-12 DIAGNOSIS — E559 Vitamin D deficiency, unspecified: Secondary | ICD-10-CM | POA: Diagnosis not present

## 2022-02-12 DIAGNOSIS — J9601 Acute respiratory failure with hypoxia: Secondary | ICD-10-CM | POA: Diagnosis not present

## 2022-02-12 DIAGNOSIS — I5023 Acute on chronic systolic (congestive) heart failure: Secondary | ICD-10-CM | POA: Diagnosis not present

## 2022-02-12 DIAGNOSIS — I152 Hypertension secondary to endocrine disorders: Secondary | ICD-10-CM | POA: Diagnosis not present

## 2022-02-12 DIAGNOSIS — E1159 Type 2 diabetes mellitus with other circulatory complications: Secondary | ICD-10-CM | POA: Diagnosis not present

## 2022-02-12 DIAGNOSIS — I4891 Unspecified atrial fibrillation: Secondary | ICD-10-CM | POA: Diagnosis not present

## 2022-02-16 ENCOUNTER — Ambulatory Visit: Payer: Medicare HMO | Admitting: Family

## 2022-02-19 DIAGNOSIS — E1159 Type 2 diabetes mellitus with other circulatory complications: Secondary | ICD-10-CM | POA: Diagnosis not present

## 2022-02-19 DIAGNOSIS — E559 Vitamin D deficiency, unspecified: Secondary | ICD-10-CM | POA: Diagnosis not present

## 2022-02-19 DIAGNOSIS — I5023 Acute on chronic systolic (congestive) heart failure: Secondary | ICD-10-CM | POA: Diagnosis not present

## 2022-02-19 DIAGNOSIS — I152 Hypertension secondary to endocrine disorders: Secondary | ICD-10-CM | POA: Diagnosis not present

## 2022-02-19 DIAGNOSIS — I4891 Unspecified atrial fibrillation: Secondary | ICD-10-CM | POA: Diagnosis not present

## 2022-02-19 DIAGNOSIS — E1151 Type 2 diabetes mellitus with diabetic peripheral angiopathy without gangrene: Secondary | ICD-10-CM | POA: Diagnosis not present

## 2022-02-19 DIAGNOSIS — I700 Atherosclerosis of aorta: Secondary | ICD-10-CM | POA: Diagnosis not present

## 2022-02-19 DIAGNOSIS — N1831 Chronic kidney disease, stage 3a: Secondary | ICD-10-CM | POA: Diagnosis not present

## 2022-02-19 DIAGNOSIS — E1122 Type 2 diabetes mellitus with diabetic chronic kidney disease: Secondary | ICD-10-CM | POA: Diagnosis not present

## 2022-02-19 DIAGNOSIS — J9601 Acute respiratory failure with hypoxia: Secondary | ICD-10-CM | POA: Diagnosis not present

## 2022-02-24 ENCOUNTER — Ambulatory Visit: Payer: Medicare HMO | Admitting: Cardiovascular Disease

## 2022-02-24 ENCOUNTER — Encounter: Payer: Self-pay | Admitting: Cardiovascular Disease

## 2022-02-24 ENCOUNTER — Other Ambulatory Visit: Payer: Self-pay

## 2022-02-24 VITALS — BP 128/68 | HR 97 | Ht 64.0 in | Wt 178.0 lb

## 2022-02-24 DIAGNOSIS — I10 Essential (primary) hypertension: Secondary | ICD-10-CM

## 2022-02-24 DIAGNOSIS — E1122 Type 2 diabetes mellitus with diabetic chronic kidney disease: Secondary | ICD-10-CM | POA: Diagnosis not present

## 2022-02-24 DIAGNOSIS — I4819 Other persistent atrial fibrillation: Secondary | ICD-10-CM | POA: Diagnosis not present

## 2022-02-24 DIAGNOSIS — N1831 Chronic kidney disease, stage 3a: Secondary | ICD-10-CM

## 2022-02-24 DIAGNOSIS — I4821 Permanent atrial fibrillation: Secondary | ICD-10-CM

## 2022-02-24 DIAGNOSIS — I70229 Atherosclerosis of native arteries of extremities with rest pain, unspecified extremity: Secondary | ICD-10-CM

## 2022-02-24 DIAGNOSIS — E785 Hyperlipidemia, unspecified: Secondary | ICD-10-CM | POA: Diagnosis not present

## 2022-02-24 DIAGNOSIS — I5022 Chronic systolic (congestive) heart failure: Secondary | ICD-10-CM | POA: Diagnosis not present

## 2022-02-24 DIAGNOSIS — I739 Peripheral vascular disease, unspecified: Secondary | ICD-10-CM | POA: Diagnosis not present

## 2022-02-24 MED ORDER — METOPROLOL SUCCINATE ER 50 MG PO TB24: ORAL_TABLET | ORAL | 1 refills | Status: DC

## 2022-02-24 NOTE — Patient Instructions (Addendum)
Patient assistance for Eliquis  Check price of pradaxa in the future with pharmacy (different blood thinner)  Medication Instructions:  Please increase the metoprolol succinate 50 mg in the AM, 25 mg in the evening  If you need a refill on your cardiac medications before your next appointment, please call your pharmacy.   Lab work: BMP, digoxin  today  Testing/Procedures: No new testing needed  Follow-Up: At [facility], you and your health needs are our priority.  As part of our continuing mission to provide you with exceptional heart care, we have created designated Provider Care Teams.  These Care Teams include your primary Cardiologist (physician) and Advanced Practice Providers (APPs -  Physician Assistants and Nurse Practitioners) who all work together to provide you with the care you need, when you need it.  You will need a follow up appointment in 6 months  Providers on your designated Care Team:   Murray Hodgkins, NP Christell Faith, PA-C Cadence Kathlen Mody, [place]  COVID-19 Vaccine Information can be found at: [email or web address].uk For questions related to vaccine distribution or appointments, please email [email or web address] or call [phone].

## 2022-02-24 NOTE — Progress Notes (Signed)
Cardiology Office Note  Date:  02/24/2022   ID:  Heidi Whitaker, DOB [birth year], MRN [id number]  PCP:  McLean-Scocuzza, Nino Glow, MD   Chief Complaint  Patient presents with   1 month follow up     Patient c/o LE edema & shortness of breath with little activity. Medications reviewed by the patient verbally.     HPI:  88 year old woman with medical history significant for  CHF with EF of 45 to 50%,  hypertension,  hyperlipidemia,  type 2 diabetes mellitus,  stroke,  DVT, not on anticoagulation,  hard of hearing,  chronic kidney disease stage III  PAD, AAA,  chronic diarrhea,  atrial fibrillation not on anticoagulation,  status post colostomy due to 2 benign tumors per her daughter.   Who presents for follow-up of her atrial fibrillation  Last office visit January 16, 2022 First noted November 2022, evaluated in the hospital at that time No attempt made to restore normal sinus rhythm, low ejection fraction noted Was not started on anticoagulation On her visit, Plavix held started on Eliquis 5 twice daily Started on low-dose digoxin for rate control We did discuss cardioversion Lasix 20 alternating with 40 every other day Preferred no ischemic work-up for cardiomyopathy  Has underlying dementia Notes indicating she is DNR  Ejection fraction 35 to 40%  Presents with her daughter Further discussion today, she does not want It was very expensive Still feels palpitations at times Occasional ankle swelling Denies significant shortness of breath Lasix 40 alternating with 20  EKG personally reviewed by myself on todays visit Atrial fibrillation ventricular rate 97 bpm consider old anterior MI/[place] intraventricular conduction delay  Reviewed In the hospital November 2022 with symptoms of shortness of breath and weakness,  A. fib with rapid ventricular rate of 160  desaturating in the 88% on room air , 92 to 95% with 2 L/min.  Previous echocardiogram May 2022 EF 45  to 50%.   Repeat echocardiogram EF worsened at 35-40% Started on IV diuretic, has been responding appropriately   PMH:   has a past medical history of Arrhythmia, CHF (congestive heart failure) ([place]), Complication of anesthesia, Diabetes mellitus without complication ([place]), DVT (deep venous thrombosis) ([place]), Hard of hearing, Hypertension, and PAD (peripheral artery disease) ([place]).  PSH:    Past Surgical History:  Procedure Laterality Date   CESAREAN SECTION     COLONOSCOPY N/A 05/17/2019   Procedure: FLEXIBLE SIGMOIDOSCOPY;  Surgeon: Ileana Roup, MD;  Location: [facility];  Service: General;  Laterality: N/A;   COLONOSCOPY WITH PROPOFOL N/A 01/09/2019   Procedure: COLONOSCOPY WITH PROPOFOL;  Surgeon: Lin Landsman, MD;  Location: [facility] ENDOSCOPY;  Service: Gastroenterology;  Laterality: N/A;   ESOPHAGOGASTRODUODENOSCOPY (EGD) WITH PROPOFOL N/A 01/09/2019   Procedure: ESOPHAGOGASTRODUODENOSCOPY (EGD) WITH PROPOFOL;  Surgeon: Lin Landsman, MD;  Location: [facility] ENDOSCOPY;  Service: Gastroenterology;  Laterality: N/A;   KNEE SURGERY     left 2017/2018   LOWER EXTREMITY ANGIOGRAPHY Left 05/29/2021   Procedure: LOWER EXTREMITY ANGIOGRAPHY;  Surgeon: Algernon Huxley, MD;  Location: [place] [facility];  Service: Cardiovascular;  Laterality: Left;   LOWER EXTREMITY ANGIOGRAPHY Right 06/05/2021   Procedure: LOWER EXTREMITY ANGIOGRAPHY;  Surgeon: Algernon Huxley, MD;  Location: [place] [facility];  Service: Cardiovascular;  Laterality: Right;   SHOULDER SURGERY     right   XI ROBOTIC ASSISTED LOWER ANTERIOR RESECTION N/A 05/17/2019   Procedure: XI ROBOTIC ASSISTED LOWER ANTERIOR RESECTION WITH END COLOSTOMY;  Surgeon: Ileana Roup,  MD;  Location: [facility];  Service: General;  Laterality: N/A;    Current Outpatient Medications  Medication Sig Dispense Refill   apixaban (ELIQUIS) 5 MG TABS tablet Take 1 tablet (5 mg total) by mouth 2 (two) times daily. 180 tablet 3   Cholecalciferol  (D3-1000) 25 MCG (1000 UT) capsule Take 2,000 Units by mouth daily.     digoxin (LANOXIN) 0.125 MG tablet Take 1 tablet (0.125 mg total) by mouth daily. 30 tablet 6   furosemide (LASIX) 40 MG tablet Take by mouth daily. Alternates 40mg  and 20mg  QOD     glimepiride (AMARYL) 2 MG tablet TAKE 1 TABLET DAILY WITH   BREAKFAST 90 tablet 3   lovastatin (MEVACOR) 20 MG tablet TAKE 1 TABLET DAILY AT 6PM 90 tablet 3   metoprolol succinate (TOPROL-XL) 50 MG 24 hr tablet Take 1 tablet (50 mg total) by mouth daily. D/c 25 and 100 mg 90 tablet 3   potassium chloride (KLOR-CON M) 10 MEQ tablet Take two tablets (20 MEQ) by mouth twice a day. 360 tablet 1   No current facility-administered medications for this visit.    Allergies:   Patient has no known allergies.   Social History:  The patient  reports that she has quit smoking. Her smoking use included cigarettes. She has never used smokeless tobacco. She reports that she does not drink alcohol and does not use drugs.   Family History:   family history includes Addison's disease in her maternal aunt; Heart disease in her father and mother.    Review of Systems: Review of Systems  Constitutional: Negative.   HENT: Negative.    Respiratory: Negative.    Cardiovascular: Negative.   Gastrointestinal: Negative.   Musculoskeletal: Negative.   Neurological: Negative.   Psychiatric/Behavioral: Negative.    All other systems reviewed and are negative.   PHYSICAL EXAM: VS:  BP 128/68 (BP Location: Left Arm, Patient Position: Sitting, Cuff Size: Normal)    Pulse 97    Ht 5\' 4"  (1.626 m)    Wt 178 lb (80.7 kg)    SpO2 97%    BMI 30.55 kg/m  , BMI Body mass index is 30.55 kg/m. GEN: Well nourished, well developed, in no acute distress HEENT: normal Neck: no JVD, carotid bruits, or masses Cardiac: RRR; no murmurs, rubs, or gallops,no edema  Respiratory:  clear to auscultation bilaterally, normal work of breathing GI: soft, nontender, nondistended, +  BS MS: no deformity or atrophy Skin: warm and dry, no rash Neuro:  Strength and sensation are intact Psych: euthymic mood, full affect   Recent Labs: 05/14/2021: TSH 3.788 10/31/2021: ALT 10; B Natriuretic Peptide 355.8; Magnesium 2.5 11/03/2021: Hemoglobin 13.2; Platelets 306 01/01/2022: BUN 25; Creatinine, Ser 1.17; Potassium 4.1; Sodium 135    Lipid Panel Lab Results  Component Value Date   CHOL 141 11/27/2020   HDL 43.80 11/27/2020   LDLCALC 78 11/27/2020   TRIG 98.0 11/27/2020      Wt Readings from Last 3 Encounters:  02/24/22 178 lb (80.7 kg)  02/09/22 177 lb (80.3 kg)  01/19/22 178 lb (80.7 kg)     ASSESSMENT AND PLAN:  Problem List Items Addressed This Visit       Cardiology Problems   Chronic systolic heart failure (HCC)   HTN (hypertension)   Hyperlipidemia   PAD (peripheral artery disease) ([place])     Other   Type II diabetes mellitus with renal manifestations ([place])   Other Visit Diagnoses     Persistent  atrial fibrillation (HCC)    -  Primary   Atherosclerosis of artery of extremity with rest pain (HCC)       Permanent atrial fibrillation (HCC)          Persistent atrial fibrillation For rate control recommend she add additional metoprolol succinate 20 5 in the evening stay on 50 in the morning Stay on Eliquis.  Patient assistance forms provided Stay on Lasix 40 alternating with 20 Stay on digoxin BMP today  Dilated cardiomyopathy Continue digoxin, Lasix, rate control Previously declined ischemic work-up  Chronic diastolic and systolic CHF Appears relatively euvolemic, continue Lasix 40/20 every other day Minimal edema BMP today   Total encounter time more than 40 minutes  Greater than 50% was spent in counseling and coordination of care with the patient    Signed, Esmond Plants, M.D., Ph.D. [place], [place]

## 2022-02-25 LAB — DIGOXIN LEVEL: Digoxin, Serum: 1.4 ng/mL — ABNORMAL HIGH (ref 0.5–0.9)

## 2022-02-27 DIAGNOSIS — K56609 Unspecified intestinal obstruction, unspecified as to partial versus complete obstruction: Secondary | ICD-10-CM | POA: Diagnosis not present

## 2022-02-27 DIAGNOSIS — Z933 Colostomy status: Secondary | ICD-10-CM | POA: Diagnosis not present

## 2022-03-02 ENCOUNTER — Other Ambulatory Visit (HOSPITAL_COMMUNITY): Payer: Self-pay

## 2022-03-02 ENCOUNTER — Telehealth: Payer: Self-pay

## 2022-03-02 ENCOUNTER — Encounter (HOSPITAL_COMMUNITY): Payer: Self-pay

## 2022-03-02 MED ORDER — DIGOXIN 125 MCG PO TABS: 0.0625 mg | ORAL_TABLET | Freq: Every day | ORAL | 3 refills | Status: DC

## 2022-03-02 MED ORDER — DIGOXIN 125 MCG PO TABS: 0.0625 mg | ORAL_TABLET | Freq: Every day | ORAL | 5 refills | Status: DC

## 2022-03-02 NOTE — Progress Notes (Signed)
Today Heidi Whitaker states she is tired but feeling ok.  She states she urinated a lot yesterday with no weight change this morning.  She states drinking enough fluids but when she starts urinating too much she cuts back because she gets tired of running to the bathroom and having accidents.  She lives with daughter and family She takes very good care of her.  She states at night she is waking up a lot due to alzeihmers.  She is getting more confused.  Appetite is still same.  She has been taking all her medications.  She does not like taking medications.  She is looking forward to sitting outside with warmer weather.  She does some simple daily chores in the home.  The daughter has not heard from the blood work and I noticed where Dr [name] is questioning about cutting the digoxin in half, she has not heard from that either. Attempted to contact the office but could not get transferred to the back.  Sent Epic message to doctor and the nurse involved.  Will watch for a return message to let daughter know about what to take.  She has minimal swelling in legs today and lungs were clear.  She states her breathing has been better lately.  She is in afib but more controlled rate since started digoxin.  She has increased her metoprolol like Gollan had mentioned in the last visit.  She has all her medications and her daughter lays them out every morning and night for her.  She has everything for dialy living.  Will continue to visit for heart failure, diet and medication mangement.  ? ?Nurse from [facility] office did notify her of digoxin change and she is aware of how to take it.  Not sure what happen to results of blood work, they are looking it in it, was sent to [place].  ? ?Heidi Whitaker ?[place] EMT_Paramedic ?[phone] ?

## 2022-03-02 NOTE — Telephone Encounter (Signed)
Able to reach pt regarding her recent lab work, Dr. [name] had a chance to review their results and advised  ? ?"Can we recommend that she cut her digoxin dose in half daily  ?I believe they make a lower dose  ?Thx  ?TGollan" ? ?Dig lower to 0.0625 mg daily, updated to [place], otherwise All questions or concerns were address and no additional concerns at this time. Agreeable to plan, will call back for anything further.   ? ?

## 2022-03-03 LAB — SPECIMEN STATUS REPORT

## 2022-03-03 LAB — BASIC METABOLIC PANEL
BUN/Creatinine Ratio: 16 (ref 12–28)
BUN: 18 mg/dL (ref 10–36)
CO2: 20 mmol/L (ref 20–29)
Calcium: 10.1 mg/dL (ref 8.7–10.3)
Chloride: 100 mmol/L (ref 96–106)
Creatinine, Ser: 1.15 mg/dL — ABNORMAL HIGH (ref 0.57–1.00)
Glucose: 233 mg/dL — ABNORMAL HIGH (ref 70–99)
Potassium: 4.6 mmol/L (ref 3.5–5.2)
Sodium: 138 mmol/L (ref 134–144)
eGFR: 45 mL/min/{1.73_m2} — ABNORMAL LOW (ref >59)

## 2022-03-04 NOTE — Addendum Note (Signed)
Addended by: Anselm Pancoast on: 03/04/2022 08:18 AM   Modules accepted: Orders

## 2022-03-05 DIAGNOSIS — N1831 Chronic kidney disease, stage 3a: Secondary | ICD-10-CM | POA: Diagnosis not present

## 2022-03-05 DIAGNOSIS — I4891 Unspecified atrial fibrillation: Secondary | ICD-10-CM | POA: Diagnosis not present

## 2022-03-05 DIAGNOSIS — I152 Hypertension secondary to endocrine disorders: Secondary | ICD-10-CM | POA: Diagnosis not present

## 2022-03-05 DIAGNOSIS — E1151 Type 2 diabetes mellitus with diabetic peripheral angiopathy without gangrene: Secondary | ICD-10-CM | POA: Diagnosis not present

## 2022-03-05 DIAGNOSIS — E1159 Type 2 diabetes mellitus with other circulatory complications: Secondary | ICD-10-CM | POA: Diagnosis not present

## 2022-03-05 DIAGNOSIS — E1122 Type 2 diabetes mellitus with diabetic chronic kidney disease: Secondary | ICD-10-CM | POA: Diagnosis not present

## 2022-03-05 DIAGNOSIS — I700 Atherosclerosis of aorta: Secondary | ICD-10-CM | POA: Diagnosis not present

## 2022-03-05 DIAGNOSIS — E559 Vitamin D deficiency, unspecified: Secondary | ICD-10-CM | POA: Diagnosis not present

## 2022-03-05 DIAGNOSIS — J9601 Acute respiratory failure with hypoxia: Secondary | ICD-10-CM | POA: Diagnosis not present

## 2022-03-05 DIAGNOSIS — I5023 Acute on chronic systolic (congestive) heart failure: Secondary | ICD-10-CM | POA: Diagnosis not present

## 2022-03-19 ENCOUNTER — Encounter (HOSPITAL_COMMUNITY): Payer: Self-pay

## 2022-03-19 ENCOUNTER — Other Ambulatory Visit (HOSPITAL_COMMUNITY): Payer: Self-pay

## 2022-03-19 NOTE — Progress Notes (Signed)
Today had a home visit with Heidi Whitaker and daughter.  She states she is great. Lungs are clear, heart rate is controlled.  Swelling in left leg area, leaving sock lines.  Right is none.  Down 3 lbs from yesterday, daughter advised could have missed weighed or lost.  She states appetite has been decreasing but this is a sudden lost over night.  She will weigh again in the morning and give lasix accordingly to weight.  She did not drink much water yesterday, kept refusing it.  Today advised her to drink 60 ounces.  She denies chest pain, headaches, dizziness or increased shortness of breath.  She stays active doing small chores around the home.  She sat outside some this morning.  When nice outside she will sit outside. She still making the steps upstairs ok, she rest in the middle on a chair.  Her daughter lays her medications out for her.  She lives with daughter and family.  She is aware of appts.  Will continue to visit for heart failure, diet and medication management.  ? ?Heidi Whitaker ?[place] EMT-Paramedic ?[phone] ?

## 2022-03-27 ENCOUNTER — Encounter (INDEPENDENT_AMBULATORY_CARE_PROVIDER_SITE_OTHER): Payer: Medicare HMO

## 2022-03-27 ENCOUNTER — Ambulatory Visit (INDEPENDENT_AMBULATORY_CARE_PROVIDER_SITE_OTHER): Payer: Medicare HMO | Admitting: Vascular Surgery

## 2022-03-27 ENCOUNTER — Telehealth: Payer: Self-pay | Admitting: Nurse Practitioner

## 2022-03-27 NOTE — Telephone Encounter (Signed)
Spoke with daughter [name] and have rescheduled the 04/07/22 Palliative f/u visit to 04/29/22 @ 12:30 PM, per daughter's request. ?

## 2022-03-31 DIAGNOSIS — Z933 Colostomy status: Secondary | ICD-10-CM | POA: Diagnosis not present

## 2022-03-31 DIAGNOSIS — K56609 Unspecified intestinal obstruction, unspecified as to partial versus complete obstruction: Secondary | ICD-10-CM | POA: Diagnosis not present

## 2022-04-07 ENCOUNTER — Other Ambulatory Visit: Payer: Medicare HMO | Admitting: Nurse Practitioner

## 2022-04-09 DIAGNOSIS — H524 Presbyopia: Secondary | ICD-10-CM | POA: Diagnosis not present

## 2022-04-14 ENCOUNTER — Encounter (HOSPITAL_COMMUNITY): Payer: Self-pay

## 2022-04-14 ENCOUNTER — Other Ambulatory Visit (HOSPITAL_COMMUNITY): Payer: Self-pay

## 2022-04-15 DIAGNOSIS — H903 Sensorineural hearing loss, bilateral: Secondary | ICD-10-CM | POA: Diagnosis not present

## 2022-04-15 DIAGNOSIS — H6123 Impacted cerumen, bilateral: Secondary | ICD-10-CM | POA: Diagnosis not present

## 2022-04-15 NOTE — Progress Notes (Signed)
Today had a home visit with Heidi Whitaker.  She has been doing better.  Heart rate controlled and her weight has been stable.  She is down 2 lbs today and she has been having diarrhea and not drinking as much water.  Cautioned her on drinking to stay hydrated.  She has no edema in ankle area.  Appetite is same.  Discussed with her doctor about calling her PCP about the diarrhea, it is chronic in nature.  Her daughter would like a new PCP from her mother, gave a few that are in the area.  She denies any chest pain, headaches, dizziness or increased shortness of breath.  She has all her medications and daughter places them out for her.  She is aware of how to take the fluid pill according to weight. She has been to eye doctors and got new glasses and she is going to ear doctor to get ears cleaned out in hopeful that will help her hearing.  She has everything for daily living.  Discussed Palliative visits with her and she advised she does not like the copay and she really does not need the visit unless she is sick.  Will send a message to Palliative to see if they can cancel the visit that is upcoming and call when they need one.  Palliative stated they will cancel and they can call, advised the daughter.  Will continue to visit for heart failure, diet and medication management.   ? ?Josejuan Hoaglin ?[place] EMT-Paramedic ?[phone] ?

## 2022-04-23 ENCOUNTER — Telehealth: Payer: Self-pay | Admitting: Cardiovascular Disease

## 2022-04-23 NOTE — Telephone Encounter (Signed)
Called and spoke with Chisty from the Paramedicine program. ?She advised that the patient reached out to her on Monday to see if there is any cheaper alternative to Eliquis. ?Per [name], the patient is refusing to pay $147 for Eliquis anymore. ?I advised [name] that alternative wise, Xarelto and Pradaxa are still branded although Pradaxa is supposed to be going generic. ?Cost wise, warfarin is the option, but that will require scheduled lab draws and close dietary management.  ? ?[name] advised that the patient lives with her daughter and can afford the drug, but is just refusing to pay for this.  ? ?[name] states she will most likely not qualify for assistance.  ? ?I confirmed with [name] the patient has about 1.5 weeks worth of medication on hand. ?[name] advised I will forward to [place] for further review and we can reach out to the patient / her daughter. ? ? ?

## 2022-04-23 NOTE — Telephone Encounter (Signed)
Pt c/o medication issue: ? ?1. Name of Medication: apixaban (ELIQUIS) 5 MG TABS tablet ? ?2. How are you currently taking this medication (dosage and times per day)? 1 tablet twice a day ? ?3. Are you having a reaction (difficulty breathing--STAT)? no ? ?4. What is your medication issue? [name] from [facility] states the patient is refusing to refill the eliquis and wants to know if there is something cheaper. Phone: [phone] ? ?

## 2022-04-29 ENCOUNTER — Other Ambulatory Visit: Payer: Medicare HMO | Admitting: Nurse Practitioner

## 2022-04-30 DIAGNOSIS — Z933 Colostomy status: Secondary | ICD-10-CM | POA: Diagnosis not present

## 2022-04-30 DIAGNOSIS — K56609 Unspecified intestinal obstruction, unspecified as to partial versus complete obstruction: Secondary | ICD-10-CM | POA: Diagnosis not present

## 2022-05-14 ENCOUNTER — Other Ambulatory Visit (HOSPITAL_COMMUNITY): Payer: Self-pay

## 2022-05-14 ENCOUNTER — Encounter (HOSPITAL_COMMUNITY): Payer: Self-pay

## 2022-05-14 NOTE — Progress Notes (Signed)
Had a home visit with Heidi Whitaker and daughter today.  Her weight has been staying the same.  She has been taking her meds regularly.  She weighs daily.  She has been sitting outside a lot since pretty and warmer.  Her daughter states her feet hurt at night and swell but goes down during the night.  She has no edema in legs, ankles or feet today.  She does not wear socks and states too hot.  She still not drinking enough but keeps trying to increase.  Lungs are clear and abdomen is soft.  Daughter states her appetite is not as good but she eats 2 meals a day and a snack in the evening.  She had a lot of company that tired her out during Mothers day, but she has recovered.  She denies having any problems today.  She has all her medications.  Will continue to visit for heart failure, diet and medication management.   [place] [phone]

## 2022-05-27 ENCOUNTER — Telehealth (HOSPITAL_COMMUNITY): Payer: Self-pay

## 2022-05-27 NOTE — Telephone Encounter (Signed)
Contacted [name], Jerriann's daughter to remind her of HF appt tomorrow.  She expressed to me she forgot about it, she had rotator cuff surgery yesterday.  She states there is no way she would be able to bring her mother.  She states her mother has been doing great with weighing and taking her meds.  Her weight fluctuates a 1 up and down.  She doubles her fluid pill everyother day and appears to working great.  Her Mom sits outside on pretty days, she gets some swelling in ankle area but gone in the am.  She would like to cancel the appt for now and reschedule at a later time when she is better.  Will try to make a home visit with them next week.  Will let the HF clinic know to cancel the appt.   [place] [phone]

## 2022-05-28 ENCOUNTER — Ambulatory Visit: Payer: Medicare HMO | Admitting: Family

## 2022-05-29 DIAGNOSIS — Z933 Colostomy status: Secondary | ICD-10-CM | POA: Diagnosis not present

## 2022-05-29 DIAGNOSIS — K56609 Unspecified intestinal obstruction, unspecified as to partial versus complete obstruction: Secondary | ICD-10-CM | POA: Diagnosis not present

## 2022-06-02 ENCOUNTER — Telehealth (HOSPITAL_COMMUNITY): Payer: Self-pay

## 2022-06-02 NOTE — Telephone Encounter (Signed)
Contacted Iness's daughter and she is doing well from her surgery.  She states her Mom is doing well.  Her weight has gone down about 2 lbs but no symptoms of dizziness, chest pain or increased shortness of breath.  Her dementia is getting worse, she is staying up more at night pacing and sleeping more in the day.  She is confused some during the day.  She was angry about her daughter shoulder surgery.  She is enjoying sitting outside and walks her dog a short distance.  She is very mobile and uses no assistance.  She does not want to snack or eat big meals.  With her daughter surgery she would like to delay appts at home right now but will call if sees any problems.  Will continue to reach out often to provide any service I could help with.    [place] [phone]

## 2022-06-10 DIAGNOSIS — H903 Sensorineural hearing loss, bilateral: Secondary | ICD-10-CM | POA: Diagnosis not present

## 2022-06-10 DIAGNOSIS — H6123 Impacted cerumen, bilateral: Secondary | ICD-10-CM | POA: Diagnosis not present

## 2022-06-15 ENCOUNTER — Telehealth: Payer: Self-pay | Admitting: Internal Medicine

## 2022-06-15 NOTE — Telephone Encounter (Signed)
Ryan from [facility] drug request clarification on a medication for pt-glimepiride

## 2022-06-16 ENCOUNTER — Other Ambulatory Visit: Payer: Self-pay

## 2022-06-16 DIAGNOSIS — N183 Chronic kidney disease, stage 3 unspecified: Secondary | ICD-10-CM

## 2022-06-16 MED ORDER — GLIPIZIDE ER 2.5 MG PO TB24: 2.5000 mg | ORAL_TABLET | Freq: Every day | ORAL | 1 refills | Status: DC

## 2022-06-30 DIAGNOSIS — K56609 Unspecified intestinal obstruction, unspecified as to partial versus complete obstruction: Secondary | ICD-10-CM | POA: Diagnosis not present

## 2022-06-30 DIAGNOSIS — Z933 Colostomy status: Secondary | ICD-10-CM | POA: Diagnosis not present

## 2022-07-05 ENCOUNTER — Telehealth: Payer: Self-pay | Admitting: Internal Medicine

## 2022-07-08 ENCOUNTER — Telehealth (HOSPITAL_COMMUNITY): Payer: Self-pay

## 2022-07-08 NOTE — Telephone Encounter (Signed)
Spoke with daughter today and set up a home visit for next week.  She states her Mom is doing really well as far as her heart failure.  Weight been same and no swelling.  She is not eating as much and still not drinking a lot.  Her Mom dementia is getting worse, she is not wanting to pay for any thing dealing with her health.  We have discussed her keeping a check book for her Mom to see and then keeping a separate one for her to keep up with.  We discussed the Eliquis and importance of keeping her on it and she agrees.  Christin with Palliative asked me to talk to them about a visit and with the 50$ copay they dont see where palliative is needed right now but later down the road they will need them.  Christin advised she will cancel it for now and they can call and reinstate it when needed.  She is thinking about the cataract surgery now and has appt next week to check on it.  She was against it but she does not want to go blind.  Will visit for heart failure, and medication management.   [place] [phone]

## 2022-07-20 ENCOUNTER — Other Ambulatory Visit (HOSPITAL_COMMUNITY): Payer: Self-pay

## 2022-07-20 NOTE — Telephone Encounter (Signed)
Pt daughter called stating pt need refill on potassium chloride [facility]

## 2022-07-21 ENCOUNTER — Telehealth: Payer: Self-pay | Admitting: Cardiovascular Disease

## 2022-07-21 ENCOUNTER — Encounter (HOSPITAL_COMMUNITY): Payer: Self-pay

## 2022-07-21 MED ORDER — POTASSIUM CHLORIDE CRYS ER 10 MEQ PO TBCR: EXTENDED_RELEASE_TABLET | ORAL | 0 refills | Status: DC

## 2022-07-21 NOTE — Progress Notes (Signed)
Today had a home visit with Kyleena and daughter.  She states doing well.  She has no edema in lower legs and abdomen is soft.  We discussed the importance and expense of her eliquis and she said she will continue to pay for it instead of changing it to coumadin and having blood work done all the time.  She has decided not to get cataract surgery due to time at the eye facility to have It done.  She is eating but small meals.  She enjoys the warm weather and taking her dog for walks.  She is trying to get her water in daily.  Her daughter takes care of her meds.  Markelle does well remembering weighing.  Her weight has been within 1 to 2 lbs daily.  She stays active at home.  She does not like to go out except to appts and she does not like to go to them.  She denies any increased shortness of breath, headaches, dizziness or any chest pain.  She is still going up the stairs once a day to go to her room.  Advised her she need to get with PCP her GI about her GI issues.  They appears to understand.  Palliative care is on hold til the daughter thinks they need to revisit, she does not think there is a need right now for her mother has been doing well.  She has all her medications but need new scripts for potassium and furosemide 20 mg, will get cardiology to send to mail order pharmacy.  Will continue to visit for heart failure, diet and medication management.   [place] [phone]

## 2022-07-21 NOTE — Telephone Encounter (Signed)
  Pt c/o medication issue:  1. Name of Medication: furosemide (LASIX) 40 MG tablet  2. How are you currently taking this medication (dosage and times per day)? Take by mouth daily. Alternates '40mg'$  and '20mg'$  QOD  3. Are you having a reaction (difficulty breathing--STAT)? no  4. What is your medication issue? Per Steffanie Dunn, pt needs 20 mg furosemide refill [facility] [place] [place] [facility] [employer]

## 2022-07-21 NOTE — Telephone Encounter (Signed)
[facility] is called about pt medication and want to know if it has been approved or not Phone number [phone] option 2 Reference # [id number]

## 2022-07-21 NOTE — Telephone Encounter (Signed)
 *  STAT* If patient is at the pharmacy, call can be transferred to refill team.   1. Which medications need to be refilled? (please list name of each medication and dose if known) potassium chloride (KLOR-CON M10) 10 MEQ tablet  2. Which pharmacy/location (including street and city if local pharmacy) is medication to be sent to? [facility] [place] [place] [facility]  3. Do they need a 30 day or 90 day supply? 90 days

## 2022-07-21 NOTE — Telephone Encounter (Signed)
Requested Prescriptions   Signed Prescriptions Disp Refills   potassium chloride (KLOR-CON M10) 10 MEQ tablet 360 tablet 0    Sig: TAKE 2 TABLETS TWICE A DAY    Authorizing Provider: Minna Merritts    Ordering User: Britt Bottom

## 2022-07-23 MED ORDER — FUROSEMIDE 40 MG PO TABS: 40.0000 mg | ORAL_TABLET | ORAL | 0 refills | Status: DC

## 2022-07-23 NOTE — Telephone Encounter (Signed)
Left voicemail message informing Steffanie Dunn that Rx was sent to [facility] as requested.

## 2022-07-23 NOTE — Telephone Encounter (Signed)
Requested Prescriptions   Signed Prescriptions Disp Refills   furosemide (LASIX) 40 MG tablet 70 tablet 0    Sig: Take 1 tablet (40 mg total) by mouth as directed. Alternates between '40mg'$  and '20mg'$  QOD    Authorizing Provider: Minna Merritts    Ordering User: Raelene Bott, [place] L

## 2022-07-23 NOTE — Telephone Encounter (Signed)
Follow Up:     Heidi Whitaker is calling back to check on the status of patient's Furosemide refill. From 07-21-22

## 2022-07-30 DIAGNOSIS — Z933 Colostomy status: Secondary | ICD-10-CM | POA: Diagnosis not present

## 2022-07-30 DIAGNOSIS — K56609 Unspecified intestinal obstruction, unspecified as to partial versus complete obstruction: Secondary | ICD-10-CM | POA: Diagnosis not present

## 2022-08-07 ENCOUNTER — Telehealth: Payer: Self-pay | Admitting: Internal Medicine

## 2022-08-07 NOTE — Telephone Encounter (Signed)
Pt need refill on all medication prescribed from the provider sent to [facility]

## 2022-08-10 ENCOUNTER — Other Ambulatory Visit: Payer: Self-pay

## 2022-08-10 DIAGNOSIS — N183 Chronic kidney disease, stage 3 unspecified: Secondary | ICD-10-CM

## 2022-08-10 DIAGNOSIS — E1122 Type 2 diabetes mellitus with diabetic chronic kidney disease: Secondary | ICD-10-CM

## 2022-08-10 MED ORDER — FUROSEMIDE 40 MG PO TABS: 40.0000 mg | ORAL_TABLET | ORAL | 0 refills | Status: DC

## 2022-08-10 MED ORDER — METOPROLOL SUCCINATE ER 50 MG PO TB24: ORAL_TABLET | ORAL | 1 refills | Status: DC

## 2022-08-10 MED ORDER — DIGOXIN 125 MCG PO TABS: 0.0625 mg | ORAL_TABLET | Freq: Every day | ORAL | 3 refills | Status: DC

## 2022-08-10 MED ORDER — APIXABAN 5 MG PO TABS
5.0000 mg | ORAL_TABLET | Freq: Two times a day (BID) | ORAL | 3 refills | Status: DC
Start: 2022-08-10 — End: 2022-09-25

## 2022-08-10 MED ORDER — D3-1000 25 MCG (1000 UT) PO CAPS: 2000.0000 [IU] | ORAL_CAPSULE | Freq: Every day | ORAL | 3 refills | Status: DC

## 2022-08-10 MED ORDER — POTASSIUM CHLORIDE CRYS ER 10 MEQ PO TBCR: EXTENDED_RELEASE_TABLET | ORAL | 0 refills | Status: DC

## 2022-08-10 MED ORDER — GLIPIZIDE ER 2.5 MG PO TB24: 2.5000 mg | ORAL_TABLET | Freq: Every day | ORAL | 1 refills | Status: DC

## 2022-08-10 MED ORDER — LOVASTATIN 20 MG PO TABS: ORAL_TABLET | ORAL | 3 refills | Status: DC

## 2022-08-18 ENCOUNTER — Other Ambulatory Visit (HOSPITAL_COMMUNITY): Payer: Self-pay

## 2022-08-18 NOTE — Progress Notes (Signed)
Spoke with daughter today about visiting.  We will have a visit at home in September.  She is doing well as far as her breathing, no chest pain and edema.  She is mostly sitting inside sleeping a lot.  Appears more confused and refuses to have cataract surgery so she can not see as good.  She is weighing daily but daughter is puzzled how she is reading the scale.  She is eating but small meals.  She feels her mother is giving up but not totally.  She does not like to leave the home.  She has a new PCP that she will see in October, her old one is leaving the practice and has sent in new prescriptions for all her medications to last til new PCP appt.  She also has cardiology appt coming up.  Her mother does not like appts, have cancelled Tina appts at the moment but will add them if needed for the future.  Daughter states her dementia/alzheimers is getting much worse.  Will continue to visit for heart failure, diet and medication management.   [place] [phone]

## 2022-08-22 NOTE — Progress Notes (Unsigned)
Cardiology Office Note  Date:  08/25/2022   ID:  Heidi Whitaker, DOB 13-Dec-1929, MRN [id number]  PCP:  McLean-Scocuzza, Nino Glow, MD   Chief Complaint  Patient presents with   6 month follow up     "Doing well." Medications reviewed by the patient verbally.     HPI:  88 year old woman with medical history significant for  CHF with EF of 45 to 50%,  hypertension,  hyperlipidemia,  type 2 diabetes mellitus,  stroke,  DVT, not on anticoagulation,  hard of hearing,  chronic kidney disease stage III  PAD, AAA,  chronic diarrhea,  atrial fibrillation not on anticoagulation,  status post colostomy due to 2 benign tumors per her daughter.   Who presents for follow-up of her atrial fibrillation  LOV 2/23 Presents today with her daughter  In the hospital November 2022, atrial fibrillation noted  low ejection fraction noted Was not started on anticoagulation Plavix held , started on Eliquis 5 twice daily Started on low-dose digoxin for rate control Tolerating Lasix 20 alternating with 40 mg  every other day Preferred no ischemic work-up for cardiomyopathy  Has underlying dementia Notes indicating she is DNR  Echo  reviewed, ejection fraction 35 to 40%  Sedentary at baseline, no regular exercise program, no recent falls No significant lower extremity edema, no PND orthopnea, no abdominal swelling Denies any tachypalpitations Does not appreciate atrial fibrillation  EKG personally reviewed by myself on todays visit Atrial fibrillation ventricular rate 99 bpm consider old anterior MI/consider intraventricular conduction delay  Other past medical history reviewed In the hospital November 2022 with symptoms of shortness of breath and weakness,  A. fib with rapid ventricular rate of 160  desaturating in the 88% on room air , 92 to 95% with 2 L/min.  Previous echocardiogram May 2022 EF 45 to 50%.   Repeat echocardiogram EF worsened at 35-40%   PMH:   has a past medical  history of Arrhythmia, CHF (congestive heart failure) ([place]), Complication of anesthesia, Diabetes mellitus without complication ([place]), DVT (deep venous thrombosis) ([place]), Hard of hearing, Hypertension, and PAD (peripheral artery disease) ([place]).  PSH:    Past Surgical History:  Procedure Laterality Date   CESAREAN SECTION     COLONOSCOPY N/A 05/17/2019   Procedure: FLEXIBLE SIGMOIDOSCOPY;  Surgeon: Ileana Roup, MD;  [facility]: [facility];  Service: General;  Laterality: N/A;   COLONOSCOPY WITH PROPOFOL N/A 01/09/2019   Procedure: COLONOSCOPY WITH PROPOFOL;  Surgeon: Lin Landsman, MD;  Location: [facility] ENDOSCOPY;  Service: Gastroenterology;  Laterality: N/A;   ESOPHAGOGASTRODUODENOSCOPY (EGD) WITH PROPOFOL N/A 01/09/2019   Procedure: ESOPHAGOGASTRODUODENOSCOPY (EGD) WITH PROPOFOL;  Surgeon: Lin Landsman, MD;  Location: [facility] ENDOSCOPY;  Service: Gastroenterology;  Laterality: N/A;   KNEE SURGERY     left 2017/2018   LOWER EXTREMITY ANGIOGRAPHY Left 05/29/2021   Procedure: LOWER EXTREMITY ANGIOGRAPHY;  Surgeon: Algernon Huxley, MD;  Location: [place] CV LAB;  Service: Cardiovascular;  Laterality: Left;   LOWER EXTREMITY ANGIOGRAPHY Right 06/05/2021   Procedure: LOWER EXTREMITY ANGIOGRAPHY;  Surgeon: Algernon Huxley, MD;  Location: [place] CV LAB;  Service: Cardiovascular;  Laterality: Right;   SHOULDER SURGERY     right   XI ROBOTIC ASSISTED LOWER ANTERIOR RESECTION N/A 05/17/2019   Procedure: XI ROBOTIC ASSISTED LOWER ANTERIOR RESECTION WITH END COLOSTOMY;  Surgeon: Ileana Roup, MD;  [facility];  Service: General;  Laterality: N/A;    Current Outpatient Medications  Medication Sig Dispense Refill   apixaban (  ELIQUIS) 5 MG TABS tablet Take 1 tablet (5 mg total) by mouth 2 (two) times daily. 180 tablet 3   Cholecalciferol (D3-1000) 25 MCG (1000 UT) capsule Take 2 capsules (2,000 Units total) by mouth daily. 60 capsule 3   digoxin (LANOXIN) 0.125 MG tablet Take 0.5  tablets (0.0625 mg total) by mouth daily. 45 tablet 3   furosemide (LASIX) 40 MG tablet Take 1 tablet (40 mg total) by mouth as directed. Alternates between '40mg'$  and '20mg'$  QOD 70 tablet 0   glipiZIDE (GLUCOTROL XL) 2.5 MG 24 hr tablet Take 1 tablet (2.5 mg total) by mouth daily with breakfast. 90 tablet 1   lovastatin (MEVACOR) 20 MG tablet TAKE 1 TABLET DAILY AT 6PM 90 tablet 3   metoprolol succinate (TOPROL-XL) 50 MG 24 hr tablet Take 1 tablet (50 mg) in the AM and 1/2 tablet (25 mg) in the PM 135 tablet 1   potassium chloride (KLOR-CON M10) 10 MEQ tablet TAKE 2 TABLETS TWICE A DAY 360 tablet 0   No current facility-administered medications for this visit.    Allergies:   Patient has no known allergies.   Social History:  The patient  reports that she has quit smoking. Her smoking use included cigarettes. She has never used smokeless tobacco. She reports that she does not drink alcohol and does not use drugs.   Family History:   family history includes Addison's disease in her maternal aunt; Heart disease in her father and mother.    Review of Systems: Review of Systems  Constitutional: Negative.   HENT: Negative.    Respiratory: Negative.    Cardiovascular: Negative.   Gastrointestinal: Negative.   Musculoskeletal: Negative.   Neurological: Negative.   Psychiatric/Behavioral: Negative.    All other systems reviewed and are negative.   PHYSICAL EXAM: VS:  BP 100/60 (BP Location: Left Arm, Patient Position: Sitting, Cuff Size: Normal)   Pulse 99   Ht '5\' 4"'$  (1.626 m)   Wt 168 lb 9 oz (76.5 kg)   SpO2 95%   BMI 28.93 kg/m  , BMI Body mass index is 28.93 kg/m. Constitutional:  oriented to person, place, and time. No distress.  HENT:  Head: Grossly normal Eyes:  no discharge. No scleral icterus.  Neck: No JVD, no carotid bruits  Cardiovascular: Irregularly irregular , no murmurs appreciated Pulmonary/Chest: Clear to auscultation bilaterally, no wheezes or rails Abdominal:  Soft.  no distension.  no tenderness.  Musculoskeletal: Normal range of motion Neurological:  normal muscle tone. Coordination normal. No atrophy Skin: Skin warm and dry Psychiatric: normal affect, pleasant  Recent Labs: 10/31/2021: ALT 10; B Natriuretic Peptide 355.8; Magnesium 2.5 11/03/2021: Hemoglobin 13.2; Platelets 306 02/24/2022: BUN 18; Creatinine, Ser 1.15; Potassium 4.6; Sodium 138    Lipid Panel Lab Results  Component Value Date   CHOL 141 11/27/2020   HDL 43.80 11/27/2020   LDLCALC 78 11/27/2020   TRIG 98.0 11/27/2020      Wt Readings from Last 3 Encounters:  08/25/22 168 lb 9 oz (76.5 kg)  07/20/22 171 lb (77.6 kg)  05/14/22 172 lb (78 kg)     ASSESSMENT AND PLAN:  Problem List Items Addressed This Visit       Cardiology Problems   Chronic systolic heart failure (HCC)   Relevant Orders   EKG 12-Lead   HTN (hypertension)   Relevant Orders   EKG 12-Lead   Hyperlipidemia   PAD (peripheral artery disease) (HCC)   Relevant Orders   EKG 12-Lead  Other   Type II diabetes mellitus with renal manifestations ([place])   Other Visit Diagnoses     Persistent atrial fibrillation ([place])    -  Primary   Relevant Orders   EKG 12-Lead   Atherosclerosis of artery of extremity with rest pain (HCC)       Relevant Orders   EKG 12-Lead     Persistent atrial fibrillation Continue current dose metoprolol 50 in the morning 20 5 in the PM Continue low-dose digoxin, Lasix 40 alternating with 20,  Eliquis 5 twice daily, does not qualify for reduced dosing given weight above 60 kg, stable renal function  Dilated cardiomyopathy Continue digoxin, Lasix, rate control Previously declined ischemic work-up  Chronic diastolic and systolic CHF Appears relatively euvolemic, continue Lasix 40/20 every other day No medication changes made, low blood pressure limiting titration of her medications Reports that she has follow-up with Dr. Einar Pheasant next month Could consider  lab work at that time    Total encounter time more than 30 minutes  Greater than 50% was spent in counseling and coordination of care with the patient    Signed, Esmond Plants, M.D., Ph.D. [place], [place]

## 2022-08-25 ENCOUNTER — Ambulatory Visit: Payer: Medicare HMO | Attending: Cardiovascular Disease | Admitting: Cardiovascular Disease

## 2022-08-25 ENCOUNTER — Encounter: Payer: Self-pay | Admitting: Cardiovascular Disease

## 2022-08-25 VITALS — BP 100/60 | HR 99 | Ht 64.0 in | Wt 168.6 lb

## 2022-08-25 DIAGNOSIS — E785 Hyperlipidemia, unspecified: Secondary | ICD-10-CM

## 2022-08-25 DIAGNOSIS — I10 Essential (primary) hypertension: Secondary | ICD-10-CM

## 2022-08-25 DIAGNOSIS — I4819 Other persistent atrial fibrillation: Secondary | ICD-10-CM | POA: Diagnosis not present

## 2022-08-25 DIAGNOSIS — I5022 Chronic systolic (congestive) heart failure: Secondary | ICD-10-CM | POA: Diagnosis not present

## 2022-08-25 DIAGNOSIS — E1122 Type 2 diabetes mellitus with diabetic chronic kidney disease: Secondary | ICD-10-CM | POA: Diagnosis not present

## 2022-08-25 DIAGNOSIS — I739 Peripheral vascular disease, unspecified: Secondary | ICD-10-CM | POA: Diagnosis not present

## 2022-08-25 DIAGNOSIS — N1831 Chronic kidney disease, stage 3a: Secondary | ICD-10-CM

## 2022-08-25 DIAGNOSIS — I70229 Atherosclerosis of native arteries of extremities with rest pain, unspecified extremity: Secondary | ICD-10-CM

## 2022-08-25 NOTE — Patient Instructions (Signed)
Medication Instructions:  No changes  If you need a refill on your cardiac medications before your next appointment, please call your pharmacy.   Lab work: No new labs needed  Testing/Procedures: No new testing needed  Follow-Up: At [facility], you and your health needs are our priority.  As part of our continuing mission to provide you with exceptional heart care, we have created designated Provider Care Teams.  These Care Teams include your primary Cardiologist (physician) and Advanced Practice Providers (APPs -  Physician Assistants and Nurse Practitioners) who all work together to provide you with the care you need, when you need it.  You will need a follow up appointment in 12 months  Providers on your designated Care Team:   Christopher Berge, NP Ryan Dunn, PA-C Cadence Furth, PA-C  COVID-19 Vaccine Information can be found at: [email or web address] For questions related to vaccine distribution or appointments, please email [email or web address] or call [phone].   

## 2022-08-28 DIAGNOSIS — Z933 Colostomy status: Secondary | ICD-10-CM | POA: Diagnosis not present

## 2022-08-28 DIAGNOSIS — K56609 Unspecified intestinal obstruction, unspecified as to partial versus complete obstruction: Secondary | ICD-10-CM | POA: Diagnosis not present

## 2022-09-18 ENCOUNTER — Emergency Department: Payer: Medicare HMO

## 2022-09-18 ENCOUNTER — Inpatient Hospital Stay: Payer: Medicare HMO

## 2022-09-18 ENCOUNTER — Other Ambulatory Visit: Payer: Self-pay

## 2022-09-18 ENCOUNTER — Inpatient Hospital Stay
Admission: EM | Admit: 2022-09-18 | Discharge: 2022-09-25 | DRG: 480 | Disposition: A | Discharge status: Hospice | Payer: Medicare HMO | Attending: Internal Medicine | Admitting: Internal Medicine

## 2022-09-18 ENCOUNTER — Telehealth: Payer: Self-pay | Admitting: Internal Medicine

## 2022-09-18 DIAGNOSIS — R69 Illness, unspecified: Secondary | ICD-10-CM | POA: Diagnosis not present

## 2022-09-18 DIAGNOSIS — S7292XA Unspecified fracture of left femur, initial encounter for closed fracture: Secondary | ICD-10-CM | POA: Diagnosis not present

## 2022-09-18 DIAGNOSIS — I38 Endocarditis, valve unspecified: Secondary | ICD-10-CM | POA: Diagnosis not present

## 2022-09-18 DIAGNOSIS — I824Z2 Acute embolism and thrombosis of unspecified deep veins of left distal lower extremity: Secondary | ICD-10-CM | POA: Diagnosis not present

## 2022-09-18 DIAGNOSIS — W010XXA Fall on same level from slipping, tripping and stumbling without subsequent striking against object, initial encounter: Secondary | ICD-10-CM | POA: Diagnosis not present

## 2022-09-18 DIAGNOSIS — Z743 Need for continuous supervision: Secondary | ICD-10-CM | POA: Diagnosis not present

## 2022-09-18 DIAGNOSIS — S72302A Unspecified fracture of shaft of left femur, initial encounter for closed fracture: Secondary | ICD-10-CM | POA: Diagnosis not present

## 2022-09-18 DIAGNOSIS — Z7189 Other specified counseling: Secondary | ICD-10-CM | POA: Diagnosis not present

## 2022-09-18 DIAGNOSIS — F01518 Vascular dementia, unspecified severity, with other behavioral disturbance: Secondary | ICD-10-CM | POA: Diagnosis present

## 2022-09-18 DIAGNOSIS — I5023 Acute on chronic systolic (congestive) heart failure: Secondary | ICD-10-CM | POA: Diagnosis not present

## 2022-09-18 DIAGNOSIS — E1151 Type 2 diabetes mellitus with diabetic peripheral angiopathy without gangrene: Secondary | ICD-10-CM | POA: Diagnosis not present

## 2022-09-18 DIAGNOSIS — Z96652 Presence of left artificial knee joint: Secondary | ICD-10-CM | POA: Diagnosis not present

## 2022-09-18 DIAGNOSIS — R000 Tachycardia, unspecified: Secondary | ICD-10-CM | POA: Diagnosis not present

## 2022-09-18 DIAGNOSIS — Z8249 Family history of ischemic heart disease and other diseases of the circulatory system: Secondary | ICD-10-CM

## 2022-09-18 DIAGNOSIS — E559 Vitamin D deficiency, unspecified: Secondary | ICD-10-CM | POA: Diagnosis present

## 2022-09-18 DIAGNOSIS — Z515 Encounter for palliative care: Secondary | ICD-10-CM

## 2022-09-18 DIAGNOSIS — M25562 Pain in left knee: Secondary | ICD-10-CM | POA: Diagnosis not present

## 2022-09-18 DIAGNOSIS — D72829 Elevated white blood cell count, unspecified: Secondary | ICD-10-CM | POA: Diagnosis present

## 2022-09-18 DIAGNOSIS — Z471 Aftercare following joint replacement surgery: Secondary | ICD-10-CM | POA: Diagnosis not present

## 2022-09-18 DIAGNOSIS — I4819 Other persistent atrial fibrillation: Secondary | ICD-10-CM | POA: Diagnosis not present

## 2022-09-18 DIAGNOSIS — Z79899 Other long term (current) drug therapy: Secondary | ICD-10-CM

## 2022-09-18 DIAGNOSIS — Z7401 Bed confinement status: Secondary | ICD-10-CM | POA: Diagnosis not present

## 2022-09-18 DIAGNOSIS — E1122 Type 2 diabetes mellitus with diabetic chronic kidney disease: Secondary | ICD-10-CM | POA: Diagnosis not present

## 2022-09-18 DIAGNOSIS — Z7901 Long term (current) use of anticoagulants: Secondary | ICD-10-CM | POA: Diagnosis not present

## 2022-09-18 DIAGNOSIS — G4700 Insomnia, unspecified: Secondary | ICD-10-CM | POA: Diagnosis not present

## 2022-09-18 DIAGNOSIS — I5022 Chronic systolic (congestive) heart failure: Secondary | ICD-10-CM | POA: Diagnosis not present

## 2022-09-18 DIAGNOSIS — I10 Essential (primary) hypertension: Secondary | ICD-10-CM | POA: Diagnosis present

## 2022-09-18 DIAGNOSIS — F0150 Vascular dementia without behavioral disturbance: Secondary | ICD-10-CM | POA: Diagnosis present

## 2022-09-18 DIAGNOSIS — E669 Obesity, unspecified: Secondary | ICD-10-CM | POA: Diagnosis not present

## 2022-09-18 DIAGNOSIS — I509 Heart failure, unspecified: Secondary | ICD-10-CM | POA: Diagnosis not present

## 2022-09-18 DIAGNOSIS — R0689 Other abnormalities of breathing: Secondary | ICD-10-CM | POA: Diagnosis not present

## 2022-09-18 DIAGNOSIS — R5381 Other malaise: Secondary | ICD-10-CM | POA: Diagnosis not present

## 2022-09-18 DIAGNOSIS — M79605 Pain in left leg: Secondary | ICD-10-CM | POA: Diagnosis not present

## 2022-09-18 DIAGNOSIS — N183 Chronic kidney disease, stage 3 unspecified: Secondary | ICD-10-CM | POA: Diagnosis present

## 2022-09-18 DIAGNOSIS — R0902 Hypoxemia: Secondary | ICD-10-CM | POA: Diagnosis not present

## 2022-09-18 DIAGNOSIS — Z01818 Encounter for other preprocedural examination: Secondary | ICD-10-CM | POA: Diagnosis not present

## 2022-09-18 DIAGNOSIS — Z86718 Personal history of other venous thrombosis and embolism: Secondary | ICD-10-CM | POA: Diagnosis not present

## 2022-09-18 DIAGNOSIS — R531 Weakness: Secondary | ICD-10-CM | POA: Diagnosis not present

## 2022-09-18 DIAGNOSIS — Z01810 Encounter for preprocedural cardiovascular examination: Secondary | ICD-10-CM | POA: Diagnosis not present

## 2022-09-18 DIAGNOSIS — Z66 Do not resuscitate: Secondary | ICD-10-CM | POA: Diagnosis not present

## 2022-09-18 DIAGNOSIS — Z933 Colostomy status: Secondary | ICD-10-CM | POA: Diagnosis not present

## 2022-09-18 DIAGNOSIS — Z7984 Long term (current) use of oral hypoglycemic drugs: Secondary | ICD-10-CM

## 2022-09-18 DIAGNOSIS — N1831 Chronic kidney disease, stage 3a: Secondary | ICD-10-CM | POA: Diagnosis not present

## 2022-09-18 DIAGNOSIS — I110 Hypertensive heart disease with heart failure: Secondary | ICD-10-CM | POA: Diagnosis not present

## 2022-09-18 DIAGNOSIS — Z87891 Personal history of nicotine dependence: Secondary | ICD-10-CM | POA: Diagnosis not present

## 2022-09-18 DIAGNOSIS — E1159 Type 2 diabetes mellitus with other circulatory complications: Secondary | ICD-10-CM | POA: Diagnosis not present

## 2022-09-18 DIAGNOSIS — I152 Hypertension secondary to endocrine disorders: Secondary | ICD-10-CM | POA: Diagnosis present

## 2022-09-18 DIAGNOSIS — E1169 Type 2 diabetes mellitus with other specified complication: Secondary | ICD-10-CM | POA: Diagnosis present

## 2022-09-18 DIAGNOSIS — Y92003 Bedroom of unspecified non-institutional (private) residence as the place of occurrence of the external cause: Secondary | ICD-10-CM

## 2022-09-18 DIAGNOSIS — Z8673 Personal history of transient ischemic attack (TIA), and cerebral infarction without residual deficits: Secondary | ICD-10-CM

## 2022-09-18 DIAGNOSIS — S72352A Displaced comminuted fracture of shaft of left femur, initial encounter for closed fracture: Secondary | ICD-10-CM | POA: Diagnosis not present

## 2022-09-18 DIAGNOSIS — F03918 Unspecified dementia, unspecified severity, with other behavioral disturbance: Secondary | ICD-10-CM | POA: Diagnosis present

## 2022-09-18 DIAGNOSIS — F01C11 Vascular dementia, severe, with agitation: Secondary | ICD-10-CM | POA: Diagnosis not present

## 2022-09-18 DIAGNOSIS — E785 Hyperlipidemia, unspecified: Secondary | ICD-10-CM | POA: Diagnosis present

## 2022-09-18 DIAGNOSIS — W19XXXA Unspecified fall, initial encounter: Secondary | ICD-10-CM | POA: Diagnosis not present

## 2022-09-18 DIAGNOSIS — S72002A Fracture of unspecified part of neck of left femur, initial encounter for closed fracture: Secondary | ICD-10-CM | POA: Diagnosis not present

## 2022-09-18 DIAGNOSIS — S72325A Nondisplaced transverse fracture of shaft of left femur, initial encounter for closed fracture: Secondary | ICD-10-CM | POA: Diagnosis not present

## 2022-09-18 DIAGNOSIS — I739 Peripheral vascular disease, unspecified: Secondary | ICD-10-CM | POA: Diagnosis present

## 2022-09-18 DIAGNOSIS — I130 Hypertensive heart and chronic kidney disease with heart failure and stage 1 through stage 4 chronic kidney disease, or unspecified chronic kidney disease: Secondary | ICD-10-CM | POA: Diagnosis not present

## 2022-09-18 DIAGNOSIS — R627 Adult failure to thrive: Secondary | ICD-10-CM | POA: Diagnosis present

## 2022-09-18 LAB — CBC WITH DIFFERENTIAL/PLATELET
Abs Immature Granulocytes: 0.07 10*3/uL (ref 0.00–0.07)
Basophils Absolute: 0.1 10*3/uL (ref 0.0–0.1)
Basophils Relative: 0 %
Eosinophils Absolute: 0.4 10*3/uL (ref 0.0–0.5)
Eosinophils Relative: 3 %
HCT: 46 % (ref 36.0–46.0)
Hemoglobin: 14.9 g/dL (ref 12.0–15.0)
Immature Granulocytes: 1 %
Lymphocytes Relative: 27 %
Lymphs Abs: 3.8 10*3/uL (ref 0.7–4.0)
MCH: 30.8 pg (ref 26.0–34.0)
MCHC: 32.4 g/dL (ref 30.0–36.0)
MCV: 95 fL (ref 80.0–100.0)
Monocytes Absolute: 1.5 10*3/uL — ABNORMAL HIGH (ref 0.1–1.0)
Monocytes Relative: 10 %
Neutro Abs: 8.4 10*3/uL — ABNORMAL HIGH (ref 1.7–7.7)
Neutrophils Relative %: 59 %
Platelets: 242 10*3/uL (ref 150–400)
RBC: 4.84 MIL/uL (ref 3.87–5.11)
RDW: 13.5 % (ref 11.5–15.5)
WBC: 14.2 10*3/uL — ABNORMAL HIGH (ref 4.0–10.5)
nRBC: 0 % (ref 0.0–0.2)

## 2022-09-18 LAB — BASIC METABOLIC PANEL
Anion gap: 11 (ref 5–15)
BUN: 17 mg/dL (ref 8–23)
CO2: 27 mmol/L (ref 22–32)
Calcium: 9.7 mg/dL (ref 8.9–10.3)
Chloride: 100 mmol/L (ref 98–111)
Creatinine, Ser: 1.16 mg/dL — ABNORMAL HIGH (ref 0.44–1.00)
GFR, Estimated: 45 mL/min — ABNORMAL LOW (ref >60)
Glucose, Bld: 249 mg/dL — ABNORMAL HIGH (ref 70–99)
Potassium: 3.9 mmol/L (ref 3.5–5.1)
Sodium: 138 mmol/L (ref 135–145)

## 2022-09-18 LAB — TROPONIN I (HIGH SENSITIVITY)
Troponin I (High Sensitivity): 9 ng/L (ref <18)
Troponin I (High Sensitivity): 9 ng/L (ref <18)

## 2022-09-18 LAB — PROCALCITONIN: Procalcitonin: <0.1 ng/mL

## 2022-09-18 MED ORDER — FUROSEMIDE 40 MG PO TABS
40.0000 mg | ORAL_TABLET | ORAL | Status: DC
  Administered 2022-09-20: 40 mg via ORAL
  Filled 2022-09-18 (×2): qty 1

## 2022-09-18 MED ORDER — METOPROLOL SUCCINATE ER 50 MG PO TB24: 25.0000 mg | ORAL_TABLET | ORAL | Status: DC

## 2022-09-18 MED ORDER — ONDANSETRON HCL 4 MG/2ML IJ SOLN
4.0000 mg | Freq: Four times a day (QID) | INTRAMUSCULAR | Status: DC | PRN
  Administered 2022-09-18: 4 mg via INTRAVENOUS
  Filled 2022-09-18: qty 2

## 2022-09-18 MED ORDER — SENNOSIDES-DOCUSATE SODIUM 8.6-50 MG PO TABS
1.0000 | ORAL_TABLET | Freq: Every evening | ORAL | Status: DC | PRN
  Administered 2022-09-22: 1 via ORAL
  Filled 2022-09-18: qty 1

## 2022-09-18 MED ORDER — METOPROLOL TARTRATE 5 MG/5ML IV SOLN
2.5000 mg | INTRAVENOUS | Status: AC | PRN
  Administered 2022-09-19 – 2022-09-21 (×4): 2.5 mg via INTRAVENOUS
  Filled 2022-09-18 (×4): qty 5

## 2022-09-18 MED ORDER — POLYETHYLENE GLYCOL 3350 17 G PO PACK
17.0000 g | PACK | Freq: Every day | ORAL | Status: DC | PRN
  Administered 2022-09-23 – 2022-09-24 (×2): 17 g via ORAL
  Filled 2022-09-18 (×2): qty 1

## 2022-09-18 MED ORDER — PRAVASTATIN SODIUM 20 MG PO TABS
20.0000 mg | ORAL_TABLET | Freq: Every day | ORAL | Status: DC
  Administered 2022-09-19 – 2022-09-23 (×4): 20 mg via ORAL
  Filled 2022-09-18 (×4): qty 1

## 2022-09-18 MED ORDER — ENSURE ENLIVE PO LIQD
237.0000 mL | Freq: Two times a day (BID) | ORAL | Status: DC
  Administered 2022-09-22 – 2022-09-23 (×3): 237 mL via ORAL

## 2022-09-18 MED ORDER — MORPHINE SULFATE (PF) 2 MG/ML IV SOLN
0.5000 mg | INTRAVENOUS | Status: DC | PRN
  Administered 2022-09-18 – 2022-09-20 (×4): 0.5 mg via INTRAVENOUS
  Filled 2022-09-18 (×4): qty 1

## 2022-09-18 MED ORDER — FUROSEMIDE 20 MG PO TABS
20.0000 mg | ORAL_TABLET | ORAL | Status: DC
  Administered 2022-09-19: 20 mg via ORAL
  Filled 2022-09-18 (×2): qty 1

## 2022-09-18 MED ORDER — VITAMIN D 25 MCG (1000 UNIT) PO TABS
2000.0000 [IU] | ORAL_TABLET | Freq: Every day | ORAL | Status: DC
  Administered 2022-09-19 – 2022-09-25 (×4): 2000 [IU] via ORAL
  Filled 2022-09-18 (×9): qty 2

## 2022-09-18 MED ORDER — MORPHINE SULFATE (PF) 4 MG/ML IV SOLN
4.0000 mg | Freq: Once | INTRAVENOUS | Status: AC
  Administered 2022-09-18: 4 mg via INTRAVENOUS
  Filled 2022-09-18: qty 1

## 2022-09-18 MED ORDER — DIGOXIN 125 MCG PO TABS
0.0625 mg | ORAL_TABLET | Freq: Every day | ORAL | Status: DC
  Administered 2022-09-19 – 2022-09-25 (×5): 0.0625 mg via ORAL
  Filled 2022-09-18 (×7): qty 0.5

## 2022-09-18 MED ORDER — ONDANSETRON HCL 4 MG/2ML IJ SOLN: 4.0000 mg | Freq: Once | INTRAMUSCULAR | Status: AC

## 2022-09-18 MED ORDER — ADULT MULTIVITAMIN W/MINERALS CH
1.0000 | ORAL_TABLET | Freq: Every day | ORAL | Status: DC
  Administered 2022-09-19 – 2022-09-25 (×6): 1 via ORAL
  Filled 2022-09-18 (×6): qty 1

## 2022-09-18 MED ORDER — HEPARIN SODIUM (PORCINE) 5000 UNIT/ML IJ SOLN
5000.0000 [IU] | Freq: Three times a day (TID) | INTRAMUSCULAR | Status: DC
  Administered 2022-09-18 – 2022-09-20 (×8): 5000 [IU] via SUBCUTANEOUS
  Filled 2022-09-18 (×8): qty 1

## 2022-09-18 MED ORDER — METOPROLOL SUCCINATE ER 50 MG PO TB24
50.0000 mg | ORAL_TABLET | Freq: Every day | ORAL | Status: DC
  Administered 2022-09-19 – 2022-09-25 (×6): 50 mg via ORAL
  Filled 2022-09-18 (×7): qty 1

## 2022-09-18 MED ORDER — LABETALOL HCL 5 MG/ML IV SOLN: 5.0000 mg | INTRAVENOUS | Status: DC | PRN

## 2022-09-18 MED ORDER — FUROSEMIDE 40 MG PO TABS: 20.0000 mg | ORAL_TABLET | ORAL | Status: DC

## 2022-09-18 MED ORDER — HYDROCODONE-ACETAMINOPHEN 5-325 MG PO TABS
1.0000 | ORAL_TABLET | Freq: Four times a day (QID) | ORAL | Status: DC | PRN
  Administered 2022-09-18 – 2022-09-19 (×2): 2 via ORAL
  Administered 2022-09-19: 1 via ORAL
  Filled 2022-09-18 (×2): qty 2
  Filled 2022-09-18: qty 1
  Filled 2022-09-18: qty 2

## 2022-09-18 MED ORDER — METOPROLOL SUCCINATE ER 25 MG PO TB24
25.0000 mg | ORAL_TABLET | Freq: Every evening | ORAL | Status: DC
  Administered 2022-09-19 – 2022-09-23 (×3): 25 mg via ORAL
  Filled 2022-09-18 (×5): qty 1

## 2022-09-18 MED ORDER — ONDANSETRON HCL 4 MG/2ML IJ SOLN
INTRAMUSCULAR | Status: AC
  Administered 2022-09-18: 4 mg via INTRAVENOUS
  Filled 2022-09-18: qty 2

## 2022-09-18 NOTE — ED Notes (Signed)
Assumed care pt in NAD but reported pain. Meds given per MAR. Pt alert and oriented x 4 with no further needs. + csm of LLE.

## 2022-09-18 NOTE — ED Notes (Addendum)
Pt back from CT scan. CT tech stated pt vomited while in CT. Pt also had to be placed on 2L [place] after morphine administration. MD made aware.

## 2022-09-18 NOTE — Telephone Encounter (Signed)
Dr. Willette Alma  I think you see her daughter

## 2022-09-18 NOTE — Assessment & Plan Note (Signed)
-   Pravastatin 20 mg daily resumed 

## 2022-09-18 NOTE — Assessment & Plan Note (Addendum)
-   Secondary to mechanical fall - Fall precaution - Status post intramedullary fixation of the left femur fracture POD #2, followed by orthopedics.  Continue pain meds and DVT prophylaxis per Ortho. SNF at discharge.  TOC working with family and insurance.  Likely white [facility] tomorrow

## 2022-09-18 NOTE — H&P (Signed)
History and Physical   Heidi Whitaker JOA:[id number] DOB: 06/11/1929 DOA: 09/18/2022  PCP: McLean-Scocuzza, Nino Glow, MD  Outpatient Specialists: Dr. [name], [facility] cardiology Patient coming from: Home via EMS  I have personally briefly reviewed patient's old medical records in [place].  Chief Concern: Fall  HPI: [name] is a 88 year old female with history of non-insulin-dependent diabetes mellitus, hypertension, hyperlipidemia, persistent atrial fibrillation on Eliquis and digoxin, history of stroke, history of DVT, chronic diarrhea, status post colostomy secondary to benign tumors, who presents emergency department for chief concerns of unwitnessed mechanical fall.  Vitals in the ED showed temperature of 97.7, respiration rate of 18, heart rate of 97, blood pressure 143/102, SPO2 of 94% on room air.  Serum sodium is 138, potassium 3.9, chloride 100, bicarb 27, BUN of 17, serum creatinine 1.16, GFR 45, nonfasting blood glucose 249, WBC 14.2, hemoglobin 14.9, platelets of 242.  Left hip with and without pelvis x-ray was read as: Severely displaced and comminuted proximal left femoral shaft fracture.  Left knee x-ray 2 views: Was read as no acute abnormality seen.  EDP consulted orthopedic, Dr. Mack Guise states that he will consult on the patient.  ED treatment: Morphine 4 mg IV, ondansetron 4 mg IV. ------------------------- Bedside patient was able to tell me her full name, her age, she knows she is in the hospital and she knows the current month is September.  She was not able to tell me the current calendar year.  And she knows that her son-in-law is at bedside.  She does not appear to be in acute distress.  She reports that she was putting on her sweater by squeezing it over her head when suddenly she lost her balance and fell.  She fell on her left side.  She denies loss of consciousness.  At bedside she denies vision changes, pain when she swallows, fever,  chills, nausea, vomiting, chest pain, shortness of breath, abdominal pain, dysuria, hematuria.  She endorses diarrhea in her colostomy bag and states that the color is brown to green.  Social history: She lives at home with her daughter and son-in-law.  She denies history of tobacco, EtOH, recreational drug use.  She is retired and formerly worked as a Scientist, water quality.  ROS: Constitutional: no weight change, no fever ENT/Mouth: no sore throat, no rhinorrhea Eyes: no eye pain, no vision changes Cardiovascular: no chest pain, no dyspnea,  no edema, no palpitations Respiratory: no cough, no sputum, no wheezing Gastrointestinal: no nausea, no vomiting, no diarrhea, no constipation Genitourinary: no urinary incontinence, no dysuria, no hematuria Musculoskeletal: no arthralgias, no myalgias Skin: no skin lesions, no pruritus, Neuro: + weakness, no loss of consciousness, no syncope Psych: no anxiety, no depression, no decrease appetite Heme/Lymph: no bruising, no bleeding  ED Course: Discussed with emergency medicine provider, patient requiring hospitalization for chief concerns of left femoral fracture.  Assessment/Plan  Principal Problem:   Left femoral shaft fracture (HCC) Active Problems:   HTN (hypertension)   Type 2 diabetes mellitus with stage 3 chronic kidney disease, without long-term current use of insulin (HCC)   Hyperlipidemia   Vitamin D deficiency   Leukocytosis   Acute deep vein thrombosis (DVT) of distal vein of left lower extremity (HCC)   Stage 3a chronic kidney disease (HCC)   Hypertension associated with diabetes (HCC)   PAD (peripheral artery disease) (HCC)   Valvular heart disease   Colostomy in place [facility])   Persistent atrial fibrillation (HCC)   Assessment and Plan:  *  Left femoral shaft fracture (HCC) - Secondary to mechanical fall - Fall precaution - Pain control: Hydrocodone-acetaminophen 5-325 mg tablet, 1 to 2 tablets p.o. every 6 hours as needed for moderate  pain; morphine 0.5 mg IV every 2 hours as needed for severe pain - Orthopedic service has been consulted and Dr. Mack Guise states he will see the patient - Holding home Eliquis - Admit to telemetry medical, inpatient  Persistent atrial fibrillation (HCC) - Metoprolol succinate 25-50 mg dosing resumed - Holding home Eliquis 5 mg p.o. twice daily at this time due to fracture requiring surgery - Patient last took Eliquis 5 mg p.o. on a.m., 09/18/2022 prior to ED presentation - Digoxin 0.0625 mg daily resumed - Metoprolol tartrate 2.5 mg IV every 2 hours as needed for heart rate greater than 120, 4 doses ordered  Colostomy in place [facility]) - Wound/colostomy consultation placed  Stage 3a chronic kidney disease ([place]) - At baseline  Acute deep vein thrombosis (DVT) of distal vein of left lower extremity ([place]) - History of left DVT in June 2020 - Patient is on Eliquis 5 mg p.o. twice daily  Leukocytosis - Etiology work-up in progress, differentials include reactive secondary to left femoral fracture versus pneumonia - Check procalcitonin-stat portable chest x-ray ordered  Hyperlipidemia - Pravastatin 20 mg daily resumed  HTN (hypertension) - Metoprolol succinate 25 mg tablet, patient taking 50 mg in the morning and 25 mg in the p.m., this has been resumed per outpatient therapy - Furosemide 20-40 mg tablet every other day - Labetalol 5 mg IV every 3 hours as needed for SBP greater than 180, 4 doses ordered  EKG reading atrial fibrillation with rate of 85, QTc 504, left bundle branch block.  I discussed with cardiologist who states that this is not a new left bundle branch block and is instead interventricular conduction delay.  It is only slightly different because of lead placement otherwise it is the same as her previous 5-year EKG reading.  She has declined ischemic work-up in the past.  No further cardiac work-up indicated at this time.  DVT prophylaxis-I have initiated patient on  heparin 5000 units subcutaneous every 8 hours as patient will not be able to get orthopedic procedure on day of admission or the next day due to Eliquis - AM team to discontinue heparin 5000 units once orthopedic service is ready for patient's surgery - Patient has history of left lower extremity DVT therefore pharmacologic DVT prophylaxis will need to be initiated when the benefits outweigh the risk  Chart reviewed.   DVT prophylaxis: Heparin 5000 units subcutaneous every 8 hours Code Status: DNR Diet: Heart healthy Family Communication: Updated daughter over the phone and son-in-law at bedside with patient's permission Disposition Plan: Pending clinical course Consults called: Orthopedic surgery Admission status: Telemetry medical, inpatient  Past Medical History:  Diagnosis Date   Arrhythmia    atrial fibrillation   CHF (congestive heart failure) ([place])    Complication of anesthesia    Diabetes mellitus without complication ([place])    DVT (deep venous thrombosis) ([place])    06/02/2019   Hard of hearing    Hypertension    PAD (peripheral artery disease) ([place])    Past Surgical History:  Procedure Laterality Date   CESAREAN SECTION     COLONOSCOPY N/A 05/17/2019   Procedure: FLEXIBLE SIGMOIDOSCOPY;  Surgeon: Ileana Roup, MD;  [facility];  Service: General;  Laterality: N/A;   COLONOSCOPY WITH PROPOFOL N/A 01/09/2019   Procedure: COLONOSCOPY WITH PROPOFOL;  Surgeon: Lin Landsman, MD;  Location: [facility] ENDOSCOPY;  Service: Gastroenterology;  Laterality: N/A;   ESOPHAGOGASTRODUODENOSCOPY (EGD) WITH PROPOFOL N/A 01/09/2019   Procedure: ESOPHAGOGASTRODUODENOSCOPY (EGD) WITH PROPOFOL;  Surgeon: Lin Landsman, MD;  Location: [facility] ENDOSCOPY;  Service: Gastroenterology;  Laterality: N/A;   KNEE SURGERY     left 2017/2018   LOWER EXTREMITY ANGIOGRAPHY Left 05/29/2021   Procedure: LOWER EXTREMITY ANGIOGRAPHY;  Surgeon: Algernon Huxley, MD;  Location: [place] CV LAB;   Service: Cardiovascular;  Laterality: Left;   LOWER EXTREMITY ANGIOGRAPHY Right 06/05/2021   Procedure: LOWER EXTREMITY ANGIOGRAPHY;  Surgeon: Algernon Huxley, MD;  Location: [place] CV LAB;  Service: Cardiovascular;  Laterality: Right;   SHOULDER SURGERY     right   XI ROBOTIC ASSISTED LOWER ANTERIOR RESECTION N/A 05/17/2019   Procedure: XI ROBOTIC ASSISTED LOWER ANTERIOR RESECTION WITH END COLOSTOMY;  Surgeon: Ileana Roup, MD;  [facility];  Service: General;  Laterality: N/A;   Social History:  reports that she has quit smoking. Her smoking use included cigarettes. She has never used smokeless tobacco. She reports that she does not drink alcohol and does not use drugs.  No Known Allergies Family History  Problem Relation Age of Onset   Addison's disease Maternal Aunt    Heart disease Mother    Heart disease Father    Family history: Family history reviewed and not pertinent  Prior to Admission medications   Medication Sig Start Date End Date Taking? Authorizing Provider  apixaban (ELIQUIS) 5 MG TABS tablet Take 1 tablet (5 mg total) by mouth 2 (two) times daily. 08/10/22   McLean-Scocuzza, Nino Glow, MD  Cholecalciferol (D3-1000) 25 MCG (1000 UT) capsule Take 2 capsules (2,000 Units total) by mouth daily. 08/10/22   McLean-Scocuzza, Nino Glow, MD  digoxin (LANOXIN) 0.125 MG tablet Take 0.5 tablets (0.0625 mg total) by mouth daily. 08/10/22   McLean-Scocuzza, Nino Glow, MD  furosemide (LASIX) 40 MG tablet Take 1 tablet (40 mg total) by mouth as directed. Alternates between '40mg'$  and '20mg'$  QOD 08/10/22   McLean-Scocuzza, Nino Glow, MD  glipiZIDE (GLUCOTROL XL) 2.5 MG 24 hr tablet Take 1 tablet (2.5 mg total) by mouth daily with breakfast. 08/10/22   McLean-Scocuzza, Nino Glow, MD  lovastatin (MEVACOR) 20 MG tablet TAKE 1 TABLET DAILY AT [facility] 08/10/22   McLean-Scocuzza, Nino Glow, MD  metoprolol succinate (TOPROL-XL) 50 MG 24 hr tablet Take 1 tablet (50 mg) in the AM and 1/2 tablet (25 mg) in the  PM 08/10/22   McLean-Scocuzza, Nino Glow, MD  potassium chloride (KLOR-CON M10) 10 MEQ tablet TAKE 2 TABLETS TWICE A DAY 08/10/22   McLean-Scocuzza, Nino Glow, MD   Physical Exam: Vitals:   09/18/22 1131 09/18/22 1143 09/18/22 1201 09/18/22 1510  BP:  (!) 143/102 (!) 143/102 116/82  Pulse:      Resp:    16  Temp:      TempSrc:      SpO2:      Weight: 80.1 kg      Constitutional: appears age-appropriate, frail, NAD, calm, comfortable Eyes: PERRL, lids and conjunctivae normal ENMT: Mucous membranes are moist. Posterior pharynx clear of any exudate or lesions. Age-appropriate dentition. Hearing appropriate Neck: normal, supple, no masses, no thyromegaly Respiratory: clear to auscultation bilaterally, no wheezing, no crackles. Normal respiratory effort. No accessory muscle use.  Cardiovascular: Regular rate and rhythm, no murmurs / rubs / gallops. No extremity edema. 2+ pedal pulses. No carotid bruits.  Abdomen: no tenderness, no  masses palpated, no hepatosplenomegaly. Bowel sounds positive.  Musculoskeletal: no clubbing / cyanosis. No joint deformity upper and lower extremities. Good ROM, no contractures, no atrophy. Normal muscle tone.  Left hip pain Skin: no rashes, lesions, ulcers. No induration Neurologic: Sensation intact. Strength 5/5 in all 4.  Psychiatric: Normal judgment and insight. Alert and oriented x 3. Normal mood.   EKG: independently reviewed, showing atrial fibrillation with rate of 85, QTc 504  Chest x-ray on Admission: I personally reviewed and I agree with radiologist reading as below.  DG Chest Port 1 View  Result Date: 09/18/2022 CLINICAL DATA:  Preop chest. EXAM: PORTABLE CHEST 1 VIEW COMPARISON:  One-view chest x-ray 10/31/2021 FINDINGS: Atherosclerotic changes are present at the aortic arch. Heart is mildly enlarged, exaggerated by low lung volumes. Lungs are clear. No edema or effusion is present. Right shoulder hemiarthroplasty noted. IMPRESSION: No acute  cardiopulmonary disease. Electronically Signed   By: [place] M.D.   On: 09/18/2022 14:39   DG Hip Unilat W or Wo Pelvis 2-3 Views Left  Result Date: 09/18/2022 CLINICAL DATA:  Unwitnessed fall. EXAM: DG HIP (WITH OR WITHOUT PELVIS) 2-3V LEFT COMPARISON:  None Available. FINDINGS: Severely displaced and comminuted fracture is seen involving the proximal left femoral shaft. No significant abnormality seen involving the hip joints. IMPRESSION: Severely displaced and comminuted proximal left femoral shaft fracture. Electronically Signed   By: Marijo Conception M.D.   On: 09/18/2022 12:41   DG Knee 2 Views Left  Result Date: 09/18/2022 CLINICAL DATA:  Left knee pain after unwitnessed fall. EXAM: LEFT KNEE - 1-2 VIEW COMPARISON:  None Available. FINDINGS: Status post left total knee arthroplasty. No definite fracture or dislocation is noted. Vascular calcifications are noted. IMPRESSION: No acute abnormality seen. Electronically Signed   By: Marijo Conception M.D.   On: 09/18/2022 12:40    Labs on Admission: I have personally reviewed following labs  CBC: Recent Labs  Lab 09/18/22 1133  WBC 14.2*  NEUTROABS 8.4*  HGB 14.9  HCT 46.0  MCV 95.0  PLT 233   Basic Metabolic Panel: Recent Labs  Lab 09/18/22 1133  NA 138  K 3.9  CL 100  CO2 27  GLUCOSE 249*  BUN 17  CREATININE 1.16*  CALCIUM 9.7   GFR: Estimated Creatinine Clearance: 33 mL/min (A) (by C-G formula based on SCr of 1.16 mg/dL (H)).  Urine analysis:    Component Value Date/Time   COLORURINE STRAW (A) 05/14/2021 1806   APPEARANCEUR CLEAR (A) 05/14/2021 1806   LABSPEC 1.004 (L) 05/14/2021 1806   PHURINE 7.0 05/14/2021 1806   GLUCOSEU NEGATIVE 05/14/2021 1806   HGBUR NEGATIVE 05/14/2021 1806   BILIRUBINUR NEGATIVE 05/14/2021 1806   KETONESUR NEGATIVE 05/14/2021 1806   PROTEINUR NEGATIVE 05/14/2021 1806   NITRITE NEGATIVE 05/14/2021 1806   LEUKOCYTESUR SMALL (A) 05/14/2021 1806   Dr. Tobie Poet [facility]  If 7PM-7AM, please contact overnight-coverage provider If 7AM-7PM, please contact day coverage provider [email or web address]  09/18/2022, 3:40 PM

## 2022-09-18 NOTE — Progress Notes (Signed)
Initial Nutrition Assessment  DOCUMENTATION CODES:   Obesity unspecified  INTERVENTION:   Once diet is advanced, add:  -Ensure Enlive po BID, each supplement provides 350 kcal and 20 grams of protein -MVI with minerals daily  NUTRITION DIAGNOSIS:   Increased nutrient needs related to post-op healing as evidenced by estimated needs.  GOAL:   Patient will meet greater than or equal to 90% of their needs  MONITOR:   PO intake, Supplement acceptance  REASON FOR ASSESSMENT:   Consult Assessment of nutrition requirement/status, Hip fracture protocol  ASSESSMENT:   Pt with PMH of DM, DVT, CHF, and PAD admitted with lt hip fracture s/p mechanical fall.  Pt admitted with lt hip fracture s/p unwitnessed mechanical fall.   Pt unavailable at time of visit. Attempted to speak with pt via call to hospital room phone, however, unable to reach. RD unable to obtain further nutrition-related history or complete nutrition-focused physical exam at this time.    Pt is currently NPO for potential procedure.   Reviewed wt hx; no wt loss noted over the past 6 months.   Pt with increased nutritional needs for post-op healing and would benefit from addition of oral nutrition supplements.   Medications reviewed.   Labs reviewed: Mg: 2.5, CBGS: 180.   Diet Order:   Diet Order     None       EDUCATION NEEDS:   No education needs have been identified at this time  Skin:  Skin Assessment: Reviewed RN Assessment  Last BM:  Unknown  Height:   Ht Readings from Last 1 Encounters:  08/25/22 '5\' 4"'$  (1.626 m)    Weight:   Wt Readings from Last 1 Encounters:  09/18/22 80.1 kg    Ideal Body Weight:  54.5 kg  BMI:  Body mass index is 30.31 kg/m.  Estimated Nutritional Needs:   Kcal:  1650-1850  Protein:  80-95 grams  Fluid:  > 1.6 L    Loistine Chance, RD, LDN, [place] Registered Dietitian II Certified Diabetes Care and Education Specialist Please refer to [facility] for RD  and/or RD on-call/weekend/after hours pager

## 2022-09-18 NOTE — Assessment & Plan Note (Signed)
At baseline 

## 2022-09-18 NOTE — Consult Note (Signed)
PREOPERATIVE H&P  Chief Complaint: Left femur fracture  HPI: Heidi Whitaker is a 88 y.o. female with a history of diabetes and atrial fibrillation on Eliquis who presented to the ER today after an unwitnessed fall.  X-rays in the ER demonstrated a comminuted displaced left proximal femoral shaft fracture.  Patient was admitted to the hospital service and the previous is consulted for management of her femur fracture.  The bedside this evening the patient is complaining of left thigh pain.   Past Medical History:  Diagnosis Date   Arrhythmia    atrial fibrillation   CHF (congestive heart failure) (HCC)    Complication of anesthesia    Diabetes mellitus without complication ([place])    DVT (deep venous thrombosis) ([place])    06/02/2019   Hard of hearing    Hypertension    PAD (peripheral artery disease) ([place])    Past Surgical History:  Procedure Laterality Date   CESAREAN SECTION     COLONOSCOPY N/A 05/17/2019   Procedure: FLEXIBLE SIGMOIDOSCOPY;  Surgeon: Ileana Roup, MD;  Location: [facility];  Service: General;  Laterality: N/A;   COLONOSCOPY WITH PROPOFOL N/A 01/09/2019   Procedure: COLONOSCOPY WITH PROPOFOL;  Surgeon: Lin Landsman, MD;  Location: [place];  Service: Gastroenterology;  Laterality: N/A;   ESOPHAGOGASTRODUODENOSCOPY (EGD) WITH PROPOFOL N/A 01/09/2019   Procedure: ESOPHAGOGASTRODUODENOSCOPY (EGD) WITH PROPOFOL;  Surgeon: Lin Landsman, MD;  Location: [facility];  Service: Gastroenterology;  Laterality: N/A;   KNEE SURGERY     left 2017/2018   LOWER EXTREMITY ANGIOGRAPHY Left 05/29/2021   Procedure: LOWER EXTREMITY ANGIOGRAPHY;  Surgeon: Algernon Huxley, MD;  Location: [facility];  Service: Cardiovascular;  Laterality: Left;   LOWER EXTREMITY ANGIOGRAPHY Right 06/05/2021   Procedure: LOWER EXTREMITY ANGIOGRAPHY;  Surgeon: Algernon Huxley, MD;  Location: [place];  Service: Cardiovascular;  Laterality: Right;   SHOULDER SURGERY      right   XI ROBOTIC ASSISTED LOWER ANTERIOR RESECTION N/A 05/17/2019   Procedure: XI ROBOTIC ASSISTED LOWER ANTERIOR RESECTION WITH END COLOSTOMY;  Surgeon: Ileana Roup, MD;  Location: [facility];  Service: General;  Laterality: N/A;   Social History   Socioeconomic History   Marital status: Widowed    Spouse name: Not on file   Number of children: Not on file   Years of education: Not on file   Highest education level: Not on file  Occupational History   Not on file  Tobacco Use   Smoking status: Former    Years: 4.00    Types: Cigarettes   Smokeless tobacco: Never   Tobacco comments:    3 per day  Vaping Use   Vaping Use: Never used  Substance and Sexual Activity   Alcohol use: No   Drug use: No   Sexual activity: Not Currently  Other Topics Concern   Not on file  Social History Narrative   Retired Pharmacist, hospital    2 daughters lives with [name]    1 son deceased    Widowed    Moved from [place] Strain: [place]  (11/26/2021)   Overall Financial Resource Strain ([place])    Difficulty of Paying Living Expenses: Not hard at all  Food Insecurity: No [place] (11/26/2021)   Hunger Vital Sign    Worried About Running Out of Food in the Last Year: Never true    [place] in the Last  Year: Never true  Transportation Needs: No Transportation Needs (11/26/2021)   PRAPARE - Hydrologist (Medical): No    Lack of Transportation (Non-Medical): No  Physical Activity: Insufficiently Active (11/26/2021)   Exercise Vital Sign    Days of Exercise per Week: 3 days    Minutes of Exercise per Session: 20 min  Stress: No Stress Concern Present (11/26/2021)   [place]    Feeling of Stress : Not at all  Social Connections: Unknown (11/26/2021)   Social Connection and Isolation Panel [NHANES]    Frequency of Communication with  Friends and Family: More than three times a week    Frequency of Social Gatherings with Friends and Family: More than three times a week    Attends Religious Services: Not on Advertising copywriter or Organizations: Not on file    Attends Archivist Meetings: Not on file    Marital Status: Not on file   Family History  Problem Relation Age of Onset   Addison's disease Maternal Aunt    Heart disease Mother    Heart disease Father    No Known Allergies Prior to Admission medications   Medication Sig Start Date End Date Taking? Authorizing Provider  apixaban (ELIQUIS) 5 MG TABS tablet Take 1 tablet (5 mg total) by mouth 2 (two) times daily. 08/10/22  Yes McLean-Scocuzza, Nino Glow, MD  Cholecalciferol (D3-1000) 25 MCG (1000 UT) capsule Take 2 capsules (2,000 Units total) by mouth daily. 08/10/22  Yes McLean-Scocuzza, Nino Glow, MD  digoxin (LANOXIN) 0.125 MG tablet Take 0.5 tablets (0.0625 mg total) by mouth daily. 08/10/22  Yes McLean-Scocuzza, Nino Glow, MD  furosemide (LASIX) 40 MG tablet Take 1 tablet (40 mg total) by mouth as directed. Alternates between '40mg'$  and '20mg'$  QOD 08/10/22  Yes McLean-Scocuzza, Nino Glow, MD  glipiZIDE (GLUCOTROL XL) 2.5 MG 24 hr tablet Take 1 tablet (2.5 mg total) by mouth daily with breakfast. 08/10/22  Yes McLean-Scocuzza, Nino Glow, MD  lovastatin (MEVACOR) 20 MG tablet TAKE 1 TABLET DAILY AT 6PM 08/10/22  Yes McLean-Scocuzza, Nino Glow, MD  metoprolol succinate (TOPROL-XL) 50 MG 24 hr tablet Take 1 tablet (50 mg) in the AM and 1/2 tablet (25 mg) in the PM 08/10/22  Yes McLean-Scocuzza, Nino Glow, MD  potassium chloride (KLOR-CON M10) 10 MEQ tablet TAKE 2 TABLETS TWICE A DAY 08/10/22  Yes McLean-Scocuzza, Nino Glow, MD     Positive ROS: All other systems have been reviewed and were otherwise negative with the exception of those mentioned in the HPI and as above.  Physical Exam: General: Alert, no acute distress  MUSCULOSKELETAL: Left lower extremity: Patient's  skin is intact.  There is swelling of the left thigh but her compartments are soft and compressible.  There is no erythema or ecchymosis seen.  Patient is externally rotated and shortened.  Distally she is neurovascular intact.  Assessment: Comminuted, displaced left proximal femoral shaft fracture  Plan: I reviewed the patient's x-rays.  She will require surgical intervention for fixation of this fracture given its displacement and comminution.  Patient is on Eliquis and her last dose was this morning.  Patient will undergo open reduction and intramedullary fixation of the fracture.  I explained the plan to the patient.  The patient also requested I speak with her daughter.  I spoke with her daughter, Mrs. Lovenia Shuck, by phone this evening.  I reviewed the details of  the patient's injury as well as the proposed surgical treatment.  She understands that surgery needs to be delayed until the Eliquis is out of the patient's system.  Patient has been covered with heparin per the hospitalist service until surgery.  Answered all questions by the patient and her daughter.  They are in agreement with the above-noted surgical plan.  I going to order Buck's traction to maintain adequate length of the patient's left lower extremity until surgery.  Patient will continue with narcotic pain medication as needed.   Thornton Park, MD   09/18/2022 6:27 PM

## 2022-09-18 NOTE — Assessment & Plan Note (Addendum)
-   Metoprolol succinate 25 mg tablet, patient taking 50 mg in the morning and 25 mg in the p.m., this has been resumed per outpatient therapy - Furosemide 20-40 mg tablet every other day - Labetalol 5 mg IV every 3 hours as needed for SBP greater than 180, 4 doses ordered

## 2022-09-18 NOTE — Assessment & Plan Note (Addendum)
-   History of left DVT in June 2020 - Patient is on Eliquis 5 mg p.o. twice daily

## 2022-09-18 NOTE — Assessment & Plan Note (Addendum)
-   Metoprolol succinate 25-50 mg dosing resumed - Holding home Eliquis 5 mg p.o. twice daily at this time due to fracture requiring surgery - Patient last took Eliquis 5 mg p.o. on a.m., 09/18/2022 prior to ED presentation - Digoxin 0.0625 mg daily resumed - Metoprolol tartrate 2.5 mg IV every 2 hours as needed for heart rate greater than 120, 4 doses ordered

## 2022-09-18 NOTE — ED Triage Notes (Signed)
Pt to ED via [facility] from home. Pt had unwitenssed mechanical fall. Pt was trying to put on her sweater and fell backwards. Pt denies LOC or head trauma. PT unsure if she is on blood thinners. EMS states pt did vomit once PTA. Pt c/o left leg pain. Pt denies HA

## 2022-09-18 NOTE — Telephone Encounter (Signed)
[name] called to let Dr Aundra Dubin and Dr Nicki Reaper, that will be patients provider in October know; Patient fell and broke her hip, she is at [facility] and she has broken her femur and will have surgery.

## 2022-09-18 NOTE — ED Provider Notes (Signed)
[facility] Provider Note    Event Date/Time   First MD Initiated Contact with Patient 09/18/22 1129     (approximate)   History   Chief Complaint Fall   HPI  Heidi Whitaker is a 88 y.o. female with past medical history of hypertension, hyperlipidemia, diabetes, atrial fibrillation on Eliquis, DVT, CKD, CHF, and stroke who presents to the ED following fall.  Patient reports that she was trying to put on her sweater when she states that she turned around too fast, causing her to lose her balance.  She does not think she hit her head and denies any loss of consciousness.  She does report hitting her left hip with significant pain just below the hip since then.  She has been unable to bear weight on her left leg, denies any pain in her right leg or her upper extremities.  She is unsure of her last dose of Eliquis.     Physical Exam   Triage Vital Signs: ED Triage Vitals  Enc Vitals Group     BP --      Pulse Rate 09/18/22 1130 97     Resp 09/18/22 1130 18     Temp 09/18/22 1130 97.7 F (36.5 C)     Temp Source 09/18/22 1130 Oral     SpO2 09/18/22 1130 94 %     Weight 09/18/22 1131 176 lb 9.4 oz (80.1 kg)     Height --      Head Circumference --      Peak Flow --      Pain Score 09/18/22 1130 4     Pain Loc --      Pain Edu? --      Excl. in [place]? --     Most recent vital signs: Vitals:   09/18/22 1201 09/18/22 1510  BP: (!) 143/102 116/82  Pulse:    Resp:  16  Temp:    SpO2:      Constitutional: Alert and oriented. Eyes: Conjunctivae are normal. Head: Atraumatic. Nose: No congestion/rhinnorhea. Mouth/Throat: Mucous membranes are moist.  Neck: No midline cervical spine tenderness to palpation. Cardiovascular: Normal rate, regular rhythm. Grossly normal heart sounds.  2+ radial and DP pulses bilaterally. Respiratory: Normal respiratory effort.  No retractions. Lungs CTAB.  No chest wall tenderness to palpation. Gastrointestinal: Soft  and nontender. No distention. Musculoskeletal: Diffuse tenderness to palpation at left hip and proximal femur with no obvious deformity.  Tenderness noted at left knee, no left ankle tenderness.  No tenderness to palpation noted over right lower extremity or bilateral upper extremities. Neurologic:  Normal speech and language. No gross focal neurologic deficits are appreciated.    ED Results / Procedures / Treatments   Labs (all labs ordered are listed, but only abnormal results are displayed) Labs Reviewed  CBC WITH DIFFERENTIAL/PLATELET - Abnormal; Notable for the following components:      Result Value   WBC 14.2 (*)    Neutro Abs 8.4 (*)    Monocytes Absolute 1.5 (*)    All other components within normal limits  BASIC METABOLIC PANEL - Abnormal; Notable for the following components:   Glucose, Bld 249 (*)    Creatinine, Ser 1.16 (*)    GFR, Estimated 45 (*)    All other components within normal limits  PROCALCITONIN  TROPONIN I (HIGH SENSITIVITY)     EKG  ED ECG REPORT I, Blake Divine, the attending physician, personally viewed and interpreted this ECG.  Date: 09/18/2022  EKG Time: 12:45  Rate: 85  Rhythm: atrial fibrillation  Axis: LAD  Intervals:left bundle branch block  ST&T Change: None  RADIOLOGY Left hip x-ray reviewed and interpreted by me with comminuted and displaced fracture of the femoral shaft just distal to the greater trochanter.  PROCEDURES:  Critical Care performed: No  Procedures   MEDICATIONS ORDERED IN ED: Medications  HYDROcodone-acetaminophen (NORCO/VICODIN) 5-325 MG per tablet 1-2 tablet (has no administration in time range)  morphine (PF) 2 MG/ML injection 0.5 mg (0.5 mg Intravenous Given 09/18/22 1517)  heparin injection 5,000 Units (5,000 Units Subcutaneous Given 09/18/22 1515)  senna-docusate (Senokot-S) tablet 1 tablet (has no administration in time range)  polyethylene glycol (MIRALAX / GLYCOLAX) packet 17 g (has no  administration in time range)  multivitamin with minerals tablet 1 tablet (has no administration in time range)  feeding supplement (ENSURE ENLIVE / ENSURE PLUS) liquid 237 mL (has no administration in time range)  digoxin (LANOXIN) tablet 0.0625 mg (has no administration in time range)  furosemide (LASIX) tablet 20-40 mg (has no administration in time range)  pravastatin (PRAVACHOL) tablet 20 mg (has no administration in time range)  metoprolol succinate (TOPROL-XL) 24 hr tablet 25 mg (has no administration in time range)  Cholecalciferol 2,000 Units (has no administration in time range)  labetalol (NORMODYNE) injection 5 mg (has no administration in time range)  metoprolol tartrate (LOPRESSOR) injection 2.5 mg (has no administration in time range)  morphine (PF) 4 MG/ML injection 4 mg (4 mg Intravenous Given 09/18/22 1201)  ondansetron (ZOFRAN) injection 4 mg (4 mg Intravenous Given 09/18/22 1330)     IMPRESSION / MDM / ASSESSMENT AND PLAN / ED COURSE  I reviewed the triage vital signs and the nursing notes.                              88 y.o. female with past medical history of hypertension, hyperlipidemia, diabetes, atrial fibrillation on Eliquis, DVT, CKD, CHF, and stroke who presents to the ED following episode at home where she lost her balance and fell, striking her left hip.  Patient's presentation is most consistent with acute presentation with potential threat to life or bodily function.  Differential diagnosis includes, but is not limited to, hip fracture, femur fracture, knee injury, intracranial injury, cervical spine injury.  Patient nontoxic-appearing and in no acute distress, vital signs are unremarkable and she is neurovascular intact to her distal left lower extremity.  She has significant tenderness at her left hip and knee, we will further assess with x-ray.  She denies hitting her head or losing consciousness, no evidence of traumatic injury to her head on exam and we  will hold off on CT imaging.  Plan to treat pain with IV morphine and reassess following imaging.  X-rays of left knee are unremarkable, x-rays of left hip show femoral shaft fracture just distal to the left greater trochanter.  Case discussed with Dr. Mack Guise of orthopedics, who recommends admission to medicine service, will plan for operative intervention once Eliquis washes out of her system.  Basic labs remarkable for mild leukocytosis and stable chronic kidney disease.  Patient's pain improved following IV morphine, case discussed with hospitalist for admission.      FINAL CLINICAL IMPRESSION(S) / ED DIAGNOSES   Final diagnoses:  Closed displaced comminuted fracture of shaft of left femur, initial encounter ([place])     Rx / DC Orders   ED Discharge  Orders     None        Note:  This document was prepared using Dragon voice recognition software and may include unintentional dictation errors.   Blake Divine, MD 09/18/22 [phone]

## 2022-09-18 NOTE — Hospital Course (Addendum)
Ms. Joscelyn Hardrick is a 88 year old female with history of non-insulin-dependent diabetes mellitus, hypertension, hyperlipidemia, persistent atrial fibrillation on Eliquis and digoxin, history of stroke, history of DVT, chronic diarrhea, status post colostomy secondary to benign tumors, who presents emergency department for chief concerns of unwitnessed mechanical fall. Left hip with and without pelvis x-ray was read as: Severely displaced and comminuted proximal left femoral shaft fracture. Surgery on 9/25. Postoperatively, patient developed some volume overload, increased confusion and agitation. Discharged to nursing home when mental status improves.  May consider hospice if patient does not improve with mental status or does not eat.  9/27: TOC working with family for placement to SNF likely tomorrow 9/28: Family pursuing hospice now.  Waiting for hospice home screening

## 2022-09-18 NOTE — Assessment & Plan Note (Signed)
-   Wound/colostomy consultation placed

## 2022-09-18 NOTE — Assessment & Plan Note (Signed)
-   Etiology work-up in progress, differentials include reactive secondary to left femoral fracture versus pneumonia - Check procalcitonin-stat portable chest x-ray ordered

## 2022-09-19 DIAGNOSIS — N1831 Chronic kidney disease, stage 3a: Secondary | ICD-10-CM | POA: Diagnosis not present

## 2022-09-19 DIAGNOSIS — I4819 Other persistent atrial fibrillation: Secondary | ICD-10-CM | POA: Diagnosis not present

## 2022-09-19 DIAGNOSIS — S72352A Displaced comminuted fracture of shaft of left femur, initial encounter for closed fracture: Secondary | ICD-10-CM | POA: Diagnosis not present

## 2022-09-19 LAB — CBC
HCT: 39.7 % (ref 36.0–46.0)
Hemoglobin: 12.9 g/dL (ref 12.0–15.0)
MCH: 30.7 pg (ref 26.0–34.0)
MCHC: 32.5 g/dL (ref 30.0–36.0)
MCV: 94.5 fL (ref 80.0–100.0)
Platelets: 215 10*3/uL (ref 150–400)
RBC: 4.2 MIL/uL (ref 3.87–5.11)
RDW: 13.5 % (ref 11.5–15.5)
WBC: 15.8 10*3/uL — ABNORMAL HIGH (ref 4.0–10.5)
nRBC: 0 % (ref 0.0–0.2)

## 2022-09-19 LAB — BASIC METABOLIC PANEL
Anion gap: 9 (ref 5–15)
BUN: 18 mg/dL (ref 8–23)
CO2: 26 mmol/L (ref 22–32)
Calcium: 9.4 mg/dL (ref 8.9–10.3)
Chloride: 102 mmol/L (ref 98–111)
Creatinine, Ser: 1.12 mg/dL — ABNORMAL HIGH (ref 0.44–1.00)
GFR, Estimated: 47 mL/min — ABNORMAL LOW (ref >60)
Glucose, Bld: 256 mg/dL — ABNORMAL HIGH (ref 70–99)
Potassium: 4.3 mmol/L (ref 3.5–5.1)
Sodium: 137 mmol/L (ref 135–145)

## 2022-09-19 MED ORDER — DILTIAZEM HCL 25 MG/5ML IV SOLN
5.0000 mg | Freq: Once | INTRAVENOUS | Status: AC
  Administered 2022-09-19: 5 mg via INTRAVENOUS
  Filled 2022-09-19: qty 5

## 2022-09-19 MED ORDER — LORAZEPAM 2 MG/ML IJ SOLN
0.5000 mg | Freq: Three times a day (TID) | INTRAMUSCULAR | Status: DC | PRN
  Administered 2022-09-20 – 2022-09-24 (×3): 0.5 mg via INTRAVENOUS
  Filled 2022-09-19 (×4): qty 1

## 2022-09-19 NOTE — Progress Notes (Signed)
1020 Per monitor tech. Pt is reading afib HR 106.

## 2022-09-19 NOTE — Plan of Care (Signed)

## 2022-09-19 NOTE — Progress Notes (Signed)
Nurse administered Metoprolol 2.'5mg'$  for HR that was sustained over 120 bpm.

## 2022-09-19 NOTE — Progress Notes (Signed)
Patient's MEWs is yellow due to heart rate above 120 and sustained above 120.  2.5 of Metoprolol was administered.  Dr. Sidney Ace and charge nurse were made aware.  Nurse will recheck vital signs in one hour, as per protocol.

## 2022-09-19 NOTE — Plan of Care (Signed)
  Problem: Clinical Measurements: Goal: Ability to maintain clinical measurements within normal limits will improve Outcome: Progressing   Problem: Activity: Goal: Risk for activity intolerance will decrease Outcome: Progressing   Problem: Pain Managment: Goal: General experience of comfort will improve Outcome: Progressing   

## 2022-09-19 NOTE — Progress Notes (Signed)
Nursing staff could not locate but only one hearing aid when patient reports having 2 and was actively looking for the 2nd one. On assessment at the beginning of the shift the nurse noted that the patient only had one hearing aid present in the right ear only.   Nurse will pass off to the day shift nurse for their awareness.

## 2022-09-19 NOTE — Progress Notes (Signed)
  Progress Note   Patient: Heidi Whitaker ZOX:[id number] DOB: 23-Mar-1929 DOA: 09/18/2022     1 DOS: the patient was seen and examined on 09/19/2022   Brief hospital course: [name] is a 88 year old female with history of non-insulin-dependent diabetes mellitus, hypertension, hyperlipidemia, persistent atrial fibrillation on Eliquis and digoxin, history of stroke, history of DVT, chronic diarrhea, status post colostomy secondary to benign tumors, who presents emergency department for chief concerns of unwitnessed mechanical fall. Left hip with and without pelvis x-ray was read as: Severely displaced and comminuted proximal left femoral shaft fracture.    Assessment and Plan: * Left femoral shaft fracture ([place]) Mechanical fall Patient is seen by orthopedics, recommended surgery with internal fixation.  However, patient last dose of Eliquis was yesterday, surgery is on hold for now.  Persistent atrial fibrillation (HCC) Acute deep vein thrombosis (DVT) of distal vein of left lower extremity ([place]) Hold off anticoagulation, use heparin for prophylaxis. Continue rate control for atrial fibrillation.  Colostomy in place [facility]) Continue to follow.  Stage 3a chronic kidney disease (HCC) Stable.  HTN (hypertension) Continue home medicines.       Subjective:  Patient is confused as in baseline.  She is complaining of pain in her leg, denies any short of breath.  She was eating breakfast, she seems to have appetite.  Physical Exam: Vitals:   09/18/22 2349 09/19/22 0444 09/19/22 0653 09/19/22 0852  BP: (!) 113/98 (!) 136/115 102/62 108/84  Pulse: (!) 101 88 95 (!) 43  Resp: '16 16  16  '$ Temp: 97.7 F (36.5 C) 97.6 F (36.4 C)  98.2 F (36.8 C)  TempSrc:    Oral  SpO2: 93% 95%  99%  Weight:      Height:       General exam: Appears calm and comfortable  Respiratory system: Clear to auscultation. Respiratory effort normal. Cardiovascular system: Irregularly  irregular. No JVD, murmurs, rubs, gallops or clicks. No pedal edema. Gastrointestinal system: Abdomen is nondistended, soft and nontender. No organomegaly or masses felt. Normal bowel sounds heard. Central nervous system: Alert and oriented x2. No focal neurological deficits. Extremities: Symmetric 5 x 5 power. Skin: No rashes, lesions or ulcers Psychiatry: Mood & affect appropriate.   Data Reviewed:  X-ray and lab results reviewed.  Family Communication:   Disposition: Status is: Inpatient Remains inpatient appropriate because: Severity of disease, pending inpatient procedure.  Planned Discharge Destination: Skilled nursing facility    Time spent: 35 minutes  Author: Sharen Hones, MD 09/19/2022 2:14 PM  For on call review [email or web address].

## 2022-09-19 NOTE — Progress Notes (Signed)
Subjective:  Patient with a left comminuted proximal femoral shaft fracture.  Presented on Eliquis.  Surgery is delayed until Monday due to Eliquis.  Patient currently in Buck's traction to maintain adequate left lower extremity alignment.  She has both hearing aids in today but is unable to hear at all.  She could not understand what I was trying to tell her.  Objective:   VITALS:   Vitals:   09/18/22 2349 09/19/22 0444 09/19/22 0653 09/19/22 0852  BP: (!) 113/98 (!) 136/115 102/62 108/84  Pulse: (!) 101 88 95 (!) 43  Resp: '16 16  16  '$ Temp: 97.7 F (36.5 C) 97.6 F (36.4 C)  98.2 F (36.8 C)  TempSrc:    Oral  SpO2: 93% 95%  99%  Weight:      Height:        PHYSICAL EXAM: Left lower extremity: Neurovascular intact Sensation intact distally Intact pulses distally Dorsiflexion/Plantar flexion intact No cellulitis present Compartment soft  LABS  Results for orders placed or performed during the hospital encounter of 09/18/22 (from the past 24 hour(s))  Procalcitonin - Baseline     Status: None   Collection Time: 09/18/22  3:20 PM  Result Value Ref Range   Procalcitonin <0.10 ng/mL  Troponin I (High Sensitivity)     Status: None   Collection Time: 09/18/22  3:20 PM  Result Value Ref Range   Troponin I (High Sensitivity) 9 <18 ng/L  Troponin I (High Sensitivity)     Status: None   Collection Time: 09/18/22  5:29 PM  Result Value Ref Range   Troponin I (High Sensitivity) 9 <18 ng/L  CBC     Status: Abnormal   Collection Time: 09/19/22  5:52 AM  Result Value Ref Range   WBC 15.8 (H) 4.0 - 10.5 K/uL   RBC 4.20 3.87 - 5.11 MIL/uL   Hemoglobin 12.9 12.0 - 15.0 g/dL   HCT 39.7 36.0 - 46.0 %   MCV 94.5 80.0 - 100.0 fL   MCH 30.7 26.0 - 34.0 pg   MCHC 32.5 30.0 - 36.0 g/dL   RDW 13.5 11.5 - 15.5 %   Platelets 215 150 - 400 K/uL   nRBC 0.0 0.0 - 0.2 %  Basic metabolic panel     Status: Abnormal   Collection Time: 09/19/22  5:52 AM  Result Value Ref Range   Sodium  137 135 - 145 mmol/L   Potassium 4.3 3.5 - 5.1 mmol/L   Chloride 102 98 - 111 mmol/L   CO2 26 22 - 32 mmol/L   Glucose, Bld 256 (H) 70 - 99 mg/dL   BUN 18 8 - 23 mg/dL   Creatinine, Ser 1.12 (H) 0.44 - 1.00 mg/dL   Calcium 9.4 8.9 - 10.3 mg/dL   GFR, Estimated 47 (L) >60 mL/min   Anion gap 9 5 - 15    DG Chest Port 1 View  Result Date: 09/18/2022 CLINICAL DATA:  Preop chest. EXAM: PORTABLE CHEST 1 VIEW COMPARISON:  One-view chest x-ray 10/31/2021 FINDINGS: Atherosclerotic changes are present at the aortic arch. Heart is mildly enlarged, exaggerated by low lung volumes. Lungs are clear. No edema or effusion is present. Right shoulder hemiarthroplasty noted. IMPRESSION: No acute cardiopulmonary disease. Electronically Signed   By: San Morelle M.D.   On: 09/18/2022 14:39   DG Hip Unilat W or Wo Pelvis 2-3 Views Left  Result Date: 09/18/2022 CLINICAL DATA:  Unwitnessed fall. EXAM: DG HIP (WITH OR WITHOUT PELVIS) 2-3V LEFT COMPARISON:  None Available. FINDINGS: Severely displaced and comminuted fracture is seen involving the proximal left femoral shaft. No significant abnormality seen involving the hip joints. IMPRESSION: Severely displaced and comminuted proximal left femoral shaft fracture. Electronically Signed   By: Marijo Conception M.D.   On: 09/18/2022 12:41   DG Knee 2 Views Left  Result Date: 09/18/2022 CLINICAL DATA:  Left knee pain after unwitnessed fall. EXAM: LEFT KNEE - 1-2 VIEW COMPARISON:  None Available. FINDINGS: Status post left total knee arthroplasty. No definite fracture or dislocation is noted. Vascular calcifications are noted. IMPRESSION: No acute abnormality seen. Electronically Signed   By: Marijo Conception M.D.   On: 09/18/2022 12:40    Assessment/Plan:     Principal Problem:   Left femoral shaft fracture (HCC) Active Problems:   HTN (hypertension)   Type 2 diabetes mellitus with stage 3 chronic kidney disease, without long-term current use of insulin  (HCC)   Hyperlipidemia   Vitamin D deficiency   Leukocytosis   Acute deep vein thrombosis (DVT) of distal vein of left lower extremity (HCC)   Stage 3a chronic kidney disease (HCC)   Hypertension associated with diabetes ([place])   PAD (peripheral artery disease) (HCC)   Valvular heart disease   Colostomy in place [facility])   Persistent atrial fibrillation ([place])  Patient currently stable.  Continue Buck's traction.  Surgery planned for Monday.    Thornton Park , MD 09/19/2022, 2:56 PM

## 2022-09-19 NOTE — Consult Note (Signed)
[place] Nurse ostomy consult note Stoma type/location: LLQ Colostomy. Patient known to our department from ostomy creation in 2022.  Stomal assessment/size: Last measured at 1 and 3/8 inch oval, flush. A parastomal hernia has been documented in the past. Not measured today. Peristomal assessment: Not seen today Treatment options for stomal/peristomal skin: compressible convex with skin barrier ring Output : Brown stool Ostomy pouching: 1-piece convex ostomy pouch with skin barrier ring. Pouch is Kellie Simmering # [id number] and the skin barrier ring is Kellie Simmering # [phone]. Education provided: None today Enrolled patient in [facility] program: Previously. Patient is established with a provider.  Guidance provided for Nursing pertaining to ostomy care and for the provision of supplies.  [place] nursing team will not follow, but will remain available to this patient, the nursing and medical teams.  Please re-consult if needed.  Thank you for inviting [place] to participate in this patient's Plan of Care.  Maudie Flakes, MSN, RN, CNS, [place], Serita Grammes, [employer], [employer] phone:  [phone]

## 2022-09-20 DIAGNOSIS — Z8673 Personal history of transient ischemic attack (TIA), and cerebral infarction without residual deficits: Secondary | ICD-10-CM

## 2022-09-20 DIAGNOSIS — R627 Adult failure to thrive: Secondary | ICD-10-CM | POA: Diagnosis not present

## 2022-09-20 DIAGNOSIS — F01C11 Vascular dementia, severe, with agitation: Secondary | ICD-10-CM

## 2022-09-20 DIAGNOSIS — I5022 Chronic systolic (congestive) heart failure: Secondary | ICD-10-CM

## 2022-09-20 DIAGNOSIS — F0150 Vascular dementia without behavioral disturbance: Secondary | ICD-10-CM | POA: Diagnosis present

## 2022-09-20 DIAGNOSIS — S72352A Displaced comminuted fracture of shaft of left femur, initial encounter for closed fracture: Secondary | ICD-10-CM | POA: Diagnosis not present

## 2022-09-20 LAB — MAGNESIUM: Magnesium: 2.2 mg/dL (ref 1.7–2.4)

## 2022-09-20 MED ORDER — MELATONIN 5 MG PO TABS: 5.0000 mg | ORAL_TABLET | Freq: Every day | ORAL | Status: DC

## 2022-09-20 MED ORDER — QUETIAPINE FUMARATE 25 MG PO TABS
25.0000 mg | ORAL_TABLET | Freq: Every day | ORAL | Status: DC
  Administered 2022-09-21 – 2022-09-24 (×3): 25 mg via ORAL
  Filled 2022-09-20 (×5): qty 1

## 2022-09-20 MED ORDER — CEFAZOLIN SODIUM-DEXTROSE 2-4 GM/100ML-% IV SOLN
2.0000 g | INTRAVENOUS | Status: AC
  Administered 2022-09-21: 2 g via INTRAVENOUS

## 2022-09-20 MED ORDER — DILTIAZEM HCL-DEXTROSE 125-5 MG/125ML-% IV SOLN (PREMIX)
5.0000 mg/h | INTRAVENOUS | Status: DC
  Filled 2022-09-20: qty 125

## 2022-09-20 MED ORDER — DILTIAZEM HCL 25 MG/5ML IV SOLN
10.0000 mg | Freq: Once | INTRAVENOUS | Status: AC
  Administered 2022-09-20: 10 mg via INTRAVENOUS
  Filled 2022-09-20: qty 5

## 2022-09-20 NOTE — Progress Notes (Signed)
Nurse presents at bedside to administer the Diltazem, the patient has pulled off all EKG leads, Purewick, and IV.  3 Nurses present at the bedside to assist in placing a new IV.  The primary nurse took 2 unsuccessful attempts to place and IV  the charge nurse took 2 attempts, and on the 2nd attempt the charge nurse was successful in placing the IV in the patients right hand/wrist.  Nurse has placed mitts on the patient to keep the patient safe.    Nurse attempted to administer the Diltazem ordered by Dr. Sidney Ace, and the patient hits the nurse with mitts on, and in doing so the patient dislodged her IV and the IV came out intact.  Nurse has made Dr. Sidney Ace aware that there has been an IV team consult placed and will administer the Diltazem IVP as soon as there is another IV placed.

## 2022-09-20 NOTE — Plan of Care (Signed)

## 2022-09-20 NOTE — Progress Notes (Signed)
  Subjective:  Patient is sleeping comfortably and was not awoken.  Patient was confused yesterday during my encounter with her.  She was having difficulty with her hearing aids as well.  Patient has a comminuted fracture of the left proximal femur.  She presented on Eliquis which has now been stopped.  Patient is on a heparin drip which will need to stop tonight in preparation for surgery tomorrow.    Objective:   VITALS:   Vitals:   09/19/22 2349 09/20/22 0333 09/20/22 0738 09/20/22 1125  BP: 123/68 116/84 114/80 98/82  Pulse: 89  95 70  Resp:  '19 16 16  '$ Temp:  98.8 F (37.1 C) 98 F (36.7 C) (!) 97.1 F (36.2 C)  TempSrc:  Oral    SpO2:  96% 95% 91%  Weight:      Height:        PHYSICAL EXAM: Left lower extremity: Patient was not awoken for exam today.  Patient is in Buck's traction with the left lower extremity.  Sleeping comfortably and is in no acute distress.   LABS  No results found for this or any previous visit (from the past 24 hour(s)).  DG Chest Port 1 View  Result Date: 09/18/2022 CLINICAL DATA:  Preop chest. EXAM: PORTABLE CHEST 1 VIEW COMPARISON:  One-view chest x-ray 10/31/2021 FINDINGS: Atherosclerotic changes are present at the aortic arch. Heart is mildly enlarged, exaggerated by low lung volumes. Lungs are clear. No edema or effusion is present. Right shoulder hemiarthroplasty noted. IMPRESSION: No acute cardiopulmonary disease. Electronically Signed   By: San Morelle M.D.   On: 09/18/2022 14:39   DG Hip Unilat W or Wo Pelvis 2-3 Views Left  Result Date: 09/18/2022 CLINICAL DATA:  Unwitnessed fall. EXAM: DG HIP (WITH OR WITHOUT PELVIS) 2-3V LEFT COMPARISON:  None Available. FINDINGS: Severely displaced and comminuted fracture is seen involving the proximal left femoral shaft. No significant abnormality seen involving the hip joints. IMPRESSION: Severely displaced and comminuted proximal left femoral shaft fracture. Electronically Signed   By: Marijo Conception M.D.   On: 09/18/2022 12:41   DG Knee 2 Views Left  Result Date: 09/18/2022 CLINICAL DATA:  Left knee pain after unwitnessed fall. EXAM: LEFT KNEE - 1-2 VIEW COMPARISON:  None Available. FINDINGS: Status post left total knee arthroplasty. No definite fracture or dislocation is noted. Vascular calcifications are noted. IMPRESSION: No acute abnormality seen. Electronically Signed   By: Marijo Conception M.D.   On: 09/18/2022 12:40    Assessment/Plan:     Principal Problem:   Left femoral shaft fracture (HCC) Active Problems:   HTN (hypertension)   Type 2 diabetes mellitus with stage 3 chronic kidney disease, without long-term current use of insulin (HCC)   Hyperlipidemia   Vitamin D deficiency   Leukocytosis   Acute deep vein thrombosis (DVT) of distal vein of left lower extremity (HCC)   Stage 3a chronic kidney disease (HCC)   Hypertension associated with diabetes ([place])   PAD (peripheral artery disease) (HCC)   Valvular heart disease   Colostomy in place [facility])   Persistent atrial fibrillation ([place])  Plan for surgical fixation of the patient's left femur fracture tomorrow.  Patient will be n.p.o. after midnight.  Heparin will be stopped tonight in preparation for surgery.  Continue Buck's traction.    Thornton Park , MD 09/20/2022, 12:05 PM

## 2022-09-20 NOTE — Progress Notes (Signed)
  Progress Note   Patient: Heidi Whitaker RSW:[id number] DOB: Nov 26, 1929 DOA: 09/18/2022     2 DOS: the patient was seen and examined on 09/20/2022   Brief hospital course: [name] is a 88 year old female with history of non-insulin-dependent diabetes mellitus, hypertension, hyperlipidemia, persistent atrial fibrillation on Eliquis and digoxin, history of stroke, history of DVT, chronic diarrhea, status post colostomy secondary to benign tumors, who presents emergency department for chief concerns of unwitnessed mechanical fall. Left hip with and without pelvis x-ray was read as: Severely displaced and comminuted proximal left femoral shaft fracture. Surgery is delayed to Monday due to Eliquis use.   Assessment and Plan: Left femoral shaft fracture (HCC) Mechanical fall Patient is seen by orthopedics, recommended surgery with internal fixation.   Surgery rescheduled to tomorrow due to use of Eliquis before admission.  Chronic systolic congestive heart failure. Patient has no evidence of volume overload this time.  But the patient has relatively high risk for surgery.  Persistent atrial fibrillation (HCC) Acute deep vein thrombosis (DVT) of distal vein of left lower extremity ([place]) Patient is off Eliquis, on heparin prophylaxis. Had some tachycardia due to agitation last night.  Heart rate did fall back to baseline this morning.  History of stroke. Vascular dementia. Failure to thrive. Insomnia. Discussed with patient daughter, patient condition has been deteriorating recently.  She has significant confusion and memory issues.  This most likely due to vascular dementia.  Since admission to the hospital, patient has mostly refusing to eat.  This morning, she was very confused, stating somebody is trying to kill her.  She is not sleeping at night either.  We will add Seroquel at nighttime. Patient long-term prognosis is poor, will obtain Perative care consult.  Daughter  agrees to go to surgery even with higher risk from congestive heart failure.  Hopefully at the least the surgery can control the pain better. After surgery, if patient continued deteriorating without eating.  May consider possibility of hospice care.   Colostomy in place [facility]) Continue to follow.   Stage 3a chronic kidney disease (HCC) Stable.   HTN (hypertension) Continue home medicines.      Subjective:  Patient has been eating, mostly confused.  Has occasional agitation and hallucination and delusion.  Not able to sleep at nighttime.   Physical Exam: Vitals:   09/19/22 2349 09/20/22 0333 09/20/22 0738 09/20/22 1125  BP: 123/68 116/84 114/80 98/82  Pulse: 89  95 70  Resp:  '19 16 16  '$ Temp:  98.8 F (37.1 C) 98 F (36.7 C) (!) 97.1 F (36.2 C)  TempSrc:  Oral    SpO2:  96% 95% 91%  Weight:      Height:       General exam: Appears calm and comfortable  Respiratory system: Clear to auscultation. Respiratory effort normal. Cardiovascular system: Irregular. No JVD, murmurs, rubs, gallops or clicks. No pedal edema. Gastrointestinal system: Abdomen is nondistended, soft and nontender. No organomegaly or masses felt. Normal bowel sounds heard. Central nervous system: Very confused. No focal neurological deficits. Extremities: Symmetric 5 x 5 power. Skin: No rashes, lesions or ulcers   Data Reviewed:  Lab results reviewed again.  Family Communication: Daughter updated.  Disposition: Status is: Inpatient Remains inpatient appropriate because: Verity of disease, inpatient procedure.  Planned Discharge Destination: Skilled nursing facility    Time spent: 55 minutes  Author: Sharen Hones, MD 09/20/2022 12:53 PM  For on call review [email or web address].

## 2022-09-20 NOTE — Progress Notes (Signed)
[place] was administered  0337 Dr. Sidney Ace was made aware of the patients below vital signs.  No new orders or interventions at this time.     09/20/22 0333  Vitals  Temp 98.8 F (37.1 C)  Temp Source Oral  BP 116/84  MAP (mmHg) 95  BP Location Right Arm  BP Method Automatic  Patient Position (if appropriate) Lying  ECG Heart Rate (!) 102  Resp 19  MEWS COLOR  MEWS Score Color Green  Oxygen Therapy  SpO2 96 %  O2 Device Nasal Cannula  O2 Flow Rate (L/min) 2 L/min  MEWS Score  MEWS Temp 0  MEWS Systolic 0  MEWS Pulse 1  MEWS RR 0  MEWS LOC 0  MEWS Score 1

## 2022-09-20 NOTE — Progress Notes (Signed)
Dr. Sidney Ace was advised of the post Diltazem vital signs.  The provider has ordered Diltazem drip and diltazem IVP.  Nurse has advised the provider that Diltazem drip cannot be administered on this unit.  The provider states that he wants to the nurse to give the Diltazem IVP, then retake vital signs.     09/19/22 2349  Vitals  BP 123/68  MAP (mmHg) 84  BP Method Automatic  Pulse Rate 89  ECG Heart Rate (!) 114  MEWS COLOR  MEWS Score Color Yellow  MEWS Score  MEWS Temp 0  MEWS Systolic 0  MEWS Pulse 2  MEWS RR 0  MEWS LOC 0  MEWS Score 2

## 2022-09-20 NOTE — Progress Notes (Signed)
Dr. Sidney Ace was made aware that the patient has refused pain medication.  Patient is agitated and is requested to be left alone.  Dr. Sidney Ace has been advised of the patient HR that has sustained over 120bpm and the provider order Diltazem IVP to be administered  Diltazem has been given while 3 nurses are at the bedside.   09/19/22 2327  Vitals  Temp 98.7 F (37.1 C)  Temp Source Oral  BP (!) 127/105  MAP (mmHg) 114  BP Location Left Arm  BP Method Automatic  Patient Position (if appropriate) Lying  Pulse Rate (!) 113  ECG Heart Rate (!) 121  MEWS COLOR  MEWS Score Color Yellow  MEWS Score  MEWS Temp 0  MEWS Systolic 0  MEWS Pulse 2  MEWS RR 0  MEWS LOC 0  MEWS Score 2

## 2022-09-21 ENCOUNTER — Inpatient Hospital Stay: Payer: Medicare HMO | Admitting: Anesthesiology

## 2022-09-21 ENCOUNTER — Encounter
Admission: EM | Disposition: A | Discharge status: Hospice | Payer: Self-pay | Source: Home / Self Care | Attending: Internal Medicine

## 2022-09-21 ENCOUNTER — Encounter: Payer: Self-pay | Admitting: Internal Medicine

## 2022-09-21 ENCOUNTER — Other Ambulatory Visit: Payer: Self-pay

## 2022-09-21 ENCOUNTER — Inpatient Hospital Stay: Payer: Medicare HMO

## 2022-09-21 DIAGNOSIS — R627 Adult failure to thrive: Secondary | ICD-10-CM | POA: Diagnosis not present

## 2022-09-21 DIAGNOSIS — I5022 Chronic systolic (congestive) heart failure: Secondary | ICD-10-CM | POA: Diagnosis not present

## 2022-09-21 DIAGNOSIS — E669 Obesity, unspecified: Secondary | ICD-10-CM

## 2022-09-21 DIAGNOSIS — S72352A Displaced comminuted fracture of shaft of left femur, initial encounter for closed fracture: Secondary | ICD-10-CM | POA: Diagnosis not present

## 2022-09-21 HISTORY — PX: FEMUR IM NAIL: SHX1597

## 2022-09-21 LAB — GLUCOSE, CAPILLARY: Glucose-Capillary: 205 mg/dL — ABNORMAL HIGH (ref 70–99)

## 2022-09-21 LAB — TYPE AND SCREEN
ABO/RH(D): O POS
Antibody Screen: NEGATIVE

## 2022-09-21 SURGERY — INSERTION, INTRAMEDULLARY ROD, FEMUR
Anesthesia: General | Site: Hip | Laterality: Left

## 2022-09-21 MED ORDER — ONDANSETRON HCL 4 MG/2ML IJ SOLN: 4.0000 mg | Freq: Four times a day (QID) | INTRAMUSCULAR | Status: DC | PRN

## 2022-09-21 MED ORDER — APIXABAN 5 MG PO TABS
5.0000 mg | ORAL_TABLET | Freq: Two times a day (BID) | ORAL | Status: DC
  Administered 2022-09-22 – 2022-09-25 (×6): 5 mg via ORAL
  Filled 2022-09-21 (×7): qty 1

## 2022-09-21 MED ORDER — FENTANYL CITRATE (PF) 100 MCG/2ML IJ SOLN
25.0000 ug | INTRAMUSCULAR | Status: DC | PRN
  Administered 2022-09-21: 25 ug via INTRAVENOUS

## 2022-09-21 MED ORDER — DOCUSATE SODIUM 100 MG PO CAPS
100.0000 mg | ORAL_CAPSULE | Freq: Two times a day (BID) | ORAL | Status: DC
  Administered 2022-09-21 – 2022-09-25 (×6): 100 mg via ORAL
  Filled 2022-09-21 (×8): qty 1

## 2022-09-21 MED ORDER — BISACODYL 10 MG RE SUPP
10.0000 mg | Freq: Every day | RECTAL | Status: DC | PRN
  Administered 2022-09-22: 10 mg via RECTAL
  Filled 2022-09-21: qty 1

## 2022-09-21 MED ORDER — LIDOCAINE HCL (CARDIAC) PF 100 MG/5ML IV SOSY
PREFILLED_SYRINGE | INTRAVENOUS | Status: DC | PRN
  Administered 2022-09-21: 80 mg via INTRAVENOUS

## 2022-09-21 MED ORDER — ONDANSETRON HCL 4 MG/2ML IJ SOLN
INTRAMUSCULAR | Status: DC | PRN
  Administered 2022-09-21: 4 mg via INTRAVENOUS

## 2022-09-21 MED ORDER — PROPOFOL 10 MG/ML IV BOLUS
INTRAVENOUS | Status: DC | PRN
  Administered 2022-09-21: 80 mg via INTRAVENOUS

## 2022-09-21 MED ORDER — LACTATED RINGERS IV SOLN: INTRAVENOUS | Status: DC | PRN

## 2022-09-21 MED ORDER — ACETAMINOPHEN 500 MG PO TABS
500.0000 mg | ORAL_TABLET | Freq: Four times a day (QID) | ORAL | Status: AC
  Administered 2022-09-21 – 2022-09-22 (×3): 500 mg via ORAL
  Filled 2022-09-21 (×3): qty 1

## 2022-09-21 MED ORDER — ALUM & MAG HYDROXIDE-SIMETH 200-200-20 MG/5ML PO SUSP: 30.0000 mL | ORAL | Status: DC | PRN

## 2022-09-21 MED ORDER — TRAMADOL HCL 50 MG PO TABS
50.0000 mg | ORAL_TABLET | Freq: Four times a day (QID) | ORAL | Status: DC
  Administered 2022-09-21 – 2022-09-24 (×5): 50 mg via ORAL
  Filled 2022-09-21 (×8): qty 1

## 2022-09-21 MED ORDER — PHENYLEPHRINE HCL-NACL 20-0.9 MG/250ML-% IV SOLN
INTRAVENOUS | Status: DC | PRN
  Administered 2022-09-21: 50 ug/min via INTRAVENOUS

## 2022-09-21 MED ORDER — METOPROLOL TARTRATE 5 MG/5ML IV SOLN
INTRAVENOUS | Status: DC | PRN
  Administered 2022-09-21: 3 mg via INTRAVENOUS
  Administered 2022-09-21: 2 mg via INTRAVENOUS

## 2022-09-21 MED ORDER — MORPHINE SULFATE (PF) 2 MG/ML IV SOLN: 0.5000 mg | INTRAVENOUS | Status: DC | PRN

## 2022-09-21 MED ORDER — 0.9 % SODIUM CHLORIDE (POUR BTL) OPTIME
TOPICAL | Status: DC | PRN
  Administered 2022-09-21: 500 mL
  Administered 2022-09-21: 1000 mL

## 2022-09-21 MED ORDER — FENTANYL CITRATE (PF) 100 MCG/2ML IJ SOLN
INTRAMUSCULAR | Status: AC
  Filled 2022-09-21: qty 2

## 2022-09-21 MED ORDER — ROCURONIUM BROMIDE 100 MG/10ML IV SOLN
INTRAVENOUS | Status: DC | PRN
  Administered 2022-09-21: 10 mg via INTRAVENOUS
  Administered 2022-09-21: 50 mg via INTRAVENOUS

## 2022-09-21 MED ORDER — DILTIAZEM HCL-DEXTROSE 125-5 MG/125ML-% IV SOLN (PREMIX)
INTRAVENOUS | Status: AC
  Filled 2022-09-21: qty 125

## 2022-09-21 MED ORDER — METHOCARBAMOL 500 MG PO TABS: 500.0000 mg | ORAL_TABLET | Freq: Four times a day (QID) | ORAL | Status: DC | PRN

## 2022-09-21 MED ORDER — ACETAMINOPHEN 10 MG/ML IV SOLN
INTRAVENOUS | Status: AC
  Filled 2022-09-21: qty 100

## 2022-09-21 MED ORDER — OXYCODONE HCL 5 MG PO TABS
5.0000 mg | ORAL_TABLET | ORAL | Status: DC | PRN
  Administered 2022-09-21 – 2022-09-24 (×7): 10 mg via ORAL
  Administered 2022-09-25: 5 mg via ORAL
  Filled 2022-09-21 (×5): qty 2
  Filled 2022-09-21: qty 1
  Filled 2022-09-21 (×3): qty 2

## 2022-09-21 MED ORDER — SODIUM CHLORIDE 0.9 % IV SOLN: INTRAVENOUS | Status: DC

## 2022-09-21 MED ORDER — ACETAMINOPHEN 325 MG PO TABS: 325.0000 mg | ORAL_TABLET | Freq: Four times a day (QID) | ORAL | Status: DC | PRN

## 2022-09-21 MED ORDER — FENTANYL CITRATE (PF) 100 MCG/2ML IJ SOLN
INTRAMUSCULAR | Status: DC | PRN
  Administered 2022-09-21 (×4): 25 ug via INTRAVENOUS

## 2022-09-21 MED ORDER — CEFAZOLIN SODIUM-DEXTROSE 2-4 GM/100ML-% IV SOLN
2.0000 g | Freq: Four times a day (QID) | INTRAVENOUS | Status: AC
  Administered 2022-09-21 (×2): 2 g via INTRAVENOUS
  Filled 2022-09-21 (×2): qty 100

## 2022-09-21 MED ORDER — PHENOL 1.4 % MT LIQD: 1.0000 | OROMUCOSAL | Status: DC | PRN

## 2022-09-21 MED ORDER — BUPIVACAINE HCL (PF) 0.5 % IJ SOLN
INTRAMUSCULAR | Status: AC
  Filled 2022-09-21: qty 30

## 2022-09-21 MED ORDER — DEXAMETHASONE SODIUM PHOSPHATE 10 MG/ML IJ SOLN
INTRAMUSCULAR | Status: DC | PRN
  Administered 2022-09-21: 5 mg via INTRAVENOUS

## 2022-09-21 MED ORDER — ONDANSETRON HCL 4 MG/2ML IJ SOLN: 4.0000 mg | Freq: Once | INTRAMUSCULAR | Status: DC | PRN

## 2022-09-21 MED ORDER — METHOCARBAMOL 1000 MG/10ML IJ SOLN: 500.0000 mg | Freq: Four times a day (QID) | INTRAVENOUS | Status: DC | PRN

## 2022-09-21 MED ORDER — ONDANSETRON HCL 4 MG PO TABS: 4.0000 mg | ORAL_TABLET | Freq: Four times a day (QID) | ORAL | Status: DC | PRN

## 2022-09-21 MED ORDER — MENTHOL 3 MG MT LOZG: 1.0000 | LOZENGE | OROMUCOSAL | Status: DC | PRN

## 2022-09-21 MED ORDER — BUPIVACAINE HCL (PF) 0.5 % IJ SOLN
INTRAMUSCULAR | Status: DC | PRN
  Administered 2022-09-21: 30 mL

## 2022-09-21 MED ORDER — CEFAZOLIN SODIUM-DEXTROSE 2-4 GM/100ML-% IV SOLN
INTRAVENOUS | Status: AC
  Filled 2022-09-21: qty 100

## 2022-09-21 MED ORDER — DILTIAZEM HCL-DEXTROSE 125-5 MG/125ML-% IV SOLN (PREMIX)
INTRAVENOUS | Status: DC | PRN
  Administered 2022-09-21: 10 mg/h via INTRAVENOUS

## 2022-09-21 MED ORDER — SUGAMMADEX SODIUM 200 MG/2ML IV SOLN
INTRAVENOUS | Status: DC | PRN
  Administered 2022-09-21: 160.2 mg via INTRAVENOUS

## 2022-09-21 SURGICAL SUPPLY — 46 items
BIT DRILL 4.3MMS DISTAL GRDTED (BIT) IMPLANT
BIT DRILL LAG SCREW (DRILL) IMPLANT
BNDG COHESIVE 6X5 TAN ST LF (GAUZE/BANDAGES/DRESSINGS) ×2 IMPLANT
DRAPE 3/4 80X56 (DRAPES) ×2 IMPLANT
DRAPE SURG 17X11 SM STRL (DRAPES) ×2 IMPLANT
DRAPE U-SHAPE 47X51 STRL (DRAPES) ×2 IMPLANT
DRILL 4.3MMS DISTAL GRADUATED (BIT) ×1
DRILL LAG SCREW (DRILL) ×1
DRSG OPSITE POSTOP 3X4 (GAUZE/BANDAGES/DRESSINGS) ×2 IMPLANT
DRSG OPSITE POSTOP 4X14 (GAUZE/BANDAGES/DRESSINGS) IMPLANT
DRSG OPSITE POSTOP 4X6 (GAUZE/BANDAGES/DRESSINGS) ×1 IMPLANT
DURAPREP 26ML APPLICATOR (WOUND CARE) ×2 IMPLANT
ELECT REM PT RETURN 9FT ADLT (ELECTROSURGICAL) ×1
ELECTRODE REM PT RTRN 9FT ADLT (ELECTROSURGICAL) ×1 IMPLANT
GLOVE BIOGEL PI IND STRL 9 (GLOVE) ×1 IMPLANT
GLOVE BIOGEL PI ORTHO SZ9 (GLOVE) ×4 IMPLANT
GOWN STRL REUS TWL 2XL XL LVL4 (GOWN DISPOSABLE) ×1 IMPLANT
GOWN STRL REUS W/ TWL LRG LVL3 (GOWN DISPOSABLE) ×1 IMPLANT
GOWN STRL REUS W/TWL LRG LVL3 (GOWN DISPOSABLE) ×1
GUIDEPIN VERSANAIL DSP 3.2X444 (ORTHOPEDIC DISPOSABLE SUPPLIES) IMPLANT
GUIDEWIRE BALL NOSE 100CM (WIRE) IMPLANT
HEMOVAC 400CC 10FR (MISCELLANEOUS) IMPLANT
HFN LH 130 DEG 9MM X 360MM (Nail) IMPLANT
HIP FRA NAIL LAG SCREW 10.5X90 (Orthopedic Implant) ×1 IMPLANT
KIT TURNOVER CYSTO (KITS) ×1 IMPLANT
MANIFOLD NEPTUNE II (INSTRUMENTS) ×1 IMPLANT
MAT ABSORB  FLUID 56X50 GRAY (MISCELLANEOUS) ×1
MAT ABSORB FLUID 56X50 GRAY (MISCELLANEOUS) ×1 IMPLANT
NS IRRIG 500ML POUR BTL (IV SOLUTION) ×1 IMPLANT
PACK HIP COMPR (MISCELLANEOUS) ×1 IMPLANT
PAD ARMBOARD 7.5X6 YLW CONV (MISCELLANEOUS) ×1 IMPLANT
SCREW BONE CORTICAL 5.0X42 (Screw) IMPLANT
SCREW BONE CORTICAL 5.0X44 (Screw) IMPLANT
SCREW LAG HIP FRA NAIL 10.5X90 (Orthopedic Implant) IMPLANT
SCREWDRIVER HEX TIP 3.5MM (MISCELLANEOUS) IMPLANT
STAPLER SKIN PROX 35W (STAPLE) ×1 IMPLANT
SUCTION FRAZIER HANDLE 10FR (MISCELLANEOUS) ×1
SUCTION TUBE FRAZIER 10FR DISP (MISCELLANEOUS) ×1 IMPLANT
SUT VIC AB 0 CT1 36 (SUTURE) ×2 IMPLANT
SUT VIC AB 2-0 CT1 27 (SUTURE) ×1
SUT VIC AB 2-0 CT1 TAPERPNT 27 (SUTURE) ×1 IMPLANT
SUT VICRYL 0 AB UR-6 (SUTURE) ×1 IMPLANT
SYR 30ML LL (SYRINGE) ×1 IMPLANT
TRAP FLUID SMOKE EVACUATOR (MISCELLANEOUS) ×1 IMPLANT
WATER STERILE IRR 1000ML POUR (IV SOLUTION) IMPLANT
WATER STERILE IRR 500ML POUR (IV SOLUTION) ×1 IMPLANT

## 2022-09-21 NOTE — Care Management Important Message (Signed)
Important Message  Patient Details  Name: Heidi Whitaker MRN: [id number] Date of Birth: September 18, 1929   Medicare Important Message Given:  Yes     Dannette Barbara 09/21/2022, 11:23 AM

## 2022-09-21 NOTE — Anesthesia Preprocedure Evaluation (Signed)
Anesthesia Evaluation  Patient identified by MRN, date of birth, ID band Patient awake    Reviewed: Allergy & Precautions, H&P , NPO status , Patient's Chart, lab work & pertinent test results, reviewed documented beta blocker date and time   History of Anesthesia Complications (+) history of anesthetic complications  Airway Mallampati: II   Neck ROM: full    Dental  (+) Poor Dentition   Pulmonary shortness of breath and with exertion, former smoker,    Pulmonary exam normal        Cardiovascular Exercise Tolerance: Poor hypertension, On Medications + Peripheral Vascular Disease and +CHF  Normal cardiovascular exam Rhythm:regular Rate:Normal     Neuro/Psych PSYCHIATRIC DISORDERS Dementia CVA    GI/Hepatic negative GI ROS, Neg liver ROS,   Endo/Other  negative endocrine ROSdiabetes  Renal/GU Renal disease  negative genitourinary   Musculoskeletal   Abdominal   Peds  Hematology negative hematology ROS (+)   Anesthesia Other Findings Past Medical History: No date: Arrhythmia     Comment:  atrial fibrillation No date: CHF (congestive heart failure) (HCC) No date: Complication of anesthesia No date: Diabetes mellitus without complication (HCC) No date: DVT (deep venous thrombosis) ([place])     Comment:  06/02/2019 No date: Hard of hearing No date: Hypertension No date: PAD (peripheral artery disease) ([place]) Past Surgical History: No date: CESAREAN SECTION 05/17/2019: COLONOSCOPY; N/A     Comment:  Procedure: FLEXIBLE SIGMOIDOSCOPY;  Surgeon: Ileana Roup, MD;  Location: [facility];  Service: General;               Laterality: N/A; 01/09/2019: COLONOSCOPY WITH PROPOFOL; N/A     Comment:  Procedure: COLONOSCOPY WITH PROPOFOL;  Surgeon: Lin Landsman, MD;  Location: [facility] ENDOSCOPY;  Service:               Gastroenterology;  Laterality: N/A; 01/09/2019: ESOPHAGOGASTRODUODENOSCOPY  (EGD) WITH PROPOFOL; N/A     Comment:  Procedure: ESOPHAGOGASTRODUODENOSCOPY (EGD) WITH               PROPOFOL;  Surgeon: Lin Landsman, MD;  Location:               [facility] ENDOSCOPY;  Service: Gastroenterology;  Laterality:               N/A; No date: KNEE SURGERY     Comment:  left 2017/2018 05/29/2021: LOWER EXTREMITY ANGIOGRAPHY; Left     Comment:  Procedure: LOWER EXTREMITY ANGIOGRAPHY;  Surgeon: Algernon Huxley, MD;  Location: [place] CV LAB;  Service:               Cardiovascular;  Laterality: Left; 06/05/2021: LOWER EXTREMITY ANGIOGRAPHY; Right     Comment:  Procedure: LOWER EXTREMITY ANGIOGRAPHY;  Surgeon: Algernon Huxley, MD;  Location: [place] [facility];  Service:               Cardiovascular;  Laterality: Right; No date: SHOULDER SURGERY     Comment:  right 05/17/2019: XI ROBOTIC ASSISTED LOWER ANTERIOR RESECTION; N/A     Comment:  Procedure: XI ROBOTIC ASSISTED LOWER ANTERIOR RESECTION  WITH END COLOSTOMY;  Surgeon: Ileana Roup, MD;                Location: [facility];  Service: General;  Laterality: N/A; BMI    Body Mass Index: 30.31 kg/m     Reproductive/Obstetrics negative OB ROS                             Anesthesia Physical Anesthesia Plan  ASA: 3 and emergent  Anesthesia Plan: General   Post-op Pain Management:    Induction:   PONV Risk Score and Plan: 4 or greater  Airway Management Planned:   Additional Equipment:   Intra-op Plan:   Post-operative Plan:   Informed Consent: I have reviewed the patients History and Physical, chart, labs and discussed the procedure including the risks, benefits and alternatives for the proposed anesthesia with the patient or authorized representative who has indicated his/her understanding and acceptance.     Dental Advisory Given  Plan Discussed with: CRNA  Anesthesia Plan Comments:         Anesthesia Quick Evaluation

## 2022-09-21 NOTE — Anesthesia Procedure Notes (Signed)
Procedure Name: Intubation Date/Time: 09/21/2022 1:10 PM  Performed by: Nelda Marseille, CRNAPre-anesthesia Checklist: Patient identified, Patient being monitored, Timeout performed, Emergency Drugs available and Suction available Patient Re-evaluated:Patient Re-evaluated prior to induction Oxygen Delivery Method: Circle system utilized Preoxygenation: Pre-oxygenation with 100% oxygen Induction Type: IV induction Ventilation: Mask ventilation without difficulty Laryngoscope Size: Mac, 3 and McGraph Grade View: Grade II Tube type: Oral Tube size: 7.0 mm Number of attempts: 1 Airway Equipment and Method: Stylet Placement Confirmation: ETT inserted through vocal cords under direct vision, positive ETCO2 and breath sounds checked- equal and bilateral Secured at: 21 cm Tube secured with: Tape Dental Injury: Teeth and Oropharynx as per pre-operative assessment

## 2022-09-21 NOTE — Progress Notes (Signed)
2.'5mg'$  of Metoprolol given for HR above 120.

## 2022-09-21 NOTE — Progress Notes (Signed)
  Subjective:  Patient seen in pre-op area.  She is more alert today.  She is able to hear today.    Objective:   VITALS:   Vitals:   09/21/22 0553 09/21/22 0802 09/21/22 1151 09/21/22 1215  BP:  118/81  (!) 95/56  Pulse: (!) 101 100  91  Resp:  18  16  Temp:  97.7 F (36.5 C)  (!) 97.2 F (36.2 C)  TempSrc:    Temporal  SpO2:  94%  93%  Weight:   80.1 kg 80.1 kg  Height:   '5\' 4"'$  (1.626 m) '5\' 4"'$  (1.626 m)    PHYSICAL EXAM: Right lower extremity Neurovascular intact Sensation intact distally Intact pulses distally Dorsiflexion/Plantar flexion intact No cellulitis present Compartment soft  LABS  Results for orders placed or performed during the hospital encounter of 09/18/22 (from the past 24 hour(s))  Type and screen [place]     Status: None   Collection Time: 09/21/22  7:51 AM  Result Value Ref Range   ABO/RH(D) O POS    Antibody Screen NEG    Sample Expiration      09/24/2022,2359 Performed at [facility], [address] [place], [place] [address]     No results found.  Assessment/Plan: Day of Surgery   Principal Problem:   Left femoral shaft fracture (HCC) Active Problems:   HTN (hypertension)   Type 2 diabetes mellitus with stage 3 chronic kidney disease, without long-term current use of insulin (HCC)   Hyperlipidemia   Vitamin D deficiency   Leukocytosis   Acute deep vein thrombosis (DVT) of distal vein of left lower extremity (HCC)   Stage 3a chronic kidney disease (HCC)   Hypertension associated with diabetes (HCC)   PAD (peripheral artery disease) (HCC)   Valvular heart disease   Chronic systolic CHF (congestive heart failure) (HCC)   Colostomy in place [facility])   Persistent atrial fibrillation ([place])   History of stroke   Vascular dementia (HCC)   Failure to thrive in adult   Obesity (BMI 30-39.9)  Patient has been off Eliquis for 3 days.  She has been NPO.  She is cleared medically for surgery.  Heparin  was stopped at midnight last night.    Thornton Park , MD 09/21/2022, 12:46 PM

## 2022-09-21 NOTE — Transfer of Care (Signed)
Immediate Anesthesia Transfer of Care Note  Patient: Heidi Whitaker  Procedure(s) Performed: INTRAMEDULLARY (IM) NAIL FEMORAL (Left: Hip)  Patient Location: PACU  Anesthesia Type:General  Level of Consciousness: sedated  Airway & Oxygen Therapy: Patient Spontanous Breathing and Patient connected to face mask oxygen  Post-op Assessment: Report given to RN and Post -op Vital signs reviewed and stable  Post vital signs: Reviewed and stable  Last Vitals:  Vitals Value Taken Time  BP 109/68 09/21/22 1515  Temp    Pulse    Resp 16 09/21/22 1518  SpO2    Vitals shown include unvalidated device data.  Last Pain:  Vitals:   09/21/22 1215  TempSrc: Temporal  PainSc:       Patients Stated Pain Goal: 0 (22/77/37 5051)  Complications: No notable events documented.

## 2022-09-21 NOTE — Plan of Care (Signed)

## 2022-09-21 NOTE — Progress Notes (Signed)
  Progress Note   Patient: Heidi Whitaker TMH:[id number] DOB: Jun 11, 1929 DOA: 09/18/2022     3 DOS: the patient was seen and examined on 09/21/2022   Brief hospital course: [name] is a 88 year old female with history of non-insulin-dependent diabetes mellitus, hypertension, hyperlipidemia, persistent atrial fibrillation on Eliquis and digoxin, history of stroke, history of DVT, chronic diarrhea, status post colostomy secondary to benign tumors, who presents emergency department for chief concerns of unwitnessed mechanical fall. Left hip with and without pelvis x-ray was read as: Severely displaced and comminuted proximal left femoral shaft fracture. Surgery is delayed to Monday due to Eliquis use.   Assessment and Plan: Left femoral shaft fracture (HCC) Mechanical fall Patient is seen by orthopedics, recommended surgery with internal fixation.   Patient going to surgery today.   Chronic systolic congestive heart failure. Still has now evidence of volume overload.   Persistent atrial fibrillation (HCC) Acute deep vein thrombosis (DVT) of distal vein of left lower extremity (HCC) Heart rate better controlled.  Hold anticoagulation for surgery.   History of stroke. Vascular dementia. Failure to thrive. Insomnia. Patient condition has been deteriorating recently.  She has significant confusion and memory issues.  This most likely due to vascular dementia.   Patient slept better after taking Seroquel, she is not agitated today.  She is n.p.o. pending surgery.   Colostomy in place [facility]) Continue to follow.   Stage 3a chronic kidney disease (HCC) Stable.   HTN (hypertension) Continue home medicines.        Subjective:  Patient has some baseline confusion, no hallucination today.  She slept better last night.  Physical Exam: Vitals:   09/21/22 0500 09/21/22 0553 09/21/22 0802 09/21/22 1151  BP: 104/77  118/81   Pulse: (!) 101 (!) 101 100   Resp: 19  18    Temp: 98.4 F (36.9 C)  97.7 F (36.5 C)   TempSrc:      SpO2: 98%  94%   Weight:    80.1 kg  Height:    '5\' 4"'$  (1.626 m)   General exam: Appears calm and comfortable  Respiratory system: Clear to auscultation. Respiratory effort normal. Cardiovascular system: Irregular. No JVD, murmurs, rubs, gallops or clicks. No pedal edema. Gastrointestinal system: Abdomen is nondistended, soft and nontender. No organomegaly or masses felt. Normal bowel sounds heard. Central nervous system: Alert and oriented x2. No focal neurological deficits. Extremities: Symmetric 5 x 5 power. Skin: No rashes, lesions or ulcers Psychiatry: Mood & affect appropriate.   Data Reviewed:  There are no new results to review at this time.  Family Communication: None  Disposition: Status is: Inpatient Remains inpatient appropriate because: Severity of disease, inpatient procedure.  Planned Discharge Destination: Skilled nursing facility    Time spent: 35 minutes  Author: Sharen Hones, MD 09/21/2022 12:00 PM  For on call review [email or web address].

## 2022-09-21 NOTE — Progress Notes (Addendum)
Subjective:  POST-OP CHECK s/p intramedullary fixation for left femur fracture.   Patient reports left thigh pain as marked.  Family is at the bedside.  Objective:   VITALS:   Vitals:   09/21/22 1548 09/21/22 1600 09/21/22 1615 09/21/22 1655  BP: (!) 92/47 (!) 101/59 105/65 95/69  Pulse: 74  (!) 109 84  Resp: '18 16 15 20  '$ Temp:   (!) 97.2 F (36.2 C)   TempSrc:      SpO2: 100% 94% 95% 95%  Weight:      Height:        PHYSICAL EXAM: Left lower extremity Neurovascular intact Sensation intact distally Intact pulses distally Dorsiflexion/Plantar flexion intact Incision: dressing C/D/I No cellulitis present Compartment soft  LABS  Results for orders placed or performed during the hospital encounter of 09/18/22 (from the past 24 hour(s))  Type and screen N[place]     Status: None   Collection Time: 09/21/22  7:51 AM  Result Value Ref Range   ABO/RH(D) O POS    Antibody Screen NEG    Sample Expiration      09/24/2022,2359 Performed at [facility], [address] [place], [place] [address]   Glucose, capillary     Status: Abnormal   Collection Time: 09/21/22  3:19 PM  Result Value Ref Range   Glucose-Capillary 205 (H) 70 - 99 mg/dL    DG FEMUR MIN 2 VIEWS LEFT  Result Date: 09/21/2022 CLINICAL DATA:  Femur fracture, status post intramedullary nail EXAM: LEFT FEMUR 2 VIEWS COMPARISON:  09/18/2022 FINDINGS: Status post intramedullary nail fixation of comminuted fractures of the proximal left femoral metadiaphysis, with near anatomic fracture alignment. Expected overlying postoperative change. IMPRESSION: Status post intramedullary nail fixation of comminuted fractures of the proximal left femoral metadiaphysis, with near anatomic fracture alignment. Electronically Signed   By: Delanna Ahmadi M.D.   On: 09/21/2022 16:20   DG HIP UNILAT WITH PELVIS 2-3 VIEWS LEFT  Result Date: 09/21/2022 CLINICAL DATA:  Left IM nail EXAM: DG HIP (WITH OR  WITHOUT PELVIS) 2-3V LEFT COMPARISON:  Hip radiograph 09/18/2022 FINDINGS: Intraoperative images during left femur intramedullary nailing. Improved fracture alignment without evidence of immediate complication. Vascular stent noted. IMPRESSION: Intraoperative images during left femur intramedullary nailing. Improved, near anatomic fracture alignment. No evidence of immediate hardware complication. Electronically Signed   By: Maurine Simmering M.D.   On: 09/21/2022 15:39   DG C-Arm 1-60 Min-No Report  Result Date: 09/21/2022 Fluoroscopy was utilized by the requesting physician.  No radiographic interpretation.    Assessment/Plan: Day of Surgery   Principal Problem:   Left femoral shaft fracture (HCC) Active Problems:   HTN (hypertension)   Type 2 diabetes mellitus with stage 3 chronic kidney disease, without long-term current use of insulin (HCC)   Hyperlipidemia   Vitamin D deficiency   Leukocytosis   Acute deep vein thrombosis (DVT) of distal vein of left lower extremity (HCC)   Stage 3a chronic kidney disease (HCC)   Hypertension associated with diabetes (HCC)   PAD (peripheral artery disease) (HCC)   Valvular heart disease   Chronic systolic CHF (congestive heart failure) (HCC)   Colostomy in place [facility])   Persistent atrial fibrillation ([place])   History of stroke   Vascular dementia (HCC)   Failure to thrive in adult   Obesity (BMI 30-39.9)  I made adjustments to the patient's pain medication and have ordered oxycodone for better pain control.  Patient is also written for morphine, Tylenol  and tramadol for pain as needed.  Reviewed the patient's postoperative x-ray which demonstrates the fracture well reduced.  The anterior medullary hardware is well-positioned.  There is no evidence of postop complication.  Patient will back on her Eliquis tomorrow.  The patient will complete 24 hours of postop antibiotics.  Lab work will be drawn in the morning.  We will begin physical therapy tomorrow.   She will be toe-touch weightbearing on the lower extremity given the comminution of her femur fracture.    Thornton Park , MD 09/21/2022, 7:50 PM

## 2022-09-21 NOTE — Op Note (Signed)
09/21/2022  3:49 PM  PATIENT:  Heidi Whitaker    PRE-OPERATIVE DIAGNOSIS:  Left comminuted proximal femoral shaft fracture  POST-OPERATIVE DIAGNOSIS:  Same  PROCEDURE: Medullary fixation for right proximal femoral shaft fracture  SURGEON:  Thornton Park, MD  ANESTHESIA:   General  EBL: 100 cc  IMPLANT:  ZIMMER BIOMET AFFIXUS NAIL 9 mm x 60 mm with a 90 mm lag screw and distal interlocking screws 88m  and 46 mm in length.  PREOPERATIVE INDICATIONS:  Heidi COZBYis a  88y.o. female with a diagnosis of left comminuted proximal femoral shaft fracture.  She presented on Eliquis for atrial fibrillation and had to wait 3 days until surgical fixation could be performed.  The risks, benefits and alternatives were discussed with the patient and her daughter by phone.  The risks include but are not limited to infection, bleeding requiring blood transfusion, nerve or blood vessel injury, malunion, nonunion, hardware prominence, hardware failure, leg length discrepancy or change in lower extremity rotation and need for further surgery including hardware removal with conversion to a total hip arthroplasty. Medical risks include but are not limited to DVT and pulmonary embolism, myocardial infarction, stroke, pneumonia, respiratory failure and death. The patient's daughter understood these risks and wished to proceed with surgery.  OPERATIVE PROCEDURE:  The patient was brought to the operating room and placed in the supine position on the fracture table. The patient received general anesthesia.  A closed reduction was performed under C-arm guidance.  The fracture reduction was confirmed on both AP and lateral views. After adequate reduction was achieved, a time out was performed to verify the patient's name, date of birth, medical record number, correct site of surgery correct procedure to be performed. The timeout was also used to verify the patient received antibiotics and all  appropriate instruments, implants and radiographic studies were available in the room. Once all in attendance were in agreement, the case began. The patient was prepped and draped in a sterile fashion.  The patient received preoperative antibiotics with 2 g of Ancef IV.  An incision was made proximal to the greater trochanter in line with the femur. A guidewire was placed over the tip of the greater trochanter and advanced by drill into the proximal femur to the level of the lesser trochanter.  Confirmation of the drill pin position was made on AP and lateral C-arm images.  The threaded guidepin was then overdrilled with the proximal femoral entry reamer.  A ball-tipped guidewire was then advanced down the intramedullary canal, across the fracture, and down the femoral shaft to the knee.  The ball tip guidewire's position was confirmed at both the knee and hip via C-arm imaging. A depth gauge was used to measure the length of the long nail to be used. It was measured to be 360 mm. The actual nail was then inserted into the proximal femur, across the fracture site and down the femoral shaft. Its position was confirmed on AP and lateral C-arm images.  The ball tip guidewire was removed.  Once the nail was completely seated, the drill guide for the lag screw was placed through the guide arm for the Affixus nail. A guidepin was then placed through this drill guide and advanced through the lateral cortex of the femur, across the fracture site and into the femoral head achieving a tip apex distance of less than 25 mm. The length of the drill pin was measured to be 90 mm, and then the drill  for the lag screw was advanced through the lateral cortex, across the fracture site and up into the femoral head to the depth of the lag screw. The lag screw was then advanced by hand into position across the fracture site into the femoral head. Its final position was confirmed on AP and lateral C-arm images. The set screw in the  top of the intramedullary rod was tightened by hand using a screwdriver. It was backed off a quarter turn to allow for compression at the fracture site.  The attention was then turned to placement of the distal interlocking screws.  Traction was gently released to allow compression at the fracture site.  A perfect circle technique was used to place the still interlocking screws. Two small stab incisions were made over the distal interlocking screw holes.  A free hand technique was used to drill both distal interlocking screws. The depth of the screw holes was measured with a depth gauge. The 42 mm and 46 mm screws were then advanced into position and tightened by hand. Final C-arm images of the entire intramedullary construct were taken in both the AP and lateral planes.   The wounds were irrigated copiously and closed with 0 Vicryl for closure of the deep fascia and 2-0 Vicryl for subcutaneous closure. The skin was approximated with staples. A dry sterile dressing was applied. I was scrubbed and present the entire case and all sharp, sponge and instrument counts were correct at the conclusion of the case. Patient was transferred to a hospital bed and brought to PACU in stable condition.     Timoteo Gaul, MD

## 2022-09-22 ENCOUNTER — Encounter: Payer: Self-pay | Admitting: Orthopedic Surgery

## 2022-09-22 DIAGNOSIS — I5023 Acute on chronic systolic (congestive) heart failure: Secondary | ICD-10-CM | POA: Diagnosis not present

## 2022-09-22 DIAGNOSIS — S72352A Displaced comminuted fracture of shaft of left femur, initial encounter for closed fracture: Secondary | ICD-10-CM | POA: Diagnosis not present

## 2022-09-22 DIAGNOSIS — R627 Adult failure to thrive: Secondary | ICD-10-CM | POA: Diagnosis not present

## 2022-09-22 DIAGNOSIS — F03918 Unspecified dementia, unspecified severity, with other behavioral disturbance: Secondary | ICD-10-CM | POA: Diagnosis present

## 2022-09-22 LAB — BASIC METABOLIC PANEL
Anion gap: 7 (ref 5–15)
BUN: 29 mg/dL — ABNORMAL HIGH (ref 8–23)
CO2: 28 mmol/L (ref 22–32)
Calcium: 8.4 mg/dL — ABNORMAL LOW (ref 8.9–10.3)
Chloride: 101 mmol/L (ref 98–111)
Creatinine, Ser: 1.03 mg/dL — ABNORMAL HIGH (ref 0.44–1.00)
GFR, Estimated: 52 mL/min — ABNORMAL LOW (ref >60)
Glucose, Bld: 234 mg/dL — ABNORMAL HIGH (ref 70–99)
Potassium: 4.3 mmol/L (ref 3.5–5.1)
Sodium: 136 mmol/L (ref 135–145)

## 2022-09-22 LAB — CBC
HCT: 25.8 % — ABNORMAL LOW (ref 36.0–46.0)
Hemoglobin: 8.5 g/dL — ABNORMAL LOW (ref 12.0–15.0)
MCH: 31.5 pg (ref 26.0–34.0)
MCHC: 32.9 g/dL (ref 30.0–36.0)
MCV: 95.6 fL (ref 80.0–100.0)
Platelets: 176 10*3/uL (ref 150–400)
RBC: 2.7 MIL/uL — ABNORMAL LOW (ref 3.87–5.11)
RDW: 13.3 % (ref 11.5–15.5)
WBC: 14.3 10*3/uL — ABNORMAL HIGH (ref 4.0–10.5)
nRBC: 0 % (ref 0.0–0.2)

## 2022-09-22 MED ORDER — LACTULOSE 10 GM/15ML PO SOLN: 20.0000 g | Freq: Two times a day (BID) | ORAL | Status: DC | PRN

## 2022-09-22 MED ORDER — FUROSEMIDE 10 MG/ML IJ SOLN: 40.0000 mg | Freq: Once | INTRAMUSCULAR | Status: DC

## 2022-09-22 NOTE — NC FL2 (Signed)
[place] LEVEL OF CARE SCREENING TOOL     IDENTIFICATION  Patient Name: Heidi Whitaker Birthdate: 04-08-1929 Sex: female Admission Date (Current Location): 09/18/2022  [facility] and [place] Number:  Engineering geologist and Address:  [facility], [address], [place], [place] [address]      Provider Number: [id number]  Attending Physician Name and Address:  Sharen Hones, MD  Relative Name and Phone Number:  [name] Daughter [phone]    Current Level of Care: Hospital Recommended Level of Care: [place] Prior Approval Number:    Date Approved/Denied:   PASRR Number: [id number]  Discharge Plan: SNF    Current Diagnoses: Patient Active Problem List   Diagnosis Date Noted   Dementia with behavioral disturbance ([place]) 09/22/2022   Obesity (BMI 30-39.9) 09/21/2022   History of stroke 09/20/2022   Vascular dementia ([place]) 09/20/2022   Failure to thrive in adult 09/20/2022   Left femoral shaft fracture ([place]) 09/18/2022   Colostomy in place [facility]) 09/18/2022   Persistent atrial fibrillation ([place]) 05/39/7673   Chronic systolic CHF (congestive heart failure) ([place]) 11/25/2021   At high risk for falls 11/25/2021   Acute on chronic systolic CHF (congestive heart failure) ([place]) 10/31/2021   Type II diabetes mellitus with renal manifestations ([place]) 10/31/2021   Stroke ([place]) 10/31/2021   Acute respiratory failure with hypoxia ([place]) 10/31/2021   PAD (peripheral artery disease) (HCC) 05/21/2021   Valvular heart disease 41/93/7902   Acute systolic congestive heart failure ([place]) 05/21/2021   Cerebral infarction involving left cerebellar artery ([place]) 05/21/2021   Dyspnea 05/15/2021   Generalized weakness 05/14/2021   Stage 3a chronic kidney disease ([place]) 11/29/2020   Hypertension associated with diabetes ([place]) 11/29/2020   Aortic atherosclerosis ([place]) 11/29/2020   Tubulovillous adenoma of rectum 03/05/2020   Vitamin D  deficiency 06/09/2019   Atrial fibrillation with RVR ([place]) 06/09/2019   Leukocytosis 06/09/2019   Acute deep vein thrombosis (DVT) of distal vein of left lower extremity (HCC) 06/09/2019   Leg edema 06/09/2019   Diabetes mellitus type 2 in obese (HCC)    Rectal mass 05/17/2019   Insomnia 09/30/2018   Abnormal gait 09/30/2018   Bilateral impacted cerumen 09/30/2018   Chronic diarrhea 09/30/2018   Type 2 diabetes mellitus with stage 3 chronic kidney disease, without long-term current use of insulin ([place]) 09/30/2018   Hyperlipidemia 09/30/2018   Nosebleed 09/30/2018   Does use hearing aid 09/30/2018   Positive colorectal cancer screening using Cologuard test 08/12/2018   HTN (hypertension) 08/12/2018    Orientation RESPIRATION BLADDER Height & Weight     Self, Time, Situation, Place  Normal Continent, External catheter Weight: 80.1 kg Height:  '5\' 4"'$  (162.6 cm)  BEHAVIORAL SYMPTOMS/MOOD NEUROLOGICAL BOWEL NUTRITION STATUS      Continent Diet (See Dc summary)  AMBULATORY STATUS COMMUNICATION OF NEEDS Skin   Extensive Assist Verbally Normal, Surgical wounds                       Personal Care Assistance Level of Assistance  Bathing, Feeding, Dressing Bathing Assistance: Maximum assistance Feeding assistance: Limited assistance Dressing Assistance: Maximum assistance     Functional Limitations Info  Hearing, Sight Sight Info: Impaired Hearing Info: Impaired      SPECIAL CARE FACTORS FREQUENCY  PT (By licensed PT), OT (By licensed OT)     PT Frequency: 5 times per week OT Frequency: 5 times per week  Contractures Contractures Info: Not present    Additional Factors Info  Code Status, Allergies Code Status Info: DNR Allergies Info: NKDA           Current Medications (09/22/2022):  This is the current hospital active medication list Current Facility-Administered Medications  Medication Dose Route Frequency Provider Last Rate Last Admin   0.9 %   sodium chloride infusion   Intravenous Continuous Sharen Hones, MD 50 mL/hr at 09/22/22 1347 Rate Change at 09/22/22 1347   acetaminophen (TYLENOL) tablet 325-650 mg  325-650 mg Oral Q6H PRN Thornton Park, MD       acetaminophen (TYLENOL) tablet 500 mg  500 mg Oral Q6H Thornton Park, MD   500 mg at 09/22/22 2993   alum & mag hydroxide-simeth (MAALOX/MYLANTA) 200-200-20 MG/5ML suspension 30 mL  30 mL Oral Q4H PRN Thornton Park, MD       apixaban Arne [place]) tablet 5 mg  5 mg Oral BID Thornton Park, MD   5 mg at 09/22/22 0902   bisacodyl (DULCOLAX) suppository 10 mg  10 mg Rectal Daily PRN Thornton Park, MD   10 mg at 09/22/22 1411   cholecalciferol (VITAMIN D3) 25 MCG (1000 UNIT) tablet 2,000 Units  2,000 Units Oral Daily Thornton Park, MD   2,000 Units at 09/19/22 1004   digoxin (LANOXIN) tablet 0.0625 mg  0.0625 mg Oral Daily Thornton Park, MD   0.0625 mg at 09/21/22 7169   docusate sodium (COLACE) capsule 100 mg  100 mg Oral BID Thornton Park, MD   100 mg at 09/21/22 2248   feeding supplement (ENSURE ENLIVE / ENSURE PLUS) liquid 237 mL  237 mL Oral BID BM Thornton Park, MD   237 mL at 09/22/22 0901   furosemide (LASIX) injection 40 mg  40 mg Intravenous Once Sharen Hones, MD       labetalol (NORMODYNE) injection 5 mg  5 mg Intravenous Q3H PRN Thornton Park, MD       lactulose (CHRONULAC) 10 GM/15ML solution 20 g  20 g Oral BID PRN Sharen Hones, MD       LORazepam (ATIVAN) injection 0.5 mg  0.5 mg Intravenous TID PRN Thornton Park, MD   0.5 mg at 09/20/22 0453   menthol-cetylpyridinium (CEPACOL) lozenge 3 mg  1 lozenge Oral PRN Thornton Park, MD       Or   phenol (CHLORASEPTIC) mouth spray 1 spray  1 spray Mouth/Throat PRN Thornton Park, MD       methocarbamol (ROBAXIN) tablet 500 mg  500 mg Oral Q6H PRN Thornton Park, MD       Or   methocarbamol (ROBAXIN) 500 mg in dextrose 5 % 50 mL IVPB  500 mg Intravenous Q6H PRN Thornton Park, MD       metoprolol  succinate (TOPROL-XL) 24 hr tablet 50 mg  50 mg Oral Daily Thornton Park, MD   50 mg at 09/21/22 6789   And   metoprolol succinate (TOPROL-XL) 24 hr tablet 25 mg  25 mg Oral QPM Thornton Park, MD   25 mg at 09/21/22 1736   morphine (PF) 2 MG/ML injection 0.5-1 mg  0.5-1 mg Intravenous Q2H PRN Thornton Park, MD       multivitamin with minerals tablet 1 tablet  1 tablet Oral Daily Thornton Park, MD   1 tablet at 09/22/22 0856   ondansetron (ZOFRAN) tablet 4 mg  4 mg Oral Q6H PRN Thornton Park, MD       Or   ondansetron [facility]) injection 4 mg  4 mg Intravenous Q6H PRN Thornton Park, MD       oxyCODONE (Oxy IR/ROXICODONE) immediate release tablet 5-10 mg  5-10 mg Oral Q4H PRN Thornton Park, MD   10 mg at 09/22/22 1549   polyethylene glycol (MIRALAX / GLYCOLAX) packet 17 g  17 g Oral Daily PRN Thornton Park, MD       pravastatin (PRAVACHOL) tablet 20 mg  20 mg Oral q1800 Thornton Park, MD   20 mg at 09/21/22 1737   QUEtiapine (SEROQUEL) tablet 25 mg  25 mg Oral QHS Thornton Park, MD   25 mg at 09/21/22 2248   senna-docusate (Senokot-S) tablet 1 tablet  1 tablet Oral QHS PRN Thornton Park, MD       traMADol Veatrice Bourbon) tablet 50 mg  50 mg Oral Q6H Thornton Park, MD   50 mg at 09/21/22 1736     Discharge Medications: Please see discharge summary for a list of discharge medications.  Relevant Imaging Results:  Relevant Lab Results:   Additional Information SS# [id number]  Conception Oms, RN

## 2022-09-22 NOTE — NC FL2 (Signed)
[place] LEVEL OF CARE SCREENING TOOL     IDENTIFICATION  Patient Name: Heidi Whitaker Birthdate: 1929/08/20 Sex: female Admission Date (Current Location): 09/18/2022  [facility] and [place] Number:  Engineering geologist and Address:  [facility], [address], [place], [place] [address]      Provider Number: [id number]  Attending Physician Name and Address:  Sharen Hones, MD  Relative Name and Phone Number:  [name] Daughter [phone]    Current Level of Care: Hospital Recommended Level of Care: [place] Prior Approval Number:    Date Approved/Denied:   PASRR Number: [id number]  Discharge Plan: SNF    Current Diagnoses: Patient Active Problem List   Diagnosis Date Noted   Dementia with behavioral disturbance ([place]) 09/22/2022   Obesity (BMI 30-39.9) 09/21/2022   History of stroke 09/20/2022   Vascular dementia ([place]) 09/20/2022   Failure to thrive in adult 09/20/2022   Left femoral shaft fracture ([place]) 09/18/2022   Colostomy in place [facility]) 09/18/2022   Persistent atrial fibrillation ([place]) 12/30/7251   Chronic systolic CHF (congestive heart failure) ([place]) 11/25/2021   At high risk for falls 11/25/2021   Acute on chronic systolic CHF (congestive heart failure) ([place]) 10/31/2021   Type II diabetes mellitus with renal manifestations ([place]) 10/31/2021   Stroke ([place]) 10/31/2021   Acute respiratory failure with hypoxia ([place]) 10/31/2021   PAD (peripheral artery disease) (HCC) 05/21/2021   Valvular heart disease 66/44/0347   Acute systolic congestive heart failure ([place]) 05/21/2021   Cerebral infarction involving left cerebellar artery ([place]) 05/21/2021   Dyspnea 05/15/2021   Generalized weakness 05/14/2021   Stage 3a chronic kidney disease ([place]) 11/29/2020   Hypertension associated with diabetes ([place]) 11/29/2020   Aortic atherosclerosis ([place]) 11/29/2020   Tubulovillous adenoma of rectum 03/05/2020   Vitamin D  deficiency 06/09/2019   Atrial fibrillation with RVR ([place]) 06/09/2019   Leukocytosis 06/09/2019   Acute deep vein thrombosis (DVT) of distal vein of left lower extremity (HCC) 06/09/2019   Leg edema 06/09/2019   Diabetes mellitus type 2 in obese (HCC)    Rectal mass 05/17/2019   Insomnia 09/30/2018   Abnormal gait 09/30/2018   Bilateral impacted cerumen 09/30/2018   Chronic diarrhea 09/30/2018   Type 2 diabetes mellitus with stage 3 chronic kidney disease, without long-term current use of insulin ([place]) 09/30/2018   Hyperlipidemia 09/30/2018   Nosebleed 09/30/2018   Does use hearing aid 09/30/2018   Positive colorectal cancer screening using Cologuard test 08/12/2018   HTN (hypertension) 08/12/2018    Orientation RESPIRATION BLADDER Height & Weight     Self, Time, Situation, Place  Normal Continent, External catheter Weight: 80.1 kg Height:  '5\' 4"'$  (162.6 cm)  BEHAVIORAL SYMPTOMS/MOOD NEUROLOGICAL BOWEL NUTRITION STATUS      Continent Diet (See Dc summary)  AMBULATORY STATUS COMMUNICATION OF NEEDS Skin   Extensive Assist Verbally Normal, Surgical wounds                       Personal Care Assistance Level of Assistance  Bathing, Feeding, Dressing Bathing Assistance: Maximum assistance Feeding assistance: Limited assistance Dressing Assistance: Maximum assistance     Functional Limitations Info  Hearing, Sight Sight Info: Impaired Hearing Info: Impaired      SPECIAL CARE FACTORS FREQUENCY  PT (By licensed PT), OT (By licensed OT)     PT Frequency: 5 times per week OT Frequency: 5 times per week  Contractures Contractures Info: Not present    Additional Factors Info  Code Status, Allergies Code Status Info: DNR Allergies Info: NKDA           Current Medications (09/22/2022):  This is the current hospital active medication list Current Facility-Administered Medications  Medication Dose Route Frequency Provider Last Rate Last Admin   0.9 %   sodium chloride infusion   Intravenous Continuous Sharen Hones, MD 50 mL/hr at 09/22/22 1347 Rate Change at 09/22/22 1347   acetaminophen (TYLENOL) tablet 325-650 mg  325-650 mg Oral Q6H PRN Thornton Park, MD       acetaminophen (TYLENOL) tablet 500 mg  500 mg Oral Q6H Thornton Park, MD   500 mg at 09/22/22 4196   alum & mag hydroxide-simeth (MAALOX/MYLANTA) 200-200-20 MG/5ML suspension 30 mL  30 mL Oral Q4H PRN Thornton Park, MD       apixaban Arne [place]) tablet 5 mg  5 mg Oral BID Thornton Park, MD   5 mg at 09/22/22 0902   bisacodyl (DULCOLAX) suppository 10 mg  10 mg Rectal Daily PRN Thornton Park, MD   10 mg at 09/22/22 1411   cholecalciferol (VITAMIN D3) 25 MCG (1000 UNIT) tablet 2,000 Units  2,000 Units Oral Daily Thornton Park, MD   2,000 Units at 09/19/22 1004   digoxin (LANOXIN) tablet 0.0625 mg  0.0625 mg Oral Daily Thornton Park, MD   0.0625 mg at 09/21/22 2229   docusate sodium (COLACE) capsule 100 mg  100 mg Oral BID Thornton Park, MD   100 mg at 09/21/22 2248   feeding supplement (ENSURE ENLIVE / ENSURE PLUS) liquid 237 mL  237 mL Oral BID BM Thornton Park, MD   237 mL at 09/22/22 0901   furosemide (LASIX) injection 40 mg  40 mg Intravenous Once Sharen Hones, MD       labetalol (NORMODYNE) injection 5 mg  5 mg Intravenous Q3H PRN Thornton Park, MD       lactulose (CHRONULAC) 10 GM/15ML solution 20 g  20 g Oral BID PRN Sharen Hones, MD       LORazepam (ATIVAN) injection 0.5 mg  0.5 mg Intravenous TID PRN Thornton Park, MD   0.5 mg at 09/20/22 0453   menthol-cetylpyridinium (CEPACOL) lozenge 3 mg  1 lozenge Oral PRN Thornton Park, MD       Or   phenol (CHLORASEPTIC) mouth spray 1 spray  1 spray Mouth/Throat PRN Thornton Park, MD       methocarbamol (ROBAXIN) tablet 500 mg  500 mg Oral Q6H PRN Thornton Park, MD       Or   methocarbamol (ROBAXIN) 500 mg in dextrose 5 % 50 mL IVPB  500 mg Intravenous Q6H PRN Thornton Park, MD       metoprolol  succinate (TOPROL-XL) 24 hr tablet 50 mg  50 mg Oral Daily Thornton Park, MD   50 mg at 09/21/22 7989   And   metoprolol succinate (TOPROL-XL) 24 hr tablet 25 mg  25 mg Oral QPM Thornton Park, MD   25 mg at 09/21/22 1736   morphine (PF) 2 MG/ML injection 0.5-1 mg  0.5-1 mg Intravenous Q2H PRN Thornton Park, MD       multivitamin with minerals tablet 1 tablet  1 tablet Oral Daily Thornton Park, MD   1 tablet at 09/22/22 0856   ondansetron (ZOFRAN) tablet 4 mg  4 mg Oral Q6H PRN Thornton Park, MD       Or   ondansetron [facility]) injection 4 mg  4 mg Intravenous Q6H PRN Thornton Park, MD       oxyCODONE (Oxy IR/ROXICODONE) immediate release tablet 5-10 mg  5-10 mg Oral Q4H PRN Thornton Park, MD   10 mg at 09/22/22 1549   polyethylene glycol (MIRALAX / GLYCOLAX) packet 17 g  17 g Oral Daily PRN Thornton Park, MD       pravastatin (PRAVACHOL) tablet 20 mg  20 mg Oral q1800 Thornton Park, MD   20 mg at 09/21/22 1737   QUEtiapine (SEROQUEL) tablet 25 mg  25 mg Oral QHS Thornton Park, MD   25 mg at 09/21/22 2248   senna-docusate (Senokot-S) tablet 1 tablet  1 tablet Oral QHS PRN Thornton Park, MD       traMADol Veatrice Bourbon) tablet 50 mg  50 mg Oral Q6H Thornton Park, MD   50 mg at 09/21/22 1736     Discharge Medications: Please see discharge summary for a list of discharge medications.  Relevant Imaging Results:  Relevant Lab Results:   Additional Information SS# [id number]  Conception Oms, RN

## 2022-09-22 NOTE — Evaluation (Signed)
Occupational Therapy Evaluation Patient Details Name: Heidi Whitaker MRN: [id number] DOB: September 07, 1929 Today's Date: 09/22/2022   History of Present Illness Heidi Whitaker is a 88 y.o. female with past medical history of hypertension, hyperlipidemia, diabetes, atrial fibrillation on Eliquis, DVT, CKD, CHF, and stroke who presents to the ED following fall.  Patient reports that she was trying to put on her sweater when she states that she turned around too fast, causing her to lose her balance.  She does not think she hit her head and denies any loss of consciousness.  She does report hitting her left hip with significant pain just below the hip since then.  She has been unable to bear weight on her left leg, denies any pain in her right leg or her upper extremities.  She is s/p L IM nailing for hip, TTWB on LLE.   Clinical Impression   Pt was seen for OT evaluation and co-tx with PT this date. Prior to hospital admission, pt reports living with her daughter and walking with a walker, able to complete ADL without assist. No family/caregivers present to verify home/PLOF. Pt initially sleepy but alertness improves, able to follow simple commands, complicated by [facility] (R ear better than L w/ hearing aide). Pt presents to acute OT demonstrating impaired ADL performance and functional mobility 2/2 decreased cognition, strength, balance, L hip pain, and activity tolerance (See OT problem list). Pt currently requires MAX A +2 for bed mobility, CGA to minA sitting EOB with VC's for hand placement, MODA +2 for ADL transfers with RW, MAX A for seated LB ADL, and MIN A for seated UB ADL tasks. Pt endorsing pain in L hip and mild dizziness. Supine BP 93/59, sitting BP 101/73. RN notified. Pt would benefit from skilled OT services to address noted impairments and functional limitations (see below for any additional details) in order to maximize safety and independence while minimizing falls risk and caregiver  burden. Upon hospital discharge, recommend STR to maximize pt safety and return to PLOF.     Recommendations for follow up therapy are one component of a multi-disciplinary discharge planning process, led by the attending physician.  Recommendations may be updated based on patient status, additional functional criteria and insurance authorization.   Follow Up Recommendations  Skilled nursing-short term rehab (<3 hours/day)    Assistance Recommended at Discharge Frequent or constant Supervision/Assistance  Patient can return home with the following Two people to help with walking and/or transfers;A lot of help with bathing/dressing/bathroom;Assistance with cooking/housework;Assist for transportation;Help with stairs or ramp for entrance;Direct supervision/assist for medications management;Direct supervision/assist for financial management    Functional Status Assessment  Patient has had a recent decline in their functional status and demonstrates the ability to make significant improvements in function in a reasonable and predictable amount of time.  Equipment Recommendations  Other (comment) (defer to next venue, likely [facility])    Recommendations for Other Services       Precautions / Restrictions Precautions Precautions: Fall Restrictions Weight Bearing Restrictions: Yes LLE Weight Bearing: Touchdown weight bearing Other Position/Activity Restrictions: TDWBing LLE      Mobility Bed Mobility Overal bed mobility: Needs Assistance Bed Mobility: Supine to Sit, Sit to Supine     Supine to sit: Max assist, +2 for physical assistance Sit to supine: Max assist, +2 for physical assistance        Transfers Overall transfer level: Needs assistance Equipment used: Rolling walker (2 wheels) Transfers: Sit to/from Stand Sit to Stand: Mod  assist, +2 physical assistance, From elevated surface           General transfer comment: Unable to maintain LLE TTWB precautions. LImited  quad and hip extension in standing with heavy UE use on RW and external support from PT/OT      Balance Overall balance assessment: Needs assistance Sitting-balance support: Bilateral upper extremity supported, Feet supported Sitting balance-Leahy Scale: Poor Sitting balance - Comments: Required consistent CGA to minA throughut with VC's for safe hand placement. Brief periods where pt able to maintain static sitting balance with SBA.   Standing balance support: Bilateral upper extremity supported Standing balance-Leahy Scale: Zero                             ADL either performed or assessed with clinical judgement   ADL                                         General ADL Comments: Pt currently requires MAX A for LB ADL tasks, MIN A for seated UB ADL tasks, and MOD A +2 for ADL transfer attempts     Vision         Perception     Praxis      Pertinent Vitals/Pain Pain Assessment Pain Assessment: Faces Faces Pain Scale: Hurts even more Pain Location: L hip with mobility Pain Descriptors / Indicators: Discomfort, Grimacing, Guarding, Moaning, Operative site guarding Pain Intervention(s): Limited activity within patient's tolerance, Monitored during session, Repositioned, Ice applied, Patient requesting pain meds-RN notified     Hand Dominance     Extremity/Trunk Assessment Upper Extremity Assessment Upper Extremity Assessment: Generalized weakness   Lower Extremity Assessment Lower Extremity Assessment: Generalized weakness;LLE deficits/detail LLE Deficits / Details: L hip fx, TTWB precautions       Communication Communication Communication: HOH   Cognition Arousal/Alertness: Lethargic Behavior During Therapy: WFL for tasks assessed/performed, Flat affect Overall Cognitive Status: History of cognitive impairments - at baseline                                 General Comments: hx of vascular dementia. HoH with lethargy so  hard to determine cognition. Agreeable to participate to her abilities. Answers simple questions, follows 1 step commands when heard with multimodal cuing.     General Comments  SPO2 > 90%. HR mid 100's BPM with activity.    Exercises Other Exercises Other Exercises: Pt educated in [place] precautions for ADL transfers, unable to maintain   Shoulder Instructions      Home Living Family/patient expects to be discharged to:: Private residence Living Arrangements: Children (pt reports living with her daughter) Available Help at Discharge: Family Type of Home: House Home Access: Stairs to enter [employer] of Steps: pt reports 3, but chart states 4-5 Entrance Stairs-Rails: Left;Right Home Layout: Two level;Bed/bath upstairs Alternate Level Stairs-Number of Steps: flight Alternate Level Stairs-Rails: Left Bathroom Shower/Tub: Walk-in Psychologist, prison and probation services: Standard     Home Equipment: Conservation officer, nature (2 wheels)   Additional Comments: Pt reports living with daughter. States she thinks she has a RW. No family present to verify home set up and PLOF      Prior Functioning/Environment Prior Level of Function : Independent/Modified Independent  Mobility Comments: Ambulates without AD at baseline per patient ADLs Comments: Pt reports able to complete ADL tasks        OT Problem List: Decreased strength;Pain;Decreased cognition;Decreased safety awareness;Impaired balance (sitting and/or standing);Decreased activity tolerance;Decreased knowledge of use of DME or AE;Decreased knowledge of precautions;Decreased range of motion      OT Treatment/Interventions: Self-care/ADL training;Therapeutic activities;Therapeutic exercise;DME and/or AE instruction;Patient/family education;Balance training;Cognitive remediation/compensation    OT Goals(Current goals can be found in the care plan section) Acute Rehab OT Goals Patient Stated Goal: have less pain OT Goal  Formulation: With patient Time For Goal Achievement: 10/06/22 Potential to Achieve Goals: Good ADL Goals Pt Will Perform Lower Body Dressing: with mod assist;sit to/from stand Pt Will Transfer to Toilet: with mod assist;stand pivot transfer;bedside commode Pt Will Perform Toileting - Clothing Manipulation and hygiene: sitting/lateral leans;with min guard assist;with set-up Additional ADL Goal #1: Pt will complete seated grooming tasks EOB without back support with set up and supervision, >73mn with no LOB, 3/3 opportunities.  OT Frequency: Min 2X/week    Co-evaluation PT/OT/SLP Co-Evaluation/Treatment: Yes Reason for Co-Treatment: To address functional/ADL transfers;For patient/therapist safety PT goals addressed during session: Mobility/safety with mobility OT goals addressed during session: ADL's and self-care      AM-PAC OT "6 Clicks" Daily Activity     Outcome Measure Help from another person eating meals?: A Little Help from another person taking care of personal grooming?: A Little Help from another person toileting, which includes using toliet, bedpan, or urinal?: A Lot Help from another person bathing (including washing, rinsing, drying)?: A Lot Help from another person to put on and taking off regular upper body clothing?: A Little Help from another person to put on and taking off regular lower body clothing?: A Lot 6 Click Score: 15   End of Session Equipment Utilized During Treatment: Gait belt;Rolling walker (2 wheels);Oxygen Nurse Communication: Patient requests pain meds;Mobility status  Activity Tolerance: Patient limited by pain Patient left: in bed;with call bell/phone within reach;with bed alarm set;with nursing/sitter in room  OT Visit Diagnosis: Other abnormalities of gait and mobility (R26.89);Muscle weakness (generalized) (M62.81);Pain;History of falling (Z91.81) Pain - Right/Left: Left Pain - part of body: Hip;Leg                Time: 01941-7408OT Time  Calculation (min): 31 min Charges:  OT General Charges $OT Visit: 1 Visit OT Evaluation $OT Eval Moderate Complexity: 1 Mod OT Treatments $Self Care/Home Management : 8-22 mins  JArdeth Perfect, MPH, MS, OTR/L ascom [phone]/26/23, 1:51 PM

## 2022-09-22 NOTE — Progress Notes (Signed)
  Progress Note   Patient: Heidi Whitaker QJJ:[id number] DOB: 03-28-1929 DOA: 09/18/2022     4 DOS: the patient was seen and examined on 09/22/2022   Brief hospital course: [name] is a 88 year old female with history of non-insulin-dependent diabetes mellitus, hypertension, hyperlipidemia, persistent atrial fibrillation on Eliquis and digoxin, history of stroke, history of DVT, chronic diarrhea, status post colostomy secondary to benign tumors, who presents emergency department for chief concerns of unwitnessed mechanical fall. Left hip with and without pelvis x-ray was read as: Severely displaced and comminuted proximal left femoral shaft fracture. Surgery on 9/25. Postoperatively, patient developed some volume overload, increased confusion and agitation. Discharged to nursing home when mental status improves.  May consider hospice if patient does not improve with mental status or does not eat.   Assessment and Plan: Left femoral shaft fracture (HCC) Mechanical fall Patient is seen by orthopedics, recommended surgery with internal fixation.   Patient going to surgery today.   Acute on chronic systolic congestive heart failure. Patient was placed on 2 L oxygen last night, patient has short of breath today.  Examination showed crackles in the base.  Patient appears to have lost volume overload.  Patient has not been eating, will continue reduced dose of IV fluids.  We will give IV Lasix x1, reassess if additional doses needed.   Persistent atrial fibrillation (HCC) Acute deep vein thrombosis (DVT) of distal vein of left lower extremity (HCC) Resume the Eliquis after surgery.  On lower dose metoprolol and digoxin.   History of stroke. Vascular dementia with the behavior disturbance. Failure to thrive. Insomnia. Patient condition has been deteriorating recently.  She has significant confusion and memory issues.  This most likely due to vascular dementia.   Patient is very  drowsy today, anticipating worsening agitation at nighttime.  On Seroquel nightly. Discussed with patient daughter, patient condition has been deteriorating at home.  If patient does not improve with her appetite, not eating, may have to consider possibility hospice versus comfort care.   Colostomy in place [facility]) Continue to follow.   Stage 3a chronic kidney disease (HCC) Stable.   HTN (hypertension) Continue home medicines.      Subjective:  Patient is very sleepy today, opening eyes only.  On 2 L oxygen, seems to be short of breath with exertion.  Physical Exam: Vitals:   09/21/22 2034 09/22/22 0046 09/22/22 0514 09/22/22 0914  BP: 96/65 107/74 91/60 108/70  Pulse: 88 89 63 84  Resp: '17 19 16 16  '$ Temp: 97.6 F (36.4 C) 99 F (37.2 C) 98.9 F (37.2 C) (!) 97.5 F (36.4 C)  TempSrc:      SpO2: 93% 94% 92% 98%  Weight:      Height:       General exam: Ill-appearing. Respiratory system: Fine crackles in the base. Respiratory effort normal. Cardiovascular system: Irregular. No JVD, murmurs, rubs, gallops or clicks. No pedal edema. Gastrointestinal system: Abdomen is nondistended, soft and nontender. No organomegaly or masses felt. Normal bowel sounds heard. Central nervous system: Drowsy and confused. Extremities: Symmetric 5 x 5 power. Skin: No rashes, lesions or ulcers    Data Reviewed:  Lab results reviewed.  Family Communication: Daughter and son-in-law updated at bedside.  Disposition: Status is: Inpatient Remains inpatient appropriate because: Severity of disease, IV treatment, postop.  Planned Discharge Destination: Skilled nursing facility    Time spent: 55 minutes  Author: Sharen Hones, MD 09/22/2022 1:24 PM  For on call review [email or web address].

## 2022-09-22 NOTE — Evaluation (Signed)
Physical Therapy Evaluation Patient Details Name: Heidi Whitaker MRN: [id number] DOB: 1929/03/10 Today's Date: 09/22/2022  History of Present Illness  Heidi Whitaker is a 88 y.o. female with past medical history of hypertension, hyperlipidemia, diabetes, atrial fibrillation on Eliquis, DVT, CKD, CHF, and stroke who presents to the ED following fall.  Patient reports that she was trying to put on her sweater when she states that she turned around too fast, causing her to lose her balance.  She does not think she hit her head and denies any loss of consciousness.  She does report hitting her left hip with significant pain just below the hip since then.  She has been unable to bear weight on her left leg, denies any pain in her right leg or her upper extremities.  She is s/p L IM nailing for hip, TTWB on LLE.   Clinical Impression  Pt admitted with above diagnosis. Pt received upright in bed agreeable to PT/OT co-eval. Pt has baseline history of cognitive deficits (vascular dementia) and extremely HoH limiting communication with pt and fully assessing cognitive function. Reliant on previous EMR for PLOF, home lay out, assist levels available, and DME. Per pt she is indep at baseline living with daughter. To date, pt reliant on mod to max multimodal cuing throughout session to participate. Pt maxA+2 for all bed mobility, CGA to minA sitting EOB with VC's for hand placement, and modA+2 for STS at [facility] with inability to maintain TTWB precautions on LLE with limited knee and hip extension tolerating ~10 sec standing prior to returning to sitting. Pt then returned to supine in bed. All needs in reach with RN at bedside. Pt will benefit from STR placement due to need for 2 person assist, inability to safely maintain LLE WB precautions for functional mobility. Pt currently with functional limitations due to the deficits listed below (see PT Problem List). Pt will benefit from skilled PT to increase their  independence and safety with mobility to allow discharge to the venue listed below.      Recommendations for follow up therapy are one component of a multi-disciplinary discharge planning process, led by the attending physician.  Recommendations may be updated based on patient status, additional functional criteria and insurance authorization.  Follow Up Recommendations Skilled nursing-short term rehab (<3 hours/day) Can patient physically be transported by private vehicle: No    Assistance Recommended at Discharge Frequent or constant Supervision/Assistance  Patient can return home with the following  Two people to help with bathing/dressing/bathroom;Two people to help with walking and/or transfers;Assist for transportation;Assistance with cooking/housework;Help with stairs or ramp for entrance;Direct supervision/assist for medications management    Equipment Recommendations Other (comment) (TBD by next venue of care)  Recommendations for Other Services       Functional Status Assessment Patient has had a recent decline in their functional status and/or demonstrates limited ability to make significant improvements in function in a reasonable and predictable amount of time     Precautions / Restrictions Precautions Precautions: Fall Restrictions Weight Bearing Restrictions: Yes LLE Weight Bearing:  (TTWB)      Mobility  Bed Mobility Overal bed mobility: Needs Assistance Bed Mobility: Supine to Sit, Sit to Supine     Supine to sit: Max assist, +2 for physical assistance Sit to supine: Max assist, +2 for physical assistance     Patient Response: Cooperative, Flat affect  Transfers Overall transfer level: Needs assistance Equipment used: Rolling walker (2 wheels) Transfers: Sit to/from Stand Sit to Stand:  Mod assist, +2 physical assistance, From elevated surface           General transfer comment: Unable to maintain LLE TTWB precautions. LImited quad and hip extension  in standing with heavy UE use on RW and external support from PT/OT    Ambulation/Gait               General Gait Details: deferred due to safety  Stairs            Wheelchair Mobility    Modified Rankin (Stroke Patients Only)       Balance Overall balance assessment: Needs assistance Sitting-balance support: Bilateral upper extremity supported, Feet supported Sitting balance-Leahy Scale: Poor Sitting balance - Comments: Required consistent CGA to minA throughut with VC's for safe hand placement. Brief periods where pt able to maintain static sitting balance with SBA.     Standing balance-Leahy Scale: Zero                               Pertinent Vitals/Pain Pain Assessment Pain Assessment: Faces Faces Pain Scale: Hurts even more Pain Location: L hip with mobility Pain Descriptors / Indicators: Discomfort, Grimacing, Guarding, Moaning, Operative site guarding Pain Intervention(s): Limited activity within patient's tolerance, Repositioned, Ice applied    Home Living Family/patient expects to be discharged to:: Private residence Living Arrangements: Children Available Help at Discharge: Family Type of Home: House Home Access: Stairs to enter Entrance Stairs-Rails: Chemical engineer of Steps: pt reports 3, but chart states 4-5 Alternate Level Stairs-Number of Steps: flight Home Layout: Two level;Bed/bath upstairs Home Equipment: Rolling Walker (2 wheels) Additional Comments: Pt reports living with daughter. States she thinks she has a RW.    Prior Function Prior Level of Function : Independent/Modified Independent             Mobility Comments: Ambulates without AD at baseline per patient       Hand Dominance        Extremity/Trunk Assessment   Upper Extremity Assessment Upper Extremity Assessment: Defer to OT evaluation    Lower Extremity Assessment Lower Extremity Assessment: Generalized weakness;LLE  deficits/detail LLE Deficits / Details: L hip fx, TTWB precautions       Communication   Communication: HOH  Cognition Arousal/Alertness: Lethargic   Overall Cognitive Status: History of cognitive impairments - at baseline                                 General Comments: hx of vascular dementia. HoH with lethargy so hard to determine cognition. Agreeable to participate to her abilities. Answers simple questions, follows 1 step commands when heard with multimodal cuing.        General Comments General comments (skin integrity, edema, etc.): SPO2 > 90%. HR mid 100's BPM with activity.    Exercises Other Exercises Other Exercises: Role of PT in acute setting, WB precautions, bed mobility, transfers, safe use of RW   Assessment/Plan    PT Assessment Patient needs continued PT services  PT Problem List Decreased strength;Decreased cognition;Decreased range of motion;Decreased knowledge of use of DME;Decreased activity tolerance;Decreased balance;Decreased knowledge of precautions;Pain;Decreased mobility       PT Treatment Interventions DME instruction;Balance training;Gait training;Neuromuscular re-education;Functional mobility training;Patient/family education;Stair training;Therapeutic activities;Therapeutic exercise    PT Goals (Current goals can be found in the Care Plan section)  Acute Rehab PT Goals PT Goal Formulation: Patient  unable to participate in goal setting    Frequency 7X/week     Co-evaluation               AM-PAC PT "6 Clicks" Mobility  Outcome Measure Help needed turning from your back to your side while in a flat bed without using bedrails?: A Lot Help needed moving from lying on your back to sitting on the side of a flat bed without using bedrails?: A Lot Help needed moving to and from a bed to a chair (including a wheelchair)?: Total Help needed standing up from a chair using your arms (e.g., wheelchair or bedside chair)?: A  Lot Help needed to walk in hospital room?: Total Help needed climbing 3-5 steps with a railing? : Total 6 Click Score: 9    End of Session Equipment Utilized During Treatment: Gait belt;Oxygen Activity Tolerance: Patient limited by lethargy;Patient limited by pain Patient left: in bed;with call bell/phone within reach;with bed alarm set;with nursing/sitter in room;with SCD's reapplied Nurse Communication: Mobility status PT Visit Diagnosis: Other abnormalities of gait and mobility (R26.89);Muscle weakness (generalized) (M62.81);Difficulty in walking, not elsewhere classified (R26.2);Pain Pain - Right/Left: Left Pain - part of body: Hip;Leg    Time: 1700-1749 PT Time Calculation (min) (ACUTE ONLY): 31 min   Charges:   PT Evaluation $PT Eval Moderate Complexity: 1 Mod PT Treatments $Therapeutic Activity: 8-22 mins       Suheyb Raucci M. Fairly IV, PT, DPT Physical Therapist- [facility]  09/22/2022, 10:13 AM

## 2022-09-22 NOTE — Plan of Care (Signed)

## 2022-09-22 NOTE — Progress Notes (Signed)
Called to bedside by PT requesting I give meds for pain, tramadol pulled; however, when meds placed in applesauce and attempted to give patient, patient spit them back out on herself. Dr Roosevelt Locks MD made aware. Also made Dr aware of hemoglobin change and patient's current inability to work w/ PT due to pain intolerance, sleepiness, and as mentioned, med refusal.

## 2022-09-22 NOTE — Progress Notes (Signed)
Subjective:  POD #1 s/p intramedullary fixation for left femur fracture.   Patient reports left thigh pain as mild to moderate.  Patient sitting up in bed eating dinner.  Is in no acute distress.  Objective:   VITALS:   Vitals:   09/22/22 0046 09/22/22 0514 09/22/22 0914 09/22/22 1557  BP: 107/74 91/60 108/70 96/65  Pulse: 89 63 84 87  Resp: '19 16 16 17  '$ Temp: 99 F (37.2 C) 98.9 F (37.2 C) (!) 97.5 F (36.4 C) 97.7 F (36.5 C)  TempSrc:    Oral  SpO2: 94% 92% 98% 97%  Weight:      Height:        PHYSICAL EXAM: Left lower extremity Neurovascular intact Sensation intact distally Intact pulses distally Dorsiflexion/Plantar flexion intact Incision: dressing C/D/I No cellulitis present Compartment soft  LABS  Results for orders placed or performed during the hospital encounter of 09/18/22 (from the past 24 hour(s))  CBC     Status: Abnormal   Collection Time: 09/22/22  3:56 AM  Result Value Ref Range   WBC 14.3 (H) 4.0 - 10.5 K/uL   RBC 2.70 (L) 3.87 - 5.11 MIL/uL   Hemoglobin 8.5 (L) 12.0 - 15.0 g/dL   HCT 25.8 (L) 36.0 - 46.0 %   MCV 95.6 80.0 - 100.0 fL   MCH 31.5 26.0 - 34.0 pg   MCHC 32.9 30.0 - 36.0 g/dL   RDW 13.3 11.5 - 15.5 %   Platelets 176 150 - 400 K/uL   nRBC 0.0 0.0 - 0.2 %  Basic metabolic panel     Status: Abnormal   Collection Time: 09/22/22  3:56 AM  Result Value Ref Range   Sodium 136 135 - 145 mmol/L   Potassium 4.3 3.5 - 5.1 mmol/L   Chloride 101 98 - 111 mmol/L   CO2 28 22 - 32 mmol/L   Glucose, Bld 234 (H) 70 - 99 mg/dL   BUN 29 (H) 8 - 23 mg/dL   Creatinine, Ser 1.03 (H) 0.44 - 1.00 mg/dL   Calcium 8.4 (L) 8.9 - 10.3 mg/dL   GFR, Estimated 52 (L) >60 mL/min   Anion gap 7 5 - 15    DG FEMUR MIN 2 VIEWS LEFT  Result Date: 09/21/2022 CLINICAL DATA:  Femur fracture, status post intramedullary nail EXAM: LEFT FEMUR 2 VIEWS COMPARISON:  09/18/2022 FINDINGS: Status post intramedullary nail fixation of comminuted fractures of the  proximal left femoral metadiaphysis, with near anatomic fracture alignment. Expected overlying postoperative change. IMPRESSION: Status post intramedullary nail fixation of comminuted fractures of the proximal left femoral metadiaphysis, with near anatomic fracture alignment. Electronically Signed   By: Delanna Ahmadi M.D.   On: 09/21/2022 16:20   DG HIP UNILAT WITH PELVIS 2-3 VIEWS LEFT  Result Date: 09/21/2022 CLINICAL DATA:  Left IM nail EXAM: DG HIP (WITH OR WITHOUT PELVIS) 2-3V LEFT COMPARISON:  Hip radiograph 09/18/2022 FINDINGS: Intraoperative images during left femur intramedullary nailing. Improved fracture alignment without evidence of immediate complication. Vascular stent noted. IMPRESSION: Intraoperative images during left femur intramedullary nailing. Improved, near anatomic fracture alignment. No evidence of immediate hardware complication. Electronically Signed   By: Maurine Simmering M.D.   On: 09/21/2022 15:39   DG C-Arm 1-60 Min-No Report  Result Date: 09/21/2022 Fluoroscopy was utilized by the requesting physician.  No radiographic interpretation.    Assessment/Plan: 1 Day Post-Op   Principal Problem:   Left femoral shaft fracture (HCC) Active Problems:   HTN (hypertension)   Type  2 diabetes mellitus with stage 3 chronic kidney disease, without long-term current use of insulin (HCC)   Hyperlipidemia   Vitamin D deficiency   Leukocytosis   Acute deep vein thrombosis (DVT) of distal vein of left lower extremity (HCC)   Stage 3a chronic kidney disease (HCC)   Hypertension associated with diabetes ([place])   PAD (peripheral artery disease) (HCC)   Valvular heart disease   Acute on chronic systolic CHF (congestive heart failure) (HCC)   Chronic systolic CHF (congestive heart failure) (HCC)   Colostomy in place [facility])   Persistent atrial fibrillation ([place])   History of stroke   Vascular dementia ([place])   Failure to thrive in adult   Obesity (BMI 30-39.9)   Dementia with behavioral  disturbance [facility])  Patient is stable from an orthopedic standpoint.  Her hemoglobin today is 8.5.  Labs will be rechecked tomorrow morning.  Patient given 6 today for CHF per the hospital service.  Patient has restarted her baseline Eliquis.  And is toe-touch weightbearing on the left lower extremity given the comminution of her femur fracture.  Will require a skilled nursing facility upon discharge.   Th[place]ton [place] , MD 09/22/2022, 6:45 PM

## 2022-09-22 NOTE — TOC Progression Note (Signed)
Transition of Care (TOC) - Progression Note    Patient Details  Name: Loyal B Jentz MRN: [id number] Date of Birth: 03/20/1929  Transition of Care (TOC) CM/SW Contact   J Louvet, RN Phone Number: 09/22/2022, 3:49 PM  Clinical Narrative:    Met with the patient and her family in the room, They are agreeable to go to STR SNF Bedsearch sent, FL2 completed, PASSR obtained Will review the bed offers once obtained        Expected Discharge Plan and Services                                                 Social Determinants of Health (SDOH) Interventions    Readmission Risk Interventions     No data to display          

## 2022-09-23 ENCOUNTER — Telehealth: Payer: Self-pay | Admitting: Licensed Clinical Social Worker

## 2022-09-23 DIAGNOSIS — Z933 Colostomy status: Secondary | ICD-10-CM | POA: Diagnosis not present

## 2022-09-23 DIAGNOSIS — Z515 Encounter for palliative care: Secondary | ICD-10-CM

## 2022-09-23 DIAGNOSIS — I5023 Acute on chronic systolic (congestive) heart failure: Secondary | ICD-10-CM | POA: Diagnosis not present

## 2022-09-23 DIAGNOSIS — I5022 Chronic systolic (congestive) heart failure: Secondary | ICD-10-CM | POA: Diagnosis not present

## 2022-09-23 DIAGNOSIS — Z7189 Other specified counseling: Secondary | ICD-10-CM

## 2022-09-23 DIAGNOSIS — S72352A Displaced comminuted fracture of shaft of left femur, initial encounter for closed fracture: Secondary | ICD-10-CM | POA: Diagnosis not present

## 2022-09-23 DIAGNOSIS — Z66 Do not resuscitate: Secondary | ICD-10-CM

## 2022-09-23 LAB — BASIC METABOLIC PANEL
Anion gap: 7 (ref 5–15)
BUN: 29 mg/dL — ABNORMAL HIGH (ref 8–23)
CO2: 27 mmol/L (ref 22–32)
Calcium: 8.2 mg/dL — ABNORMAL LOW (ref 8.9–10.3)
Chloride: 103 mmol/L (ref 98–111)
Creatinine, Ser: 0.96 mg/dL (ref 0.44–1.00)
GFR, Estimated: 56 mL/min — ABNORMAL LOW (ref >60)
Glucose, Bld: 199 mg/dL — ABNORMAL HIGH (ref 70–99)
Potassium: 3.9 mmol/L (ref 3.5–5.1)
Sodium: 137 mmol/L (ref 135–145)

## 2022-09-23 LAB — CBC
HCT: 22.4 % — ABNORMAL LOW (ref 36.0–46.0)
Hemoglobin: 7.5 g/dL — ABNORMAL LOW (ref 12.0–15.0)
MCH: 31.6 pg (ref 26.0–34.0)
MCHC: 33.5 g/dL (ref 30.0–36.0)
MCV: 94.5 fL (ref 80.0–100.0)
Platelets: 178 10*3/uL (ref 150–400)
RBC: 2.37 MIL/uL — ABNORMAL LOW (ref 3.87–5.11)
RDW: 13.8 % (ref 11.5–15.5)
WBC: 16.1 10*3/uL — ABNORMAL HIGH (ref 4.0–10.5)
nRBC: 0.4 % — ABNORMAL HIGH (ref 0.0–0.2)

## 2022-09-23 LAB — MAGNESIUM: Magnesium: 2.2 mg/dL (ref 1.7–2.4)

## 2022-09-23 NOTE — Assessment & Plan Note (Signed)
Will complicate overall prognosis especially her behavioral and participation with therapy

## 2022-09-23 NOTE — Telephone Encounter (Signed)
H&V Care Navigation CSW Progress Note  Outpatient Clinical Social Worker  was contacted by [employer]  to f/u on inquiry from pt family. Heidi Whitaker [phone]) shared that pt family having some confusion regarding whether or not pt's assets would be utilized during SNF stay as that is plan per notes/report for discharge. LCSW clarified that SNF would first utilize Medicare benefits, but if pt nonparticipatory with therapy or they felt she had received max benefits/stay becomes LTC then they may move pt to private pay status and they would need to utilize assets to cover that cost. LCSW did advise the best thing to do would be to speak with an eldercare lawyer if they needed to discuss how best to allocate/utilize assets for care planning. DSS may also be able to assist with questions but per report it does not seem pt would qualify for Medicaid. Our team remains available as needed- further discharge planning I defer to [facility] team.   Patient is participating in a Managed Medicaid Plan:  No, A[employer]na Medicare only.   SDOH Screenings   Food Insecurity: No Food Insecurity (11/26/2021)  Housing: Low Risk  (11/26/2021)  Transportation Needs: No Transportation Needs (11/26/2021)  Depression (PHQ2-9): Low Risk  (11/26/2021)  Financial Resource Strain: Low Risk  (11/26/2021)  Physical Activity: Insufficiently Active (11/26/2021)  Social Connections: Unknown (11/26/2021)  Stress: No Stress Concern Present (11/26/2021)  Tobacco Use: Medium Risk (09/22/2022)   Heidi Whitaker, MSW, LCSW Clinical Social Worker [place]  [phone]- work cell phone (preferred) [phone]- desk phone

## 2022-09-23 NOTE — Progress Notes (Addendum)
Physical Therapy  Patient Details Name: Heidi Whitaker MRN: [id number] DOB: 03/01/1929 Today's Date: 09/23/2022  History of Present Illness  Heidi Whitaker is a 88 y.o. female with past medical history of hypertension, hyperlipidemia, diabetes, atrial fibrillation on Eliquis, DVT, CKD, CHF, and stroke who presents to the ED following fall.  Patient reports that she was trying to put on her sweater when she states that she turned around too fast, causing her to lose her balance.  She does not think she hit her head and denies any loss of consciousness.  She does report hitting her left hip with significant pain just below the hip since then.  She has been unable to bear weight on her left leg, denies any pain in her right leg or her upper extremities.  She is s/p L IM nailing for hip, TTWB on LLE.   Clinical Impression  Patient received in bed, she is agreeable to PT session, however reporting a lot of pain with movement. Patient requires max assist with bed mobility and mod assist to raise trunk to seated position. Patient with poor sitting tolerance due to L LE pain. Patient will continue to benefit from skilled PT to improve functional independence, strength and activity tolerance.         Recommendations for follow up therapy are one component of a multi-disciplinary discharge planning process, led by the attending physician.  Recommendations may be updated based on patient status, additional functional criteria and insurance authorization.  Follow Up Recommendations Skilled nursing-short term rehab (<3 hours/day) Can patient physically be transported by private vehicle: No    Assistance Recommended at Discharge Frequent or constant Supervision/Assistance  Patient can return home with the following  Two people to help with bathing/dressing/bathroom;Two people to help with walking and/or transfers;Assist for transportation;Assistance with cooking/housework;Help with stairs or ramp for  entrance;Direct supervision/assist for medications management    Equipment Recommendations None recommended by PT;Other (comment) (TBD)  Recommendations for Other Services       Functional Status Assessment Patient has had a recent decline in their functional status and/or demonstrates limited ability to make significant improvements in function in a reasonable and predictable amount of time     Precautions / Restrictions Precautions Precautions: Fall Restrictions Weight Bearing Restrictions: Yes LLE Weight Bearing: Touchdown weight bearing      Mobility  Bed Mobility Overal bed mobility: Needs Assistance Bed Mobility: Supine to Sit, Sit to Supine     Supine to sit: Max assist, HOB elevated Sit to supine: Max assist        Transfers                   General transfer comment: unable. She was not able to tolerate sitting up on side of bed very well due to pain.    Ambulation/Gait               General Gait Details: unable  Stairs            Wheelchair Mobility    Modified Rankin (Stroke Patients Only)       Balance Overall balance assessment: Needs assistance Sitting-balance support: Bilateral upper extremity supported, Feet unsupported Sitting balance-Leahy Scale: Poor Sitting balance - Comments: Min/Mod A to raise trunk to seated position, Unable to maintain static standing without B UE support. Tolerance limited by pain and patient continually requesting to lie back down. Postural control: Right lateral lean     Standing balance comment: unable/unwilling to attempt due to  pain                             Pertinent Vitals/Pain Pain Assessment Pain Assessment: Faces Faces Pain Scale: Hurts whole lot Breathing: occasional labored breathing, short period of hyperventilation Negative Vocalization: occasional moan/groan, low speech, negative/disapproving quality Facial Expression: facial grimacing Body Language: tense,  distressed pacing, fidgeting Consolability: distracted or reassured by voice/touch PAINAD Score: 6 Pain Location: L hip with mobility Pain Descriptors / Indicators: Discomfort, Grimacing, Guarding, Moaning, Operative site guarding Pain Intervention(s): Monitored during session, Repositioned, Premedicated before session    Home Living                          Prior Function                       Hand Dominance        Extremity/Trunk Assessment                Communication      Cognition                                       General Comments: Patient is alert and oriented, hearing aides donned today, therefore improved communication. She lives with her daughter at baseline.        General Comments      Exercises Total Joint Exercises Ankle Circles/Pumps: AAROM, Left, 10 reps Heel Slides: AAROM, Left, 10 reps Hip ABduction/ADduction: AAROM, Left, 10 reps   Assessment/Plan    PT Assessment Patient needs continued PT services  PT Problem List Decreased strength;Decreased cognition;Decreased range of motion;Decreased knowledge of use of DME;Decreased activity tolerance;Decreased balance;Decreased knowledge of precautions;Pain;Decreased mobility;Decreased skin integrity;Cardiopulmonary status limiting activity       PT Treatment Interventions DME instruction;Balance training;Gait training;Neuromuscular re-education;Functional mobility training;Patient/family education;Stair training;Therapeutic activities;Therapeutic exercise    PT Goals (Current goals can be found in the Care Plan section)  Acute Rehab PT Goals Patient Stated Goal: decreased pain PT Goal Formulation: With patient Time For Goal Achievement: 10/08/22 Potential to Achieve Goals: Fair    Frequency 7X/week     Co-evaluation               AM-PAC PT "6 Clicks" Mobility  Outcome Measure Help needed turning from your back to your side while in a flat bed  without using bedrails?: A Lot Help needed moving from lying on your back to sitting on the side of a flat bed without using bedrails?: A Lot Help needed moving to and from a bed to a chair (including a wheelchair)?: Total Help needed standing up from a chair using your arms (e.g., wheelchair or bedside chair)?: Total Help needed to walk in hospital room?: Total Help needed climbing 3-5 steps with a railing? : Total 6 Click Score: 8    End of Session Equipment Utilized During Treatment: Oxygen Activity Tolerance: Patient limited by pain Patient left: in bed;with call bell/phone within reach;with bed alarm set Nurse Communication: Mobility status PT Visit Diagnosis: Other abnormalities of gait and mobility (R26.89);Pain;History of falling (Z91.81) Pain - Right/Left: Left Pain - part of body: Hip;Leg    Time: 1119-1140 PT Time Calculation (min) (ACUTE ONLY): 21 min   Charges:     PT Treatments $Therapeutic Activity: 8-22 mins  Angline Schweigert, PT, GCS 09/23/22,1:25 PM

## 2022-09-23 NOTE — Assessment & Plan Note (Signed)
Daughter reports gradual decline over last 5 years and contemplating about hospice once she is given trial for rehab at SNF if she does not do well

## 2022-09-23 NOTE — TOC Progression Note (Signed)
Transition of Care [facility]) - Progression Note    Patient Details  Name: Heidi Whitaker MRN: [id number] Date of Birth: Feb 06, 1929  Transition of Care [facility]) CM/SW [place], RN Phone Number: 09/23/2022, 12:58 PM  Clinical Narrative:    The family is wanting to stay within the [place] area, they have a special needs son that needs their care as well, [facility] is the only local SNF making an offer    I called the daughter [name], we reviewed needs and that to have home health it would mean they only come out 2-3 times a week for approx an hour  She stated that she would accept the bed from [facility] pending        Expected Discharge Plan and Services                                                 Social Determinants of Health (SDOH) Interventions    Readmission Risk Interventions     No data to display

## 2022-09-23 NOTE — Assessment & Plan Note (Signed)
Continue home medicines at discharge

## 2022-09-23 NOTE — Assessment & Plan Note (Signed)
Followed by palliative care.  Daughter is considering hospice after discharge from SNF

## 2022-09-23 NOTE — Plan of Care (Signed)

## 2022-09-23 NOTE — Consult Note (Signed)
Consultation Note Date: 09/23/2022   Patient Name: Heidi Whitaker  DOB: 1929-02-18  MRN: [id number]  Age / Sex: 88 y.o., female  PCP: McLean-Scocuzza, Nino Glow, MD Referring Physician: Max Sane, MD  Reason for Consultation: Establishing goals of care  HPI/Patient Profile: 88 y.o. female  with past medical history of non-insulin-dependent diabetes mellitus, hypertension, hyperlipidemia, persistent atrial fibrillation on Eliquis and digoxin, history of stroke, history of DVT, chronic diarrhea, and status post colostomy secondary to benign tumors admitted on 09/18/2022 with unwitnessed fall.  Found to have severely displaced and comminuted proximal left femoral shaft fracture.  Patient had surgery 9/25.  Postop patient had confusion and agitation with poor p.o. intake.  PMT consulted to discuss goals of care.  Clinical Assessment and Goals of Care: I have reviewed medical records including EPIC notes, labs and imaging,  assessed the patient and then met with patient and her daughter and son-in-law to discuss diagnosis prognosis, GOC, EOL wishes, disposition and options.  I introduced Palliative Medicine as specialized medical care for people living with serious illness. It focuses on providing relief from the symptoms and stress of a serious illness. The goal is to improve quality of life for both the patient and the family.   We discussed patient's current illness and what it means in the larger context of patient's on-going co-morbidities.  Natural disease trajectory and expectations at EOL were discussed.  Discussed slow recovery postop however now she has started eating better and is more alert.  Daughter hopeful for ongoing improvement.  I attempted to elicit values and goals of care important to the patient.  Patient would like to go home but knows she likely needs more support at this time than can be provided in the home.  The difference  between aggressive medical intervention and comfort care was considered in light of the patient's goals of care.   Discussed with daughter the importance of continued conversation with family and the medical providers regarding overall plan of care and treatment options, ensuring decisions are within the context of the patients values and GOCs.    Hospice and Palliative Care services outpatient were explained and offered.  We discussed different options of home with home health and palliative versus home with hospice versus going to rehab facility.  After each option discussed in detail daughter feels that going to rehab facility is likely best option as she does not feel they are able to provide amount of support patient will need in the home at this time.  They are hopeful for recovery and functional status at rehab and then she can return home.  We discussed that patient does not do well at rehab hospice can always be reconsidered at that time.  Daughter is hopeful for rehab close to her home in [place].  Discussed this with TOC.  We discussed her pain.  Patient tells me she is not in pain at this moment.  Daughter tells me therapy has been difficult related to pain.  We reviewed her pain regimen and discussed taking pain medicine prior to therapy.  Questions and concerns were addressed. The family was encouraged to call with questions or concerns.  Primary Decision Maker HCPOA -daughter [name]    SUMMARY OF RECOMMENDATIONS   -Family seems to have settled on discharge to rehab as long as patient continues to improve the way she has so far -PMT will follow along, will attempt to complete MOST form prior to discharge  Code Status/Advance Care Planning: DNR  Symptom Management:  Continue on current regimen of as needed oxycodone  Discharge Planning: [facility] for rehab with Palliative care service follow-up      Primary Diagnoses: Present on Admission:  Left femoral  shaft fracture ([place])  Vitamin D deficiency  Valvular heart disease  Type 2 diabetes mellitus with stage 3 chronic kidney disease, without long-term current use of insulin (HCC)  Stage 3a chronic kidney disease (HCC)  PAD (peripheral artery disease) (HCC)  HTN (hypertension)  Hyperlipidemia  Hypertension associated with diabetes ([place])  Acute deep vein thrombosis (DVT) of distal vein of left lower extremity (HCC)  Leukocytosis  Persistent atrial fibrillation (HCC)  Acute on chronic systolic CHF (congestive heart failure) ([place])   I have reviewed the medical record, interviewed the patient and family, and examined the patient. The following aspects are pertinent.  Past Medical History:  Diagnosis Date   Arrhythmia    atrial fibrillation   CHF (congestive heart failure) (HCC)    Complication of anesthesia    Diabetes mellitus without complication (HCC)    DVT (deep venous thrombosis) ([place])    06/02/2019   Hard of hearing    Hypertension    PAD (peripheral artery disease) ([place])    Social History   Socioeconomic History   Marital status: Widowed    Spouse name: Not on file   Number of children: Not on file   Years of education: Not on file   Highest education level: Not on file  Occupational History   Not on file  Tobacco Use   Smoking status: Former    Years: 4.00    Types: Cigarettes   Smokeless tobacco: Never   Tobacco comments:    3 per day  Vaping Use   Vaping Use: Never used  Substance and Sexual Activity   Alcohol use: No   Drug use: No   Sexual activity: Not Currently  Other Topics Concern   Not on file  Social History Narrative   Retired Pharmacist, hospital    2 daughters lives with [name]    1 son deceased    Widowed    Moved from [place] Strain: [place]  (11/26/2021)   Overall Financial Resource Strain (CARDIA)    Difficulty of Paying Living Expenses: Not hard at all  Food Insecurity: No [place]  (11/26/2021)   Hunger Vital Sign    Worried About Running Out of Food in the Last Year: Never true    [place] in the Last Year: Never true  Transportation Needs: No Transportation Needs (11/26/2021)   PRAPARE - Hydrologist (Medical): No    Lack of Transportation (Non-Medical): No  Physical Activity: Insufficiently Active (11/26/2021)   Exercise Vital Sign    Days of Exercise per Week: 3 days    Minutes of Exercise per Session: 20 min  Stress: No Stress Concern Present (11/26/2021)   [place]    Feeling of Stress : Not at all  Social Connections: Unknown (11/26/2021)   Social Connection and Isolation Panel [NHANES]    Frequency of Communication with Friends and Family: More than three times a week    Frequency of Social Gatherings with Friends and Family: More than three times a week    Attends Religious Services: Not on file    Active Member of Clubs or Organizations: Not on file    Attends  Music therapist: Not on file    Marital Status: Not on file   Family History  Problem Relation Age of Onset   Addison's disease Maternal Aunt    Heart disease Mother    Heart disease Father    Scheduled Meds:  apixaban  5 mg Oral BID   cholecalciferol  2,000 Units Oral Daily   digoxin  0.0625 mg Oral Daily   docusate sodium  100 mg Oral BID   feeding supplement  237 mL Oral BID BM   furosemide  40 mg Intravenous Once   metoprolol succinate  50 mg Oral Daily   And   metoprolol succinate  25 mg Oral QPM   multivitamin with minerals  1 tablet Oral Daily   pravastatin  20 mg Oral q1800   QUEtiapine  25 mg Oral QHS   traMADol  50 mg Oral Q6H   Continuous Infusions:  methocarbamol (ROBAXIN) IV     PRN Meds:.acetaminophen, alum & mag hydroxide-simeth, bisacodyl, labetalol, lactulose, LORazepam, menthol-cetylpyridinium **OR** phenol, methocarbamol **OR** methocarbamol  (ROBAXIN) IV, morphine injection, ondansetron **OR** ondansetron (ZOFRAN) IV, oxyCODONE, polyethylene glycol, senna-docusate No Known Allergies Review of Systems  Constitutional:  Positive for activity change and fatigue.  Neurological:  Positive for weakness.    Physical Exam Constitutional:      General: She is not in acute distress.    Appearance: She is ill-appearing.  Pulmonary:     Effort: Pulmonary effort is normal.  Skin:    General: Skin is warm and dry.  Neurological:     Mental Status: She is alert.     Vital Signs: BP 100/70 (BP Location: Right Arm)   Pulse 65   Temp (!) 97.4 F (36.3 C)   Resp 20   Ht 5' 4"  (1.626 m)   Wt 80.1 kg   SpO2 94%   BMI 30.31 kg/m  Pain Scale: Faces POSS *See Group Information*: 1-Acceptable,Awake and alert Pain Score: 5    SpO2: SpO2: 94 % O2 Device:SpO2: 94 % O2 Flow Rate: .O2 Flow Rate (L/min): 2 L/min  IO: Intake/output summary:  Intake/Output Summary (Last 24 hours) at 09/23/2022 1142 Last data filed at 09/23/2022 1021 Gross per 24 hour  Intake 1559.81 ml  Output 1000 ml  Net 559.81 ml    LBM: Last BM Date : 09/23/22 Baseline Weight: Weight: 80.1 kg Most recent weight: Weight: 80.1 kg     Palliative Assessment/Data: PPS 50%     *Please note that this is a verbal dictation therefore any spelling or grammatical errors are due to the "[place]" system interpretation.   Juel Burrow, DNP, AGNP-C Palliative Medicine Team [phone] Pager: [phone]

## 2022-09-23 NOTE — Progress Notes (Addendum)
Occupational Therapy Treatment Patient Details Name: Heidi Whitaker MRN: [id number] DOB: 1929/02/13 Today's Date: 09/23/2022   History of present illness Pt. is a 88 y.o. female with past medical history of hypertension, hyperlipidemia, diabetes, atrial fibrillation on Eliquis, DVT, CKD, CHF, and stroke who presents to the ED following fall.  Patient reports that she was trying to put on her sweater when she states that she turned around too fast, causing her to lose her balance.  She does not think she hit her head and denies any loss of consciousness.  She does report hitting her left hip with significant pain just below the hip since then.  She has been unable to bear weight on her left leg, denies any pain in her right leg or her upper extremities.  She is s/p L IM nailing for hip, TTWB on LLE.   OT comments  Pt. requires MaxA LE ADLs. SpO2 is 92% on 2 LO2 with HR 76 bpms. Pt. education was provided about general basic reacher use for the retrieval of items, and management of bed linens. Pt. education was provided about PLB techniques with verbal cues, cognitive cues, and cues for visual demonstration. Pt. Performed BUE biceps, and triceps strengthening  exercises with yellow theraband for 1 set 10 reps each. There. Ex was performed to improve UE strength needed in preparation for maintaining TTWBing with functional mobility during ADLs, and IADL tasks. Pt. Tolerated the UE exercises without difficulty. Pt. Continues to benefit from OT services for ADL training, A/E training, UE ther. Ex., and pt. education/caregiver education. Pt. Continues to be appropriate for SNF level of care with follow-up OT services upon discharge.   Recommendations for follow up therapy are one component of a multi-disciplinary discharge planning process, led by the attending physician.  Recommendations may be updated based on patient status, additional functional criteria and insurance authorization.    Follow Up  Recommendations  Skilled nursing-short term rehab (<3 hours/day)    Assistance Recommended at Discharge Frequent or constant Supervision/Assistance  Patient can return home with the following  Two people to help with walking and/or transfers;A lot of help with bathing/dressing/bathroom;Assistance with cooking/housework;Assist for transportation;Help with stairs or ramp for entrance;Direct supervision/assist for medications management;Direct supervision/assist for financial management   Equipment Recommendations       Recommendations for Other Services      Precautions / Restrictions Precautions Precautions: Fall Restrictions Weight Bearing Restrictions: Yes LLE Weight Bearing: Touchdown weight bearing Other Position/Activity Restrictions: TDWBing LLE       Mobility Bed Mobility Overal bed mobility: Needs Assistance             General bed mobility comments: Pt. seen seated upright at [place].    Transfers                         Balance                                           ADL either performed or assessed with clinical judgement   ADL                                         General ADL Comments: MinA UB, and MaxA LB ADLs.    Extremity/Trunk Assessment Upper Extremity  Assessment Upper Extremity Assessment: Generalized weakness            Vision      Perception     Praxis      Cognition Arousal/Alertness: Awake/alert Behavior During Therapy: WFL for tasks assessed/performed, Flat affect Overall Cognitive Status: History of cognitive impairments - at baseline                                 General Comments: Confusion        Exercises      Shoulder Instructions       General Comments      Pertinent Vitals/ Pain       Pain Assessment Pain Assessment: Faces Faces Pain Scale: Hurts a little bit Pain Location: Left Hip Pain Descriptors / Indicators: Operative site  guarding Pain Intervention(s): Monitored during session, Repositioned, Premedicated before session  Home Living Family/patient expects to be discharged to:: Private residence Living Arrangements: Children                                      Prior Functioning/Environment              Frequency  Min 2X/week        Progress Toward Goals  OT Goals(current goals can now be found in the care plan section)  Progress towards OT goals: Progressing toward goals  Acute Rehab OT Goals Patient Stated Goal: To return home OT Goal Formulation: With patient Time For Goal Achievement: 10/06/22 Potential to Achieve Goals: Good  Plan      Co-evaluation                 AM-PAC OT "6 Clicks" Daily Activity     Outcome Measure   Help from another person eating meals?: A Little Help from another person taking care of personal grooming?: A Little Help from another person toileting, which includes using toliet, bedpan, or urinal?: A Lot Help from another person bathing (including washing, rinsing, drying)?: A Lot Help from another person to put on and taking off regular upper body clothing?: A Little Help from another person to put on and taking off regular lower body clothing?: A Lot 6 Click Score: 15    End of Session Equipment Utilized During Treatment: Gait belt;Rolling walker (2 wheels);Oxygen  OT Visit Diagnosis: Other abnormalities of gait and mobility (R26.89);Muscle weakness (generalized) (M62.81);Pain;History of falling (Z91.81) Pain - Right/Left: Left Pain - part of body: Hip;Leg   Activity Tolerance Patient limited by pain   Patient Left in bed;with call bell/phone within reach;with bed alarm set;with nursing/sitter in room   Nurse Communication Patient requests pain meds;Mobility status        Time: 5809-9833 OT Time Calculation (min): 25 min  Charges: OT General Charges $OT Visit: 1 Visit OT Treatments $Self Care/Home Management : 23-37  mins  Harrel Carina, MS, OTR/L   Harrel Carina 09/23/2022, 2:42 PM

## 2022-09-23 NOTE — Progress Notes (Signed)
Progress Note   Patient: Heidi Whitaker:[id number] DOB: January 30, 1929 DOA: 09/18/2022     5 DOS: the patient was seen and examined on 09/23/2022   Brief hospital course: [name] is a 88 year old female with history of non-insulin-dependent diabetes mellitus, hypertension, hyperlipidemia, persistent atrial fibrillation on Eliquis and digoxin, history of stroke, history of DVT, chronic diarrhea, status post colostomy secondary to benign tumors, who presents emergency department for chief concerns of unwitnessed mechanical fall. Left hip with and without pelvis x-ray was read as: Severely displaced and comminuted proximal left femoral shaft fracture. Surgery on 9/25. Postoperatively, patient developed some volume overload, increased confusion and agitation. Discharged to nursing home when mental status improves.  May consider hospice if patient does not improve with mental status or does not eat.  9/27: TOC working with family for placement to SNF likely tomorrow   Assessment and Plan: * Left femoral shaft fracture ([place]) - Secondary to mechanical fall - Fall precaution - Status post intramedullary fixation of the left femur fracture POD #2, followed by orthopedics.  Continue pain meds and DVT prophylaxis per Ortho. SNF at discharge.  TOC working with family and insurance.  Likely white [facility] tomorrow  Dementia with behavioral disturbance (Heidi Whitaker) Followed by palliative care.  Daughter is considering hospice after discharge from SNF  Obesity (BMI 30-39.9) Complicates overall prognosis.  Per patient's daughter she has lost about 15 to 20 pounds over last 5 years, gradual weight loss  Failure to thrive in adult Daughter reports gradual decline over last 5 years and contemplating about hospice once she is given trial for rehab at SNF if she does not do well  Vascular dementia (Heidi Whitaker) Will complicate overall prognosis especially her behavioral and participation with  therapy  Persistent atrial fibrillation (Heidi Whitaker) - Continue metoprolol and digoxin for rate control.  Eliquis for anticoagulation  Colostomy in place [facility]) - Wound/colostomy consultation placed  Stage 3a chronic kidney disease (Heidi Whitaker) - At baseline.  Acute deep vein thrombosis (DVT) of distal vein of left lower extremity (Heidi Whitaker) - History of left DVT in June 2020 - Patient is on Eliquis 5 mg p.o. twice daily  Leukocytosis - Etiology work-up in progress, differentials include reactive secondary to left femoral fracture versus pneumonia - Check procalcitonin-stat portable chest x-ray ordered  Hyperlipidemia - Pravastatin 20 mg daily resumed  Type 2 diabetes mellitus with stage 3 chronic kidney disease, without long-term current use of insulin (HCC) Continue home medicines at discharge  HTN (hypertension) - Metoprolol succinate 25 mg tablet, patient taking 50 mg in the morning and 25 mg in the p.m., this has been resumed per outpatient therapy - Furosemide 20-40 mg tablet every other day - Labetalol 5 mg IV every 3 hours as needed for SBP greater than 180, 4 doses ordered        Subjective: No new complaints.  Minimal pain at the surgical site  Physical Exam: Vitals:   09/23/22 0434 09/23/22 0841 09/23/22 1142 09/23/22 1158  BP: 119/67 100/70  104/69  Pulse: 99 65  85  Resp: '20 20  17  '$ Temp: 98 F (36.7 C) (!) 97.4 F (36.3 C)  97.8 F (36.6 C)  TempSrc:      SpO2: 92% 94% 91% 98%  Weight:      Height:       88 year old female lying in the bed comfortably without any acute distress Lungs clear to auscultation bilaterally Cardiovascular irregular irregular heart rhythm Abdomen soft, benign Incision scant drainage Neuro alert  and awake, nonfocal  Data Reviewed:  Creatinine 1.03, WBC 14.3, hemoglobin 8.5  Family Communication: Daughter and son-in-law were updated at bedside  Disposition: Status is: Inpatient Remains inpatient appropriate because: Waiting for SNF  placement   Planned Discharge Destination: Skilled nursing facility    DVT prophylaxis-Eliquis Time spent: 35 minutes  Author: Max Sane, MD 09/23/2022 5:24 PM  For on call review [email or web address].

## 2022-09-23 NOTE — Progress Notes (Signed)
Pt is agitated and refused medication, wants to call daughter to tell them to come get her out of the hospital, called daughter and patient was able to speak with daughter over the phone, still refused to take pills for tonight. Reoriented and reassured patient that she is safe and that she is at the hospital.

## 2022-09-23 NOTE — Progress Notes (Signed)
Subjective:  POD #2 s/p intramedullary fixation for left femur fracture.   Patient reports left thigh pain as moderate.  Patient is apprehensive about physical therapy given her left thigh pain yesterday with left lower extremity movement.  Objective:   VITALS:   Vitals:   09/23/22 0434 09/23/22 0841 09/23/22 1142 09/23/22 1158  BP: 119/67 100/70  104/69  Pulse: 99 65  85  Resp: '20 20  17  '$ Temp: 98 F (36.7 C) (!) 97.4 F (36.3 C)  97.8 F (36.6 C)  TempSrc:      SpO2: 92% 94% 91% 98%  Weight:      Height:        PHYSICAL EXAM: Left lower extremity Neurovascular intact Sensation intact distally Intact pulses distally Dorsiflexion/Plantar flexion intact Incision: scant drainage No cellulitis present Compartment soft  LABS  Results for orders placed or performed during the hospital encounter of 09/18/22 (from the past 24 hour(s))  CBC     Status: Abnormal   Collection Time: 09/23/22  5:12 AM  Result Value Ref Range   WBC 16.1 (H) 4.0 - 10.5 K/uL   RBC 2.37 (L) 3.87 - 5.11 MIL/uL   Hemoglobin 7.5 (L) 12.0 - 15.0 g/dL   HCT 22.4 (L) 36.0 - 46.0 %   MCV 94.5 80.0 - 100.0 fL   MCH 31.6 26.0 - 34.0 pg   MCHC 33.5 30.0 - 36.0 g/dL   RDW 13.8 11.5 - 15.5 %   Platelets 178 150 - 400 K/uL   nRBC 0.4 (H) 0.0 - 0.2 %  Basic metabolic panel     Status: Abnormal   Collection Time: 09/23/22  5:12 AM  Result Value Ref Range   Sodium 137 135 - 145 mmol/L   Potassium 3.9 3.5 - 5.1 mmol/L   Chloride 103 98 - 111 mmol/L   CO2 27 22 - 32 mmol/L   Glucose, Bld 199 (H) 70 - 99 mg/dL   BUN 29 (H) 8 - 23 mg/dL   Creatinine, Ser 0.96 0.44 - 1.00 mg/dL   Calcium 8.2 (L) 8.9 - 10.3 mg/dL   GFR, Estimated 56 (L) >60 mL/min   Anion gap 7 5 - 15  Magnesium     Status: None   Collection Time: 09/23/22  5:12 AM  Result Value Ref Range   Magnesium 2.2 1.7 - 2.4 mg/dL    DG FEMUR MIN 2 VIEWS LEFT  Result Date: 09/21/2022 CLINICAL DATA:  Femur fracture, status post intramedullary  nail EXAM: LEFT FEMUR 2 VIEWS COMPARISON:  09/18/2022 FINDINGS: Status post intramedullary nail fixation of comminuted fractures of the proximal left femoral metadiaphysis, with near anatomic fracture alignment. Expected overlying postoperative change. IMPRESSION: Status post intramedullary nail fixation of comminuted fractures of the proximal left femoral metadiaphysis, with near anatomic fracture alignment. Electronically Signed   By: Delanna Ahmadi M.D.   On: 09/21/2022 16:20   DG HIP UNILAT WITH PELVIS 2-3 VIEWS LEFT  Result Date: 09/21/2022 CLINICAL DATA:  Left IM nail EXAM: DG HIP (WITH OR WITHOUT PELVIS) 2-3V LEFT COMPARISON:  Hip radiograph 09/18/2022 FINDINGS: Intraoperative images during left femur intramedullary nailing. Improved fracture alignment without evidence of immediate complication. Vascular stent noted. IMPRESSION: Intraoperative images during left femur intramedullary nailing. Improved, near anatomic fracture alignment. No evidence of immediate hardware complication. Electronically Signed   By: Maurine Simmering M.D.   On: 09/21/2022 15:39   DG C-Arm 1-60 Min-No Report  Result Date: 09/21/2022 Fluoroscopy was utilized by the requesting physician.  No radiographic  interpretation.    Assessment/Plan: 2 Days Post-Op   Principal Problem:   Left femoral shaft fracture (HCC) Active Problems:   HTN (hypertension)   Type 2 diabetes mellitus with stage 3 chronic kidney disease, without long-term current use of insulin (HCC)   Hyperlipidemia   Vitamin D deficiency   Leukocytosis   Acute deep vein thrombosis (DVT) of distal vein of left lower extremity (HCC)   Stage 3a chronic kidney disease (HCC)   Hypertension associated with diabetes ([place])   PAD (peripheral artery disease) (HCC)   Valvular heart disease   Acute on chronic systolic CHF (congestive heart failure) (HCC)   Chronic systolic CHF (congestive heart failure) (HCC)   Colostomy in place [facility])   Persistent atrial fibrillation  ([place])   History of stroke   Vascular dementia ([place])   Failure to thrive in adult   Obesity (BMI 30-39.9)   Dementia with behavioral disturbance [facility])  Patient is alert and conversive today.  Continue physical and occupational therapy as tolerated.  Patient is toe-touch weightbearing on the left lower extremity given the combination of her femur fracture.  She will require skilled nursing facility upon discharge.  She has restarted her baseline Eliquis for atrial fibrillation which will cover her for DVT prophylaxis as well.    [place] , MD 09/23/2022, 2:17 PM

## 2022-09-23 NOTE — TOC Progression Note (Signed)
Transition of Care [facility]) - Progression Note    Patient Details  Name: Heidi Whitaker MRN: [id number] Date of Birth: [birth year]  Transition of Care [facility]) CM/SW [place], RN Phone Number: 09/23/2022, 1:59 PM  Clinical Narrative:    Insurance approved 9/28 - 10/4.  Cert: [id number].  Expected Discharge Plan: Skilled Nursing Facility Barriers to Discharge: Insurance Authorization  Expected Discharge Plan and Services Expected Discharge Plan: [place]                                               Social Determinants of Health (SDOH) Interventions Food Insecurity Interventions: Intervention Not Indicated Housing Interventions: Intervention Not Indicated Utilities Interventions: Intervention Not Indicated  Readmission Risk Interventions     No data to display

## 2022-09-23 NOTE — Assessment & Plan Note (Signed)
Complicates overall prognosis.  Per patient's daughter she has lost about 15 to 20 pounds over last 5 years, gradual weight loss

## 2022-09-24 DIAGNOSIS — Z7189 Other specified counseling: Secondary | ICD-10-CM | POA: Diagnosis not present

## 2022-09-24 DIAGNOSIS — F03918 Unspecified dementia, unspecified severity, with other behavioral disturbance: Secondary | ICD-10-CM | POA: Diagnosis not present

## 2022-09-24 DIAGNOSIS — I824Z2 Acute embolism and thrombosis of unspecified deep veins of left distal lower extremity: Secondary | ICD-10-CM

## 2022-09-24 DIAGNOSIS — I5022 Chronic systolic (congestive) heart failure: Secondary | ICD-10-CM | POA: Diagnosis not present

## 2022-09-24 DIAGNOSIS — R627 Adult failure to thrive: Secondary | ICD-10-CM | POA: Diagnosis not present

## 2022-09-24 DIAGNOSIS — S72352A Displaced comminuted fracture of shaft of left femur, initial encounter for closed fracture: Secondary | ICD-10-CM | POA: Diagnosis not present

## 2022-09-24 DIAGNOSIS — I5023 Acute on chronic systolic (congestive) heart failure: Secondary | ICD-10-CM | POA: Diagnosis not present

## 2022-09-24 LAB — CBC
HCT: 25.9 % — ABNORMAL LOW (ref 36.0–46.0)
Hemoglobin: 8.3 g/dL — ABNORMAL LOW (ref 12.0–15.0)
MCH: 31 pg (ref 26.0–34.0)
MCHC: 32 g/dL (ref 30.0–36.0)
MCV: 96.6 fL (ref 80.0–100.0)
Platelets: 244 10*3/uL (ref 150–400)
RBC: 2.68 MIL/uL — ABNORMAL LOW (ref 3.87–5.11)
RDW: 14.5 % (ref 11.5–15.5)
WBC: 18.2 10*3/uL — ABNORMAL HIGH (ref 4.0–10.5)
nRBC: 1.9 % — ABNORMAL HIGH (ref 0.0–0.2)

## 2022-09-24 NOTE — Progress Notes (Signed)
Nutrition Brief Note  Chart reviewed. Pt now transitioning to comfort care.  No further nutrition interventions planned at this time.  Please re-consult as needed.   Rhesa Forsberg W, RD, LDN, CDCES Registered Dietitian II Certified Diabetes Care and Education Specialist Please refer to AMION for RD and/or RD on-call/weekend/after hours pager   

## 2022-09-24 NOTE — Assessment & Plan Note (Signed)
Comfort care  °

## 2022-09-24 NOTE — Progress Notes (Signed)
[facility]) Hospital Liaison Note  Received request from Max Sane, MD for family interest in [place].  Spoke with patients daughter, Jackelyn Poling and confirmed interest and explain services.  Eligibility confirmed.  Family is agreeable and awaiting bed availability.    ACC will update hospital staff and family once a bed is available.    Kenna Gilbert BSN, RN  [place] Liaison  [phone]

## 2022-09-24 NOTE — Assessment & Plan Note (Signed)
Planning for hospice at discharge

## 2022-09-24 NOTE — Plan of Care (Signed)

## 2022-09-24 NOTE — Progress Notes (Signed)
PT Cancellation Note  Patient Details Name: Heidi Whitaker MRN: [id number] DOB: Oct 09, 1929   Cancelled Treatment:    Reason Eval/Treat Not Completed: Other (comment) (Awaiting hospice screening) Patient/family pursuing hospice/comfort care. Awaiting hospice screen. Will hold for now, follow up tomorrow to determine plan.     Steffan Caniglia 09/24/2022, 2:55 PM

## 2022-09-24 NOTE — Anesthesia Postprocedure Evaluation (Signed)
Anesthesia Post Note  Patient: Heidi Whitaker  Procedure(s) Performed: INTRAMEDULLARY (IM) NAIL FEMORAL (Left: Hip)  Patient location during evaluation: PACU Anesthesia Type: General Level of consciousness: awake and alert Pain management: pain level controlled Vital Signs Assessment: post-procedure vital signs reviewed and stable Respiratory status: spontaneous breathing, nonlabored ventilation, respiratory function stable and patient connected to nasal cannula oxygen Cardiovascular status: blood pressure returned to baseline and stable Postop Assessment: no apparent nausea or vomiting Anesthetic complications: no   No notable events documented.   Last Vitals:  Vitals:   09/24/22 0809 09/24/22 0819  BP:    Pulse: 71   Resp:    Temp:    SpO2: 95% 95%    Last Pain:  Vitals:   09/24/22 0008  TempSrc:   PainSc: 0-No pain                 Molli Barrows

## 2022-09-24 NOTE — Progress Notes (Signed)
OT Cancellation Note  Patient Details Name: Heidi Whitaker MRN: [id number] DOB: October 15, 1929   Cancelled Treatment:    Reason Eval/Treat Not Completed: Other (comment). Patient/family pursuing hospice/comfort care. Awaiting hospice screen. Will hold OT this date and follow up tomorrow to determine plan.   Ardeth Perfect., MPH, MS, OTR/L ascom [phone] 09/24/22, 2:57 PM

## 2022-09-24 NOTE — Assessment & Plan Note (Signed)
-   Comfort care and hospice

## 2022-09-24 NOTE — Assessment & Plan Note (Signed)
-   Secondary to mechanical fall - Fall precaution - Status post intramedullary fixation of the left femur fracture POD #3, followed by orthopedics.  Continue pain meds and DVT prophylaxis per Ortho. Planning for hospice.  Waiting for hospice home decision

## 2022-09-24 NOTE — Assessment & Plan Note (Signed)
Comfort care and hospice plan

## 2022-09-24 NOTE — Assessment & Plan Note (Signed)
Hospice planned

## 2022-09-24 NOTE — Assessment & Plan Note (Signed)
Family pursuing hospice for her

## 2022-09-24 NOTE — Progress Notes (Signed)
  Subjective:  POD #3 s/p intramedullary fixation for left femur fracture.   Patient reports left thigh pain as mild rest.  Patient wearing mitts.  She was sleeping but arousable this evening during my examination.  Patient is being screened for hospice home.  Objective:   VITALS:   Vitals:   09/24/22 0803 09/24/22 0809 09/24/22 0819 09/24/22 1644  BP: 105/70   116/79  Pulse: 96 71  99  Resp:      Temp: 97.8 F (36.6 C)   97.9 F (36.6 C)  TempSrc:      SpO2: (!) 83% 95% 95% 97%  Weight:      Height:        PHYSICAL EXAM: Left lower extremity Neurovascular intact Sensation intact distally Intact pulses distally Dorsiflexion/Plantar flexion intact Incision: dressing C/D/I No cellulitis present Compartment soft  LABS  Results for orders placed or performed during the hospital encounter of 09/18/22 (from the past 24 hour(s))  CBC     Status: Abnormal   Collection Time: 09/24/22  9:28 AM  Result Value Ref Range   WBC 18.2 (H) 4.0 - 10.5 K/uL   RBC 2.68 (L) 3.87 - 5.11 MIL/uL   Hemoglobin 8.3 (L) 12.0 - 15.0 g/dL   HCT 25.9 (L) 36.0 - 46.0 %   MCV 96.6 80.0 - 100.0 fL   MCH 31.0 26.0 - 34.0 pg   MCHC 32.0 30.0 - 36.0 g/dL   RDW 14.5 11.5 - 15.5 %   Platelets 244 150 - 400 K/uL   nRBC 1.9 (H) 0.0 - 0.2 %    No results found.  Assessment/Plan: 3 Days Post-Op   Principal Problem:   Left femoral shaft fracture (HCC) Active Problems:   HTN (hypertension)   Type 2 diabetes mellitus with stage 3 chronic kidney disease, without long-term current use of insulin (HCC)   Hyperlipidemia   Vitamin D deficiency   Leukocytosis   Acute deep vein thrombosis (DVT) of distal vein of left lower extremity (HCC)   Stage 3a chronic kidney disease (HCC)   Hypertension associated with diabetes ([place])   PAD (peripheral artery disease) (HCC)   Valvular heart disease   Acute on chronic systolic CHF (congestive heart failure) (HCC)   Chronic systolic CHF (congestive heart failure)  (HCC)   Colostomy in place [facility])   Persistent atrial fibrillation ([place])   History of stroke   Vascular dementia ([place])   Failure to thrive in adult   Obesity (BMI 30-39.9)   Dementia with behavioral disturbance [facility])  Patient with ported increased confusion and agitation.  She is wearing mitts.  Patient's family is interested on hospice home.  Screening is in process.  Continue Eliquis for DVT prophylaxis in addition to baseline atrial fibrillation.  Continue physical and occupational therapy as the patient can tolerate.  Patient may follow-up in the orthopedic clinic in 10 to 14 days after discharge on the wishes of the family.  Staples may be removed 2 weeks postop in hospice home if she is not returning to the office.  I would recommend 4 to 6 weeks of toe-touch weightbearing on the left lower extremity given the combination of the patient's fracture.  A transfer from bed to a chair or wheelchair for left leg elevated.    Thornton Park , MD 09/24/2022, 5:43 PM

## 2022-09-24 NOTE — Assessment & Plan Note (Signed)
Hospice

## 2022-09-24 NOTE — TOC Progression Note (Signed)
Transition of Care [facility]) - Progression Note    Patient Details  Name: Heidi Whitaker MRN: [id number] Date of Birth: [birth year]  Transition of Care [facility]) CM/SW [place], RN Phone Number: 09/24/2022, 11:54 AM  Clinical Narrative:     Cheryl Flash to [name] the daughter I explained that she can go to [facility] any time to do the paperwork She thought that they meant that they have to go right now The patient is very confused and not at baseline  I explained to the daughter that she may not be rehab appropriate and explained that she would need to be able top progress and meet goals to go to rehab, I explained Insurance will not cover long term, she stated understanding She wants to get a hospice consult She stated that she does not want extra care, she wants the patient moved over to be comfort care She has a family meeting with Dr Manuella Ghazi and will let him know as well She asked what Hospice will provide, I explained that I would let hospice explain what they will do so that I did not provide wrong information, she stated agreement  Expected Discharge Plan: Skilled Nursing Facility Barriers to Discharge: Insurance Authorization  Expected Discharge Plan and Services Expected Discharge Plan: [place]                                               Social Determinants of Health (SDOH) Interventions Food Insecurity Interventions: Intervention Not Indicated Housing Interventions: Intervention Not Indicated Utilities Interventions: Intervention Not Indicated  Readmission Risk Interventions     No data to display

## 2022-09-24 NOTE — Progress Notes (Signed)
Daily Progress Note   Patient Name: Heidi Whitaker       Date: 09/24/2022 DOB: [birth year]  Age: 88 y.o. MRN#: [id number] Attending Physician: Max Sane, MD Primary Care Physician: McLean-Scocuzza, Nino Glow, MD Admit Date: 09/18/2022  Reason for Consultation/Follow-up: Establishing goals of care  Subjective: Confused - thinks she is going home but easily redirectable Tells me pain is controlled - hurts when they move her  Length of Stay: 6  Current Medications: Scheduled Meds:   apixaban  5 mg Oral BID   cholecalciferol  2,000 Units Oral Daily   digoxin  0.0625 mg Oral Daily   docusate sodium  100 mg Oral BID   feeding supplement  237 mL Oral BID BM   furosemide  40 mg Intravenous Once   metoprolol succinate  50 mg Oral Daily   And   metoprolol succinate  25 mg Oral QPM   multivitamin with minerals  1 tablet Oral Daily   pravastatin  20 mg Oral q1800   QUEtiapine  25 mg Oral QHS   traMADol  50 mg Oral Q6H    Continuous Infusions:  methocarbamol (ROBAXIN) IV      PRN Meds: acetaminophen, alum & mag hydroxide-simeth, bisacodyl, labetalol, lactulose, LORazepam, menthol-cetylpyridinium **OR** phenol, methocarbamol **OR** methocarbamol (ROBAXIN) IV, morphine injection, ondansetron **OR** ondansetron (ZOFRAN) IV, oxyCODONE, polyethylene glycol, senna-docusate  Physical Exam Constitutional:      General: She is not in acute distress.    Appearance: She is ill-appearing.  Pulmonary:     Effort: Pulmonary effort is normal.  Skin:    General: Skin is warm and dry.            Vital Signs: BP 105/70 (BP Location: Right Arm)   Pulse 71   Temp 97.8 F (36.6 C)   Resp 16   Ht '5\' 4"'$  (1.626 m)   Wt 80.1 kg   SpO2 95%   BMI 30.31 kg/m  SpO2: SpO2: 95 % O2 Device: O2 Device: Nasal  Cannula O2 Flow Rate: O2 Flow Rate (L/min): 2 L/min  Intake/output summary:  Intake/Output Summary (Last 24 hours) at 09/24/2022 1038 Last data filed at 09/24/2022 0551 Gross per 24 hour  Intake 360 ml  Output 900 ml  Net -540 ml   LBM: Last BM Date : 09/23/22 Baseline Weight: Weight: 80.1 kg Most recent weight: Weight: 80.1 kg       Palliative Assessment/Data: PPS 40%      Patient Active Problem List   Diagnosis Date Noted   Dementia with behavioral disturbance ([place]) 09/22/2022   Obesity (BMI 30-39.9) 09/21/2022   History of stroke 09/20/2022   Vascular dementia ([place]) 09/20/2022   Failure to thrive in adult 09/20/2022   Left femoral shaft fracture ([place]) 09/18/2022   Colostomy in place [facility]) 09/18/2022   Persistent atrial fibrillation ([place]) 92/33/0076   Chronic systolic CHF (congestive heart failure) ([place]) 11/25/2021   At high risk for falls 11/25/2021   Acute on chronic systolic CHF (congestive heart failure) ([place]) 10/31/2021   Type II diabetes mellitus with renal manifestations ([place]) 10/31/2021   Stroke ([place]) 10/31/2021   Acute respiratory failure with hypoxia ([place]) 10/31/2021   PAD (peripheral artery disease) ([place]) 05/21/2021  Valvular heart disease 52/84/1324   Acute systolic congestive heart failure ([place]) 05/21/2021   Cerebral infarction involving left cerebellar artery (HCC) 05/21/2021   Dyspnea 05/15/2021   Generalized weakness 05/14/2021   Stage 3a chronic kidney disease ([place]) 11/29/2020   Hypertension associated with diabetes ([place]) 11/29/2020   Aortic atherosclerosis ([place]) 11/29/2020   Tubulovillous adenoma of rectum 03/05/2020   Vitamin D deficiency 06/09/2019   Atrial fibrillation with RVR ([place]) 06/09/2019   Leukocytosis 06/09/2019   Acute deep vein thrombosis (DVT) of distal vein of left lower extremity (HCC) 06/09/2019   Leg edema 06/09/2019   Diabetes mellitus type 2 in obese (HCC)    Rectal mass 05/17/2019   Insomnia 09/30/2018   Abnormal gait  09/30/2018   Bilateral impacted cerumen 09/30/2018   Chronic diarrhea 09/30/2018   Type 2 diabetes mellitus with stage 3 chronic kidney disease, without long-term current use of insulin ([place]) 09/30/2018   Hyperlipidemia 09/30/2018   Nosebleed 09/30/2018   Does use hearing aid 09/30/2018   Positive colorectal cancer screening using Cologuard test 08/12/2018   HTN (hypertension) 08/12/2018    Palliative Care Assessment & Plan   HPI: 88 y.o. female  with past medical history of non-insulin-dependent diabetes mellitus, hypertension, hyperlipidemia, persistent atrial fibrillation on Eliquis and digoxin, history of stroke, history of DVT, chronic diarrhea, and status post colostomy secondary to benign tumors admitted on 09/18/2022 with unwitnessed fall.  Found to have severely displaced and comminuted proximal left femoral shaft fracture.  Patient had surgery 9/25.  Postop patient had confusion and agitation with poor p.o. intake.  PMT consulted to discuss goals of care.  Assessment: Pain controlled with current medication regimen.  Patient complains of pain with movement.  Requested nurse provide pain medication prior to PT session.  Patient remains a bit confused.  Family not at bedside during my visit.  Called to daughter Jackelyn Poling and reviewed plan to go to rehab for trial.  They may consider hospice if rehab does not go well.  We discussed a MOST form and a left one at bedside for daughter to review.  Daughter tells me she will attempt to complete her prior to discharge.  She does tell me ultimately her goals are for her mother's comfort and she does not want to pursue any further aggressive medical interventions as she feels her mother has lived a good life and she does not want to prolong things in her current state of health.  Recommendations/Plan: Pursuing trial at rehab, will consider hospice if rehab does not go well MOST form at bedside, will attempt to complete prior to  discharge Daughter's ultimate goal is for comfort and "a peaceful ending"  Code Status: DNR  Care plan was discussed with daughter  Thank you for allowing the Palliative Medicine Team to assist in the care of this patient.   *Please note that this is a verbal dictation therefore any spelling or grammatical errors are due to the "[place] [facility]" system interpretation.  Juel Burrow, DNP, [facility] Palliative Medicine Team Team Phone # [phone]  Pager [phone]

## 2022-09-24 NOTE — Assessment & Plan Note (Signed)
Daughter reports gradual decline over last 5 years-comfort care and hospice

## 2022-09-24 NOTE — Progress Notes (Signed)
  Progress Note   Patient: Heidi Whitaker ZHG:[id number] DOB: 16-Oct-1929 DOA: 09/18/2022     6 DOS: the patient was seen and examined on 09/24/2022   Brief hospital course: Ms. Kalandra Masters is a 88 year old female with history of non-insulin-dependent diabetes mellitus, hypertension, hyperlipidemia, persistent atrial fibrillation on Eliquis and digoxin, history of stroke, history of DVT, chronic diarrhea, status post colostomy secondary to benign tumors, who presents emergency department for chief concerns of unwitnessed mechanical fall. Left hip with and without pelvis x-ray was read as: Severely displaced and comminuted proximal left femoral shaft fracture. Surgery on 9/25. Postoperatively, patient developed some volume overload, increased confusion and agitation. Discharged to nursing home when mental status improves.  May consider hospice if patient does not improve with mental status or does not eat.  9/27: TOC working with family for placement to SNF likely tomorrow 9/28: Family pursuing hospice now.  Waiting for hospice home screening   Assessment and Plan: * Left femoral shaft fracture ([place]) - Secondary to mechanical fall - Fall precaution - Status post intramedullary fixation of the left femur fracture POD #3, followed by orthopedics.  Continue pain meds and DVT prophylaxis per Ortho. Planning for hospice.  Waiting for hospice home decision  Dementia with behavioral disturbance ([place]) Comfort care  Obesity (BMI 30-39.9) Hospice planned  Failure to thrive in adult Daughter reports gradual decline over last 5 years-comfort care and hospice  Vascular dementia [facility]) Family pursuing hospice for her  Persistent atrial fibrillation [facility]) Planning for hospice at discharge  Colostomy in place [facility]) - Wound/colostomy consultation placed  Stage 3a chronic kidney disease ([place]) Hospice  Acute deep vein thrombosis (DVT) of distal vein of left lower extremity ([place]) -  History of left DVT in June 2020 - Patient is on Eliquis 5 mg p.o. twice daily  Leukocytosis - Comfort care and hospice  Hyperlipidemia - Pravastatin 20 mg daily resumed  Type 2 diabetes mellitus with stage 3 chronic kidney disease, without long-term current use of insulin (HCC) Comfort care and hospice plan  HTN (hypertension) - Metoprolol succinate 25 mg tablet, patient taking 50 mg in the morning and 25 mg in the p.m., this has been resumed per outpatient therapy - Furosemide 20-40 mg tablet every other day - Labetalol 5 mg IV every 3 hours as needed for SBP greater than 180, 4 doses ordered        Subjective: Getting agitated requiring mittens  Physical Exam: Vitals:   09/24/22 0550 09/24/22 0803 09/24/22 0809 09/24/22 0819  BP: (!) 100/52 105/70    Pulse: 91 96 71   Resp: 16     Temp: 97.8 F (36.6 C) 97.8 F (36.6 C)    TempSrc:      SpO2: 94% (!) 83% 95% 95%  Weight:      Height:       88 year old female lying in the bed comfortably without any acute distress Lungs clear to auscultation bilaterally Cardiovascular irregular irregular heart rhythm Abdomen soft, benign Incision scant drainage Neuro nonfocal Psych agitated Data Reviewed:  Hemoglobin 8.3, WBC 18.2  Family Communication: Daughter updated by hospice liaison  Disposition: Status is: Inpatient Remains inpatient appropriate because: Waiting for hospice home decision   Planned Discharge Destination: Hospice home    DVT prophylaxis-Eliquis Time spent: 35 minutes  Author: Max Sane, MD 09/24/2022 2:40 PM  For on call review [email or web address].

## 2022-09-25 DIAGNOSIS — I5023 Acute on chronic systolic (congestive) heart failure: Secondary | ICD-10-CM | POA: Diagnosis not present

## 2022-09-25 DIAGNOSIS — Z515 Encounter for palliative care: Secondary | ICD-10-CM | POA: Diagnosis not present

## 2022-09-25 DIAGNOSIS — R531 Weakness: Secondary | ICD-10-CM | POA: Diagnosis not present

## 2022-09-25 DIAGNOSIS — R5381 Other malaise: Secondary | ICD-10-CM | POA: Diagnosis not present

## 2022-09-25 DIAGNOSIS — Z743 Need for continuous supervision: Secondary | ICD-10-CM | POA: Diagnosis not present

## 2022-09-25 DIAGNOSIS — Z7401 Bed confinement status: Secondary | ICD-10-CM | POA: Diagnosis not present

## 2022-09-25 DIAGNOSIS — I824Z2 Acute embolism and thrombosis of unspecified deep veins of left distal lower extremity: Secondary | ICD-10-CM | POA: Diagnosis not present

## 2022-09-25 DIAGNOSIS — S72352A Displaced comminuted fracture of shaft of left femur, initial encounter for closed fracture: Secondary | ICD-10-CM | POA: Diagnosis not present

## 2022-09-25 MED ORDER — LORAZEPAM 0.5 MG PO TABS: 0.5000 mg | ORAL_TABLET | Freq: Three times a day (TID) | ORAL | 0 refills | Status: AC | PRN

## 2022-09-25 MED ORDER — MORPHINE SULFATE (CONCENTRATE) 10 MG /0.5 ML PO SOLN: 5.0000 mg | ORAL | 0 refills | Status: AC | PRN

## 2022-09-25 NOTE — Care Management Important Message (Signed)
Important Message  Patient Details  Name: CHAVON LUCARELLI MRN: [id number] Date of Birth: Oct 01, 1929   Medicare Important Message Given:  Other (see comment)  Disposition to discharge with hospice services.  Medicare IM withheld at this time out of respect for patient and family.   Dannette Barbara 09/25/2022, 8:58 AM

## 2022-09-25 NOTE — Progress Notes (Signed)
  Subjective:  POD #4 s/p medullary fixation of left comminuted femur fracture.   Patient reports left thigh pain as mild to moderate.  Patient is resting comfortably in bed.  Patient's family has elected for hospice home.  Patient will be discharged today.  Objective:   VITALS:   Vitals:   09/24/22 0819 09/24/22 1644 09/24/22 2357 09/25/22 0914  BP:  116/79 105/64 119/80  Pulse:  99 91 90  Resp:   17 16  Temp:  97.9 F (36.6 C) 98 F (36.7 C) 99 F (37.2 C)  TempSrc:      SpO2: 95% 97% 96% 100%  Weight:      Height:        PHYSICAL EXAM: Left lower extremity Neurovascular intact Sensation intact distally Intact pulses distally Dorsiflexion/Plantar flexion intact Incision: dressing C/D/I No cellulitis present Compartment soft  LABS  No results found for this or any previous visit (from the past 24 hour(s)).  No results found.  Assessment/Plan: 4 Days Post-Op   Principal Problem:   Left femoral shaft fracture (HCC) Active Problems:   HTN (hypertension)   Type 2 diabetes mellitus with stage 3 chronic kidney disease, without long-term current use of insulin (HCC)   Hyperlipidemia   Vitamin D deficiency   Leukocytosis   Acute deep vein thrombosis (DVT) of distal vein of left lower extremity (HCC)   Stage 3a chronic kidney disease (HCC)   Hypertension associated with diabetes ([place])   PAD (peripheral artery disease) (HCC)   Valvular heart disease   Acute on chronic systolic CHF (congestive heart failure) (HCC)   Chronic systolic CHF (congestive heart failure) (HCC)   Colostomy in place [facility])   Persistent atrial fibrillation ([place])   History of stroke   Vascular dementia ([place])   Failure to thrive in adult   Obesity (BMI 30-39.9)   Dementia with behavioral disturbance [facility])   Hospice care patient  Patient will be going to hospice home.  She may continue physical and occupational therapy as tolerated.  Patient is toe-touch weightbearing for 4 to 6 weeks postop  given the comminution of her left femur.  She may follow-up in my office in 10 to 14 days.  She is on Eliquis which should cover her for DVT prophylaxis.    Thornton Park , MD 09/25/2022, 1:06 PM

## 2022-09-25 NOTE — Plan of Care (Signed)
  Problem: Clinical Measurements: Goal: Ability to maintain clinical measurements within normal limits will improve Outcome: Progressing   Problem: Health Behavior/Discharge Planning: Goal: Ability to manage health-related needs will improve Outcome: Progressing   Problem: Activity: Goal: Risk for activity intolerance will decrease Outcome: Progressing

## 2022-09-25 NOTE — Plan of Care (Signed)

## 2022-09-25 NOTE — Progress Notes (Signed)
PT Cancellation Note  Patient Details Name: Heidi Whitaker MRN: [id number] DOB: 11-Nov-1929   Cancelled Treatment:    Reason Eval/Treat Not Completed: Other (comment). Per EMR pt being placed in hospice care and expected to discharge today 09/25/22. PT to d/c orders due to change in medical status.    Salem Caster. Fairly IV, PT, DPT Physical Therapist- [facility]  09/25/2022, 10:49 AM

## 2022-09-25 NOTE — Progress Notes (Signed)
Manufacturing [facility]) Hospital Liaison Note  Bed has been offered today at [facility].  Patient/family has accepted bed offer.    Bedside RN call report to [place] [phone].   If patient has IV access, it can remain in place.  [name] Hilda Blades has been updated on plan of discharge.  Transport will be arranged for 8pm tonight, 9/29.    If family is not at bedside when transport arrives, please contact them to let them know transport is occurring.    [facility] Nurse Liaison

## 2022-09-25 NOTE — Discharge Summary (Signed)
Physician Discharge Summary   Patient: Heidi Whitaker MRN: [id number] DOB: 12/27/1929  Admit date:     09/18/2022  Discharge date: 09/25/22  Discharge Physician: Max Sane   PCP: McLean-Scocuzza, Nino Glow, MD   Recommendations at discharge:    Hospice Home  Discharge Diagnoses: Principal Problem:   Left femoral shaft fracture ([place]) Active Problems:   HTN (hypertension)   Type 2 diabetes mellitus with stage 3 chronic kidney disease, without long-term current use of insulin (HCC)   Hyperlipidemia   Vitamin D deficiency   Leukocytosis   Acute deep vein thrombosis (DVT) of distal vein of left lower extremity (HCC)   Stage 3a chronic kidney disease ([place])   Hypertension associated with diabetes ([place])   PAD (peripheral artery disease) (HCC)   Valvular heart disease   Acute on chronic systolic CHF (congestive heart failure) (HCC)   Chronic systolic CHF (congestive heart failure) ([place])   Colostomy in place [facility])   Persistent atrial fibrillation ([place])   History of stroke   Vascular dementia ([place])   Failure to thrive in adult   Obesity (BMI 30-39.9)   Dementia with behavioral disturbance [facility])   Hospice care patient   Hospital Course: Heidi Whitaker is a 88 year old female with history of non-insulin-dependent diabetes mellitus, hypertension, hyperlipidemia, persistent atrial fibrillation on Eliquis and digoxin, history of stroke, history of DVT, chronic diarrhea, status post colostomy secondary to benign tumors, who presents emergency department for chief concerns of unwitnessed mechanical fall. Left hip with and without pelvis x-ray was read as: Severely displaced and comminuted proximal left femoral shaft fracture. Surgery on 9/25. Postoperatively, patient developed some volume overload, increased confusion and agitation. Discharged to nursing home when mental status improves.  May consider hospice if patient does not improve with mental status or does not eat.  9/27:  TOC working with family for placement to SNF likely tomorrow 9/28: Family pursuing hospice now.  Waiting for hospice home screening  Assessment and Plan: * Left femoral shaft fracture ([place]) - Secondary to mechanical fall - Status post intramedullary fixation of the left femur fracture POD #4  Dementia with behavioral disturbance (HCC) Obesity (BMI 30-39.9) Failure to thrive in adult Vascular dementia ([place]) Persistent atrial fibrillation (HCC) Colostomy in place [facility]) Stage 3a chronic kidney disease ([place]) Acute deep vein thrombosis (DVT) of distal vein of left lower extremity (HCC) Leukocytosis Hyperlipidemia Type 2 diabetes mellitus with stage 3 chronic kidney disease, without long-term current use of insulin (HCC) HTN (hypertension)        Consultants: Ortho Procedures performed: intramedullary fixation of the left femur fracture on 9/25  Disposition: Hospice care Diet recommendation:  Discharge Diet Orders (From admission, onward)     Start     Ordered   09/25/22 0000  Diet - low sodium heart healthy        09/25/22 1052           Carb modified diet DISCHARGE MEDICATION: Allergies as of 09/25/2022   No Known Allergies      Medication List     STOP taking these medications    apixaban 5 MG Tabs tablet Commonly known as: Eliquis   D3-1000 25 MCG (1000 UT) capsule Generic drug: Cholecalciferol   digoxin 0.125 MG tablet Commonly known as: LANOXIN   furosemide 40 MG tablet Commonly known as: LASIX   glipiZIDE 2.5 MG 24 hr tablet Commonly known as: GLUCOTROL XL   lovastatin 20 MG tablet Commonly known as: MEVACOR   metoprolol succinate 50  MG 24 hr tablet Commonly known as: TOPROL-XL   potassium chloride 10 MEQ tablet Commonly known as: Klor-Con M10       TAKE these medications    LORazepam 0.5 MG tablet Commonly known as: Ativan Take 1 tablet (0.5 mg total) by mouth every 8 (eight) hours as needed for up to 3 days for anxiety or sleep.    morphine CONCENTRATE 10 mg / 0.5 ml concentrated solution Take 0.25 mLs (5 mg total) by mouth every 2 (two) hours as needed for up to 3 days for severe pain, anxiety, shortness of breath or moderate pain.        Contact information for after-discharge care     Destination     [facility] .   Service: Skilled Nursing Contact information: [address] [place] [phone]                    Discharge Exam: Danley Danker Weights   09/18/22 1740 09/21/22 1151 09/21/22 1215  Weight: 80.1 kg 80.1 kg 78.60 kg   88 year old female lying in the bed comfortably without any acute distress - seem to be actively dying Lungs clear to auscultation bilaterally Cardiovascular irregular irregular heart rhythm Abdomen soft, benign Incision scant drainage Neuro nonfocal  Condition at discharge: poor  The results of significant diagnostics from this hospitalization (including imaging, microbiology, ancillary and laboratory) are listed below for reference.   Imaging Studies: DG FEMUR MIN 2 VIEWS LEFT  Result Date: 09/21/2022 CLINICAL DATA:  Femur fracture, status post intramedullary nail EXAM: LEFT FEMUR 2 VIEWS COMPARISON:  09/18/2022 FINDINGS: Status post intramedullary nail fixation of comminuted fractures of the proximal left femoral metadiaphysis, with near anatomic fracture alignment. Expected overlying postoperative change. IMPRESSION: Status post intramedullary nail fixation of comminuted fractures of the proximal left femoral metadiaphysis, with near anatomic fracture alignment. Electronically Signed   By: Delanna Ahmadi M.D.   On: 09/21/2022 16:20   DG HIP UNILAT WITH PELVIS 2-3 VIEWS LEFT  Result Date: 09/21/2022 CLINICAL DATA:  Left IM nail EXAM: DG HIP (WITH OR WITHOUT PELVIS) 2-3V LEFT COMPARISON:  Hip radiograph 09/18/2022 FINDINGS: Intraoperative images during left femur intramedullary nailing. Improved fracture  alignment without evidence of immediate complication. Vascular stent noted. IMPRESSION: Intraoperative images during left femur intramedullary nailing. Improved, near anatomic fracture alignment. No evidence of immediate hardware complication. Electronically Signed   By: Maurine Simmering M.D.   On: 09/21/2022 15:39   DG C-Arm 1-60 Min-No Report  Result Date: 09/21/2022 Fluoroscopy was utilized by the requesting physician.  No radiographic interpretation.   DG Chest Port 1 View  Result Date: 09/18/2022 CLINICAL DATA:  Preop chest. EXAM: PORTABLE CHEST 1 VIEW COMPARISON:  One-view chest x-ray 10/31/2021 FINDINGS: Atherosclerotic changes are present at the aortic arch. Heart is mildly enlarged, exaggerated by low lung volumes. Lungs are clear. No edema or effusion is present. Right shoulder hemiarthroplasty noted. IMPRESSION: No acute cardiopulmonary disease. Electronically Signed   By: San Morelle M.D.   On: 09/18/2022 14:39   DG Hip Unilat W or Wo Pelvis 2-3 Views Left  Result Date: 09/18/2022 CLINICAL DATA:  Unwitnessed fall. EXAM: DG HIP (WITH OR WITHOUT PELVIS) 2-3V LEFT COMPARISON:  None Available. FINDINGS: Severely displaced and comminuted fracture is seen involving the proximal left femoral shaft. No significant abnormality seen involving the hip joints. IMPRESSION: Severely displaced and comminuted proximal left femoral shaft fracture. Electronically Signed   By: Bobbe Medico.D.  On: 09/18/2022 12:41   DG Knee 2 Views Left  Result Date: 09/18/2022 CLINICAL DATA:  Left knee pain after unwitnessed fall. EXAM: LEFT KNEE - 1-2 VIEW COMPARISON:  None Available. FINDINGS: Status post left total knee arthroplasty. No definite fracture or dislocation is noted. Vascular calcifications are noted. IMPRESSION: No acute abnormality seen. Electronically Signed   By: Marijo Conception M.D.   On: 09/18/2022 12:40    Microbiology: Results for orders placed or performed during the hospital encounter  of 10/31/21  Resp Panel by RT-PCR (Flu A&B, Covid) Nasopharyngeal Swab     Status: None   Collection Time: 10/31/21 11:12 AM   Specimen: Nasopharyngeal Swab; Nasopharyngeal(NP) swabs in vial transport medium  Result Value Ref Range Status   SARS Coronavirus 2 by RT PCR NEGATIVE NEGATIVE Final    Comment: (NOTE) SARS-CoV-2 target nucleic acids are NOT DETECTED.  The SARS-CoV-2 RNA is generally detectable in upper respiratory specimens during the acute phase of infection. The lowest concentration of SARS-CoV-2 viral copies this assay can detect is 138 copies/mL. A negative result does not preclude SARS-Cov-2 infection and should not be used as the sole basis for treatment or other patient management decisions. A negative result may occur with  improper specimen collection/handling, submission of specimen other than nasopharyngeal swab, presence of viral mutation(s) within the areas targeted by this assay, and inadequate number of viral copies(<138 copies/mL). A negative result must be combined with clinical observations, patient history, and epidemiological information. The expected result is Negative.  Fact Sheet for Patients:  EntrepreneurPulse.com.au  Fact Sheet for Healthcare Providers:  IncredibleEmployment.be  This test is no t yet approved or cleared by the [place] FDA and  has been authorized for detection and/or diagnosis of SARS-CoV-2 by FDA under an Emergency Use Authorization (EUA). This EUA will remain  in effect (meaning this test can be used) for the duration of the COVID-19 declaration under Section 564(b)(1) of the Act, 21 U.S.C.section 360bbb-3(b)(1), unless the authorization is terminated  or revoked sooner.       Influenza A by PCR NEGATIVE NEGATIVE Final   Influenza B by PCR NEGATIVE NEGATIVE Final    Comment: (NOTE) The Xpert Xpress SARS-CoV-2/FLU/RSV plus assay is intended as an aid in the diagnosis of influenza  from Nasopharyngeal swab specimens and should not be used as a sole basis for treatment. Nasal washings and aspirates are unacceptable for Xpert Xpress SARS-CoV-2/FLU/RSV testing.  Fact Sheet for Patients: EntrepreneurPulse.com.au  Fact Sheet for Healthcare Providers: IncredibleEmployment.be  This test is not yet approved or cleared by the [place] FDA and has been authorized for detection and/or diagnosis of SARS-CoV-2 by FDA under an Emergency Use Authorization (EUA). This EUA will remain in effect (meaning this test can be used) for the duration of the COVID-19 declaration under Section 564(b)(1) of the Act, 21 U.S.C. section 360bbb-3(b)(1), unless the authorization is terminated or revoked.  Performed at [facility], [address] [place], [place] [address]     Labs: CBC: Recent Labs  Lab 09/18/22 1133 09/19/22 0552 09/22/22 0356 09/23/22 0512 09/24/22 0928  WBC 14.2* 15.8* 14.3* 16.1* 18.2*  NEUTROABS 8.4*  --   --   --   --   HGB 14.9 12.9 8.5* 7.5* 8.3*  HCT 46.0 39.7 25.8* 22.4* 25.9*  MCV 95.0 94.5 95.6 94.5 96.6  PLT 242 215 176 178 169   Basic Metabolic Panel: Recent Labs  Lab 09/18/22 1133 09/19/22 0552 09/22/22 0356 09/23/22 0512  NA  138 137 136 137  K 3.9 4.3 4.3 3.9  CL 100 102 101 103  CO2 '27 26 28 27  '$ GLUCOSE 249* 256* 234* 199*  BUN 17 18 29* 29*  CREATININE 1.16* 1.12* 1.03* 0.96  CALCIUM 9.7 9.4 8.4* 8.2*  MG  --  2.2  --  2.2   Liver Function Tests: No results for input(s): "AST", "ALT", "ALKPHOS", "BILITOT", "PROT", "ALBUMIN" in the last 168 hours. CBG: Recent Labs  Lab 09/21/22 1519  GLUCAP 205*    Discharge time spent: greater than 30 minutes.  Signed: Max Sane, MD [facility] 09/25/2022

## 2022-09-25 NOTE — Progress Notes (Addendum)
1256 Report given to stephanie in hospice all questions and concerns answered.  EMS called. Will update her daughter.  1300 Left message with daughter Jackelyn Poling

## 2022-09-26 ENCOUNTER — Other Ambulatory Visit: Payer: Self-pay | Admitting: Cardiovascular Disease

## 2022-09-28 ENCOUNTER — Telehealth: Payer: Self-pay | Admitting: *Deleted

## 2022-09-28 NOTE — Telephone Encounter (Signed)
I spoke with Heidi Whitaker pt's daughter to verify if pt is taking Potassium due to medication not being on pt's medication list.  Pt's daughter confirmed that her mother is currently in hospice care and is currently not taking any of her cardiac medications at this point due to her being in comfort care only.  Pt's daughter would like for our office to contact pharmacy to give verbal order to d/c all cardiac medications due to pt being in hospice care and no longer taking medications to avoid accruing cost for refills that she is no longer taking.  Please advise.

## 2022-09-28 NOTE — Telephone Encounter (Signed)
To Dr. [name] to advise.

## 2022-09-30 ENCOUNTER — Telehealth: Payer: Self-pay | Admitting: Internal Medicine

## 2022-09-30 NOTE — Telephone Encounter (Signed)
Her mom was going to be your [facility] pt she was my patient   Heidi Whitaker is your pt I already called and expressed your condolences today to her   She stopped eating after fall breaking bones and having hallucinations stopped eating and on hospice

## 2022-09-30 NOTE — Telephone Encounter (Signed)
Thank you :)

## 2022-10-13 ENCOUNTER — Ambulatory Visit: Payer: Medicare HMO | Admitting: Internal Medicine

## 2022-10-28 NOTE — Telephone Encounter (Signed)
Called and spoke with pharmacy. We reviewed current medications on file in their system. They went through her list and reviewed all cardiac medications. Requested that they please remove those from their system because patient is on comfort care. She verbalized understanding and no further needs.   Minna Merritts, MD  Emily Filbert, RN 1 hour ago (8:28 AM)    If she is on hospice we can certainly cancel all of her tach medications that are not needed  Potassium should not be needed if she is not on the Lasix  I do not see any medications listed on her chart  If needed we can call the pharmacy  Typically there should not be a charge for medications if the family does not pick them up  Thx  TG

## 2022-10-28 DEATH — deceased
